# Patient Record
Sex: Female | Born: 1945 | Race: White | Hispanic: No | Marital: Single | Smoking: Never smoker
Health system: Southern US, Community
[De-identification: ages and names within clinical notes are randomized; demographics above are authoritative.]

## PROBLEM LIST (undated history)

## (undated) DIAGNOSIS — H5440 Blindness, one eye, unspecified eye: Secondary | ICD-10-CM

## (undated) DIAGNOSIS — Z973 Presence of spectacles and contact lenses: Secondary | ICD-10-CM

## (undated) DIAGNOSIS — J40 Bronchitis, not specified as acute or chronic: Secondary | ICD-10-CM

## (undated) DIAGNOSIS — E119 Type 2 diabetes mellitus without complications: Secondary | ICD-10-CM

## (undated) DIAGNOSIS — Z9889 Other specified postprocedural states: Secondary | ICD-10-CM

## (undated) DIAGNOSIS — Z9989 Dependence on other enabling machines and devices: Secondary | ICD-10-CM

## (undated) DIAGNOSIS — R112 Nausea with vomiting, unspecified: Secondary | ICD-10-CM

## (undated) DIAGNOSIS — H409 Unspecified glaucoma: Secondary | ICD-10-CM

## (undated) DIAGNOSIS — N3281 Overactive bladder: Secondary | ICD-10-CM

## (undated) DIAGNOSIS — Z87442 Personal history of urinary calculi: Secondary | ICD-10-CM

## (undated) DIAGNOSIS — G4733 Obstructive sleep apnea (adult) (pediatric): Secondary | ICD-10-CM

## (undated) DIAGNOSIS — K219 Gastro-esophageal reflux disease without esophagitis: Secondary | ICD-10-CM

## (undated) DIAGNOSIS — M810 Age-related osteoporosis without current pathological fracture: Secondary | ICD-10-CM

## (undated) DIAGNOSIS — I10 Essential (primary) hypertension: Secondary | ICD-10-CM

## (undated) DIAGNOSIS — Z8489 Family history of other specified conditions: Secondary | ICD-10-CM

## (undated) DIAGNOSIS — M1990 Unspecified osteoarthritis, unspecified site: Secondary | ICD-10-CM

## (undated) HISTORY — DX: Gastro-esophageal reflux disease without esophagitis: K21.9

## (undated) HISTORY — PX: CHOLECYSTECTOMY: SHX55

## (undated) HISTORY — DX: Essential (primary) hypertension: I10

## (undated) HISTORY — DX: Age-related osteoporosis without current pathological fracture: M81.0

## (undated) HISTORY — PX: TONSILLECTOMY: SUR1361

---

## 1990-04-02 DIAGNOSIS — H5440 Blindness, one eye, unspecified eye: Secondary | ICD-10-CM

## 1990-04-02 HISTORY — PX: EYE SURGERY: SHX253

## 1990-04-02 HISTORY — DX: Blindness, one eye, unspecified eye: H54.40

## 1997-11-19 ENCOUNTER — Encounter: Payer: Self-pay | Admitting: Family Medicine

## 1997-11-19 ENCOUNTER — Ambulatory Visit (HOSPITAL_COMMUNITY): Admission: RE | Admit: 1997-11-19 | Discharge: 1997-11-19 | Payer: Self-pay | Admitting: Family Medicine

## 1999-11-16 ENCOUNTER — Ambulatory Visit (HOSPITAL_COMMUNITY): Admission: RE | Admit: 1999-11-16 | Discharge: 1999-11-16 | Payer: Self-pay | Admitting: Family Medicine

## 1999-11-16 ENCOUNTER — Encounter: Payer: Self-pay | Admitting: Family Medicine

## 2001-03-03 ENCOUNTER — Encounter: Payer: Self-pay | Admitting: Family Medicine

## 2001-03-03 ENCOUNTER — Ambulatory Visit (HOSPITAL_COMMUNITY): Admission: RE | Admit: 2001-03-03 | Discharge: 2001-03-03 | Payer: Self-pay | Admitting: Family Medicine

## 2003-06-30 ENCOUNTER — Other Ambulatory Visit: Admission: RE | Admit: 2003-06-30 | Discharge: 2003-06-30 | Payer: Self-pay | Admitting: Family Medicine

## 2003-07-15 ENCOUNTER — Ambulatory Visit (HOSPITAL_COMMUNITY): Admission: RE | Admit: 2003-07-15 | Discharge: 2003-07-15 | Payer: Self-pay | Admitting: Family Medicine

## 2003-11-26 ENCOUNTER — Ambulatory Visit (HOSPITAL_COMMUNITY): Admission: RE | Admit: 2003-11-26 | Discharge: 2003-11-26 | Payer: Self-pay | Admitting: *Deleted

## 2003-11-26 ENCOUNTER — Encounter (INDEPENDENT_AMBULATORY_CARE_PROVIDER_SITE_OTHER): Payer: Self-pay | Admitting: Specialist

## 2005-07-10 ENCOUNTER — Other Ambulatory Visit: Admission: RE | Admit: 2005-07-10 | Discharge: 2005-07-10 | Payer: Self-pay | Admitting: Family Medicine

## 2005-08-23 ENCOUNTER — Ambulatory Visit (HOSPITAL_COMMUNITY): Admission: RE | Admit: 2005-08-23 | Discharge: 2005-08-23 | Payer: Self-pay | Admitting: Family Medicine

## 2006-01-03 ENCOUNTER — Encounter: Admission: RE | Admit: 2006-01-03 | Discharge: 2006-04-03 | Payer: Self-pay | Admitting: Family Medicine

## 2006-09-06 ENCOUNTER — Other Ambulatory Visit: Admission: RE | Admit: 2006-09-06 | Discharge: 2006-09-06 | Payer: Self-pay | Admitting: Family Medicine

## 2006-09-10 ENCOUNTER — Ambulatory Visit (HOSPITAL_COMMUNITY): Admission: RE | Admit: 2006-09-10 | Discharge: 2006-09-10 | Payer: Self-pay | Admitting: Family Medicine

## 2007-09-15 ENCOUNTER — Other Ambulatory Visit: Admission: RE | Admit: 2007-09-15 | Discharge: 2007-09-15 | Payer: Self-pay | Admitting: Family Medicine

## 2007-09-30 ENCOUNTER — Ambulatory Visit (HOSPITAL_COMMUNITY): Admission: RE | Admit: 2007-09-30 | Discharge: 2007-09-30 | Payer: Self-pay | Admitting: Family Medicine

## 2007-12-29 ENCOUNTER — Encounter: Admission: RE | Admit: 2007-12-29 | Discharge: 2008-03-01 | Payer: Self-pay | Admitting: Orthopedic Surgery

## 2008-11-24 ENCOUNTER — Ambulatory Visit (HOSPITAL_COMMUNITY): Admission: RE | Admit: 2008-11-24 | Discharge: 2008-11-24 | Payer: Self-pay | Admitting: Family Medicine

## 2010-01-05 ENCOUNTER — Ambulatory Visit (HOSPITAL_COMMUNITY): Admission: RE | Admit: 2010-01-05 | Discharge: 2010-01-05 | Payer: Self-pay | Admitting: Family Medicine

## 2010-03-17 ENCOUNTER — Encounter
Admission: RE | Admit: 2010-03-17 | Discharge: 2010-03-30 | Payer: Self-pay | Source: Home / Self Care | Attending: Family Medicine | Admitting: Family Medicine

## 2010-04-02 ENCOUNTER — Encounter
Admission: RE | Admit: 2010-04-02 | Discharge: 2010-05-02 | Payer: Self-pay | Source: Home / Self Care | Attending: Family Medicine | Admitting: Family Medicine

## 2010-04-17 ENCOUNTER — Encounter: Admit: 2010-04-17 | Payer: Self-pay | Admitting: Family Medicine

## 2010-09-22 ENCOUNTER — Emergency Department (HOSPITAL_COMMUNITY): Payer: No Typology Code available for payment source

## 2010-09-22 ENCOUNTER — Emergency Department (HOSPITAL_COMMUNITY)
Admission: EM | Admit: 2010-09-22 | Discharge: 2010-09-22 | Disposition: A | Discharge status: Home / Self Care | Payer: No Typology Code available for payment source | Attending: Emergency Medicine | Admitting: Emergency Medicine

## 2010-09-22 DIAGNOSIS — E7800 Pure hypercholesterolemia, unspecified: Secondary | ICD-10-CM | POA: Insufficient documentation

## 2010-09-22 DIAGNOSIS — W010XXA Fall on same level from slipping, tripping and stumbling without subsequent striking against object, initial encounter: Secondary | ICD-10-CM | POA: Insufficient documentation

## 2010-09-22 DIAGNOSIS — M25519 Pain in unspecified shoulder: Secondary | ICD-10-CM | POA: Insufficient documentation

## 2010-09-22 DIAGNOSIS — E119 Type 2 diabetes mellitus without complications: Secondary | ICD-10-CM | POA: Insufficient documentation

## 2010-09-22 DIAGNOSIS — S42209A Unspecified fracture of upper end of unspecified humerus, initial encounter for closed fracture: Secondary | ICD-10-CM | POA: Insufficient documentation

## 2010-09-22 DIAGNOSIS — Y9229 Other specified public building as the place of occurrence of the external cause: Secondary | ICD-10-CM | POA: Insufficient documentation

## 2010-11-06 ENCOUNTER — Ambulatory Visit: Payer: Medicare Other | Attending: Orthopedic Surgery | Admitting: Physical Therapy

## 2010-11-06 DIAGNOSIS — M25519 Pain in unspecified shoulder: Secondary | ICD-10-CM | POA: Insufficient documentation

## 2010-11-06 DIAGNOSIS — IMO0001 Reserved for inherently not codable concepts without codable children: Secondary | ICD-10-CM | POA: Insufficient documentation

## 2010-11-06 DIAGNOSIS — M25619 Stiffness of unspecified shoulder, not elsewhere classified: Secondary | ICD-10-CM | POA: Insufficient documentation

## 2010-11-14 ENCOUNTER — Encounter: Payer: MEDICARE | Admitting: Physical Therapy

## 2010-11-16 ENCOUNTER — Ambulatory Visit: Payer: Medicare Other | Admitting: Physical Therapy

## 2010-11-23 ENCOUNTER — Ambulatory Visit: Payer: Medicare Other | Admitting: Physical Therapy

## 2010-12-05 ENCOUNTER — Ambulatory Visit: Payer: No Typology Code available for payment source | Attending: Orthopedic Surgery | Admitting: Physical Therapy

## 2010-12-05 DIAGNOSIS — IMO0001 Reserved for inherently not codable concepts without codable children: Secondary | ICD-10-CM | POA: Insufficient documentation

## 2010-12-05 DIAGNOSIS — M25519 Pain in unspecified shoulder: Secondary | ICD-10-CM | POA: Insufficient documentation

## 2010-12-05 DIAGNOSIS — M25619 Stiffness of unspecified shoulder, not elsewhere classified: Secondary | ICD-10-CM | POA: Insufficient documentation

## 2010-12-07 ENCOUNTER — Ambulatory Visit: Payer: No Typology Code available for payment source | Admitting: Physical Therapy

## 2010-12-11 ENCOUNTER — Ambulatory Visit: Payer: No Typology Code available for payment source | Admitting: Physical Therapy

## 2010-12-13 ENCOUNTER — Ambulatory Visit: Payer: No Typology Code available for payment source | Admitting: Physical Therapy

## 2010-12-14 ENCOUNTER — Encounter: Payer: MEDICARE | Admitting: Physical Therapy

## 2010-12-15 ENCOUNTER — Ambulatory Visit: Payer: No Typology Code available for payment source | Admitting: Physical Therapy

## 2010-12-18 ENCOUNTER — Ambulatory Visit: Payer: No Typology Code available for payment source | Admitting: Physical Therapy

## 2010-12-19 ENCOUNTER — Encounter: Payer: MEDICARE | Admitting: Physical Therapy

## 2010-12-21 ENCOUNTER — Ambulatory Visit: Payer: No Typology Code available for payment source | Admitting: Physical Therapy

## 2010-12-25 ENCOUNTER — Ambulatory Visit: Payer: No Typology Code available for payment source | Admitting: Physical Therapy

## 2010-12-26 ENCOUNTER — Ambulatory Visit: Payer: No Typology Code available for payment source | Admitting: Physical Therapy

## 2010-12-28 ENCOUNTER — Ambulatory Visit: Payer: No Typology Code available for payment source | Admitting: Physical Therapy

## 2011-01-01 ENCOUNTER — Ambulatory Visit: Payer: Medicare Other | Attending: Orthopedic Surgery | Admitting: Physical Therapy

## 2011-01-01 DIAGNOSIS — IMO0001 Reserved for inherently not codable concepts without codable children: Secondary | ICD-10-CM | POA: Insufficient documentation

## 2011-01-01 DIAGNOSIS — M25619 Stiffness of unspecified shoulder, not elsewhere classified: Secondary | ICD-10-CM | POA: Insufficient documentation

## 2011-01-01 DIAGNOSIS — M25519 Pain in unspecified shoulder: Secondary | ICD-10-CM | POA: Insufficient documentation

## 2011-01-02 ENCOUNTER — Encounter: Payer: MEDICARE | Admitting: Physical Therapy

## 2011-01-08 ENCOUNTER — Ambulatory Visit: Payer: Medicare Other | Admitting: Physical Therapy

## 2011-01-10 ENCOUNTER — Ambulatory Visit: Payer: Medicare Other | Admitting: Physical Therapy

## 2011-01-12 ENCOUNTER — Ambulatory Visit: Payer: Medicare Other | Admitting: Physical Therapy

## 2011-01-15 ENCOUNTER — Ambulatory Visit: Payer: Medicare Other | Admitting: Physical Therapy

## 2011-01-17 ENCOUNTER — Ambulatory Visit: Payer: Medicare Other | Admitting: Physical Therapy

## 2011-01-18 ENCOUNTER — Ambulatory Visit: Payer: Medicare Other | Admitting: Physical Therapy

## 2011-01-22 ENCOUNTER — Ambulatory Visit: Payer: Medicare Other | Admitting: Physical Therapy

## 2011-01-24 ENCOUNTER — Ambulatory Visit: Payer: Medicare Other | Admitting: Physical Therapy

## 2011-01-25 ENCOUNTER — Ambulatory Visit: Payer: Medicare Other | Admitting: Physical Therapy

## 2011-02-04 DIAGNOSIS — H35101 Retinopathy of prematurity, unspecified, right eye: Secondary | ICD-10-CM | POA: Insufficient documentation

## 2011-02-04 DIAGNOSIS — Z970 Presence of artificial eye: Secondary | ICD-10-CM | POA: Insufficient documentation

## 2011-08-10 ENCOUNTER — Other Ambulatory Visit (HOSPITAL_COMMUNITY): Payer: Self-pay | Admitting: Family Medicine

## 2011-08-10 DIAGNOSIS — Z1231 Encounter for screening mammogram for malignant neoplasm of breast: Secondary | ICD-10-CM

## 2011-08-16 ENCOUNTER — Ambulatory Visit (HOSPITAL_COMMUNITY)
Admission: RE | Admit: 2011-08-16 | Discharge: 2011-08-16 | Disposition: A | Discharge status: Home / Self Care | Payer: Medicare Other | Source: Ambulatory Visit | Attending: Family Medicine | Admitting: Family Medicine

## 2011-08-16 DIAGNOSIS — Z1231 Encounter for screening mammogram for malignant neoplasm of breast: Secondary | ICD-10-CM

## 2012-05-25 ENCOUNTER — Ambulatory Visit (INDEPENDENT_AMBULATORY_CARE_PROVIDER_SITE_OTHER): Payer: Medicare Other | Admitting: Emergency Medicine

## 2012-05-25 VITALS — BP 127/80 | HR 109 | Temp 100.6°F | Resp 18 | Ht 62.0 in | Wt 163.8 lb

## 2012-05-25 DIAGNOSIS — J209 Acute bronchitis, unspecified: Secondary | ICD-10-CM

## 2012-05-25 MED ORDER — AZITHROMYCIN 250 MG PO TABS: ORAL_TABLET | ORAL | Status: DC

## 2012-05-25 MED ORDER — HYDROCOD POLST-CHLORPHEN POLST 10-8 MG/5ML PO LQCR: 5.0000 mL | Freq: Two times a day (BID) | ORAL | Status: DC | PRN

## 2012-05-25 NOTE — Patient Instructions (Signed)

## 2012-05-25 NOTE — Progress Notes (Signed)
[facility] [address], [place] [address] [phone]  Date:  05/25/2012   Name:  Lindsey Baldwin   DOB:  [birth year]   MRN:  [id number]  PCP:  No primary provider on file.    Chief Complaint: Cough and Fever   History of Present Illness:  Lindsey Baldwin is a 67 y.o. very pleasant female patient who presents with the following:  Ill for two weeks with a cough.  Productive scant mucoid sputum.  Started with a "cold" with temp to 102.6 and sore throat.  Myalgias, arthralgias and fatigue.  Earlier this week had several days of diarrhea.  No improvement with OTC medications.  No nausea or vomiting.    There is no problem list on file for this patient.   Past Medical History  Diagnosis Date  . Arthritis   . GERD (gastroesophageal reflux disease)   . Diabetes mellitus without complication   . Glaucoma   . Hypertension   . Osteoporosis     Past Surgical History  Procedure Laterality Date  . Eye surgery      History  Substance Use Topics  . Smoking status: Never Smoker   . Smokeless tobacco: Not on file  . Alcohol Use: No    Family History  Problem Relation Age of Onset  . Cancer Mother   . Cancer Father     Allergies  Allergen Reactions  . Penicillins Swelling  . Sulfa Antibiotics Rash    Medication list has been reviewed and updated.  No current outpatient prescriptions on file prior to visit.   No current facility-administered medications on file prior to visit.    Review of Systems:  As per HPI, otherwise negative.    Physical Examination: Filed Vitals:   05/25/12 1344  BP: 127/80  Pulse: 109  Temp: 100.6 F (38.1 C)  Resp: 18   Filed Vitals:   05/25/12 1344  Height: 5\' 2"  (1.575 m)  Weight: 163 lb 12.8 oz (74.299 kg)   Body mass index is 29.95 kg/(m^2). Ideal Body Weight: Weight in (lb) to have BMI = 25: 136.4  GEN: WDWN, NAD, Non-toxic, A & O x 3  No rash HEENT: Atraumatic, Normocephalic. Neck supple. No  masses, No LAD. Ears and Nose: No external deformity. CV: RRR, No M/G/R. No JVD. No thrill. No extra heart sounds. PULM: CTA B, no wheezes, crackles, rhonchi. No retractions. No resp. distress. No accessory muscle use. ABD: S, NT, ND, +BS. No rebound. No HSM. EXTR: No c/c/e NEURO Normal gait.  PSYCH: Normally interactive. Conversant. Not depressed or anxious appearing.  Calm demeanor.    Assessment and Plan: Bronchitis zpak tussionex   Carmelina Dane, MD

## 2012-11-12 ENCOUNTER — Other Ambulatory Visit (HOSPITAL_COMMUNITY): Payer: Self-pay | Admitting: Family Medicine

## 2012-11-12 DIAGNOSIS — Z1231 Encounter for screening mammogram for malignant neoplasm of breast: Secondary | ICD-10-CM

## 2012-11-18 ENCOUNTER — Ambulatory Visit (HOSPITAL_COMMUNITY)
Admission: RE | Admit: 2012-11-18 | Discharge: 2012-11-18 | Disposition: A | Discharge status: Home / Self Care | Payer: MEDICARE | Source: Ambulatory Visit | Attending: Family Medicine | Admitting: Family Medicine

## 2012-11-18 DIAGNOSIS — Z1231 Encounter for screening mammogram for malignant neoplasm of breast: Secondary | ICD-10-CM | POA: Insufficient documentation

## 2014-09-23 ENCOUNTER — Other Ambulatory Visit (HOSPITAL_COMMUNITY): Payer: Self-pay | Admitting: Family Medicine

## 2014-09-23 DIAGNOSIS — Z1231 Encounter for screening mammogram for malignant neoplasm of breast: Secondary | ICD-10-CM

## 2014-10-01 ENCOUNTER — Ambulatory Visit (HOSPITAL_COMMUNITY)
Admission: RE | Admit: 2014-10-01 | Discharge: 2014-10-01 | Disposition: A | Discharge status: Home / Self Care | Payer: Medicare Other | Source: Ambulatory Visit | Attending: Family Medicine | Admitting: Family Medicine

## 2014-10-01 DIAGNOSIS — Z1231 Encounter for screening mammogram for malignant neoplasm of breast: Secondary | ICD-10-CM

## 2015-01-18 ENCOUNTER — Other Ambulatory Visit (HOSPITAL_COMMUNITY)
Admission: RE | Admit: 2015-01-18 | Discharge: 2015-01-18 | Disposition: A | Discharge status: Home / Self Care | Payer: Medicare Other | Source: Ambulatory Visit | Attending: Family Medicine | Admitting: Family Medicine

## 2015-01-18 ENCOUNTER — Other Ambulatory Visit: Payer: Self-pay | Admitting: Family Medicine

## 2015-01-18 DIAGNOSIS — Z1151 Encounter for screening for human papillomavirus (HPV): Secondary | ICD-10-CM | POA: Insufficient documentation

## 2015-01-18 DIAGNOSIS — Z01419 Encounter for gynecological examination (general) (routine) without abnormal findings: Secondary | ICD-10-CM | POA: Insufficient documentation

## 2015-01-19 LAB — CYTOLOGY - PAP

## 2015-05-10 DIAGNOSIS — M5136 Other intervertebral disc degeneration, lumbar region: Secondary | ICD-10-CM | POA: Diagnosis not present

## 2015-05-16 DIAGNOSIS — L821 Other seborrheic keratosis: Secondary | ICD-10-CM | POA: Diagnosis not present

## 2015-05-16 DIAGNOSIS — D485 Neoplasm of uncertain behavior of skin: Secondary | ICD-10-CM | POA: Diagnosis not present

## 2015-05-16 DIAGNOSIS — L814 Other melanin hyperpigmentation: Secondary | ICD-10-CM | POA: Diagnosis not present

## 2015-05-16 DIAGNOSIS — D225 Melanocytic nevi of trunk: Secondary | ICD-10-CM | POA: Diagnosis not present

## 2015-05-16 DIAGNOSIS — D1801 Hemangioma of skin and subcutaneous tissue: Secondary | ICD-10-CM | POA: Diagnosis not present

## 2015-05-16 DIAGNOSIS — C44319 Basal cell carcinoma of skin of other parts of face: Secondary | ICD-10-CM | POA: Diagnosis not present

## 2015-05-16 DIAGNOSIS — Z85828 Personal history of other malignant neoplasm of skin: Secondary | ICD-10-CM | POA: Diagnosis not present

## 2015-05-17 DIAGNOSIS — J209 Acute bronchitis, unspecified: Secondary | ICD-10-CM | POA: Diagnosis not present

## 2015-07-19 DIAGNOSIS — Z7984 Long term (current) use of oral hypoglycemic drugs: Secondary | ICD-10-CM | POA: Diagnosis not present

## 2015-07-19 DIAGNOSIS — E7800 Pure hypercholesterolemia, unspecified: Secondary | ICD-10-CM | POA: Diagnosis not present

## 2015-07-19 DIAGNOSIS — E1140 Type 2 diabetes mellitus with diabetic neuropathy, unspecified: Secondary | ICD-10-CM | POA: Diagnosis not present

## 2015-08-30 DIAGNOSIS — C44319 Basal cell carcinoma of skin of other parts of face: Secondary | ICD-10-CM | POA: Diagnosis not present

## 2015-09-30 DIAGNOSIS — E1140 Type 2 diabetes mellitus with diabetic neuropathy, unspecified: Secondary | ICD-10-CM | POA: Diagnosis not present

## 2015-09-30 DIAGNOSIS — M1990 Unspecified osteoarthritis, unspecified site: Secondary | ICD-10-CM | POA: Diagnosis not present

## 2015-09-30 DIAGNOSIS — Z7984 Long term (current) use of oral hypoglycemic drugs: Secondary | ICD-10-CM | POA: Diagnosis not present

## 2015-09-30 DIAGNOSIS — E11319 Type 2 diabetes mellitus with unspecified diabetic retinopathy without macular edema: Secondary | ICD-10-CM | POA: Diagnosis not present

## 2015-09-30 DIAGNOSIS — R269 Unspecified abnormalities of gait and mobility: Secondary | ICD-10-CM | POA: Diagnosis not present

## 2015-10-11 DIAGNOSIS — M25562 Pain in left knee: Secondary | ICD-10-CM | POA: Diagnosis not present

## 2015-10-11 DIAGNOSIS — M25561 Pain in right knee: Secondary | ICD-10-CM | POA: Diagnosis not present

## 2015-10-11 DIAGNOSIS — M5136 Other intervertebral disc degeneration, lumbar region: Secondary | ICD-10-CM | POA: Diagnosis not present

## 2015-10-20 DIAGNOSIS — Z4420 Encounter for fitting and adjustment of artificial eye, unspecified: Secondary | ICD-10-CM | POA: Diagnosis not present

## 2015-10-31 DIAGNOSIS — H401113 Primary open-angle glaucoma, right eye, severe stage: Secondary | ICD-10-CM | POA: Diagnosis not present

## 2015-11-01 DIAGNOSIS — R262 Difficulty in walking, not elsewhere classified: Secondary | ICD-10-CM | POA: Diagnosis not present

## 2015-11-01 DIAGNOSIS — M7989 Other specified soft tissue disorders: Secondary | ICD-10-CM | POA: Diagnosis not present

## 2015-12-06 DIAGNOSIS — E1140 Type 2 diabetes mellitus with diabetic neuropathy, unspecified: Secondary | ICD-10-CM | POA: Diagnosis not present

## 2015-12-06 DIAGNOSIS — M1990 Unspecified osteoarthritis, unspecified site: Secondary | ICD-10-CM | POA: Diagnosis not present

## 2015-12-06 DIAGNOSIS — R269 Unspecified abnormalities of gait and mobility: Secondary | ICD-10-CM | POA: Diagnosis not present

## 2015-12-06 DIAGNOSIS — E11319 Type 2 diabetes mellitus with unspecified diabetic retinopathy without macular edema: Secondary | ICD-10-CM | POA: Diagnosis not present

## 2015-12-06 DIAGNOSIS — Z7982 Long term (current) use of aspirin: Secondary | ICD-10-CM | POA: Diagnosis not present

## 2015-12-16 DIAGNOSIS — R3915 Urgency of urination: Secondary | ICD-10-CM | POA: Diagnosis not present

## 2015-12-16 DIAGNOSIS — R351 Nocturia: Secondary | ICD-10-CM | POA: Diagnosis not present

## 2016-01-06 DIAGNOSIS — H401113 Primary open-angle glaucoma, right eye, severe stage: Secondary | ICD-10-CM | POA: Diagnosis not present

## 2016-01-09 DIAGNOSIS — M170 Bilateral primary osteoarthritis of knee: Secondary | ICD-10-CM | POA: Diagnosis not present

## 2016-01-09 DIAGNOSIS — R600 Localized edema: Secondary | ICD-10-CM | POA: Diagnosis not present

## 2016-01-09 DIAGNOSIS — R400 Somnolence: Secondary | ICD-10-CM | POA: Diagnosis not present

## 2016-01-09 DIAGNOSIS — R351 Nocturia: Secondary | ICD-10-CM | POA: Diagnosis not present

## 2016-01-09 DIAGNOSIS — Z23 Encounter for immunization: Secondary | ICD-10-CM | POA: Diagnosis not present

## 2016-01-23 DIAGNOSIS — G4733 Obstructive sleep apnea (adult) (pediatric): Secondary | ICD-10-CM | POA: Diagnosis not present

## 2016-01-24 DIAGNOSIS — E1165 Type 2 diabetes mellitus with hyperglycemia: Secondary | ICD-10-CM | POA: Diagnosis not present

## 2016-01-24 DIAGNOSIS — N3281 Overactive bladder: Secondary | ICD-10-CM | POA: Diagnosis not present

## 2016-01-24 DIAGNOSIS — K219 Gastro-esophageal reflux disease without esophagitis: Secondary | ICD-10-CM | POA: Diagnosis not present

## 2016-01-24 DIAGNOSIS — M810 Age-related osteoporosis without current pathological fracture: Secondary | ICD-10-CM | POA: Diagnosis not present

## 2016-01-24 DIAGNOSIS — E7800 Pure hypercholesterolemia, unspecified: Secondary | ICD-10-CM | POA: Diagnosis not present

## 2016-01-24 DIAGNOSIS — E1140 Type 2 diabetes mellitus with diabetic neuropathy, unspecified: Secondary | ICD-10-CM | POA: Diagnosis not present

## 2016-01-24 DIAGNOSIS — Z1389 Encounter for screening for other disorder: Secondary | ICD-10-CM | POA: Diagnosis not present

## 2016-01-24 DIAGNOSIS — R600 Localized edema: Secondary | ICD-10-CM | POA: Diagnosis not present

## 2016-01-24 DIAGNOSIS — Z0000 Encounter for general adult medical examination without abnormal findings: Secondary | ICD-10-CM | POA: Diagnosis not present

## 2016-01-28 DIAGNOSIS — I509 Heart failure, unspecified: Secondary | ICD-10-CM | POA: Diagnosis not present

## 2016-01-28 DIAGNOSIS — M1991 Primary osteoarthritis, unspecified site: Secondary | ICD-10-CM | POA: Diagnosis not present

## 2016-01-28 DIAGNOSIS — I110 Hypertensive heart disease with heart failure: Secondary | ICD-10-CM | POA: Diagnosis not present

## 2016-01-28 DIAGNOSIS — M810 Age-related osteoporosis without current pathological fracture: Secondary | ICD-10-CM | POA: Diagnosis not present

## 2016-01-31 DIAGNOSIS — E1140 Type 2 diabetes mellitus with diabetic neuropathy, unspecified: Secondary | ICD-10-CM | POA: Diagnosis not present

## 2016-01-31 DIAGNOSIS — I509 Heart failure, unspecified: Secondary | ICD-10-CM | POA: Diagnosis not present

## 2016-01-31 DIAGNOSIS — M1991 Primary osteoarthritis, unspecified site: Secondary | ICD-10-CM | POA: Diagnosis not present

## 2016-01-31 DIAGNOSIS — M810 Age-related osteoporosis without current pathological fracture: Secondary | ICD-10-CM | POA: Diagnosis not present

## 2016-01-31 DIAGNOSIS — I110 Hypertensive heart disease with heart failure: Secondary | ICD-10-CM | POA: Diagnosis not present

## 2016-02-11 DIAGNOSIS — G4733 Obstructive sleep apnea (adult) (pediatric): Secondary | ICD-10-CM | POA: Diagnosis not present

## 2016-02-24 DIAGNOSIS — M170 Bilateral primary osteoarthritis of knee: Secondary | ICD-10-CM | POA: Diagnosis not present

## 2016-02-24 DIAGNOSIS — R262 Difficulty in walking, not elsewhere classified: Secondary | ICD-10-CM | POA: Diagnosis not present

## 2016-03-05 DIAGNOSIS — H401113 Primary open-angle glaucoma, right eye, severe stage: Secondary | ICD-10-CM | POA: Diagnosis not present

## 2016-03-05 DIAGNOSIS — Z970 Presence of artificial eye: Secondary | ICD-10-CM | POA: Diagnosis not present

## 2016-03-12 DIAGNOSIS — G4733 Obstructive sleep apnea (adult) (pediatric): Secondary | ICD-10-CM | POA: Diagnosis not present

## 2016-03-13 DIAGNOSIS — Z4420 Encounter for fitting and adjustment of artificial eye, unspecified: Secondary | ICD-10-CM | POA: Diagnosis not present

## 2016-04-06 DIAGNOSIS — R351 Nocturia: Secondary | ICD-10-CM | POA: Diagnosis not present

## 2016-04-06 DIAGNOSIS — R3915 Urgency of urination: Secondary | ICD-10-CM | POA: Diagnosis not present

## 2016-04-12 DIAGNOSIS — G4733 Obstructive sleep apnea (adult) (pediatric): Secondary | ICD-10-CM | POA: Diagnosis not present

## 2016-05-08 DIAGNOSIS — H02834 Dermatochalasis of left upper eyelid: Secondary | ICD-10-CM | POA: Diagnosis not present

## 2016-05-08 DIAGNOSIS — H53481 Generalized contraction of visual field, right eye: Secondary | ICD-10-CM | POA: Diagnosis not present

## 2016-05-08 DIAGNOSIS — H02849 Edema of unspecified eye, unspecified eyelid: Secondary | ICD-10-CM | POA: Insufficient documentation

## 2016-05-08 DIAGNOSIS — H02409 Unspecified ptosis of unspecified eyelid: Secondary | ICD-10-CM | POA: Insufficient documentation

## 2016-05-08 DIAGNOSIS — H02831 Dermatochalasis of right upper eyelid: Secondary | ICD-10-CM | POA: Diagnosis not present

## 2016-05-08 DIAGNOSIS — H02413 Mechanical ptosis of bilateral eyelids: Secondary | ICD-10-CM | POA: Diagnosis not present

## 2016-06-04 DIAGNOSIS — K5900 Constipation, unspecified: Secondary | ICD-10-CM | POA: Diagnosis not present

## 2016-06-04 DIAGNOSIS — K602 Anal fissure, unspecified: Secondary | ICD-10-CM | POA: Diagnosis not present

## 2016-06-04 DIAGNOSIS — K625 Hemorrhage of anus and rectum: Secondary | ICD-10-CM | POA: Diagnosis not present

## 2016-06-10 DIAGNOSIS — G4733 Obstructive sleep apnea (adult) (pediatric): Secondary | ICD-10-CM | POA: Diagnosis not present

## 2016-07-03 DIAGNOSIS — H02423 Myogenic ptosis of bilateral eyelids: Secondary | ICD-10-CM | POA: Diagnosis not present

## 2016-07-03 DIAGNOSIS — H02413 Mechanical ptosis of bilateral eyelids: Secondary | ICD-10-CM | POA: Diagnosis not present

## 2016-07-24 DIAGNOSIS — E1140 Type 2 diabetes mellitus with diabetic neuropathy, unspecified: Secondary | ICD-10-CM | POA: Diagnosis not present

## 2016-07-24 DIAGNOSIS — R600 Localized edema: Secondary | ICD-10-CM | POA: Diagnosis not present

## 2016-07-24 DIAGNOSIS — E7800 Pure hypercholesterolemia, unspecified: Secondary | ICD-10-CM | POA: Diagnosis not present

## 2016-08-02 DIAGNOSIS — B078 Other viral warts: Secondary | ICD-10-CM | POA: Diagnosis not present

## 2016-08-13 DIAGNOSIS — H401113 Primary open-angle glaucoma, right eye, severe stage: Secondary | ICD-10-CM | POA: Diagnosis not present

## 2016-08-13 DIAGNOSIS — H35101 Retinopathy of prematurity, unspecified, right eye: Secondary | ICD-10-CM | POA: Diagnosis not present

## 2016-09-07 DIAGNOSIS — G4733 Obstructive sleep apnea (adult) (pediatric): Secondary | ICD-10-CM | POA: Diagnosis not present

## 2016-09-13 DIAGNOSIS — G8929 Other chronic pain: Secondary | ICD-10-CM | POA: Diagnosis not present

## 2016-09-13 DIAGNOSIS — M25561 Pain in right knee: Secondary | ICD-10-CM | POA: Diagnosis not present

## 2016-09-13 DIAGNOSIS — M25562 Pain in left knee: Secondary | ICD-10-CM | POA: Diagnosis not present

## 2016-09-19 DIAGNOSIS — M6281 Muscle weakness (generalized): Secondary | ICD-10-CM | POA: Diagnosis not present

## 2016-09-19 DIAGNOSIS — N3946 Mixed incontinence: Secondary | ICD-10-CM | POA: Diagnosis not present

## 2016-09-19 DIAGNOSIS — M25511 Pain in right shoulder: Secondary | ICD-10-CM | POA: Diagnosis not present

## 2016-09-19 DIAGNOSIS — R2689 Other abnormalities of gait and mobility: Secondary | ICD-10-CM | POA: Diagnosis not present

## 2016-09-20 DIAGNOSIS — R2689 Other abnormalities of gait and mobility: Secondary | ICD-10-CM | POA: Diagnosis not present

## 2016-09-20 DIAGNOSIS — M25511 Pain in right shoulder: Secondary | ICD-10-CM | POA: Diagnosis not present

## 2016-09-20 DIAGNOSIS — M6281 Muscle weakness (generalized): Secondary | ICD-10-CM | POA: Diagnosis not present

## 2016-09-20 DIAGNOSIS — N3946 Mixed incontinence: Secondary | ICD-10-CM | POA: Diagnosis not present

## 2016-09-24 DIAGNOSIS — M25511 Pain in right shoulder: Secondary | ICD-10-CM | POA: Diagnosis not present

## 2016-09-24 DIAGNOSIS — M6281 Muscle weakness (generalized): Secondary | ICD-10-CM | POA: Diagnosis not present

## 2016-09-24 DIAGNOSIS — N3946 Mixed incontinence: Secondary | ICD-10-CM | POA: Diagnosis not present

## 2016-09-24 DIAGNOSIS — R2689 Other abnormalities of gait and mobility: Secondary | ICD-10-CM | POA: Diagnosis not present

## 2016-09-25 DIAGNOSIS — M6281 Muscle weakness (generalized): Secondary | ICD-10-CM | POA: Diagnosis not present

## 2016-09-25 DIAGNOSIS — N3946 Mixed incontinence: Secondary | ICD-10-CM | POA: Diagnosis not present

## 2016-09-25 DIAGNOSIS — M25511 Pain in right shoulder: Secondary | ICD-10-CM | POA: Diagnosis not present

## 2016-09-25 DIAGNOSIS — R2689 Other abnormalities of gait and mobility: Secondary | ICD-10-CM | POA: Diagnosis not present

## 2016-09-26 DIAGNOSIS — M6281 Muscle weakness (generalized): Secondary | ICD-10-CM | POA: Diagnosis not present

## 2016-09-26 DIAGNOSIS — N3946 Mixed incontinence: Secondary | ICD-10-CM | POA: Diagnosis not present

## 2016-09-26 DIAGNOSIS — M25511 Pain in right shoulder: Secondary | ICD-10-CM | POA: Diagnosis not present

## 2016-09-26 DIAGNOSIS — R2689 Other abnormalities of gait and mobility: Secondary | ICD-10-CM | POA: Diagnosis not present

## 2016-09-27 DIAGNOSIS — N3946 Mixed incontinence: Secondary | ICD-10-CM | POA: Diagnosis not present

## 2016-09-27 DIAGNOSIS — M6281 Muscle weakness (generalized): Secondary | ICD-10-CM | POA: Diagnosis not present

## 2016-09-27 DIAGNOSIS — R2689 Other abnormalities of gait and mobility: Secondary | ICD-10-CM | POA: Diagnosis not present

## 2016-09-27 DIAGNOSIS — M25511 Pain in right shoulder: Secondary | ICD-10-CM | POA: Diagnosis not present

## 2016-09-28 DIAGNOSIS — N3946 Mixed incontinence: Secondary | ICD-10-CM | POA: Diagnosis not present

## 2016-09-28 DIAGNOSIS — R2689 Other abnormalities of gait and mobility: Secondary | ICD-10-CM | POA: Diagnosis not present

## 2016-09-28 DIAGNOSIS — M6281 Muscle weakness (generalized): Secondary | ICD-10-CM | POA: Diagnosis not present

## 2016-09-28 DIAGNOSIS — M25511 Pain in right shoulder: Secondary | ICD-10-CM | POA: Diagnosis not present

## 2016-10-01 DIAGNOSIS — M25511 Pain in right shoulder: Secondary | ICD-10-CM | POA: Diagnosis not present

## 2016-10-01 DIAGNOSIS — R2689 Other abnormalities of gait and mobility: Secondary | ICD-10-CM | POA: Diagnosis not present

## 2016-10-01 DIAGNOSIS — M6281 Muscle weakness (generalized): Secondary | ICD-10-CM | POA: Diagnosis not present

## 2016-10-01 DIAGNOSIS — N3946 Mixed incontinence: Secondary | ICD-10-CM | POA: Diagnosis not present

## 2016-10-02 DIAGNOSIS — M25511 Pain in right shoulder: Secondary | ICD-10-CM | POA: Diagnosis not present

## 2016-10-02 DIAGNOSIS — N3946 Mixed incontinence: Secondary | ICD-10-CM | POA: Diagnosis not present

## 2016-10-02 DIAGNOSIS — R2689 Other abnormalities of gait and mobility: Secondary | ICD-10-CM | POA: Diagnosis not present

## 2016-10-02 DIAGNOSIS — M6281 Muscle weakness (generalized): Secondary | ICD-10-CM | POA: Diagnosis not present

## 2016-10-04 DIAGNOSIS — M6281 Muscle weakness (generalized): Secondary | ICD-10-CM | POA: Diagnosis not present

## 2016-10-04 DIAGNOSIS — M25511 Pain in right shoulder: Secondary | ICD-10-CM | POA: Diagnosis not present

## 2016-10-04 DIAGNOSIS — N3946 Mixed incontinence: Secondary | ICD-10-CM | POA: Diagnosis not present

## 2016-10-04 DIAGNOSIS — R2689 Other abnormalities of gait and mobility: Secondary | ICD-10-CM | POA: Diagnosis not present

## 2016-10-05 DIAGNOSIS — M25511 Pain in right shoulder: Secondary | ICD-10-CM | POA: Diagnosis not present

## 2016-10-05 DIAGNOSIS — M6281 Muscle weakness (generalized): Secondary | ICD-10-CM | POA: Diagnosis not present

## 2016-10-05 DIAGNOSIS — N3946 Mixed incontinence: Secondary | ICD-10-CM | POA: Diagnosis not present

## 2016-10-05 DIAGNOSIS — R2689 Other abnormalities of gait and mobility: Secondary | ICD-10-CM | POA: Diagnosis not present

## 2016-10-08 DIAGNOSIS — R2689 Other abnormalities of gait and mobility: Secondary | ICD-10-CM | POA: Diagnosis not present

## 2016-10-08 DIAGNOSIS — M25511 Pain in right shoulder: Secondary | ICD-10-CM | POA: Diagnosis not present

## 2016-10-08 DIAGNOSIS — M6281 Muscle weakness (generalized): Secondary | ICD-10-CM | POA: Diagnosis not present

## 2016-10-08 DIAGNOSIS — N3946 Mixed incontinence: Secondary | ICD-10-CM | POA: Diagnosis not present

## 2016-10-10 DIAGNOSIS — R2689 Other abnormalities of gait and mobility: Secondary | ICD-10-CM | POA: Diagnosis not present

## 2016-10-10 DIAGNOSIS — M6281 Muscle weakness (generalized): Secondary | ICD-10-CM | POA: Diagnosis not present

## 2016-10-10 DIAGNOSIS — N3946 Mixed incontinence: Secondary | ICD-10-CM | POA: Diagnosis not present

## 2016-10-10 DIAGNOSIS — M25511 Pain in right shoulder: Secondary | ICD-10-CM | POA: Diagnosis not present

## 2016-10-12 DIAGNOSIS — M6281 Muscle weakness (generalized): Secondary | ICD-10-CM | POA: Diagnosis not present

## 2016-10-12 DIAGNOSIS — M25511 Pain in right shoulder: Secondary | ICD-10-CM | POA: Diagnosis not present

## 2016-10-12 DIAGNOSIS — R2689 Other abnormalities of gait and mobility: Secondary | ICD-10-CM | POA: Diagnosis not present

## 2016-10-12 DIAGNOSIS — N3946 Mixed incontinence: Secondary | ICD-10-CM | POA: Diagnosis not present

## 2016-10-15 DIAGNOSIS — N3946 Mixed incontinence: Secondary | ICD-10-CM | POA: Diagnosis not present

## 2016-10-15 DIAGNOSIS — M25511 Pain in right shoulder: Secondary | ICD-10-CM | POA: Diagnosis not present

## 2016-10-15 DIAGNOSIS — M6281 Muscle weakness (generalized): Secondary | ICD-10-CM | POA: Diagnosis not present

## 2016-10-15 DIAGNOSIS — R2689 Other abnormalities of gait and mobility: Secondary | ICD-10-CM | POA: Diagnosis not present

## 2016-10-16 DIAGNOSIS — M6281 Muscle weakness (generalized): Secondary | ICD-10-CM | POA: Diagnosis not present

## 2016-10-16 DIAGNOSIS — M25511 Pain in right shoulder: Secondary | ICD-10-CM | POA: Diagnosis not present

## 2016-10-16 DIAGNOSIS — N3946 Mixed incontinence: Secondary | ICD-10-CM | POA: Diagnosis not present

## 2016-10-16 DIAGNOSIS — R2689 Other abnormalities of gait and mobility: Secondary | ICD-10-CM | POA: Diagnosis not present

## 2016-10-17 DIAGNOSIS — N3946 Mixed incontinence: Secondary | ICD-10-CM | POA: Diagnosis not present

## 2016-10-17 DIAGNOSIS — R2689 Other abnormalities of gait and mobility: Secondary | ICD-10-CM | POA: Diagnosis not present

## 2016-10-17 DIAGNOSIS — M25511 Pain in right shoulder: Secondary | ICD-10-CM | POA: Diagnosis not present

## 2016-10-17 DIAGNOSIS — M6281 Muscle weakness (generalized): Secondary | ICD-10-CM | POA: Diagnosis not present

## 2016-10-18 DIAGNOSIS — R2689 Other abnormalities of gait and mobility: Secondary | ICD-10-CM | POA: Diagnosis not present

## 2016-10-18 DIAGNOSIS — M25511 Pain in right shoulder: Secondary | ICD-10-CM | POA: Diagnosis not present

## 2016-10-18 DIAGNOSIS — N3946 Mixed incontinence: Secondary | ICD-10-CM | POA: Diagnosis not present

## 2016-10-18 DIAGNOSIS — M6281 Muscle weakness (generalized): Secondary | ICD-10-CM | POA: Diagnosis not present

## 2016-10-19 DIAGNOSIS — N3946 Mixed incontinence: Secondary | ICD-10-CM | POA: Diagnosis not present

## 2016-10-19 DIAGNOSIS — R2689 Other abnormalities of gait and mobility: Secondary | ICD-10-CM | POA: Diagnosis not present

## 2016-10-19 DIAGNOSIS — M6281 Muscle weakness (generalized): Secondary | ICD-10-CM | POA: Diagnosis not present

## 2016-10-19 DIAGNOSIS — M25511 Pain in right shoulder: Secondary | ICD-10-CM | POA: Diagnosis not present

## 2016-10-22 DIAGNOSIS — N3946 Mixed incontinence: Secondary | ICD-10-CM | POA: Diagnosis not present

## 2016-10-22 DIAGNOSIS — M25511 Pain in right shoulder: Secondary | ICD-10-CM | POA: Diagnosis not present

## 2016-10-22 DIAGNOSIS — R2689 Other abnormalities of gait and mobility: Secondary | ICD-10-CM | POA: Diagnosis not present

## 2016-10-22 DIAGNOSIS — M6281 Muscle weakness (generalized): Secondary | ICD-10-CM | POA: Diagnosis not present

## 2016-10-23 DIAGNOSIS — R2689 Other abnormalities of gait and mobility: Secondary | ICD-10-CM | POA: Diagnosis not present

## 2016-10-23 DIAGNOSIS — M6281 Muscle weakness (generalized): Secondary | ICD-10-CM | POA: Diagnosis not present

## 2016-10-23 DIAGNOSIS — M25511 Pain in right shoulder: Secondary | ICD-10-CM | POA: Diagnosis not present

## 2016-10-23 DIAGNOSIS — N3946 Mixed incontinence: Secondary | ICD-10-CM | POA: Diagnosis not present

## 2016-10-24 DIAGNOSIS — M6281 Muscle weakness (generalized): Secondary | ICD-10-CM | POA: Diagnosis not present

## 2016-10-24 DIAGNOSIS — M25511 Pain in right shoulder: Secondary | ICD-10-CM | POA: Diagnosis not present

## 2016-10-24 DIAGNOSIS — R2689 Other abnormalities of gait and mobility: Secondary | ICD-10-CM | POA: Diagnosis not present

## 2016-10-24 DIAGNOSIS — N3946 Mixed incontinence: Secondary | ICD-10-CM | POA: Diagnosis not present

## 2016-10-25 DIAGNOSIS — N3946 Mixed incontinence: Secondary | ICD-10-CM | POA: Diagnosis not present

## 2016-10-25 DIAGNOSIS — R2689 Other abnormalities of gait and mobility: Secondary | ICD-10-CM | POA: Diagnosis not present

## 2016-10-25 DIAGNOSIS — M25511 Pain in right shoulder: Secondary | ICD-10-CM | POA: Diagnosis not present

## 2016-10-25 DIAGNOSIS — M6281 Muscle weakness (generalized): Secondary | ICD-10-CM | POA: Diagnosis not present

## 2016-10-29 DIAGNOSIS — N3946 Mixed incontinence: Secondary | ICD-10-CM | POA: Diagnosis not present

## 2016-10-29 DIAGNOSIS — M6281 Muscle weakness (generalized): Secondary | ICD-10-CM | POA: Diagnosis not present

## 2016-10-29 DIAGNOSIS — R2689 Other abnormalities of gait and mobility: Secondary | ICD-10-CM | POA: Diagnosis not present

## 2016-10-29 DIAGNOSIS — M25511 Pain in right shoulder: Secondary | ICD-10-CM | POA: Diagnosis not present

## 2016-10-30 DIAGNOSIS — M25511 Pain in right shoulder: Secondary | ICD-10-CM | POA: Diagnosis not present

## 2016-10-30 DIAGNOSIS — R2689 Other abnormalities of gait and mobility: Secondary | ICD-10-CM | POA: Diagnosis not present

## 2016-10-30 DIAGNOSIS — N3946 Mixed incontinence: Secondary | ICD-10-CM | POA: Diagnosis not present

## 2016-10-30 DIAGNOSIS — M6281 Muscle weakness (generalized): Secondary | ICD-10-CM | POA: Diagnosis not present

## 2016-10-31 DIAGNOSIS — M6281 Muscle weakness (generalized): Secondary | ICD-10-CM | POA: Diagnosis not present

## 2016-10-31 DIAGNOSIS — R2689 Other abnormalities of gait and mobility: Secondary | ICD-10-CM | POA: Diagnosis not present

## 2016-10-31 DIAGNOSIS — M25511 Pain in right shoulder: Secondary | ICD-10-CM | POA: Diagnosis not present

## 2016-10-31 DIAGNOSIS — N3946 Mixed incontinence: Secondary | ICD-10-CM | POA: Diagnosis not present

## 2016-11-05 DIAGNOSIS — R2689 Other abnormalities of gait and mobility: Secondary | ICD-10-CM | POA: Diagnosis not present

## 2016-11-05 DIAGNOSIS — N3946 Mixed incontinence: Secondary | ICD-10-CM | POA: Diagnosis not present

## 2016-11-05 DIAGNOSIS — M25511 Pain in right shoulder: Secondary | ICD-10-CM | POA: Diagnosis not present

## 2016-11-05 DIAGNOSIS — M6281 Muscle weakness (generalized): Secondary | ICD-10-CM | POA: Diagnosis not present

## 2016-11-06 DIAGNOSIS — Z7984 Long term (current) use of oral hypoglycemic drugs: Secondary | ICD-10-CM | POA: Diagnosis not present

## 2016-11-06 DIAGNOSIS — M25511 Pain in right shoulder: Secondary | ICD-10-CM | POA: Diagnosis not present

## 2016-11-06 DIAGNOSIS — R2689 Other abnormalities of gait and mobility: Secondary | ICD-10-CM | POA: Diagnosis not present

## 2016-11-06 DIAGNOSIS — E1165 Type 2 diabetes mellitus with hyperglycemia: Secondary | ICD-10-CM | POA: Diagnosis not present

## 2016-11-06 DIAGNOSIS — M6281 Muscle weakness (generalized): Secondary | ICD-10-CM | POA: Diagnosis not present

## 2016-11-06 DIAGNOSIS — N3946 Mixed incontinence: Secondary | ICD-10-CM | POA: Diagnosis not present

## 2016-11-06 DIAGNOSIS — Z1389 Encounter for screening for other disorder: Secondary | ICD-10-CM | POA: Diagnosis not present

## 2016-11-08 DIAGNOSIS — R3915 Urgency of urination: Secondary | ICD-10-CM | POA: Diagnosis not present

## 2016-11-08 DIAGNOSIS — R351 Nocturia: Secondary | ICD-10-CM | POA: Diagnosis not present

## 2016-11-09 DIAGNOSIS — R2689 Other abnormalities of gait and mobility: Secondary | ICD-10-CM | POA: Diagnosis not present

## 2016-11-09 DIAGNOSIS — M25511 Pain in right shoulder: Secondary | ICD-10-CM | POA: Diagnosis not present

## 2016-11-09 DIAGNOSIS — M6281 Muscle weakness (generalized): Secondary | ICD-10-CM | POA: Diagnosis not present

## 2016-11-09 DIAGNOSIS — N3946 Mixed incontinence: Secondary | ICD-10-CM | POA: Diagnosis not present

## 2016-11-19 DIAGNOSIS — N3946 Mixed incontinence: Secondary | ICD-10-CM | POA: Diagnosis not present

## 2016-11-19 DIAGNOSIS — M6281 Muscle weakness (generalized): Secondary | ICD-10-CM | POA: Diagnosis not present

## 2016-11-19 DIAGNOSIS — M25511 Pain in right shoulder: Secondary | ICD-10-CM | POA: Diagnosis not present

## 2016-11-19 DIAGNOSIS — R2689 Other abnormalities of gait and mobility: Secondary | ICD-10-CM | POA: Diagnosis not present

## 2016-11-20 DIAGNOSIS — N3946 Mixed incontinence: Secondary | ICD-10-CM | POA: Diagnosis not present

## 2016-11-20 DIAGNOSIS — M25511 Pain in right shoulder: Secondary | ICD-10-CM | POA: Diagnosis not present

## 2016-11-20 DIAGNOSIS — M6281 Muscle weakness (generalized): Secondary | ICD-10-CM | POA: Diagnosis not present

## 2016-11-20 DIAGNOSIS — R2689 Other abnormalities of gait and mobility: Secondary | ICD-10-CM | POA: Diagnosis not present

## 2016-11-21 DIAGNOSIS — M25511 Pain in right shoulder: Secondary | ICD-10-CM | POA: Diagnosis not present

## 2016-11-21 DIAGNOSIS — M6281 Muscle weakness (generalized): Secondary | ICD-10-CM | POA: Diagnosis not present

## 2016-11-21 DIAGNOSIS — N3946 Mixed incontinence: Secondary | ICD-10-CM | POA: Diagnosis not present

## 2016-11-21 DIAGNOSIS — R2689 Other abnormalities of gait and mobility: Secondary | ICD-10-CM | POA: Diagnosis not present

## 2016-11-22 DIAGNOSIS — N3946 Mixed incontinence: Secondary | ICD-10-CM | POA: Diagnosis not present

## 2016-11-22 DIAGNOSIS — M6281 Muscle weakness (generalized): Secondary | ICD-10-CM | POA: Diagnosis not present

## 2016-11-22 DIAGNOSIS — R2689 Other abnormalities of gait and mobility: Secondary | ICD-10-CM | POA: Diagnosis not present

## 2016-11-22 DIAGNOSIS — M25511 Pain in right shoulder: Secondary | ICD-10-CM | POA: Diagnosis not present

## 2016-11-23 DIAGNOSIS — N3946 Mixed incontinence: Secondary | ICD-10-CM | POA: Diagnosis not present

## 2016-11-23 DIAGNOSIS — M25511 Pain in right shoulder: Secondary | ICD-10-CM | POA: Diagnosis not present

## 2016-11-23 DIAGNOSIS — R2689 Other abnormalities of gait and mobility: Secondary | ICD-10-CM | POA: Diagnosis not present

## 2016-11-23 DIAGNOSIS — M6281 Muscle weakness (generalized): Secondary | ICD-10-CM | POA: Diagnosis not present

## 2016-11-26 DIAGNOSIS — N3946 Mixed incontinence: Secondary | ICD-10-CM | POA: Diagnosis not present

## 2016-11-26 DIAGNOSIS — R2689 Other abnormalities of gait and mobility: Secondary | ICD-10-CM | POA: Diagnosis not present

## 2016-11-26 DIAGNOSIS — M6281 Muscle weakness (generalized): Secondary | ICD-10-CM | POA: Diagnosis not present

## 2016-11-26 DIAGNOSIS — M25511 Pain in right shoulder: Secondary | ICD-10-CM | POA: Diagnosis not present

## 2016-11-28 DIAGNOSIS — N3946 Mixed incontinence: Secondary | ICD-10-CM | POA: Diagnosis not present

## 2016-11-28 DIAGNOSIS — R2689 Other abnormalities of gait and mobility: Secondary | ICD-10-CM | POA: Diagnosis not present

## 2016-11-28 DIAGNOSIS — M6281 Muscle weakness (generalized): Secondary | ICD-10-CM | POA: Diagnosis not present

## 2016-11-28 DIAGNOSIS — M25511 Pain in right shoulder: Secondary | ICD-10-CM | POA: Diagnosis not present

## 2016-11-29 DIAGNOSIS — M25511 Pain in right shoulder: Secondary | ICD-10-CM | POA: Diagnosis not present

## 2016-11-29 DIAGNOSIS — N3946 Mixed incontinence: Secondary | ICD-10-CM | POA: Diagnosis not present

## 2016-11-29 DIAGNOSIS — R2689 Other abnormalities of gait and mobility: Secondary | ICD-10-CM | POA: Diagnosis not present

## 2016-11-29 DIAGNOSIS — M6281 Muscle weakness (generalized): Secondary | ICD-10-CM | POA: Diagnosis not present

## 2016-12-04 DIAGNOSIS — M6281 Muscle weakness (generalized): Secondary | ICD-10-CM | POA: Diagnosis not present

## 2016-12-04 DIAGNOSIS — M25511 Pain in right shoulder: Secondary | ICD-10-CM | POA: Diagnosis not present

## 2016-12-04 DIAGNOSIS — N3946 Mixed incontinence: Secondary | ICD-10-CM | POA: Diagnosis not present

## 2016-12-04 DIAGNOSIS — R2689 Other abnormalities of gait and mobility: Secondary | ICD-10-CM | POA: Diagnosis not present

## 2016-12-10 DIAGNOSIS — M25511 Pain in right shoulder: Secondary | ICD-10-CM | POA: Diagnosis not present

## 2016-12-10 DIAGNOSIS — M6281 Muscle weakness (generalized): Secondary | ICD-10-CM | POA: Diagnosis not present

## 2016-12-10 DIAGNOSIS — R2689 Other abnormalities of gait and mobility: Secondary | ICD-10-CM | POA: Diagnosis not present

## 2016-12-10 DIAGNOSIS — N3946 Mixed incontinence: Secondary | ICD-10-CM | POA: Diagnosis not present

## 2016-12-12 DIAGNOSIS — N3946 Mixed incontinence: Secondary | ICD-10-CM | POA: Diagnosis not present

## 2016-12-12 DIAGNOSIS — R2689 Other abnormalities of gait and mobility: Secondary | ICD-10-CM | POA: Diagnosis not present

## 2016-12-12 DIAGNOSIS — M25511 Pain in right shoulder: Secondary | ICD-10-CM | POA: Diagnosis not present

## 2016-12-12 DIAGNOSIS — M6281 Muscle weakness (generalized): Secondary | ICD-10-CM | POA: Diagnosis not present

## 2016-12-17 DIAGNOSIS — H35101 Retinopathy of prematurity, unspecified, right eye: Secondary | ICD-10-CM | POA: Diagnosis not present

## 2016-12-17 DIAGNOSIS — H401113 Primary open-angle glaucoma, right eye, severe stage: Secondary | ICD-10-CM | POA: Diagnosis not present

## 2016-12-20 DIAGNOSIS — M25561 Pain in right knee: Secondary | ICD-10-CM | POA: Diagnosis not present

## 2016-12-20 DIAGNOSIS — G8929 Other chronic pain: Secondary | ICD-10-CM | POA: Diagnosis not present

## 2016-12-20 DIAGNOSIS — M1712 Unilateral primary osteoarthritis, left knee: Secondary | ICD-10-CM | POA: Diagnosis not present

## 2016-12-20 DIAGNOSIS — M1711 Unilateral primary osteoarthritis, right knee: Secondary | ICD-10-CM | POA: Diagnosis not present

## 2017-01-29 DIAGNOSIS — M810 Age-related osteoporosis without current pathological fracture: Secondary | ICD-10-CM | POA: Diagnosis not present

## 2017-01-29 DIAGNOSIS — Z0000 Encounter for general adult medical examination without abnormal findings: Secondary | ICD-10-CM | POA: Diagnosis not present

## 2017-01-29 DIAGNOSIS — E7800 Pure hypercholesterolemia, unspecified: Secondary | ICD-10-CM | POA: Diagnosis not present

## 2017-01-29 DIAGNOSIS — E1140 Type 2 diabetes mellitus with diabetic neuropathy, unspecified: Secondary | ICD-10-CM | POA: Diagnosis not present

## 2017-03-01 DIAGNOSIS — H401113 Primary open-angle glaucoma, right eye, severe stage: Secondary | ICD-10-CM | POA: Diagnosis not present

## 2017-05-01 DIAGNOSIS — M1711 Unilateral primary osteoarthritis, right knee: Secondary | ICD-10-CM | POA: Diagnosis not present

## 2017-05-01 DIAGNOSIS — M25561 Pain in right knee: Secondary | ICD-10-CM | POA: Diagnosis not present

## 2017-05-01 DIAGNOSIS — M25562 Pain in left knee: Secondary | ICD-10-CM | POA: Diagnosis not present

## 2017-05-02 DIAGNOSIS — G4733 Obstructive sleep apnea (adult) (pediatric): Secondary | ICD-10-CM | POA: Diagnosis not present

## 2017-06-04 DIAGNOSIS — M1712 Unilateral primary osteoarthritis, left knee: Secondary | ICD-10-CM | POA: Diagnosis not present

## 2017-06-04 DIAGNOSIS — M1711 Unilateral primary osteoarthritis, right knee: Secondary | ICD-10-CM | POA: Diagnosis not present

## 2017-06-04 DIAGNOSIS — M25562 Pain in left knee: Secondary | ICD-10-CM | POA: Diagnosis not present

## 2017-06-04 DIAGNOSIS — M170 Bilateral primary osteoarthritis of knee: Secondary | ICD-10-CM | POA: Diagnosis not present

## 2017-06-11 DIAGNOSIS — M170 Bilateral primary osteoarthritis of knee: Secondary | ICD-10-CM | POA: Diagnosis not present

## 2017-06-13 DIAGNOSIS — M810 Age-related osteoporosis without current pathological fracture: Secondary | ICD-10-CM | POA: Diagnosis not present

## 2017-06-17 DIAGNOSIS — G4733 Obstructive sleep apnea (adult) (pediatric): Secondary | ICD-10-CM | POA: Diagnosis not present

## 2017-06-18 DIAGNOSIS — M170 Bilateral primary osteoarthritis of knee: Secondary | ICD-10-CM | POA: Diagnosis not present

## 2017-06-18 DIAGNOSIS — M1712 Unilateral primary osteoarthritis, left knee: Secondary | ICD-10-CM | POA: Diagnosis not present

## 2017-06-18 DIAGNOSIS — M1711 Unilateral primary osteoarthritis, right knee: Secondary | ICD-10-CM | POA: Diagnosis not present

## 2017-06-25 DIAGNOSIS — H109 Unspecified conjunctivitis: Secondary | ICD-10-CM | POA: Diagnosis not present

## 2017-06-25 DIAGNOSIS — J069 Acute upper respiratory infection, unspecified: Secondary | ICD-10-CM | POA: Diagnosis not present

## 2017-07-06 ENCOUNTER — Emergency Department (HOSPITAL_BASED_OUTPATIENT_CLINIC_OR_DEPARTMENT_OTHER)
Admission: EM | Admit: 2017-07-06 | Discharge: 2017-07-06 | Disposition: A | Discharge status: Home / Self Care | Payer: Medicare Other | Attending: Emergency Medicine | Admitting: Emergency Medicine

## 2017-07-06 ENCOUNTER — Emergency Department (HOSPITAL_BASED_OUTPATIENT_CLINIC_OR_DEPARTMENT_OTHER): Payer: Medicare Other

## 2017-07-06 ENCOUNTER — Other Ambulatory Visit: Payer: Self-pay

## 2017-07-06 ENCOUNTER — Encounter (HOSPITAL_BASED_OUTPATIENT_CLINIC_OR_DEPARTMENT_OTHER): Payer: Self-pay | Admitting: *Deleted

## 2017-07-06 DIAGNOSIS — J209 Acute bronchitis, unspecified: Secondary | ICD-10-CM

## 2017-07-06 DIAGNOSIS — I10 Essential (primary) hypertension: Secondary | ICD-10-CM | POA: Diagnosis not present

## 2017-07-06 DIAGNOSIS — Z79899 Other long term (current) drug therapy: Secondary | ICD-10-CM | POA: Diagnosis not present

## 2017-07-06 DIAGNOSIS — E119 Type 2 diabetes mellitus without complications: Secondary | ICD-10-CM | POA: Insufficient documentation

## 2017-07-06 DIAGNOSIS — R05 Cough: Secondary | ICD-10-CM | POA: Diagnosis not present

## 2017-07-06 MED ORDER — HYDROCODONE-HOMATROPINE 5-1.5 MG/5ML PO SYRP: 5.0000 mL | ORAL_SOLUTION | Freq: Four times a day (QID) | ORAL | 0 refills | Status: DC | PRN

## 2017-07-06 MED ORDER — AZITHROMYCIN 250 MG PO TABS: ORAL_TABLET | ORAL | 0 refills | Status: DC

## 2017-07-06 MED ORDER — ALBUTEROL SULFATE HFA 108 (90 BASE) MCG/ACT IN AERS
2.0000 | INHALATION_SPRAY | Freq: Once | RESPIRATORY_TRACT | Status: AC
  Administered 2017-07-06: 2 via RESPIRATORY_TRACT
  Filled 2017-07-06: qty 6.7

## 2017-07-06 NOTE — ED Triage Notes (Signed)
Pt reports cough x 2 weeks, increasing in frequency, productive for small amount yellow sputum. States other people where she lives have similar symptoms. Pt ambulatory to triage with slow steady gait using her cane

## 2017-07-06 NOTE — ED Notes (Signed)
Pt and friend given d/c instructions as per chart. Rx x 2. Verbalizes understanding. No questions.

## 2017-07-06 NOTE — Discharge Instructions (Addendum)
1.  Use inhaler every 4-6 hours as needed for wheeze or cough.  You may use Hycodan syrup 1-2 teaspoons every 6 hours for severe cough. 2.  Follow-up with your doctor this week for recheck. 3.  Return to the emergency department if symptoms worsen or change.

## 2017-07-06 NOTE — ED Notes (Signed)
Prod cough x 2 weeks. Pt states she is coughing up a small amt of white sputum at times. Recently treated for pink eye.

## 2017-07-06 NOTE — ED Provider Notes (Signed)
[place] EMERGENCY DEPARTMENT Provider Note   CSN: [id number] Arrival date & time: 07/06/17  1931     History   Chief Complaint Chief Complaint  Patient presents with  . Cough    HPI Lindsey Baldwin is a 72 y.o. female.  HPI Patient has had 2 weeks of cough.  Has been gradually worsening.  Patient reports is productive of some sputum.  She gets short of breath with coughing paroxysmal.  No fever no chest pain.  Symptoms started with upper respiratory symptoms.  Patient reports she gets out of breath with coughing.  No lower extremity swelling or calf pain.  Patient reports she typically gets something like this once a year.  Once it starts is very hard for her to get rid of.  Patient goes to adult daycare and many of the other residents have had persistent respiratory illness. Past Medical History:  Diagnosis Date  . Arthritis   . Diabetes mellitus without complication ([place])   . GERD (gastroesophageal reflux disease)   . Glaucoma   . Hypertension   . Osteoporosis     There are no active problems to display for this patient.   Past Surgical History:  Procedure Laterality Date  . EYE SURGERY       OB History   None      Home Medications    Prior to Admission medications   Medication Sig Start Date End Date Taking? Authorizing Provider  alendronate (FOSAMAX) 70 MG tablet Take 70 mg by mouth every 7 (seven) days. Take with a full glass of water on an empty stomach.   Yes [provider]  aspirin 81 MG tablet Take 81 mg by mouth daily.   Yes [provider]  atorvastatin (LIPITOR) 40 MG tablet Take 40 mg by mouth daily.   Yes [provider]  enalapril (VASOTEC) 2.5 MG tablet Take 2.5 mg by mouth daily.   Yes [provider]  metFORMIN (GLUCOPHAGE) 500 MG tablet Take 500 mg by mouth 2 (two) times daily.   Yes [provider]  Nystatin-Triamcinolone (MYTREX EX) Apply topically.   Yes [provider]    Travoprost, BAK Free, (TRAVATAN) 0.004 % SOLN ophthalmic solution 1 drop at bedtime.   Yes [provider]  azithromycin (ZITHROMAX Z-PAK) 250 MG tablet 2 po day one, then 1 daily x 4 days 07/06/17   Charlesetta Shanks, MD  azithromycin (ZITHROMAX) 250 MG tablet Take 2 tabs PO x 1 dose, then 1 tab PO QD x 4 days 05/25/12   Roselee Culver, MD  chlorpheniramine-HYDROcodone [facility] PENNKINETIC ER) 10-8 MG/5ML [facility] Take 5 mLs by mouth every 12 (twelve) hours as needed (cough). 05/25/12   Roselee Culver, MD  HYDROcodone-homatropine [facility]) 5-1.5 MG/5ML syrup Take 5 mLs by mouth every 6 (six) hours as needed for cough. 07/06/17   Charlesetta Shanks, MD  lansoprazole (PREVACID) 15 MG capsule Take 15 mg by mouth daily.    [provider]    Family History Family History  Problem Relation Age of Onset  . Cancer Mother   . Cancer Father     Social History Social History   Tobacco Use  . Smoking status: Never Smoker  . Smokeless tobacco: Never Used  Substance Use Topics  . Alcohol use: No  . Drug use: No     Allergies   Penicillins and Sulfa antibiotics   Review of Systems Review of Systems 10 Systems reviewed and are negative for acute change  except as noted in the HPI.   Physical Exam Updated Vital Signs BP (!) 148/72 (BP Location: Left Arm)   Pulse 85   Temp 98.3 F (36.8 C) (Oral)   Resp 20   Ht 5\' 2"  (1.575 m)   Wt 81.6 kg (180 lb)   SpO2 95%   BMI 32.92 kg/m   Physical Exam  Constitutional: She is oriented to person, place, and time. She appears well-developed and well-nourished.  HENT:  Head: Normocephalic and atraumatic.  Nose: Nose normal.  Mouth/Throat: Oropharynx is clear and moist.  Eyes: Pupils are equal, round, and reactive to light. EOM are normal.  Neck: Neck supple.  Cardiovascular: Normal rate, regular rhythm, normal heart sounds and intact distal pulses.  Pulmonary/Chest: Effort normal and breath sounds normal.  Abdominal:  Soft. Bowel sounds are normal. She exhibits no distension. There is no tenderness.  Musculoskeletal: Normal range of motion. She exhibits no edema.  Neurological: She is alert and oriented to person, place, and time. She has normal strength. No cranial nerve deficit. She exhibits normal muscle tone. Coordination normal. GCS eye subscore is 4. GCS verbal subscore is 5. GCS motor subscore is 6.  Skin: Skin is warm, dry and intact.  Psychiatric: She has a normal mood and affect.     ED Treatments / Results  Labs (all labs ordered are listed, but only abnormal results are displayed) Labs Reviewed - No data to display  EKG None  Radiology Dg Chest 2 View  Result Date: 07/06/2017 CLINICAL DATA:  Coughing congestion for 2 weeks EXAM: CHEST - 2 VIEW COMPARISON:  None. FINDINGS: The lungs are clear without focal pneumonia, edema, pneumothorax or pleural effusion. The cardiopericardial silhouette is within normal limits for size. The visualized bony structures of the thorax are intact. IMPRESSION: No active cardiopulmonary disease. Electronically Signed   By: Misty Stanley M.D.   On: 07/06/2017 20:18    Procedures Procedures (including critical care time)  Medications Ordered in ED Medications  albuterol (PROVENTIL HFA;VENTOLIN HFA) 108 (90 Base) MCG/ACT inhaler 2 puff (2 puffs Inhalation Given 07/06/17 2130)     Initial Impression / Assessment and Plan / ED Course  I have reviewed the triage vital signs and the nursing notes.  Pertinent labs & imaging results that were available during my care of the patient were reviewed by me and considered in my medical decision making (see chart for details).      Final Clinical Impressions(s) / ED Diagnoses   Final diagnoses:  Acute bronchitis, unspecified organism  Presents with persistent cough over 2 weeks.  Patient is getting dyspneic with cough.  Cough has become productive.  At this time will trial a course of Zithromax, Hycodan and  albuterol.  Patient does not have chest pain, mental status change, fever or generalized weakness.  At this  time I feel she is stable for outpatient treatment.  Return precautions are reviewed.  ED Discharge Orders        Ordered    azithromycin (ZITHROMAX Z-PAK) 250 MG tablet     07/06/17 2115    HYDROcodone-homatropine (HYCODAN) 5-1.5 MG/5ML syrup  Every 6 hours PRN     07/06/17 2115       Charlesetta Shanks, MD 07/06/17 2143

## 2017-07-11 DIAGNOSIS — J209 Acute bronchitis, unspecified: Secondary | ICD-10-CM | POA: Diagnosis not present

## 2017-07-30 DIAGNOSIS — E1140 Type 2 diabetes mellitus with diabetic neuropathy, unspecified: Secondary | ICD-10-CM | POA: Diagnosis not present

## 2017-07-30 DIAGNOSIS — E7800 Pure hypercholesterolemia, unspecified: Secondary | ICD-10-CM | POA: Diagnosis not present

## 2017-07-30 DIAGNOSIS — R0789 Other chest pain: Secondary | ICD-10-CM | POA: Diagnosis not present

## 2017-07-30 DIAGNOSIS — D179 Benign lipomatous neoplasm, unspecified: Secondary | ICD-10-CM | POA: Diagnosis not present

## 2017-08-01 DIAGNOSIS — M1712 Unilateral primary osteoarthritis, left knee: Secondary | ICD-10-CM | POA: Diagnosis not present

## 2017-08-01 DIAGNOSIS — M1711 Unilateral primary osteoarthritis, right knee: Secondary | ICD-10-CM | POA: Diagnosis not present

## 2017-08-14 DIAGNOSIS — M25562 Pain in left knee: Secondary | ICD-10-CM | POA: Diagnosis not present

## 2017-08-14 DIAGNOSIS — M1712 Unilateral primary osteoarthritis, left knee: Secondary | ICD-10-CM | POA: Diagnosis not present

## 2017-08-14 DIAGNOSIS — M1711 Unilateral primary osteoarthritis, right knee: Secondary | ICD-10-CM | POA: Diagnosis not present

## 2017-08-14 DIAGNOSIS — M25561 Pain in right knee: Secondary | ICD-10-CM | POA: Diagnosis not present

## 2017-08-15 ENCOUNTER — Other Ambulatory Visit: Payer: Self-pay | Admitting: Surgery

## 2017-08-15 DIAGNOSIS — R221 Localized swelling, mass and lump, neck: Secondary | ICD-10-CM | POA: Diagnosis not present

## 2017-08-20 ENCOUNTER — Other Ambulatory Visit: Payer: Self-pay | Admitting: Family Medicine

## 2017-08-20 DIAGNOSIS — Z1231 Encounter for screening mammogram for malignant neoplasm of breast: Secondary | ICD-10-CM

## 2017-08-31 HISTORY — PX: CYST EXCISION: SHX5701

## 2017-09-11 ENCOUNTER — Ambulatory Visit
Admission: RE | Admit: 2017-09-11 | Discharge: 2017-09-11 | Disposition: A | Discharge status: Home / Self Care | Payer: Medicare Other | Source: Ambulatory Visit | Attending: Family Medicine | Admitting: Family Medicine

## 2017-09-11 DIAGNOSIS — Z1231 Encounter for screening mammogram for malignant neoplasm of breast: Secondary | ICD-10-CM

## 2017-09-30 DIAGNOSIS — D171 Benign lipomatous neoplasm of skin and subcutaneous tissue of trunk: Secondary | ICD-10-CM | POA: Diagnosis not present

## 2017-09-30 DIAGNOSIS — D170 Benign lipomatous neoplasm of skin and subcutaneous tissue of head, face and neck: Secondary | ICD-10-CM | POA: Diagnosis not present

## 2017-09-30 DIAGNOSIS — R221 Localized swelling, mass and lump, neck: Secondary | ICD-10-CM | POA: Diagnosis not present

## 2017-10-04 DIAGNOSIS — G4733 Obstructive sleep apnea (adult) (pediatric): Secondary | ICD-10-CM | POA: Diagnosis not present

## 2017-10-14 DIAGNOSIS — M25561 Pain in right knee: Secondary | ICD-10-CM | POA: Diagnosis not present

## 2017-10-14 DIAGNOSIS — R2681 Unsteadiness on feet: Secondary | ICD-10-CM | POA: Diagnosis not present

## 2017-10-14 DIAGNOSIS — M6281 Muscle weakness (generalized): Secondary | ICD-10-CM | POA: Diagnosis not present

## 2017-10-17 DIAGNOSIS — M25561 Pain in right knee: Secondary | ICD-10-CM | POA: Diagnosis not present

## 2017-10-17 DIAGNOSIS — R2681 Unsteadiness on feet: Secondary | ICD-10-CM | POA: Diagnosis not present

## 2017-10-17 DIAGNOSIS — M6281 Muscle weakness (generalized): Secondary | ICD-10-CM | POA: Diagnosis not present

## 2017-10-18 DIAGNOSIS — R2681 Unsteadiness on feet: Secondary | ICD-10-CM | POA: Diagnosis not present

## 2017-10-18 DIAGNOSIS — M6281 Muscle weakness (generalized): Secondary | ICD-10-CM | POA: Diagnosis not present

## 2017-10-18 DIAGNOSIS — M25561 Pain in right knee: Secondary | ICD-10-CM | POA: Diagnosis not present

## 2017-10-22 DIAGNOSIS — M6281 Muscle weakness (generalized): Secondary | ICD-10-CM | POA: Diagnosis not present

## 2017-10-22 DIAGNOSIS — M25561 Pain in right knee: Secondary | ICD-10-CM | POA: Diagnosis not present

## 2017-10-22 DIAGNOSIS — R2681 Unsteadiness on feet: Secondary | ICD-10-CM | POA: Diagnosis not present

## 2017-10-23 DIAGNOSIS — M25561 Pain in right knee: Secondary | ICD-10-CM | POA: Diagnosis not present

## 2017-10-23 DIAGNOSIS — M6281 Muscle weakness (generalized): Secondary | ICD-10-CM | POA: Diagnosis not present

## 2017-10-23 DIAGNOSIS — R2681 Unsteadiness on feet: Secondary | ICD-10-CM | POA: Diagnosis not present

## 2017-10-25 DIAGNOSIS — M25561 Pain in right knee: Secondary | ICD-10-CM | POA: Diagnosis not present

## 2017-10-25 DIAGNOSIS — M6281 Muscle weakness (generalized): Secondary | ICD-10-CM | POA: Diagnosis not present

## 2017-10-25 DIAGNOSIS — R2681 Unsteadiness on feet: Secondary | ICD-10-CM | POA: Diagnosis not present

## 2017-10-28 DIAGNOSIS — R2681 Unsteadiness on feet: Secondary | ICD-10-CM | POA: Diagnosis not present

## 2017-10-28 DIAGNOSIS — M25561 Pain in right knee: Secondary | ICD-10-CM | POA: Diagnosis not present

## 2017-10-28 DIAGNOSIS — H401113 Primary open-angle glaucoma, right eye, severe stage: Secondary | ICD-10-CM | POA: Diagnosis not present

## 2017-10-28 DIAGNOSIS — H548 Legal blindness, as defined in USA: Secondary | ICD-10-CM | POA: Diagnosis not present

## 2017-10-28 DIAGNOSIS — M6281 Muscle weakness (generalized): Secondary | ICD-10-CM | POA: Diagnosis not present

## 2017-10-28 DIAGNOSIS — Z970 Presence of artificial eye: Secondary | ICD-10-CM | POA: Diagnosis not present

## 2017-10-29 DIAGNOSIS — E1140 Type 2 diabetes mellitus with diabetic neuropathy, unspecified: Secondary | ICD-10-CM | POA: Diagnosis not present

## 2017-10-29 DIAGNOSIS — M6281 Muscle weakness (generalized): Secondary | ICD-10-CM | POA: Diagnosis not present

## 2017-10-29 DIAGNOSIS — R2681 Unsteadiness on feet: Secondary | ICD-10-CM | POA: Diagnosis not present

## 2017-10-29 DIAGNOSIS — M25561 Pain in right knee: Secondary | ICD-10-CM | POA: Diagnosis not present

## 2017-10-29 DIAGNOSIS — Z7984 Long term (current) use of oral hypoglycemic drugs: Secondary | ICD-10-CM | POA: Diagnosis not present

## 2017-10-29 DIAGNOSIS — Z23 Encounter for immunization: Secondary | ICD-10-CM | POA: Diagnosis not present

## 2017-10-30 DIAGNOSIS — H548 Legal blindness, as defined in USA: Secondary | ICD-10-CM | POA: Insufficient documentation

## 2017-10-31 DIAGNOSIS — M6281 Muscle weakness (generalized): Secondary | ICD-10-CM | POA: Diagnosis not present

## 2017-10-31 DIAGNOSIS — M25561 Pain in right knee: Secondary | ICD-10-CM | POA: Diagnosis not present

## 2017-10-31 DIAGNOSIS — R2681 Unsteadiness on feet: Secondary | ICD-10-CM | POA: Diagnosis not present

## 2017-11-08 DIAGNOSIS — M6281 Muscle weakness (generalized): Secondary | ICD-10-CM | POA: Diagnosis not present

## 2017-11-08 DIAGNOSIS — R2681 Unsteadiness on feet: Secondary | ICD-10-CM | POA: Diagnosis not present

## 2017-11-08 DIAGNOSIS — M25561 Pain in right knee: Secondary | ICD-10-CM | POA: Diagnosis not present

## 2017-11-12 DIAGNOSIS — R2681 Unsteadiness on feet: Secondary | ICD-10-CM | POA: Diagnosis not present

## 2017-11-12 DIAGNOSIS — M6281 Muscle weakness (generalized): Secondary | ICD-10-CM | POA: Diagnosis not present

## 2017-11-12 DIAGNOSIS — M25561 Pain in right knee: Secondary | ICD-10-CM | POA: Diagnosis not present

## 2017-11-13 DIAGNOSIS — M25561 Pain in right knee: Secondary | ICD-10-CM | POA: Diagnosis not present

## 2017-11-13 DIAGNOSIS — R2681 Unsteadiness on feet: Secondary | ICD-10-CM | POA: Diagnosis not present

## 2017-11-13 DIAGNOSIS — M6281 Muscle weakness (generalized): Secondary | ICD-10-CM | POA: Diagnosis not present

## 2017-11-18 DIAGNOSIS — M1712 Unilateral primary osteoarthritis, left knee: Secondary | ICD-10-CM | POA: Diagnosis not present

## 2017-11-18 DIAGNOSIS — M1711 Unilateral primary osteoarthritis, right knee: Secondary | ICD-10-CM | POA: Diagnosis not present

## 2017-11-19 DIAGNOSIS — M6281 Muscle weakness (generalized): Secondary | ICD-10-CM | POA: Diagnosis not present

## 2017-11-19 DIAGNOSIS — R2681 Unsteadiness on feet: Secondary | ICD-10-CM | POA: Diagnosis not present

## 2017-11-19 DIAGNOSIS — M25561 Pain in right knee: Secondary | ICD-10-CM | POA: Diagnosis not present

## 2017-11-21 DIAGNOSIS — M25561 Pain in right knee: Secondary | ICD-10-CM | POA: Diagnosis not present

## 2017-11-21 DIAGNOSIS — M6281 Muscle weakness (generalized): Secondary | ICD-10-CM | POA: Diagnosis not present

## 2017-11-21 DIAGNOSIS — R2681 Unsteadiness on feet: Secondary | ICD-10-CM | POA: Diagnosis not present

## 2017-11-22 DIAGNOSIS — M25561 Pain in right knee: Secondary | ICD-10-CM | POA: Diagnosis not present

## 2017-11-22 DIAGNOSIS — M6281 Muscle weakness (generalized): Secondary | ICD-10-CM | POA: Diagnosis not present

## 2017-11-22 DIAGNOSIS — R2681 Unsteadiness on feet: Secondary | ICD-10-CM | POA: Diagnosis not present

## 2017-11-26 DIAGNOSIS — G4733 Obstructive sleep apnea (adult) (pediatric): Secondary | ICD-10-CM | POA: Diagnosis not present

## 2017-11-26 DIAGNOSIS — M1711 Unilateral primary osteoarthritis, right knee: Secondary | ICD-10-CM | POA: Diagnosis not present

## 2017-11-26 DIAGNOSIS — E7800 Pure hypercholesterolemia, unspecified: Secondary | ICD-10-CM | POA: Diagnosis not present

## 2017-11-26 DIAGNOSIS — E1140 Type 2 diabetes mellitus with diabetic neuropathy, unspecified: Secondary | ICD-10-CM | POA: Diagnosis not present

## 2017-12-03 ENCOUNTER — Ambulatory Visit: Payer: Self-pay | Admitting: Orthopedic Surgery

## 2017-12-03 DIAGNOSIS — M1711 Unilateral primary osteoarthritis, right knee: Secondary | ICD-10-CM

## 2017-12-03 NOTE — H&P (Deleted)
  The note originally documented on this encounter has been moved the the encounter in which it belongs.  

## 2017-12-03 NOTE — H&P (Signed)
TOTAL KNEE ADMISSION H&P  Patient is being admitted for right total knee arthroplasty.  Subjective:  Chief Complaint:right knee pain.  HPI: Lindsey Baldwin, 72 y.o. female, has a history of pain and functional disability in the right knee due to arthritis and has failed non-surgical conservative treatments for greater than 12 weeks to includeNSAID's and/or analgesics, corticosteriod injections, viscosupplementation injections, flexibility and strengthening excercises, use of assistive devices, weight reduction as appropriate and activity modification.  Onset of symptoms was gradual, starting >10 years ago with gradually worsening course since that time. The patient noted no past surgery on the right knee(s).  Patient currently rates pain in the right knee(s) at 10 out of 10 with activity. Patient has night pain, worsening of pain with activity and weight bearing, pain that interferes with activities of daily living, pain with passive range of motion, crepitus and joint swelling.  Patient has evidence of subchondral cysts, subchondral sclerosis, periarticular osteophytes and joint space narrowing by imaging studies.There is no active infection.  There are no active problems to display for this patient.  Past Medical History:  Diagnosis Date  . 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 Family history of adverse reaction to anesthesia    MOTHER-- PONV  . GERD (gastroesophageal reflux disease)   . Glaucoma, right eye    CLOSED ANGLE  . Hypertension   . OA (osteoarthritis)    KNEES , HANDS  . OAB (overactive bladder)   . OSA on CPAP   . Osteoporosis   . Type 2 diabetes mellitus ([place])    FOLLOWED BY PCP  . Wears glasses     Past Surgical History:  Procedure Laterality Date  . CYST EXCISION  08/2017   POSTERIOR NECK  . EYE SURGERY Left 1992   REMOVE EYE AND PLACEMENT PROSTHESIS  . TONSILLECTOMY  AGE 36    No current facility-administered medications for this visit.    No current  outpatient medications on file.   Facility-Administered Medications Ordered in Other Visits  Medication Dose Route Frequency Provider Last Rate Last Dose  . 0.9 %  sodium chloride infusion   Intravenous Continuous Sherin Murdoch, Aaron Edelman, MD      . acetaminophen (OFIRMEV) IV 1,000 mg  1,000 mg Intravenous To OR Dannell Gortney, Aaron Edelman, MD      . chlorhexidine (HIBICLENS) 4 % liquid 4 application  60 mL Topical Once Zaul Hubers, Aaron Edelman, MD      . clindamycin (CLEOCIN) IVPB 900 mg  900 mg Intravenous On Call to OR Rod Can, MD      . cloNIDine (DURACLON) 100 mcg/mL inj- OR Only    Anesthesia Intra-op Montez Hageman, MD   100 mcg at 12/12/17 0910  . lactated ringers infusion   Intravenous Continuous Montez Hageman, MD 50 mL/hr at 12/12/17 0827    . midazolam (VERSED) injection 1-2 mg  1-2 mg Intravenous Harlene Ramus, MD      . ropivacaine (PF) 7.5 mg/mL (0.75%) (NAROPIN) injection    Anesthesia Intra-op Montez Hageman, MD   20 mL at 12/12/17 0914  . tranexamic acid (CYKLOKAPRON) 1,000 mg in sodium chloride 0.9 % 100 mL IVPB  1,000 mg Intravenous To OR Nuriyah Hanline, Aaron Edelman, MD       Allergies  Allergen Reactions  . Penicillins Swelling and Other (See Comments)    Has patient had a PCN reaction causing immediate rash, facial/tongue/throat swelling, SOB or lightheadedness with hypotension: YES Has patient had a PCN reaction causing severe rash involving mucus membranes  or skin necrosis: NO Has patient had a PCN reaction that required hospitalization: NO Has patient had a PCN reaction occurring within the last 10 years: NO If all of the above answers are "NO", then may proceed with Cephalosporin use.   . Sulfa Antibiotics Rash    Social History   Tobacco Use  . Smoking status: Never Smoker  . Smokeless tobacco: Never Used  Substance Use Topics  . Alcohol use: No    Family History  Problem Relation Age of Onset  . Cancer Mother   . Cancer Father      Review of Systems  Constitutional:  Negative.   HENT: Negative.   Eyes: Negative.   Respiratory: Negative.   Cardiovascular: Negative.   Gastrointestinal: Negative.   Genitourinary: Positive for frequency.  Musculoskeletal: Positive for back pain and joint pain.  Skin: Negative.   Neurological: Negative.   Endo/Heme/Allergies: Negative.   Psychiatric/Behavioral: Negative.     Objective:  Physical Exam  Vitals reviewed. Constitutional: She is oriented to person, place, and time. She appears well-developed and well-nourished.  HENT:  Head: Normocephalic and atraumatic.  Eyes: Pupils are equal, round, and reactive to light. Conjunctivae and EOM are normal.  Neck: Normal range of motion. Neck supple.  Cardiovascular: Normal rate, regular rhythm, normal heart sounds and intact distal pulses.  Respiratory: Effort normal and breath sounds normal. No respiratory distress.  GI: Soft. Bowel sounds are normal. She exhibits no distension.  Genitourinary:  Genitourinary Comments: deferred  Musculoskeletal:       Right knee: She exhibits decreased range of motion, swelling, effusion and abnormal alignment. Tenderness found. Medial joint line and lateral joint line tenderness noted.  Neurological: She is alert and oriented to person, place, and time. She has normal reflexes.  Skin: Skin is warm and dry.  Psychiatric: She has a normal mood and affect. Her behavior is normal. Judgment and thought content normal.    Vital signs in last 24 hours: @VSRANGES @  Labs:   Estimated body mass index is 30.73 kg/m as calculated from the following:   Height as of 12/12/17: 5\' 2"  (1.575 m).   Weight as of 12/12/17: 76.2 kg.   Imaging Review Plain radiographs demonstrate severe degenerative joint disease of the right knee(s). The overall alignment issignificant varus. The bone quality appears to be adequate for age and reported activity level.   Preoperative templating of the joint replacement has been completed, documented, and  submitted to the Operating Room personnel in order to optimize intra-operative equipment management.   Anticipated LOS equal to or greater than 2 midnights due to - Age 58 and older with one or more of the following:  - Obesity  - Expected need for hospital services (PT, OT, Nursing) required for safe  discharge  - Anticipated need for postoperative skilled nursing care or inpatient rehab  - Active co-morbidities: Diabetes OR   - Unanticipated findings during/Post Surgery: None  - Patient is a high risk of re-admission due to: None     Assessment/Plan:  End stage arthritis, right knee   The patient history, physical examination, clinical judgment of the provider and imaging studies are consistent with end stage degenerative joint disease of the right knee(s) and total knee arthroplasty is deemed medically necessary. The treatment options including medical management, injection therapy arthroscopy and arthroplasty were discussed at length. The risks and benefits of total knee arthroplasty were presented and reviewed. The risks due to aseptic loosening, infection, stiffness, patella tracking problems, thromboembolic complications and  other imponderables were discussed. The patient acknowledged the explanation, agreed to proceed with the plan and consent was signed. Patient is being admitted for inpatient treatment for surgery, pain control, PT, OT, prophylactic antibiotics, VTE prophylaxis, progressive ambulation and ADL's and discharge planning. The patient is planning to be discharged home with outpatient PT. Rx for DME given.

## 2017-12-04 ENCOUNTER — Other Ambulatory Visit: Payer: Self-pay

## 2017-12-04 ENCOUNTER — Encounter (HOSPITAL_COMMUNITY)
Admission: RE | Admit: 2017-12-04 | Discharge: 2017-12-04 | Disposition: A | Discharge status: Home / Self Care | Payer: Medicare Other | Source: Ambulatory Visit | Attending: Orthopedic Surgery | Admitting: Orthopedic Surgery

## 2017-12-04 ENCOUNTER — Encounter (HOSPITAL_COMMUNITY): Payer: Self-pay

## 2017-12-04 DIAGNOSIS — Z01812 Encounter for preprocedural laboratory examination: Secondary | ICD-10-CM | POA: Insufficient documentation

## 2017-12-04 DIAGNOSIS — E119 Type 2 diabetes mellitus without complications: Secondary | ICD-10-CM | POA: Diagnosis not present

## 2017-12-04 HISTORY — DX: Dependence on other enabling machines and devices: Z99.89

## 2017-12-04 HISTORY — DX: Family history of other specified conditions: Z84.89

## 2017-12-04 HISTORY — DX: Blindness, one eye, unspecified eye: H54.40

## 2017-12-04 HISTORY — DX: Unspecified osteoarthritis, unspecified site: M19.90

## 2017-12-04 HISTORY — DX: Obstructive sleep apnea (adult) (pediatric): G47.33

## 2017-12-04 HISTORY — DX: Presence of spectacles and contact lenses: Z97.3

## 2017-12-04 HISTORY — DX: Unspecified glaucoma: H40.9

## 2017-12-04 HISTORY — DX: Type 2 diabetes mellitus without complications: E11.9

## 2017-12-04 HISTORY — DX: Overactive bladder: N32.81

## 2017-12-04 LAB — HEMOGLOBIN A1C
HEMOGLOBIN A1C: 7.2 % — AB (ref 4.8–5.6)
MEAN PLASMA GLUCOSE: 159.94 mg/dL

## 2017-12-04 LAB — SURGICAL PCR SCREEN
MRSA, PCR: NEGATIVE
Staphylococcus aureus: NEGATIVE

## 2017-12-04 LAB — GLUCOSE, CAPILLARY: GLUCOSE-CAPILLARY: 112 mg/dL — AB (ref 70–99)

## 2017-12-04 NOTE — Patient Instructions (Addendum)
Lindsey Baldwin  DOB  Aug 26, 1945    Your procedure is scheduled on:  12-12-2017  Report to [facility] Main  Entrance,   Report to admitting at 7:30 AM    Call this number if you have problems the morning of surgery [phone]                Remember: Do not eat food or drink liquids :After Midnight.     Take these medicines the morning of surgery with A SIP OF WATER:    MYRBETRIQ, EYE DROPS AS USUAL               DO NOT TAKE ANY DIABETIC MEDICATIONS DAY OF YOUR SURGERY                               You may not have any metal on your body including hair pins and              piercings               Do not wear jewelry, make-up, lotions, powders or perfumes, deodorant             Do not wear nail polish.              Do not shave  48 hours prior to surgery.     Do not bring valuables to the hospital. [place].  Contacts, dentures or bridgework may not be worn into surgery.  Leave suitcase in the car. After surgery it may be brought to your room.   Special Instructions: N/A              Please read over the following fact sheets you were given: _____________________________________________________________________  [facility] - Preparing for Surgery Before surgery, you can play an important role.  Because skin is not sterile, your skin needs to be as free of germs as possible.  You can reduce the number of germs on your skin by washing with CHG (chlorahexidine gluconate) soap before surgery.  CHG is an antiseptic cleaner which kills germs and bonds with the skin to continue killing germs even after washing. Please DO NOT use if you have an allergy to CHG or antibacterial soaps.  If your skin becomes reddened/irritated stop using the CHG and inform your nurse when you arrive at Short Stay. Do not shave (including legs and underarms) for at least 48 hours prior to the first CHG shower.  You may shave your  face/neck. Please follow these instructions carefully:  1.  Shower with CHG Soap the night before surgery and the  morning of Surgery.  2.  If you choose to wash your hair, wash your hair first as usual with your  normal  shampoo.  3.  After you shampoo, rinse your hair and body thoroughly to remove the  shampoo.                                       4.  Use CHG as you would any other liquid soap.  You can apply chg directly  to the skin and wash  Gently with a scrungie or clean washcloth.  5.  Apply the CHG Soap to your body ONLY FROM THE NECK DOWN.   Do not use on face/ open                           Wound or open sores. Avoid contact with eyes, ears mouth and genitals (private parts).                       Wash face,  Genitals (private parts) with your normal soap.             6.  Wash thoroughly, paying special attention to the area where your surgery  will be performed.  7.  Thoroughly rinse your body with warm water from the neck down.  8.  DO NOT shower/wash with your normal soap after using and rinsing off  the CHG Soap.             9.  Pat yourself dry with a clean towel.            10.  Wear clean pajamas.            11.  Place clean sheets on your bed the night of your first shower and do not  sleep with pets. Day of Surgery : Do not apply any lotions/deodorants the morning of surgery.  Please wear clean clothes to the hospital/surgery center.  FAILURE TO FOLLOW THESE INSTRUCTIONS MAY RESULT IN THE CANCELLATION OF YOUR SURGERY PATIENT SIGNATURE_________________________________  NURSE SIGNATURE__________________________________  ________________________________________________________________________   Adam Phenix  An incentive spirometer is a tool that can help keep your lungs clear and active. This tool measures how well you are filling your lungs with each breath. Taking long deep breaths may help reverse or decrease the chance of developing  breathing (pulmonary) problems (especially infection) following:  A long period of time when you are unable to move or be active. BEFORE THE PROCEDURE   If the spirometer includes an indicator to show your best effort, your nurse or respiratory therapist will set it to a desired goal.  If possible, sit up straight or lean slightly forward. Try not to slouch.  Hold the incentive spirometer in an upright position. INSTRUCTIONS FOR USE  1. Sit on the edge of your bed if possible, or sit up as far as you can in bed or on a chair. 2. Hold the incentive spirometer in an upright position. 3. Breathe out normally. 4. Place the mouthpiece in your mouth and seal your lips tightly around it. 5. Breathe in slowly and as deeply as possible, raising the piston or the ball toward the top of the column. 6. Hold your breath for 3-5 seconds or for as long as possible. Allow the piston or ball to fall to the bottom of the column. 7. Remove the mouthpiece from your mouth and breathe out normally. 8. Rest for a few seconds and repeat Steps 1 through 7 at least 10 times every 1-2 hours when you are awake. Take your time and take a few normal breaths between deep breaths. 9. The spirometer may include an indicator to show your best effort. Use the indicator as a goal to work toward during each repetition. 10. After each set of 10 deep breaths, practice coughing to be sure your lungs are clear. If you have an incision (the cut made at the time of surgery), support your incision  when coughing by placing a pillow or rolled up towels firmly against it. Once you are able to get out of bed, walk around indoors and cough well. You may stop using the incentive spirometer when instructed by your caregiver.  RISKS AND COMPLICATIONS  Take your time so you do not get dizzy or light-headed.  If you are in pain, you may need to take or ask for pain medication before doing incentive spirometry. It is harder to take a deep  breath if you are having pain. AFTER USE  Rest and breathe slowly and easily.  It can be helpful to keep track of a log of your progress. Your caregiver can provide you with a simple table to help with this. If you are using the spirometer at home, follow these instructions: [place] IF:   You are having difficultly using the spirometer.  You have trouble using the spirometer as often as instructed.  Your pain medication is not giving enough relief while using the spirometer.  You develop fever of 100.5 F (38.1 C) or higher. SEEK IMMEDIATE MEDICAL CARE IF:   You cough up bloody sputum that had not been present before.  You develop fever of 102 F (38.9 C) or greater.  You develop worsening pain at or near the incision site. MAKE SURE YOU:   Understand these instructions.  Will watch your condition.  Will get help right away if you are not doing well or get worse. Document Released: 07/30/2006 Document Revised: 06/11/2011 Document Reviewed: 09/30/2006 Exit[employer] Patient Information 2014 Exit[employer], [place].   ________________________________________________________________________  WHAT IS A BLOOD TRANSFUSION? Blood Transfusion Information  A transfusion is the replacement of blood or some of its parts. Blood is made up of multiple cells which provide different functions.  Red blood cells carry oxygen and are used for blood loss replacement.  White blood cells fight against infection.  Platelets control bleeding.  Plasma helps clot blood.  Other blood products are available for specialized needs, such as hemophilia or other clotting disorders. BEFORE THE TRANSFUSION  Who gives blood for transfusions?   Healthy volunteers who are fully evaluated to make sure their blood is safe. This is blood bank blood. Transfusion therapy is the safest it has ever been in the practice of medicine. Before blood is taken from a donor, a complete history is taken to make sure  that person has no history of diseases nor engages in risky social behavior (examples are intravenous drug use or sexual activity with multiple partners). The donor's travel history is screened to minimize risk of transmitting infections, such as malaria. The donated blood is tested for signs of infectious diseases, such as HIV and hepatitis. The blood is then tested to be sure it is compatible with you in order to minimize the chance of a transfusion reaction. If you or a relative donates blood, this is often done in anticipation of surgery and is not appropriate for emergency situations. It takes many days to process the donated blood. RISKS AND COMPLICATIONS Although transfusion therapy is very safe and saves many lives, the main dangers of transfusion include:   Getting an infectious disease.  Developing a transfusion reaction. This is an allergic reaction to something in the blood you were given. Every precaution is taken to prevent this. The decision to have a blood transfusion has been considered carefully by your caregiver before blood is given. Blood is not given unless the benefits outweigh the risks. AFTER THE TRANSFUSION  Right after  receiving a blood transfusion, you will usually feel much better and more energetic. This is especially true if your red blood cells have gotten low (anemic). The transfusion raises the level of the red blood cells which carry oxygen, and this usually causes an energy increase.  The nurse administering the transfusion will monitor you carefully for complications. HOME CARE INSTRUCTIONS  No special instructions are needed after a transfusion. You may find your energy is better. Speak with your caregiver about any limitations on activity for underlying diseases you may have. SEEK MEDICAL CARE IF:   Your condition is not improving after your transfusion.  You develop redness or irritation at the intravenous (IV) site. SEEK IMMEDIATE MEDICAL CARE IF:  Any of  the following symptoms occur over the next 12 hours:  Shaking chills.  You have a temperature by mouth above 102 F (38.9 C), not controlled by medicine.  Chest, back, or muscle pain.  People around you feel you are not acting correctly or are confused.  Shortness of breath or difficulty breathing.  Dizziness and fainting.  You get a rash or develop hives.  You have a decrease in urine output.  Your urine turns a dark color or changes to pink, red, or brown. Any of the following symptoms occur over the next 10 days:  You have a temperature by mouth above 102 F (38.9 C), not controlled by medicine.  Shortness of breath.  Weakness after normal activity.  The white part of the eye turns yellow (jaundice).  You have a decrease in the amount of urine or are urinating less often.  Your urine turns a dark color or changes to pink, red, or brown. Document Released: 03/16/2000 Document Revised: 06/11/2011 Document Reviewed: 11/03/2007 [facility] Patient Information 2014 [place], [place].  _______________________________________________________________________

## 2017-12-04 NOTE — Progress Notes (Signed)
Pt pcp clearance, dr sun, dated 11-26-2017 with chart.  In clearance includes lab results dated 11-26-2017, CBCdiff , CMP , and EKG.

## 2017-12-05 LAB — ABO/RH: ABO/RH(D): O POS

## 2017-12-12 ENCOUNTER — Encounter (HOSPITAL_COMMUNITY): Payer: Self-pay | Admitting: Emergency Medicine

## 2017-12-12 ENCOUNTER — Other Ambulatory Visit: Payer: Self-pay

## 2017-12-12 ENCOUNTER — Inpatient Hospital Stay (HOSPITAL_COMMUNITY)
Admission: RE | Admit: 2017-12-12 | Discharge: 2017-12-14 | DRG: 470 | Disposition: A | Discharge status: Home / Self Care | Payer: Medicare Other | Source: Ambulatory Visit | Attending: Orthopedic Surgery | Admitting: Orthopedic Surgery

## 2017-12-12 ENCOUNTER — Inpatient Hospital Stay (HOSPITAL_COMMUNITY): Payer: Medicare Other | Admitting: Anesthesiology

## 2017-12-12 ENCOUNTER — Encounter (HOSPITAL_COMMUNITY)
Admission: RE | Disposition: A | Discharge status: Home / Self Care | Payer: Self-pay | Source: Ambulatory Visit | Attending: Orthopedic Surgery

## 2017-12-12 ENCOUNTER — Inpatient Hospital Stay (HOSPITAL_COMMUNITY): Payer: Medicare Other

## 2017-12-12 DIAGNOSIS — K219 Gastro-esophageal reflux disease without esophagitis: Secondary | ICD-10-CM | POA: Diagnosis not present

## 2017-12-12 DIAGNOSIS — Z79899 Other long term (current) drug therapy: Secondary | ICD-10-CM

## 2017-12-12 DIAGNOSIS — E119 Type 2 diabetes mellitus without complications: Secondary | ICD-10-CM | POA: Diagnosis present

## 2017-12-12 DIAGNOSIS — M1711 Unilateral primary osteoarthritis, right knee: Secondary | ICD-10-CM | POA: Insufficient documentation

## 2017-12-12 DIAGNOSIS — H409 Unspecified glaucoma: Secondary | ICD-10-CM | POA: Diagnosis present

## 2017-12-12 DIAGNOSIS — Z96651 Presence of right artificial knee joint: Secondary | ICD-10-CM

## 2017-12-12 DIAGNOSIS — H5462 Unqualified visual loss, left eye, normal vision right eye: Secondary | ICD-10-CM | POA: Diagnosis present

## 2017-12-12 DIAGNOSIS — M810 Age-related osteoporosis without current pathological fracture: Secondary | ICD-10-CM | POA: Diagnosis present

## 2017-12-12 DIAGNOSIS — G8918 Other acute postprocedural pain: Secondary | ICD-10-CM | POA: Diagnosis not present

## 2017-12-12 DIAGNOSIS — Z7984 Long term (current) use of oral hypoglycemic drugs: Secondary | ICD-10-CM

## 2017-12-12 DIAGNOSIS — G4733 Obstructive sleep apnea (adult) (pediatric): Secondary | ICD-10-CM | POA: Diagnosis present

## 2017-12-12 DIAGNOSIS — Z7982 Long term (current) use of aspirin: Secondary | ICD-10-CM

## 2017-12-12 DIAGNOSIS — Z880 Allergy status to penicillin: Secondary | ICD-10-CM

## 2017-12-12 DIAGNOSIS — N3281 Overactive bladder: Secondary | ICD-10-CM | POA: Diagnosis present

## 2017-12-12 DIAGNOSIS — Z471 Aftercare following joint replacement surgery: Secondary | ICD-10-CM | POA: Diagnosis not present

## 2017-12-12 DIAGNOSIS — I10 Essential (primary) hypertension: Secondary | ICD-10-CM | POA: Diagnosis present

## 2017-12-12 HISTORY — PX: KNEE ARTHROPLASTY: SHX992

## 2017-12-12 LAB — TYPE AND SCREEN
ABO/RH(D): O POS
Antibody Screen: NEGATIVE

## 2017-12-12 LAB — GLUCOSE, CAPILLARY
GLUCOSE-CAPILLARY: 145 mg/dL — AB (ref 70–99)
GLUCOSE-CAPILLARY: 138 mg/dL — AB (ref 70–99)
Glucose-Capillary: 137 mg/dL — ABNORMAL HIGH (ref 70–99)
Glucose-Capillary: 109 mg/dL — ABNORMAL HIGH (ref 70–99)

## 2017-12-12 SURGERY — ARTHROPLASTY, KNEE, TOTAL, USING IMAGELESS COMPUTER-ASSISTED NAVIGATION
Anesthesia: Spinal | Site: Knee | Laterality: Right

## 2017-12-12 MED ORDER — HYDROCODONE-ACETAMINOPHEN 7.5-325 MG PO TABS
1.0000 | ORAL_TABLET | ORAL | Status: DC | PRN
  Administered 2017-12-13: 2 via ORAL
  Filled 2017-12-12: qty 2

## 2017-12-12 MED ORDER — DIPHENHYDRAMINE HCL 12.5 MG/5ML PO ELIX: 12.5000 mg | ORAL_SOLUTION | ORAL | Status: DC | PRN

## 2017-12-12 MED ORDER — ONDANSETRON HCL 4 MG PO TABS
4.0000 mg | ORAL_TABLET | Freq: Four times a day (QID) | ORAL | Status: DC | PRN
  Administered 2017-12-12: 4 mg via ORAL
  Filled 2017-12-12: qty 1

## 2017-12-12 MED ORDER — METOCLOPRAMIDE HCL 5 MG/ML IJ SOLN: 5.0000 mg | Freq: Three times a day (TID) | INTRAMUSCULAR | Status: DC | PRN

## 2017-12-12 MED ORDER — LATANOPROST 0.005 % OP SOLN
1.0000 [drp] | Freq: Every day | OPHTHALMIC | Status: DC
  Administered 2017-12-12 – 2017-12-13 (×2): 1 [drp] via OPHTHALMIC
  Filled 2017-12-12: qty 2.5

## 2017-12-12 MED ORDER — SENNA 8.6 MG PO TABS
1.0000 | ORAL_TABLET | Freq: Two times a day (BID) | ORAL | Status: DC
  Administered 2017-12-12 – 2017-12-14 (×4): 8.6 mg via ORAL
  Filled 2017-12-12 (×4): qty 1

## 2017-12-12 MED ORDER — MIDAZOLAM HCL 2 MG/2ML IJ SOLN: 1.0000 mg | INTRAMUSCULAR | Status: DC

## 2017-12-12 MED ORDER — ROPIVACAINE HCL 7.5 MG/ML IJ SOLN
INTRAMUSCULAR | Status: DC | PRN
  Administered 2017-12-12: 20 mL via PERINEURAL

## 2017-12-12 MED ORDER — MIDAZOLAM HCL 2 MG/2ML IJ SOLN
INTRAMUSCULAR | Status: AC
  Filled 2017-12-12: qty 2

## 2017-12-12 MED ORDER — KETOROLAC TROMETHAMINE 30 MG/ML IJ SOLN
INTRAMUSCULAR | Status: AC
  Filled 2017-12-12: qty 1

## 2017-12-12 MED ORDER — TRANEXAMIC ACID 1000 MG/10ML IV SOLN
1000.0000 mg | INTRAVENOUS | Status: AC
  Administered 2017-12-12: 1000 mg via INTRAVENOUS
  Filled 2017-12-12: qty 10

## 2017-12-12 MED ORDER — PROPOFOL 10 MG/ML IV BOLUS
INTRAVENOUS | Status: DC | PRN
  Administered 2017-12-12: 20 mg via INTRAVENOUS
  Administered 2017-12-12: 10 mg via INTRAVENOUS
  Administered 2017-12-12: 20 mg via INTRAVENOUS

## 2017-12-12 MED ORDER — PROPOFOL 10 MG/ML IV BOLUS
INTRAVENOUS | Status: AC
  Filled 2017-12-12: qty 20

## 2017-12-12 MED ORDER — ATORVASTATIN CALCIUM 40 MG PO TABS
40.0000 mg | ORAL_TABLET | Freq: Every evening | ORAL | Status: DC
  Administered 2017-12-12 – 2017-12-13 (×2): 40 mg via ORAL
  Filled 2017-12-12 (×2): qty 1

## 2017-12-12 MED ORDER — HYDROCODONE-ACETAMINOPHEN 5-325 MG PO TABS
1.0000 | ORAL_TABLET | ORAL | Status: DC | PRN
  Administered 2017-12-12 – 2017-12-14 (×6): 2 via ORAL
  Filled 2017-12-12 (×6): qty 2

## 2017-12-12 MED ORDER — CLINDAMYCIN PHOSPHATE 600 MG/50ML IV SOLN
600.0000 mg | Freq: Four times a day (QID) | INTRAVENOUS | Status: AC
  Administered 2017-12-12 (×2): 600 mg via INTRAVENOUS
  Filled 2017-12-12 (×2): qty 50

## 2017-12-12 MED ORDER — METHOCARBAMOL 500 MG PO TABS
500.0000 mg | ORAL_TABLET | Freq: Four times a day (QID) | ORAL | Status: DC | PRN
  Administered 2017-12-12 – 2017-12-13 (×3): 500 mg via ORAL
  Filled 2017-12-12 (×4): qty 1

## 2017-12-12 MED ORDER — ACETAMINOPHEN 10 MG/ML IV SOLN
1000.0000 mg | INTRAVENOUS | Status: AC
  Administered 2017-12-12: 1000 mg via INTRAVENOUS
  Filled 2017-12-12: qty 100

## 2017-12-12 MED ORDER — ALUM & MAG HYDROXIDE-SIMETH 200-200-20 MG/5ML PO SUSP: 30.0000 mL | ORAL | Status: DC | PRN

## 2017-12-12 MED ORDER — PROPOFOL 10 MG/ML IV BOLUS
INTRAVENOUS | Status: AC
  Filled 2017-12-12: qty 40

## 2017-12-12 MED ORDER — SODIUM CHLORIDE 0.9 % IR SOLN
Status: DC | PRN
  Administered 2017-12-12: 1000 mL
  Administered 2017-12-12: 3000 mL

## 2017-12-12 MED ORDER — SODIUM CHLORIDE 0.9 % IV SOLN: INTRAVENOUS | Status: DC

## 2017-12-12 MED ORDER — METOCLOPRAMIDE HCL 5 MG/ML IJ SOLN: 10.0000 mg | Freq: Once | INTRAMUSCULAR | Status: DC | PRN

## 2017-12-12 MED ORDER — BUPIVACAINE IN DEXTROSE 0.75-8.25 % IT SOLN
INTRATHECAL | Status: DC | PRN
  Administered 2017-12-12: 1.5 mL via INTRATHECAL

## 2017-12-12 MED ORDER — METHOCARBAMOL 500 MG IVPB - SIMPLE MED
500.0000 mg | Freq: Four times a day (QID) | INTRAVENOUS | Status: DC | PRN
  Administered 2017-12-12: 500 mg via INTRAVENOUS
  Filled 2017-12-12: qty 50

## 2017-12-12 MED ORDER — FENTANYL CITRATE (PF) 100 MCG/2ML IJ SOLN
50.0000 ug | INTRAMUSCULAR | Status: AC
  Administered 2017-12-12: 50 ug via INTRAVENOUS
  Filled 2017-12-12: qty 2

## 2017-12-12 MED ORDER — SODIUM CHLORIDE 0.9 % IJ SOLN
INTRAMUSCULAR | Status: DC | PRN
  Administered 2017-12-12: 30 mL

## 2017-12-12 MED ORDER — CHLORHEXIDINE GLUCONATE 4 % EX LIQD: 60.0000 mL | Freq: Once | CUTANEOUS | Status: DC

## 2017-12-12 MED ORDER — LACTATED RINGERS IV SOLN
INTRAVENOUS | Status: DC
  Administered 2017-12-12 (×3): via INTRAVENOUS

## 2017-12-12 MED ORDER — DOCUSATE SODIUM 100 MG PO CAPS
100.0000 mg | ORAL_CAPSULE | Freq: Two times a day (BID) | ORAL | Status: DC
  Administered 2017-12-12 – 2017-12-14 (×4): 100 mg via ORAL
  Filled 2017-12-12 (×4): qty 1

## 2017-12-12 MED ORDER — ENALAPRIL MALEATE 2.5 MG PO TABS
2.5000 mg | ORAL_TABLET | Freq: Every morning | ORAL | Status: DC
  Administered 2017-12-13 – 2017-12-14 (×2): 2.5 mg via ORAL
  Filled 2017-12-12 (×2): qty 1

## 2017-12-12 MED ORDER — BUPIVACAINE-EPINEPHRINE (PF) 0.25% -1:200000 IJ SOLN
INTRAMUSCULAR | Status: AC
  Filled 2017-12-12: qty 30

## 2017-12-12 MED ORDER — FENTANYL CITRATE (PF) 250 MCG/5ML IJ SOLN
INTRAMUSCULAR | Status: AC
  Filled 2017-12-12: qty 5

## 2017-12-12 MED ORDER — METHOCARBAMOL 500 MG IVPB - SIMPLE MED
INTRAVENOUS | Status: AC
  Filled 2017-12-12: qty 50

## 2017-12-12 MED ORDER — ISOPROPYL ALCOHOL 70 % SOLN
Status: DC | PRN
  Administered 2017-12-12: 1 via TOPICAL

## 2017-12-12 MED ORDER — SODIUM CHLORIDE 0.9 % IJ SOLN
INTRAMUSCULAR | Status: AC
  Filled 2017-12-12: qty 50

## 2017-12-12 MED ORDER — PROPOFOL 500 MG/50ML IV EMUL
INTRAVENOUS | Status: DC | PRN
  Administered 2017-12-12 (×2): 50 ug/kg/min via INTRAVENOUS

## 2017-12-12 MED ORDER — ASPIRIN 81 MG PO CHEW
81.0000 mg | CHEWABLE_TABLET | Freq: Two times a day (BID) | ORAL | Status: DC
  Administered 2017-12-12 – 2017-12-14 (×4): 81 mg via ORAL
  Filled 2017-12-12 (×4): qty 1

## 2017-12-12 MED ORDER — MORPHINE SULFATE (PF) 2 MG/ML IV SOLN: 0.5000 mg | INTRAVENOUS | Status: DC | PRN

## 2017-12-12 MED ORDER — FENTANYL CITRATE (PF) 100 MCG/2ML IJ SOLN
25.0000 ug | INTRAMUSCULAR | Status: DC | PRN
  Administered 2017-12-12: 25 ug via INTRAVENOUS

## 2017-12-12 MED ORDER — MEPERIDINE HCL 50 MG/ML IJ SOLN: 6.2500 mg | INTRAMUSCULAR | Status: DC | PRN

## 2017-12-12 MED ORDER — ONDANSETRON HCL 4 MG/2ML IJ SOLN
4.0000 mg | Freq: Four times a day (QID) | INTRAMUSCULAR | Status: DC | PRN
  Administered 2017-12-13: 4 mg via INTRAVENOUS
  Filled 2017-12-12: qty 2

## 2017-12-12 MED ORDER — LINAGLIPTIN 5 MG PO TABS
5.0000 mg | ORAL_TABLET | Freq: Every day | ORAL | Status: DC
  Administered 2017-12-13 – 2017-12-14 (×2): 5 mg via ORAL
  Filled 2017-12-12 (×2): qty 1

## 2017-12-12 MED ORDER — FUROSEMIDE 20 MG PO TABS
20.0000 mg | ORAL_TABLET | Freq: Every morning | ORAL | Status: DC
  Administered 2017-12-13 – 2017-12-14 (×2): 20 mg via ORAL
  Filled 2017-12-12 (×2): qty 1

## 2017-12-12 MED ORDER — METOCLOPRAMIDE HCL 5 MG PO TABS: 5.0000 mg | ORAL_TABLET | Freq: Three times a day (TID) | ORAL | Status: DC | PRN

## 2017-12-12 MED ORDER — POVIDONE-IODINE 10 % EX SWAB
2.0000 "application " | Freq: Once | CUTANEOUS | Status: AC
  Administered 2017-12-12: 2 via TOPICAL

## 2017-12-12 MED ORDER — POLYETHYLENE GLYCOL 3350 17 G PO PACK: 17.0000 g | PACK | Freq: Every day | ORAL | Status: DC | PRN

## 2017-12-12 MED ORDER — MENTHOL 3 MG MT LOZG: 1.0000 | LOZENGE | OROMUCOSAL | Status: DC | PRN

## 2017-12-12 MED ORDER — LIDOCAINE 2% (20 MG/ML) 5 ML SYRINGE
INTRAMUSCULAR | Status: DC | PRN
  Administered 2017-12-12: 60 mg via INTRAVENOUS

## 2017-12-12 MED ORDER — ACETAMINOPHEN 325 MG PO TABS: 325.0000 mg | ORAL_TABLET | Freq: Four times a day (QID) | ORAL | Status: DC | PRN

## 2017-12-12 MED ORDER — SODIUM CHLORIDE 0.9 % IV SOLN
INTRAVENOUS | Status: DC | PRN
  Administered 2017-12-12: 10 ug/min via INTRAVENOUS

## 2017-12-12 MED ORDER — BUPIVACAINE-EPINEPHRINE 0.25% -1:200000 IJ SOLN
INTRAMUSCULAR | Status: DC | PRN
  Administered 2017-12-12: 30 mL

## 2017-12-12 MED ORDER — CLINDAMYCIN PHOSPHATE 900 MG/50ML IV SOLN
900.0000 mg | INTRAVENOUS | Status: AC
  Administered 2017-12-12: 900 mg via INTRAVENOUS
  Filled 2017-12-12: qty 50

## 2017-12-12 MED ORDER — TIMOLOL MALEATE 0.5 % OP SOLN
1.0000 [drp] | Freq: Two times a day (BID) | OPHTHALMIC | Status: DC
  Administered 2017-12-12 – 2017-12-14 (×4): 1 [drp] via OPHTHALMIC
  Filled 2017-12-12: qty 5

## 2017-12-12 MED ORDER — SODIUM CHLORIDE 0.9 % IV SOLN
INTRAVENOUS | Status: DC
  Administered 2017-12-12 (×2): via INTRAVENOUS

## 2017-12-12 MED ORDER — STERILE WATER FOR IRRIGATION IR SOLN
Status: DC | PRN
  Administered 2017-12-12: 2000 mL

## 2017-12-12 MED ORDER — PHENOL 1.4 % MT LIQD: 1.0000 | OROMUCOSAL | Status: DC | PRN

## 2017-12-12 MED ORDER — EPHEDRINE SULFATE-NACL 50-0.9 MG/10ML-% IV SOSY
PREFILLED_SYRINGE | INTRAVENOUS | Status: DC | PRN
  Administered 2017-12-12 (×5): 5 mg via INTRAVENOUS

## 2017-12-12 MED ORDER — METFORMIN HCL 500 MG PO TABS
1000.0000 mg | ORAL_TABLET | Freq: Two times a day (BID) | ORAL | Status: DC
  Administered 2017-12-12 – 2017-12-14 (×4): 1000 mg via ORAL
  Filled 2017-12-12 (×4): qty 2

## 2017-12-12 MED ORDER — APRACLONIDINE HCL 0.5 % OP SOLN
1.0000 [drp] | Freq: Two times a day (BID) | OPHTHALMIC | Status: DC
  Administered 2017-12-12 – 2017-12-14 (×4): 1 [drp] via OPHTHALMIC
  Filled 2017-12-12: qty 5

## 2017-12-12 MED ORDER — CLONIDINE HCL (ANALGESIA) 100 MCG/ML EP SOLN
EPIDURAL | Status: DC | PRN
  Administered 2017-12-12: 100 ug

## 2017-12-12 MED ORDER — FENTANYL CITRATE (PF) 250 MCG/5ML IJ SOLN
INTRAMUSCULAR | Status: DC | PRN
  Administered 2017-12-12 (×3): 25 ug via INTRAVENOUS
  Administered 2017-12-12: 50 ug via INTRAVENOUS
  Administered 2017-12-12 (×3): 25 ug via INTRAVENOUS

## 2017-12-12 MED ORDER — 0.9 % SODIUM CHLORIDE (POUR BTL) OPTIME
TOPICAL | Status: DC | PRN
  Administered 2017-12-12: 1000 mL

## 2017-12-12 MED ORDER — ALBUMIN HUMAN 5 % IV SOLN
INTRAVENOUS | Status: DC | PRN
  Administered 2017-12-12: 12:00:00 via INTRAVENOUS

## 2017-12-12 MED ORDER — KETOROLAC TROMETHAMINE 15 MG/ML IJ SOLN
7.5000 mg | Freq: Four times a day (QID) | INTRAMUSCULAR | Status: AC
  Administered 2017-12-12 – 2017-12-13 (×4): 7.5 mg via INTRAVENOUS
  Filled 2017-12-12 (×4): qty 1

## 2017-12-12 MED ORDER — KETOROLAC TROMETHAMINE 30 MG/ML IJ SOLN
INTRAMUSCULAR | Status: DC | PRN
  Administered 2017-12-12: 30 mg

## 2017-12-12 MED ORDER — MIRABEGRON ER 25 MG PO TB24
50.0000 mg | ORAL_TABLET | Freq: Every morning | ORAL | Status: DC
  Administered 2017-12-13 – 2017-12-14 (×2): 50 mg via ORAL
  Filled 2017-12-12 (×2): qty 2

## 2017-12-12 MED ORDER — FENTANYL CITRATE (PF) 100 MCG/2ML IJ SOLN
INTRAMUSCULAR | Status: AC
  Administered 2017-12-12: 25 ug via INTRAVENOUS
  Filled 2017-12-12: qty 2

## 2017-12-12 MED ORDER — INSULIN ASPART 100 UNIT/ML ~~LOC~~ SOLN
0.0000 [IU] | Freq: Three times a day (TID) | SUBCUTANEOUS | Status: DC
  Administered 2017-12-12 – 2017-12-13 (×2): 2 [IU] via SUBCUTANEOUS
  Administered 2017-12-13: 5 [IU] via SUBCUTANEOUS
  Administered 2017-12-13: 2 [IU] via SUBCUTANEOUS

## 2017-12-12 MED ORDER — DEXAMETHASONE SODIUM PHOSPHATE 10 MG/ML IJ SOLN
10.0000 mg | Freq: Once | INTRAMUSCULAR | Status: AC
  Administered 2017-12-13: 10 mg via INTRAVENOUS
  Filled 2017-12-12: qty 1

## 2017-12-12 MED ORDER — TIMOLOL HEMIHYDRATE 0.5 % OP SOLN: 1.0000 [drp] | Freq: Two times a day (BID) | OPHTHALMIC | Status: DC

## 2017-12-12 MED ORDER — PHENYLEPHRINE 40 MCG/ML (10ML) SYRINGE FOR IV PUSH (FOR BLOOD PRESSURE SUPPORT)
PREFILLED_SYRINGE | INTRAVENOUS | Status: DC | PRN
  Administered 2017-12-12: 80 ug via INTRAVENOUS

## 2017-12-12 SURGICAL SUPPLY — 62 items
ADH SKN CLS APL DERMABOND .7 (GAUZE/BANDAGES/DRESSINGS) ×2
BAG SPEC THK2 15X12 ZIP CLS (MISCELLANEOUS)
BAG ZIPLOCK 12X15 (MISCELLANEOUS) IMPLANT
BANDAGE ACE 4X5 VEL STRL LF (GAUZE/BANDAGES/DRESSINGS) ×2 IMPLANT
BANDAGE ACE 6X5 VEL STRL LF (GAUZE/BANDAGES/DRESSINGS) ×2 IMPLANT
BATTERY INSTRU NAVIGATION (MISCELLANEOUS) ×6 IMPLANT
BLADE SAW RECIPROCATING 77.5 (BLADE) ×2 IMPLANT
BSPLAT TIB 3 KN TRITANIUM (Knees) ×1 IMPLANT
BTRY SRG DRVR LF (MISCELLANEOUS) ×3
CHLORAPREP W/TINT 26ML (MISCELLANEOUS) ×4 IMPLANT
COMPONENT TRI CR RETAIN KNEE (Orthopedic Implant) ×1 IMPLANT
COVER SURGICAL LIGHT HANDLE (MISCELLANEOUS) ×2 IMPLANT
CUFF TOURN SGL QUICK 34 (TOURNIQUET CUFF) ×2
CUFF TRNQT CYL 34X4X40X1 (TOURNIQUET CUFF) ×1 IMPLANT
DECANTER SPIKE VIAL GLASS SM (MISCELLANEOUS) ×4 IMPLANT
DERMABOND ADVANCED (GAUZE/BANDAGES/DRESSINGS) ×2
DERMABOND ADVANCED .7 DNX12 (GAUZE/BANDAGES/DRESSINGS) ×2 IMPLANT
DRAPE SHEET LG 3/4 BI-LAMINATE (DRAPES) ×4 IMPLANT
DRAPE U-SHAPE 47X51 STRL (DRAPES) ×2 IMPLANT
DRSG AQUACEL AG ADV 3.5X10 (GAUZE/BANDAGES/DRESSINGS) ×2 IMPLANT
DRSG TEGADERM 4X4.75 (GAUZE/BANDAGES/DRESSINGS) IMPLANT
ELECT BLADE TIP CTD 4 INCH (ELECTRODE) ×2 IMPLANT
ELECT REM PT RETURN 15FT ADLT (MISCELLANEOUS) ×2 IMPLANT
EVACUATOR 1/8 PVC DRAIN (DRAIN) IMPLANT
GAUZE SPONGE 4X4 12PLY STRL (GAUZE/BANDAGES/DRESSINGS) ×2 IMPLANT
GLOVE BIO SURGEON STRL SZ8.5 (GLOVE) ×4 IMPLANT
GLOVE BIOGEL PI IND STRL 8.5 (GLOVE) ×1 IMPLANT
GLOVE BIOGEL PI INDICATOR 8.5 (GLOVE) ×1
GOWN SPEC L3 XXLG W/TWL (GOWN DISPOSABLE) ×2 IMPLANT
HANDPIECE INTERPULSE COAX TIP (DISPOSABLE) ×2
HOOD PEEL AWAY FLYTE STAYCOOL (MISCELLANEOUS) ×6 IMPLANT
INSERT TIBIA KNEE SZ 3 9MM (Insert) ×2 IMPLANT
KNEE PATELLA ASYMMETRIC 9X29 (Knees) ×2 IMPLANT
KNEE TIBIAL COMPONENT SZ3 (Knees) ×2 IMPLANT
MARKER SKIN DUAL TIP RULER LAB (MISCELLANEOUS) ×2 IMPLANT
NEEDLE SPNL 18GX3.5 QUINCKE PK (NEEDLE) ×2 IMPLANT
NS IRRIG 1000ML POUR BTL (IV SOLUTION) ×2 IMPLANT
PACK TOTAL KNEE CUSTOM (KITS) ×2 IMPLANT
PADDING CAST COTTON 6X4 STRL (CAST SUPPLIES) ×2 IMPLANT
POSITIONER SURGICAL ARM (MISCELLANEOUS) ×2 IMPLANT
SAW OSC TIP CART 19.5X105X1.3 (SAW) ×2 IMPLANT
SEALER BIPOLAR AQUA 6.0 (INSTRUMENTS) ×2 IMPLANT
SET HNDPC FAN SPRY TIP SCT (DISPOSABLE) ×1 IMPLANT
SET PAD KNEE POSITIONER (MISCELLANEOUS) ×2 IMPLANT
SPONGE DRAIN TRACH 4X4 STRL 2S (GAUZE/BANDAGES/DRESSINGS) IMPLANT
SPONGE LAP 18X18 RF (DISPOSABLE) IMPLANT
SUT MNCRL AB 3-0 PS2 18 (SUTURE) ×2 IMPLANT
SUT MON AB 2-0 CT1 36 (SUTURE) ×4 IMPLANT
SUT STRATAFIX PDO 1 14 VIOLET (SUTURE) ×2
SUT STRATFX PDO 1 14 VIOLET (SUTURE) ×1
SUT VIC AB 1 CTX 36 (SUTURE) ×4
SUT VIC AB 1 CTX36XBRD ANBCTR (SUTURE) ×2 IMPLANT
SUT VIC AB 2-0 CT1 27 (SUTURE) ×2
SUT VIC AB 2-0 CT1 TAPERPNT 27 (SUTURE) ×1 IMPLANT
SUTURE STRATFX PDO 1 14 VIOLET (SUTURE) ×1 IMPLANT
SYR 50ML LL SCALE MARK (SYRINGE) ×2 IMPLANT
TOWER CARTRIDGE SMART MIX (DISPOSABLE) IMPLANT
TRAY FOLEY MTR SLVR 16FR STAT (SET/KITS/TRAYS/PACK) IMPLANT
TRIA CRUCIATE RETAIN KNEE (Orthopedic Implant) ×2 IMPLANT
WATER STERILE IRR 1000ML POUR (IV SOLUTION) ×4 IMPLANT
WRAP KNEE MAXI GEL POST OP (GAUZE/BANDAGES/DRESSINGS) ×2 IMPLANT
YANKAUER SUCT BULB TIP 10FT TU (MISCELLANEOUS) ×2 IMPLANT

## 2017-12-12 NOTE — Anesthesia Procedure Notes (Signed)
Procedure Name: MAC Date/Time: 12/12/2017 11:10 AM Performed by: Cynda Familia, CRNA Pre-anesthesia Checklist: Patient identified, Emergency Drugs available, Suction available, Patient being monitored and Timeout performed Oxygen Delivery Method: Simple face mask Placement Confirmation: CO2 detector and breath sounds checked- equal and bilateral Dental Injury: Teeth and Oropharynx as per pre-operative assessment

## 2017-12-12 NOTE — Op Note (Signed)
OPERATIVE REPORT  SURGEON: Rod Can, MD   ASSISTANT: Nehemiah Massed, PA-C.  PREOPERATIVE DIAGNOSIS: Right knee arthritis.   POSTOPERATIVE DIAGNOSIS: Right knee arthritis.   PROCEDURE: Right total knee arthroplasty.   IMPLANTS: Stryker Triathlon CR femur, size 3. Stryker Tritanium tibia, size 3. X3 polyethelyene insert, size 9 mm, CR. 3 button asymmetric patella, size 29 mm.  ANESTHESIA:  MAC, Regional and Spinal  TOURNIQUET TIME: Not utilized.   ESTIMATED BLOOD LOSS:-500 mL    ANTIBIOTICS: 900 mg clindamyin.  DRAINS: None.  COMPLICATIONS: None   CONDITION: PACU - hemodynamically stable.   BRIEF CLINICAL NOTE: Lindsey Baldwin is a 72 y.o. female with a long-standing history of Right knee arthritis. After failing conservative management, the patient was indicated for total knee arthroplasty. The risks, benefits, and alternatives to the procedure were explained, and the patient elected to proceed.  PROCEDURE IN DETAIL: Adductor canal block was obtained in the pre-op holding area. Once inside the operative room, spinal anesthesia was obtained, and a foley catheter was inserted. The patient was then positioned, a nonsterile tourniquet was placed, and the lower extremity was prepped and draped in the normal sterile surgical fashion. A time-out was called verifying side and site of surgery. The patient received IV antibiotics within 60 minutes of beginning the procedure. The tourniquet was not utilized.  An anterior approach to the knee was performed utilizing a midvastus arthrotomy. A medial release was performed and the patellar fat pad was excised. Stryker navigation was used to cut the distal femur perpendicular to the mechanical axis. A freehand patellar resection was performed, and the patella was sized an prepared with 3 lug holes.  Nagivation was used to make a neutral  proximal tibia resection, taking 3 mm of bone from the less affected medial side with 3 degrees of slope. The menisci were excised. A spacer block was placed, and the alignment and balance in extension were confirmed.   The distal femur was sized using the 3-degree external rotation guide referencing the posterior femoral cortex. The appropriate 4-in-1 cutting block was pinned into place. Rotation was checked using Whiteside's line, the epicondylar axis, and then confirmed with a spacer block in flexion. The remaining femoral cuts were performed, taking care to protect the MCL.  The tibia was sized and the trial tray was pinned into place. The remaining trail components were inserted. The knee was stable to varus and valgus stress through a full range of motion. The patella tracked centrally, and the PCL was well balanced. The trial components were removed, and the proximal tibial surface was prepared. Final components were impacted into place. The knee was tested for a final time and found to be well balanced.  The wound was copiously irrigated with normal saline with pulse lavage. Marcaine solution was injected into the periarticular soft tissue. The wound was closed in layers using #1 Vicryl and Stratafix for the fascia, 2-0 Vicryl for the subcutaneous fat, 2-0 Monocryl for the deep dermal layer, 3-0 running Monocryl subcuticular Stitch, and Dermabond for the skin. Once the glue was fully dried, an Aquacell Ag and compressive dressing were applied. Tthe patient was transported to the recovery room in stable condition. Sponge, needle, and instrument counts were correct at the end of the case x2. The patient tolerated the procedure well and there were no known complications.  Please note that a surgical assistant was a medical necessity for this procedure in order to perform it in a safe and expeditious manner. Surgical assistant was  necessary to retract the ligaments and vital neurovascular  structures to prevent injury to them and also necessary for proper positioning of the limb to allow for anatomic placement of the prosthesis.

## 2017-12-12 NOTE — Anesthesia Procedure Notes (Signed)
Spinal  Patient location during procedure: OR Start time: 12/12/2017 11:20 AM Staffing Resident/CRNA: Cynda Familia, CRNA Performed: resident/CRNA  Preanesthetic Checklist Completed: patient identified, site marked, surgical consent, pre-op evaluation, timeout performed, IV checked, risks and benefits discussed and monitors and equipment checked Spinal Block Patient position: sitting Prep: ChloraPrep and site prepped and draped Patient monitoring: heart rate, continuous pulse ox, blood pressure and cardiac monitor Approach: midline Location: L2-3 Injection technique: single-shot Needle Needle type: Sprotte  Needle gauge: 24 G Assessment Sensory level: T4 Additional Notes Expiration date of tray noted and within date.   Patient tolerated procedure well. [place] present assisted and supervised -- prep dry at time of insertion

## 2017-12-12 NOTE — Anesthesia Postprocedure Evaluation (Signed)
Anesthesia Post Note  Patient: Lindsey Baldwin  Procedure(s) Performed: RIGHT TOTAL KNEE ARTHROPLASTY WITH COMPUTER NAVIGATION (Right Knee)     Patient location during evaluation: PACU Anesthesia Type: Spinal Level of consciousness: awake and alert Pain management: pain level controlled Vital Signs Assessment: post-procedure vital signs reviewed and stable Respiratory status: spontaneous breathing, nonlabored ventilation, respiratory function stable and patient connected to nasal cannula oxygen Cardiovascular status: blood pressure returned to baseline and stable Postop Assessment: no apparent nausea or vomiting Anesthetic complications: no    Last Vitals:  Vitals:   12/12/17 1500 12/12/17 1513  BP: 119/65 104/61  Pulse: 60 (!) 59  Resp: 17 17  Temp: 36.5 C 36.4 C  SpO2: 97% 99%    Last Pain:  Vitals:   12/12/17 1513  TempSrc: Oral  PainSc: 0-No pain                 Montez Hageman

## 2017-12-12 NOTE — Anesthesia Preprocedure Evaluation (Addendum)
Anesthesia Evaluation  Patient identified by MRN, date of birth, ID band Patient awake    Reviewed: Allergy & Precautions, NPO status , Patient's Chart, lab work & pertinent test results  Airway Mallampati: II  TM Distance: >3 FB Neck ROM: Full    Dental no notable dental hx. (+) Dental Advisory Given   Pulmonary sleep apnea ,    Pulmonary exam normal breath sounds clear to auscultation       Cardiovascular hypertension, Pt. on medications Normal cardiovascular exam Rhythm:Regular Rate:Normal     Neuro/Psych negative neurological ROS  negative psych ROS   GI/Hepatic negative GI ROS, Neg liver ROS,   Endo/Other  diabetes, Type 2, Oral Hypoglycemic Agents  Renal/GU negative Renal ROS  negative genitourinary   Musculoskeletal negative musculoskeletal ROS (+)   Abdominal   Peds negative pediatric ROS (+)  Hematology negative hematology ROS (+)   Anesthesia Other Findings   Reproductive/Obstetrics negative OB ROS                            Anesthesia Physical Anesthesia Plan  ASA: II  Anesthesia Plan: Spinal   Post-op Pain Management:  Regional for Post-op pain   Induction: Intravenous  PONV Risk Score and Plan: 2 and Ondansetron and Treatment may vary due to age or medical condition  Airway Management Planned: Simple Face Mask  Additional Equipment:   Intra-op Plan:   Post-operative Plan:   Informed Consent: I have reviewed the patients History and Physical, chart, labs and discussed the procedure including the risks, benefits and alternatives for the proposed anesthesia with the patient or authorized representative who has indicated his/her understanding and acceptance.   Dental advisory given  Plan Discussed with: CRNA  Anesthesia Plan Comments:         Anesthesia Quick Evaluation

## 2017-12-12 NOTE — Discharge Instructions (Signed)
° °Dr. Valery Chance °Total Joint Specialist °[facility] °[address] °[place], [place] [address] °[phone] ° °TOTAL KNEE REPLACEMENT POSTOPERATIVE DIRECTIONS ° ° ° °Knee Rehabilitation, Guidelines Following Surgery  °Results after knee surgery are often greatly improved when you follow the exercise, range of motion and muscle strengthening exercises prescribed by your doctor. Safety measures are also important to protect the knee from further injury. Any time any of these exercises cause you to have increased pain or swelling in your knee joint, decrease the amount until you are comfortable again and slowly increase them. If you have problems or questions, call your caregiver or physical therapist for advice.  ° °WEIGHT BEARING °Weight bearing as tolerated with assist device (walker, cane, etc) as directed, use it as long as suggested by your surgeon or therapist, typically at least 4-6 weeks. ° °HOME CARE INSTRUCTIONS  °Remove items at home which could result in a fall. This includes throw rugs or furniture in walking pathways.  °Continue medications as instructed at time of discharge. °You may have some home medications which will be placed on hold until you complete the course of blood thinner medication.  °You may start showering once you are discharged home but do not submerge the incision under water. Just pat the incision dry and apply a dry gauze dressing on daily. °Walk with walker as instructed.  °You may resume a sexual relationship in one month or when given the OK by your doctor.  °· Use walker as long as suggested by your caregivers. °· Avoid periods of inactivity such as sitting longer than an hour when not asleep. This helps prevent blood clots.  °You may put full weight on your legs and walk as much as is comfortable.  °You may return to work once you are cleared by your doctor.  °Do not drive a car for 6 weeks or until released by you surgeon.  °· Do not drive  while taking narcotics.  °Wear the elastic stockings for three weeks following surgery during the day but you may remove then at night. °Make sure you keep all of your appointments after your operation with all of your doctors and caregivers. You should call the office at the above phone number and make an appointment for approximately two weeks after the date of your surgery. °Do not remove your surgical dressing. The dressing is waterproof; you may take showers in 3 days, but do not take tub baths or submerge the dressing. °Please pick up a stool softener and laxative for home use as long as you are requiring pain medications. °· ICE to the affected knee every three hours for 30 minutes at a time and then as needed for pain and swelling.  Continue to use ice on the knee for pain and swelling from surgery. You may notice swelling that will progress down to the foot and ankle.  This is normal after surgery.  Elevate the leg when you are not up walking on it.   °It is important for you to complete the blood thinner medication as prescribed by your doctor. °· Continue to use the breathing machine which will help keep your temperature down.  It is common for your temperature to cycle up and down following surgery, especially at night when you are not up moving around and exerting yourself.  The breathing machine keeps your lungs expanded and your temperature down. ° °RANGE OF MOTION AND STRENGTHENING EXERCISES  °Rehabilitation of the knee is important following   a knee injury or an operation. After just a few days of immobilization, the muscles of the thigh which control the knee become weakened and shrink (atrophy). Knee exercises are designed to build up the tone and strength of the thigh muscles and to improve knee motion. Often times heat used for twenty to thirty minutes before working out will loosen up your tissues and help with improving the range of motion but do not use heat for the first two weeks following  surgery. These exercises can be done on a training (exercise) mat, on the floor, on a table or on a bed. Use what ever works the best and is most comfortable for you Knee exercises include:  °Leg Lifts - While your knee is still immobilized in a splint or cast, you can do straight leg raises. Lift the leg to 60 degrees, hold for 3 sec, and slowly lower the leg. Repeat 10-20 times 2-3 times daily. Perform this exercise against resistance later as your knee gets better.  °Quad and Hamstring Sets - Tighten up the muscle on the front of the thigh (Quad) and hold for 5-10 sec. Repeat this 10-20 times hourly. Hamstring sets are done by pushing the foot backward against an object and holding for 5-10 sec. Repeat as with quad sets.  °A rehabilitation program following serious knee injuries can speed recovery and prevent re-injury in the future due to weakened muscles. Contact your doctor or a physical therapist for more information on knee rehabilitation.  ° °SKILLED REHAB INSTRUCTIONS: °If the patient is transferred to a skilled rehab facility following release from the hospital, a list of the current medications will be sent to the facility for the patient to continue.  When discharged from the skilled rehab facility, please have the facility set up the patient's Home Health Physical Therapy prior to being released. Also, the skilled facility will be responsible for providing the patient with their medications at time of release from the facility to include their pain medication, the muscle relaxants, and their blood thinner medication. If the patient is still at the rehab facility at time of the two week follow up appointment, the skilled rehab facility will also need to assist the patient in arranging follow up appointment in our office and any transportation needs. ° °MAKE SURE YOU:  °Understand these instructions.  °Will watch your condition.  °Will get help right away if you are not doing well or get worse.   ° ° °Pick up stool softner and laxative for home use following surgery while on pain medications. °Do NOT remove your dressing. You may shower.  °Do not take tub baths or submerge incision under water. °May shower starting three days after surgery. °Please use a clean towel to pat the incision dry following showers. °Continue to use ice for pain and swelling after surgery. °Do not use any lotions or creams on the incision until instructed by your surgeon. ° °

## 2017-12-12 NOTE — Evaluation (Signed)
Physical Therapy Evaluation Patient Details Name: Lindsey Baldwin MRN: [id number] DOB: Nov 19, 1945 Today's Date: 12/12/2017   History of Present Illness  Pt is a 72 YO female s/p R TKR on 9/12. PMH includes L eye blindness, GERD, R eye glaucoma, HTN, OA, OSA on CPAP, OP, DM II.  Clinical Impression  Pt s/p R TKR. Pt presents with difficulty with mobility tasks, R knee pain, decreased endurance for activity, and lack of knowledge of mobility with TKR. Pt to benefit from acute PT to address deficits. Pt ambulated short distance in room today, limited by R knee pain. PT to progress mobility and to continue to follow acutely.     Follow Up Recommendations Follow surgeon's recommendation for DC plan and follow-up therapies;Supervision for mobility/OOB(HHPT within ALF versus OPPT)    Equipment Recommendations  None recommended by PT    Recommendations for Other Services       Precautions / Restrictions Precautions Precautions: Fall Restrictions Weight Bearing Restrictions: No RLE Weight Bearing: Weight bearing as tolerated      Mobility  Bed Mobility Overal bed mobility: Needs Assistance Bed Mobility: Supine to Sit     Supine to sit: Min assist     General bed mobility comments: Min assist for LE management and trunk elevation. Verbal cuing throughout for sequencing.   Transfers Overall transfer level: Needs assistance Equipment used: Rolling walker (2 wheeled) Transfers: Sit to/from Stand Sit to Stand: Min assist         General transfer comment: Min assist for power up and steadying upon standing. Verbal cuing for hand placement, WBAT status of RLE.   Ambulation/Gait Ambulation/Gait assistance: Min assist Gait Distance (Feet): 6 Feet Assistive device: Rolling walker (2 wheeled) Gait Pattern/deviations: Step-to pattern;Decreased stride length;Decreased weight shift to right;Decreased stance time - right;Wide base of support;Trunk flexed Gait velocity: Very decreased,  limited by pain    General Gait Details: Min assist for steadying and RLE guarding to prevent knee buckling intially. RLE guarding decreased when no knee buckling present. Verbal cuing for sequencing, placement in RW.   Stairs            Wheelchair Mobility    Modified Rankin (Stroke Patients Only)       Balance Overall balance assessment: Mild deficits observed, not formally tested                                           Pertinent Vitals/Pain Pain Assessment: 0-10 Pain Score: 4  Pain Location: R knee, with mobility  Pain Descriptors / Indicators: Throbbing Pain Intervention(s): Limited activity within patient's tolerance;Repositioned;Monitored during session    [place] expects to be discharged to:: Assisted living(Pt lives in independent section of community )               Home Equipment: Gilford Rile - 2 wheels;Walker - 4 wheels;Bedside commode;Cane - single point;Shower seat - built in      Prior Function Level of Independence: Independent with assistive device(s)         Comments: pt uses rollator in the apartment, uses cane when in community      Hand Dominance   Dominant Hand: Right    Extremity/Trunk Assessment   Upper Extremity Assessment Upper Extremity Assessment: Overall WFL for tasks assessed    Lower Extremity Assessment Lower Extremity Assessment: Generalized weakness(RLE: able to perform quad sets x5, ankle pumps, SLR  x2 with AA)    Cervical / Trunk Assessment Cervical / Trunk Assessment: Other exceptions Cervical / Trunk Exceptions: forward flexed trunk  Communication   Communication: No difficulties  Cognition Arousal/Alertness: Awake/alert Behavior During Therapy: WFL for tasks assessed/performed Overall Cognitive Status: Within Functional Limits for tasks assessed                                        General Comments      Exercises Total Joint Exercises Ankle  Circles/Pumps: AROM;Both;10 reps;Supine Quad Sets: AROM;Right;5 reps;Supine Heel Slides: AAROM;Right;Supine(3 reps) Straight Leg Raises: AAROM;Right;Supine(2 reps) Goniometric ROM: 10-40 degrees knee AAROM, limited by pain    Assessment/Plan    PT Assessment Patient needs continued PT services  PT Problem List Decreased strength;Pain;Decreased range of motion;Decreased activity tolerance;Decreased knowledge of use of DME;Decreased balance;Decreased safety awareness;Decreased mobility       PT Treatment Interventions DME instruction;Therapeutic activities;Gait training;Therapeutic exercise;Patient/family education;Stair training;Balance training;Functional mobility training    PT Goals (Current goals can be found in the Care Plan section)  Acute Rehab PT Goals PT Goal Formulation: With patient Time For Goal Achievement: 12/26/17 Potential to Achieve Goals: Good    Frequency 7X/week   Barriers to discharge        Co-evaluation               AM-PAC PT "6 Clicks" Daily Activity  Outcome Measure Difficulty turning over in bed (including adjusting bedclothes, sheets and blankets)?: Unable Difficulty moving from lying on back to sitting on the side of the bed? : Unable Difficulty sitting down on and standing up from a chair with arms (e.g., wheelchair, bedside commode, etc,.)?: Unable Help needed moving to and from a bed to chair (including a wheelchair)?: A Little Help needed walking in hospital room?: A Little Help needed climbing 3-5 steps with a railing? : A Lot 6 Click Score: 11    End of Session Equipment Utilized During Treatment: Gait belt Activity Tolerance: Patient tolerated treatment well;Patient limited by pain;Patient limited by fatigue Patient left: in chair;with chair alarm set;with family/visitor present;with call bell/phone within reach;with SCD's reapplied Nurse Communication: Mobility status PT Visit Diagnosis: Other abnormalities of gait and mobility  (R26.89);Difficulty in walking, not elsewhere classified (R26.2)    Time: 1610-9604 PT Time Calculation (min) (ACUTE ONLY): 31 min   Charges:   PT Evaluation $PT Eval Low Complexity: 1 Low PT Treatments $Gait Training: 8-22 mins       Beren Yniguez Conception Chancy, PT, DPT  Pager # [phone]    Jaiyon Wander D Tao Satz 12/12/2017, 7:50 PM

## 2017-12-12 NOTE — Progress Notes (Signed)
AssistedDr. Carignan with right, ultrasound guided, adductor canal block. Side rails up, monitors on throughout procedure. See vital signs in flow sheet. Tolerated Procedure well.  

## 2017-12-12 NOTE — Anesthesia Procedure Notes (Signed)
Anesthesia Regional Block: Adductor canal block   Pre-Anesthetic Checklist: ,, timeout performed, Correct Patient, Correct Site, Correct Laterality, Correct Procedure, Correct Position, site marked, Risks and benefits discussed,  Surgical consent,  Pre-op evaluation,  At surgeon's request and post-op pain management  Laterality: Right and Lower  Prep: Maximum Sterile Barrier Precautions used, chloraprep       Needles:  Injection technique: Single-shot  Needle Type: Echogenic Stimulator Needle     Needle Length: 10cm      Additional Needles:   Procedures:,,,, ultrasound used (permanent image in chart),,,,  Narrative:  Start time: 12/12/2017 9:04 AM End time: 12/12/2017 9:14 AM Injection made incrementally with aspirations every 5 mL.  Performed by: Personally  Anesthesiologist: Montez Hageman, MD  Additional Notes: Risks, benefits and alternative to block explained extensively.  Patient tolerated procedure well, without complications.

## 2017-12-12 NOTE — Transfer of Care (Signed)
Immediate Anesthesia Transfer of Care Note  Patient: Lindsey Baldwin  Procedure(s) Performed: RIGHT TOTAL KNEE ARTHROPLASTY WITH COMPUTER NAVIGATION (Right Knee)  Patient Location: PACU  Anesthesia Type:Spinal  Level of Consciousness: awake and alert   Airway & Oxygen Therapy: Patient Spontanous Breathing and Patient connected to face mask oxygen  Post-op Assessment: Report given to RN and Post -op Vital signs reviewed and stable  Post vital signs: Reviewed and stable  Last Vitals:  Vitals Value Taken Time  BP 107/67 12/12/2017  1:30 PM  Temp 36.8 C 12/12/2017  1:30 PM  Pulse 55 12/12/2017  1:38 PM  Resp 15 12/12/2017  1:38 PM  SpO2 100 % 12/12/2017  1:38 PM  Vitals shown include unvalidated device data.  Last Pain:  Vitals:   12/12/17 0825  TempSrc:   PainSc: 7       Patients Stated Pain Goal: 4 (27/51/70 0174)  Complications: No apparent anesthesia complications

## 2017-12-13 ENCOUNTER — Encounter (HOSPITAL_COMMUNITY): Payer: Self-pay | Admitting: Orthopedic Surgery

## 2017-12-13 LAB — BASIC METABOLIC PANEL
Anion gap: 7 (ref 5–15)
BUN: 12 mg/dL (ref 8–23)
CALCIUM: 8.5 mg/dL — AB (ref 8.9–10.3)
CO2: 26 mmol/L (ref 22–32)
CREATININE: 0.69 mg/dL (ref 0.44–1.00)
Chloride: 108 mmol/L (ref 98–111)
GFR calc Af Amer: >60 mL/min (ref >60)
GFR calc non Af Amer: >60 mL/min (ref >60)
Glucose, Bld: 161 mg/dL — ABNORMAL HIGH (ref 70–99)
Potassium: 4.5 mmol/L (ref 3.5–5.1)
SODIUM: 141 mmol/L (ref 135–145)

## 2017-12-13 LAB — GLUCOSE, CAPILLARY
GLUCOSE-CAPILLARY: 128 mg/dL — AB (ref 70–99)
GLUCOSE-CAPILLARY: 203 mg/dL — AB (ref 70–99)
GLUCOSE-CAPILLARY: 201 mg/dL — AB (ref 70–99)
Glucose-Capillary: 150 mg/dL — ABNORMAL HIGH (ref 70–99)

## 2017-12-13 LAB — CBC
HCT: 31.8 % — ABNORMAL LOW (ref 36.0–46.0)
Hemoglobin: 10.4 g/dL — ABNORMAL LOW (ref 12.0–15.0)
MCH: 30.2 pg (ref 26.0–34.0)
MCHC: 32.7 g/dL (ref 30.0–36.0)
MCV: 92.4 fL (ref 78.0–100.0)
PLATELETS: 168 10*3/uL (ref 150–400)
RBC: 3.44 MIL/uL — AB (ref 3.87–5.11)
RDW: 13.6 % (ref 11.5–15.5)
WBC: 7.8 10*3/uL (ref 4.0–10.5)

## 2017-12-13 MED ORDER — HYDROCODONE-ACETAMINOPHEN 5-325 MG PO TABS: 1.0000 | ORAL_TABLET | Freq: Four times a day (QID) | ORAL | 0 refills | Status: DC | PRN

## 2017-12-13 MED ORDER — ASPIRIN 81 MG PO CHEW: 81.0000 mg | CHEWABLE_TABLET | Freq: Two times a day (BID) | ORAL | 1 refills | Status: DC

## 2017-12-13 MED ORDER — SENNA 8.6 MG PO TABS: 1.0000 | ORAL_TABLET | Freq: Two times a day (BID) | ORAL | 0 refills | Status: DC

## 2017-12-13 MED ORDER — DOCUSATE SODIUM 100 MG PO CAPS: 100.0000 mg | ORAL_CAPSULE | Freq: Two times a day (BID) | ORAL | 1 refills | Status: DC

## 2017-12-13 MED ORDER — ONDANSETRON HCL 4 MG PO TABS: 4.0000 mg | ORAL_TABLET | Freq: Four times a day (QID) | ORAL | 0 refills | Status: DC | PRN

## 2017-12-13 NOTE — Progress Notes (Signed)
Physical Therapy Treatment Patient Details Name: Lindsey Baldwin MRN: [id number] DOB: 05-10-1945 Today's Date: 12/13/2017    History of Present Illness Pt is a 73 YO female s/p R TKR on 9/12. PMH includes L eye blindness, GERD, R eye glaucoma, HTN, OA, OSA on CPAP, OP, DM II.    PT Comments    Pt demonstrating marked improvement in activity and WB tolerance this pm and hopeful for return home tomorrow.   Follow Up Recommendations  Follow surgeon's recommendation for DC plan and follow-up therapies;Supervision for mobility/OOB     Equipment Recommendations  None recommended by PT    Recommendations for Other Services       Precautions / Restrictions Precautions Precautions: Fall Restrictions Weight Bearing Restrictions: No RLE Weight Bearing: Weight bearing as tolerated    Mobility  Bed Mobility Overal bed mobility: Needs Assistance Bed Mobility: Supine to Sit     Supine to sit: Min guard     General bed mobility comments: Increased time with cues for sequence and use of L LE to self assist  Transfers Overall transfer level: Needs assistance Equipment used: Rolling walker (2 wheeled) Transfers: Sit to/from Stand Sit to Stand: Min assist;From elevated surface         General transfer comment: Min assist for power up and steadying upon standing. Verbal cuing for hand placement, WBAT status of RLE.   Ambulation/Gait Ambulation/Gait assistance: Min assist;Min guard Gait Distance (Feet): 74 Feet Assistive device: Rolling walker (2 wheeled) Gait Pattern/deviations: Step-to pattern;Decreased stride length;Decreased weight shift to right;Decreased stance time - right;Wide base of support;Trunk flexed Gait velocity: cues for sequence, posture, stride length and position from RW.     General Gait Details: Min assist for steadying and RLE guarding to prevent knee buckling intially. RLE guarding decreased when no knee buckling present. Verbal cuing for sequencing,  placement in RW.    Stairs             Wheelchair Mobility    Modified Rankin (Stroke Patients Only)       Balance Overall balance assessment: Mild deficits observed, not formally tested                                          Cognition Arousal/Alertness: Awake/alert Behavior During Therapy: WFL for tasks assessed/performed Overall Cognitive Status: Within Functional Limits for tasks assessed                                        Exercises Total Joint Exercises Ankle Circles/Pumps: AROM;Both;Supine;15 reps Quad Sets: AROM;Right;Supine;10 reps Heel Slides: AAROM;Right;Supine;15 reps Straight Leg Raises: AAROM;Right;Supine;10 reps Goniometric ROM: R knee -10- 55    General Comments        Pertinent Vitals/Pain Pain Assessment: 0-10 Pain Score: 4  Pain Location: R knee, with mobility  Pain Descriptors / Indicators: Throbbing Pain Intervention(s): Limited activity within patient's tolerance;Monitored during session;Premedicated before session;Ice applied    Home Living                      Prior Function            PT Goals (current goals can now be found in the care plan section) Acute Rehab PT Goals Patient Stated Goal: Regain IND PT Goal Formulation: With patient Time  For Goal Achievement: 12/26/17 Potential to Achieve Goals: Good Progress towards PT goals: Progressing toward goals    Frequency    7X/week      PT Plan Current plan remains appropriate    Co-evaluation              AM-PAC PT "6 Clicks" Daily Activity  Outcome Measure  Difficulty turning over in bed (including adjusting bedclothes, sheets and blankets)?: A Lot Difficulty moving from lying on back to sitting on the side of the bed? : A Lot Difficulty sitting down on and standing up from a chair with arms (e.g., wheelchair, bedside commode, etc,.)?: Unable Help needed moving to and from a bed to chair (including a  wheelchair)?: A Little Help needed walking in hospital room?: A Little Help needed climbing 3-5 steps with a railing? : A Lot 6 Click Score: 13    End of Session Equipment Utilized During Treatment: Gait belt Activity Tolerance: Patient tolerated treatment well;Patient limited by pain;Patient limited by fatigue Patient left: in chair;with chair alarm set;with call bell/phone within reach Nurse Communication: Mobility status PT Visit Diagnosis: Other abnormalities of gait and mobility (R26.89);Difficulty in walking, not elsewhere classified (R26.2)     Time: 9924-2683 PT Time Calculation (min) (ACUTE ONLY): 36 min  Charges:  $Gait Training: 8-22 mins $Therapeutic Exercise: 8-22 mins                     [place] Pager [phone] Office [phone]    Isidro Monks 12/13/2017, 3:18 PM

## 2017-12-13 NOTE — Progress Notes (Signed)
    Subjective:  Patient reports pain as mild to moderate.  Denies N/V/CP/SOB. No c/o.  Objective:   VITALS:   Vitals:   12/12/17 2213 12/13/17 0151 12/13/17 0545 12/13/17 0941  BP:  (!) 96/56 117/61 (!) 143/71  Pulse: 66 65 77 85  Resp: 16 16 16 16   Temp:  97.9 F (36.6 C) 99.3 F (37.4 C) 98.8 F (37.1 C)  TempSrc:  Oral Axillary Oral  SpO2: 99% 97% 100% 99%  Weight:      Height:        NAD ABD soft Sensation intact distally Intact pulses distally Dorsiflexion/Plantar flexion intact Incision: dressing C/D/I Compartment soft   Lab Results  Component Value Date   WBC 7.8 12/13/2017   HGB 10.4 (L) 12/13/2017   HCT 31.8 (L) 12/13/2017   MCV 92.4 12/13/2017   PLT 168 12/13/2017   BMET    Component Value Date/Time   NA 141 12/13/2017 0345   K 4.5 12/13/2017 0345   CL 108 12/13/2017 0345   CO2 26 12/13/2017 0345   GLUCOSE 161 (H) 12/13/2017 0345   BUN 12 12/13/2017 0345   CREATININE 0.69 12/13/2017 0345   CALCIUM 8.5 (L) 12/13/2017 0345   GFRNONAA >60 12/13/2017 0345   GFRAA >60 12/13/2017 0345     Assessment/Plan: 1 Day Post-Op   Principal Problem:   Osteoarthritis of right knee   WBAT with walker DVT ppx: Aspirin, SCDs, TEDS PO pain control PT/OT Dispo: D/C home tomorrow with outpatient PT  Lindsey Baldwin 12/13/2017, 11:56 AM   Rod Can, MD Cell [phone]

## 2017-12-13 NOTE — Progress Notes (Signed)
Physical Therapy Treatment Patient Details Name: Lindsey Baldwin MRN: [id number] DOB: [birth year] Today's Date: 12/13/2017    History of Present Illness Pt is a 72 YO female s/p R TKR on 9/12. PMH includes L eye blindness, GERD, R eye glaucoma, HTN, OA, OSA on CPAP, OP, DM II.    PT Comments    Pt cooperative but progressing slowly with mobility 2* pain ltd WB on R LE with ambulation.   Follow Up Recommendations  Follow surgeon's recommendation for DC plan and follow-up therapies;Supervision for mobility/OOB     Equipment Recommendations  None recommended by PT    Recommendations for Other Services       Precautions / Restrictions Precautions Precautions: Fall Restrictions Weight Bearing Restrictions: No RLE Weight Bearing: Weight bearing as tolerated    Mobility  Bed Mobility Overal bed mobility: Needs Assistance Bed Mobility: Supine to Sit     Supine to sit: Min assist     General bed mobility comments: Min assist for LE management and trunk elevation. Verbal cuing throughout for sequencing.   Transfers Overall transfer level: Needs assistance Equipment used: Rolling walker (2 wheeled) Transfers: Sit to/from Stand Sit to Stand: Min assist;From elevated surface         General transfer comment: Min assist for power up and steadying upon standing. Verbal cuing for hand placement, WBAT status of RLE.   Ambulation/Gait Ambulation/Gait assistance: Min assist Gait Distance (Feet): 28 Feet Assistive device: Rolling walker (2 wheeled) Gait Pattern/deviations: Step-to pattern;Decreased stride length;Decreased weight shift to right;Decreased stance time - right;Wide base of support;Trunk flexed Gait velocity: cues for sequence, posture and position from RW.  Pt tolerating ltd WB on R LE 2* pain   General Gait Details: Min assist for steadying and RLE guarding to prevent knee buckling intially. RLE guarding decreased when no knee buckling present. Verbal cuing for  sequencing, placement in RW.    Stairs             Wheelchair Mobility    Modified Rankin (Stroke Patients Only)       Balance Overall balance assessment: Mild deficits observed, not formally tested                                          Cognition Arousal/Alertness: Awake/alert Behavior During Therapy: WFL for tasks assessed/performed Overall Cognitive Status: Within Functional Limits for tasks assessed                                        Exercises Total Joint Exercises Ankle Circles/Pumps: AROM;Both;Supine;15 reps Quad Sets: AROM;Right;Supine;10 reps Heel Slides: AAROM;Right;Supine;10 reps Straight Leg Raises: AAROM;Right;Supine;10 reps Goniometric ROM: Pain ltd AAROM R knee -10- 30    General Comments        Pertinent Vitals/Pain Pain Assessment: 0-10 Pain Score: 5  Pain Location: R knee, with mobility  Pain Descriptors / Indicators: Throbbing Pain Intervention(s): Limited activity within patient's tolerance;Monitored during session;Premedicated before session;Ice applied    Home Living                      Prior Function            PT Goals (current goals can now be found in the care plan section) Acute Rehab PT Goals Patient Stated Goal: Regain  IND PT Goal Formulation: With patient Time For Goal Achievement: 12/26/17 Potential to Achieve Goals: Good Progress towards PT goals: Progressing toward goals    Frequency    7X/week      PT Plan Current plan remains appropriate    Co-evaluation              AM-PAC PT "6 Clicks" Daily Activity  Outcome Measure  Difficulty turning over in bed (including adjusting bedclothes, sheets and blankets)?: Unable Difficulty moving from lying on back to sitting on the side of the bed? : Unable Difficulty sitting down on and standing up from a chair with arms (e.g., wheelchair, bedside commode, etc,.)?: Unable Help needed moving to and from a bed to  chair (including a wheelchair)?: A Little Help needed walking in hospital room?: A Little Help needed climbing 3-5 steps with a railing? : A Lot 6 Click Score: 11    End of Session Equipment Utilized During Treatment: Gait belt Activity Tolerance: Patient tolerated treatment well;Patient limited by pain;Patient limited by fatigue Patient left: in chair;with chair alarm set;with call bell/phone within reach Nurse Communication: Mobility status PT Visit Diagnosis: Other abnormalities of gait and mobility (R26.89);Difficulty in walking, not elsewhere classified (R26.2)     Time: 4975-3005 PT Time Calculation (min) (ACUTE ONLY): 33 min  Charges:  $Gait Training: 8-22 mins $Therapeutic Exercise: 8-22 mins                     [place] Pager [phone] Office [phone]    Livvy Spilman 12/13/2017, 10:07 AM

## 2017-12-13 NOTE — Addendum Note (Signed)
Addendum  created 12/13/17 0646 by Victoriano Lain, CRNA   Charge Capture section accepted

## 2017-12-13 NOTE — Discharge Summary (Signed)
Physician Discharge Summary  Patient ID: Lindsey Baldwin MRN: [id number] DOB/AGE: 70-May-1947 70 y.o.  Admit date: 12/12/2017 Discharge date: 12/14/2017  Admission Diagnoses:  Osteoarthritis of right knee  Discharge Diagnoses:  Principal Problem:   Osteoarthritis of right knee   Past Medical History:  Diagnosis Date  . 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 Family history of adverse reaction to anesthesia    MOTHER-- PONV  . GERD (gastroesophageal reflux disease)   . Glaucoma, right eye    CLOSED ANGLE  . Hypertension   . OA (osteoarthritis)    KNEES , HANDS  . OAB (overactive bladder)   . OSA on CPAP   . Osteoporosis   . Type 2 diabetes mellitus ([place])    FOLLOWED BY PCP  . Wears glasses     Surgeries: Procedure(s): RIGHT TOTAL KNEE ARTHROPLASTY WITH COMPUTER NAVIGATION on 12/12/2017   Consultants (if any):   Discharged Condition: Improved  Hospital Course: TYLER ROBIDOUX is an 72 y.o. female who was admitted 12/12/2017 with a diagnosis of Osteoarthritis of right knee and went to the operating room on 12/12/2017 and underwent the above named procedures.    She was given perioperative antibiotics:  Anti-infectives (From admission, onward)   Start     Dose/Rate Route Frequency Ordered Stop   12/12/17 1730  clindamycin (CLEOCIN) IVPB 600 mg     600 mg 100 mL/hr over 30 Minutes Intravenous Every 6 hours 12/12/17 1522 12/13/17 0810   12/12/17 0800  clindamycin (CLEOCIN) IVPB 900 mg     900 mg 100 mL/hr over 30 Minutes Intravenous On call to O.R. 12/12/17 0757 12/12/17 1121    .  She was given sequential compression devices, early ambulation, and ASA for DVT prophylaxis.  She benefited maximally from the hospital stay and there were no complications.    Recent vital signs:  Vitals:   12/13/17 2115 12/14/17 0613  BP: 134/64 (!) 141/83  Pulse: 92 89  Resp: 16   Temp: 98.2 F (36.8 C) 98.1 F (36.7 C)  SpO2: 97% 99%    Recent laboratory studies:  Lab  Results  Component Value Date   HGB 10.1 (L) 12/14/2017   HGB 10.4 (L) 12/13/2017   Lab Results  Component Value Date   WBC 10.3 12/14/2017   PLT 177 12/14/2017   No results found for: INR Lab Results  Component Value Date   NA 141 12/13/2017   K 4.5 12/13/2017   CL 108 12/13/2017   CO2 26 12/13/2017   BUN 12 12/13/2017   CREATININE 0.69 12/13/2017   GLUCOSE 161 (H) 12/13/2017    Discharge Medications:   Allergies as of 12/14/2017      Reactions   Penicillins Swelling, Other (See Comments)   Has patient had a PCN reaction causing immediate rash, facial/tongue/throat swelling, SOB or lightheadedness with hypotension: YES Has patient had a PCN reaction causing severe rash involving mucus membranes or skin necrosis: NO Has patient had a PCN reaction that required hospitalization: NO Has patient had a PCN reaction occurring within the last 10 years: NO If all of the above answers are "NO", then may proceed with Cephalosporin use.   Sulfa Antibiotics Rash      Medication List    STOP taking these medications   aspirin 81 MG tablet Replaced by:  aspirin 81 MG chewable tablet     TAKE these medications   apraclonidine 0.5 % ophthalmic solution Commonly known as:  IOPIDINE Place 1  drop into the right eye 2 (two) times daily.   aspirin 81 MG chewable tablet Chew 1 tablet (81 mg total) by mouth 2 (two) times daily. Replaces:  aspirin 81 MG tablet   atorvastatin 40 MG tablet Commonly known as:  LIPITOR Take 40 mg by mouth every evening.   CALCIUM + D3 PO Take 1 tablet by mouth daily.   calcium carbonate 500 MG chewable tablet Commonly known as:  TUMS - dosed in mg elemental calcium Chew 1 tablet by mouth as needed for indigestion or heartburn.   docusate sodium 100 MG capsule Commonly known as:  COLACE Take 1 capsule (100 mg total) by mouth 2 (two) times daily.   enalapril 2.5 MG tablet Commonly known as:  VASOTEC Take 2.5 mg by mouth every morning.    furosemide 20 MG tablet Commonly known as:  LASIX Take 20 mg by mouth every morning.   HYDROcodone-acetaminophen 5-325 MG tablet Commonly known as:  NORCO/VICODIN Take 1-2 tablets by mouth every 6 (six) hours as needed for moderate pain (pain score 4-6).   latanoprost 0.005 % ophthalmic solution Commonly known as:  XALATAN Place 1 drop into the right eye at bedtime.   metFORMIN 500 MG tablet Commonly known as:  GLUCOPHAGE Take 1,000 mg by mouth 2 (two) times daily.   multivitamin with minerals tablet Take 1 tablet by mouth daily.   MYRBETRIQ 50 MG Tb24 tablet Generic drug:  mirabegron ER Take 50 mg by mouth every morning.   ondansetron 4 MG tablet Commonly known as:  ZOFRAN Take 1 tablet (4 mg total) by mouth every 6 (six) hours as needed for nausea.   senna 8.6 MG Tabs tablet Commonly known as:  SENOKOT Take 1 tablet (8.6 mg total) by mouth 2 (two) times daily.   sitaGLIPtin 100 MG tablet Commonly known as:  JANUVIA Take 100 mg by mouth every morning.   timolol 0.5 % ophthalmic solution Commonly known as:  BETIMOL Place 1 drop into the right eye 2 (two) times daily.       Diagnostic Studies: Dg Knee Right Port  Result Date: 12/12/2017 CLINICAL DATA:  Status post right knee replacement EXAM: PORTABLE RIGHT KNEE - 1-2 VIEW COMPARISON:  None. FINDINGS: Right knee replacement is noted in satisfactory position. Air is noted in the surgical bed. No acute bony or soft tissue abnormality is seen. IMPRESSION: Status post right knee replacement Electronically Signed   By: Inez Catalina M.D.   On: 12/12/2017 14:16    Disposition:     Follow-up Information    Mica Releford, Aaron Edelman, MD. Schedule an appointment as soon as possible for a visit in 2 weeks.   Specialty:  Orthopedic Surgery Why:  For wound re-check Contact information: [address] [place] [address] [phone]            Signed: Hilton Cork Kaden Daughdrill 12/16/2017, 12:49 PM

## 2017-12-13 NOTE — Care Management (Signed)
DC plan: pt states she is having outpatient PT onsite at her independent living. RW and 3in1 to be delivered by [employer] rep. Marney Doctor RN,BSN [phone]

## 2017-12-14 LAB — CBC
HCT: 30.3 % — ABNORMAL LOW (ref 36.0–46.0)
Hemoglobin: 10.1 g/dL — ABNORMAL LOW (ref 12.0–15.0)
MCH: 30.3 pg (ref 26.0–34.0)
MCHC: 33.3 g/dL (ref 30.0–36.0)
MCV: 91 fL (ref 78.0–100.0)
Platelets: 177 10*3/uL (ref 150–400)
RBC: 3.33 MIL/uL — ABNORMAL LOW (ref 3.87–5.11)
RDW: 13.6 % (ref 11.5–15.5)
WBC: 10.3 10*3/uL (ref 4.0–10.5)

## 2017-12-14 LAB — GLUCOSE, CAPILLARY
Glucose-Capillary: 164 mg/dL — ABNORMAL HIGH (ref 70–99)
Glucose-Capillary: 123 mg/dL — ABNORMAL HIGH (ref 70–99)

## 2017-12-14 NOTE — Progress Notes (Signed)
Lindsey Baldwin  MRN: [id number] DOB/Age: 70-May-1947 70 y.o. [facility] Procedure: Procedure(s) (LRB): RIGHT TOTAL KNEE ARTHROPLASTY WITH COMPUTER NAVIGATION (Right)     Subjective: Doing well this am, feels ready for DC after PT  Vital Signs Temp:  [98.1 F (36.7 C)-98.8 F (37.1 C)] 98.1 F (36.7 C) (09/14 0613) Pulse Rate:  [85-92] 89 (09/14 0613) Resp:  [16] 16 (09/13 2115) BP: (134-159)/(64-83) 141/83 (09/14 0613) SpO2:  [96 %-99 %] 99 % (09/14 0613)  Lab Results Recent Labs    12/13/17 0345 12/14/17 0348  WBC 7.8 10.3  HGB 10.4* 10.1*  HCT 31.8* 30.3*  PLT 168 177   BMET Recent Labs    12/13/17 0345  NA 141  K 4.5  CL 108  CO2 26  GLUCOSE 161*  BUN 12  CREATININE 0.69  CALCIUM 8.5*   No results found for: INR   Exam Ace wrap removed aquacel dry and intact Min swelling [place] home after PT  [facility] PA-C  12/14/2017, 8:44 AM Contact # [phone]

## 2017-12-14 NOTE — Progress Notes (Signed)
Physical Therapy Treatment Patient Details Name: Lindsey Baldwin MRN: [id number] DOB: 1945-04-29 Today's Date: 12/14/2017    History of Present Illness Pt is a 72 YO female s/p R TKR on 9/12. PMH includes L eye blindness, GERD, R eye glaucoma, HTN, OA, OSA on CPAP, OP, DM II.    PT Comments    Pt progressing steadily with mobility but continues to require intermittent cueing for technique.  Pt eager for dc home.  Follow Up Recommendations  Follow surgeon's recommendation for DC plan and follow-up therapies;Supervision for mobility/OOB     Equipment Recommendations  None recommended by PT    Recommendations for Other Services       Precautions / Restrictions Precautions Precautions: Fall Restrictions Weight Bearing Restrictions: No RLE Weight Bearing: Weight bearing as tolerated    Mobility  Bed Mobility               General bed mobility comments: Pt up in chair and requests back to same  Transfers Overall transfer level: Needs assistance Equipment used: Rolling walker (2 wheeled) Transfers: Sit to/from Stand Sit to Stand: Supervision         General transfer comment: cues for for LE placement and use of UEs to self assist  Ambulation/Gait Ambulation/Gait assistance: Min guard;Supervision Gait Distance (Feet): 100 Feet Assistive device: Rolling walker (2 wheeled) Gait Pattern/deviations: Step-to pattern;Decreased stride length;Decreased weight shift to right;Decreased stance time - right;Wide base of support;Trunk flexed Gait velocity: cues for sequence, posture, stride length and position from RW.     General Gait Details: cues for sequence, posture and position from RW   Stairs             Wheelchair Mobility    Modified Rankin (Stroke Patients Only)       Balance Overall balance assessment: Mild deficits observed, not formally tested                                          Cognition Arousal/Alertness:  Awake/alert Behavior During Therapy: WFL for tasks assessed/performed Overall Cognitive Status: Within Functional Limits for tasks assessed                                        Exercises Total Joint Exercises Ankle Circles/Pumps: AROM;Both;Supine;15 reps Quad Sets: AROM;Right;Supine;10 reps Heel Slides: AAROM;Right;Supine;20 reps Straight Leg Raises: AAROM;Right;Supine;20 reps Long Arc Quad: AAROM;AROM;Right;10 reps;Seated Knee Flexion: AROM;AAROM;Right;15 reps;Seated    General Comments        Pertinent Vitals/Pain Pain Assessment: 0-10 Pain Score: 5  Pain Location: R knee Pain Descriptors / Indicators: Aching;Sore Pain Intervention(s): Limited activity within patient's tolerance;Monitored during session;Premedicated before session;Ice applied    Home Living                      Prior Function            PT Goals (current goals can now be found in the care plan section) Acute Rehab PT Goals Patient Stated Goal: Regain IND PT Goal Formulation: With patient Time For Goal Achievement: 12/26/17 Potential to Achieve Goals: Good Progress towards PT goals: Progressing toward goals    Frequency    7X/week      PT Plan Current plan remains appropriate    Co-evaluation  AM-PAC PT "6 Clicks" Daily Activity  Outcome Measure  Difficulty turning over in bed (including adjusting bedclothes, sheets and blankets)?: A Lot Difficulty moving from lying on back to sitting on the side of the bed? : A Lot Difficulty sitting down on and standing up from a chair with arms (e.g., wheelchair, bedside commode, etc,.)?: A Lot Help needed moving to and from a bed to chair (including a wheelchair)?: A Little Help needed walking in hospital room?: A Little Help needed climbing 3-5 steps with a railing? : A Little 6 Click Score: 15    End of Session Equipment Utilized During Treatment: Gait belt Activity Tolerance: Patient tolerated  treatment well Patient left: in chair;with call bell/phone within reach Nurse Communication: Mobility status PT Visit Diagnosis: Other abnormalities of gait and mobility (R26.89);Difficulty in walking, not elsewhere classified (R26.2)     Time: 9150-5697 PT Time Calculation (min) (ACUTE ONLY): 42 min  Charges:  $Gait Training: 8-22 mins $Therapeutic Exercise: 23-37 mins                     [place] Pager [phone] Office [phone]    Danique Hartsough 12/14/2017, 12:36 PM

## 2017-12-14 NOTE — Progress Notes (Signed)
RN reviewed discharge instructions with patient and family. All questions answered.   Paperwork and prescriptions given.   NT rolled patient down with all belongings to family car. 

## 2017-12-16 DIAGNOSIS — Z96651 Presence of right artificial knee joint: Secondary | ICD-10-CM | POA: Diagnosis not present

## 2017-12-16 DIAGNOSIS — M6281 Muscle weakness (generalized): Secondary | ICD-10-CM | POA: Diagnosis not present

## 2017-12-16 DIAGNOSIS — R2681 Unsteadiness on feet: Secondary | ICD-10-CM | POA: Diagnosis not present

## 2017-12-16 DIAGNOSIS — M25561 Pain in right knee: Secondary | ICD-10-CM | POA: Diagnosis not present

## 2017-12-17 DIAGNOSIS — Z96651 Presence of right artificial knee joint: Secondary | ICD-10-CM | POA: Diagnosis not present

## 2017-12-17 DIAGNOSIS — M25561 Pain in right knee: Secondary | ICD-10-CM | POA: Diagnosis not present

## 2017-12-17 DIAGNOSIS — M6281 Muscle weakness (generalized): Secondary | ICD-10-CM | POA: Diagnosis not present

## 2017-12-17 DIAGNOSIS — R2681 Unsteadiness on feet: Secondary | ICD-10-CM | POA: Diagnosis not present

## 2017-12-18 DIAGNOSIS — R2681 Unsteadiness on feet: Secondary | ICD-10-CM | POA: Diagnosis not present

## 2017-12-18 DIAGNOSIS — M25561 Pain in right knee: Secondary | ICD-10-CM | POA: Diagnosis not present

## 2017-12-18 DIAGNOSIS — Z96651 Presence of right artificial knee joint: Secondary | ICD-10-CM | POA: Diagnosis not present

## 2017-12-18 DIAGNOSIS — M6281 Muscle weakness (generalized): Secondary | ICD-10-CM | POA: Diagnosis not present

## 2017-12-19 DIAGNOSIS — M6281 Muscle weakness (generalized): Secondary | ICD-10-CM | POA: Diagnosis not present

## 2017-12-19 DIAGNOSIS — Z96651 Presence of right artificial knee joint: Secondary | ICD-10-CM | POA: Diagnosis not present

## 2017-12-19 DIAGNOSIS — M25561 Pain in right knee: Secondary | ICD-10-CM | POA: Diagnosis not present

## 2017-12-19 DIAGNOSIS — R2681 Unsteadiness on feet: Secondary | ICD-10-CM | POA: Diagnosis not present

## 2017-12-20 DIAGNOSIS — M6281 Muscle weakness (generalized): Secondary | ICD-10-CM | POA: Diagnosis not present

## 2017-12-20 DIAGNOSIS — Z96651 Presence of right artificial knee joint: Secondary | ICD-10-CM | POA: Diagnosis not present

## 2017-12-20 DIAGNOSIS — R2681 Unsteadiness on feet: Secondary | ICD-10-CM | POA: Diagnosis not present

## 2017-12-20 DIAGNOSIS — M25561 Pain in right knee: Secondary | ICD-10-CM | POA: Diagnosis not present

## 2017-12-23 DIAGNOSIS — R2681 Unsteadiness on feet: Secondary | ICD-10-CM | POA: Diagnosis not present

## 2017-12-23 DIAGNOSIS — Z96651 Presence of right artificial knee joint: Secondary | ICD-10-CM | POA: Diagnosis not present

## 2017-12-23 DIAGNOSIS — M6281 Muscle weakness (generalized): Secondary | ICD-10-CM | POA: Diagnosis not present

## 2017-12-23 DIAGNOSIS — M25561 Pain in right knee: Secondary | ICD-10-CM | POA: Diagnosis not present

## 2017-12-24 DIAGNOSIS — Z471 Aftercare following joint replacement surgery: Secondary | ICD-10-CM | POA: Diagnosis not present

## 2017-12-24 DIAGNOSIS — Z96651 Presence of right artificial knee joint: Secondary | ICD-10-CM | POA: Diagnosis not present

## 2017-12-24 DIAGNOSIS — M6281 Muscle weakness (generalized): Secondary | ICD-10-CM | POA: Diagnosis not present

## 2017-12-24 DIAGNOSIS — M1711 Unilateral primary osteoarthritis, right knee: Secondary | ICD-10-CM | POA: Diagnosis not present

## 2017-12-24 DIAGNOSIS — M25561 Pain in right knee: Secondary | ICD-10-CM | POA: Diagnosis not present

## 2017-12-24 DIAGNOSIS — R2681 Unsteadiness on feet: Secondary | ICD-10-CM | POA: Diagnosis not present

## 2017-12-25 DIAGNOSIS — M25561 Pain in right knee: Secondary | ICD-10-CM | POA: Diagnosis not present

## 2017-12-25 DIAGNOSIS — Z96651 Presence of right artificial knee joint: Secondary | ICD-10-CM | POA: Diagnosis not present

## 2017-12-25 DIAGNOSIS — M6281 Muscle weakness (generalized): Secondary | ICD-10-CM | POA: Diagnosis not present

## 2017-12-25 DIAGNOSIS — R2681 Unsteadiness on feet: Secondary | ICD-10-CM | POA: Diagnosis not present

## 2017-12-26 DIAGNOSIS — R2681 Unsteadiness on feet: Secondary | ICD-10-CM | POA: Diagnosis not present

## 2017-12-26 DIAGNOSIS — Z96651 Presence of right artificial knee joint: Secondary | ICD-10-CM | POA: Diagnosis not present

## 2017-12-26 DIAGNOSIS — M6281 Muscle weakness (generalized): Secondary | ICD-10-CM | POA: Diagnosis not present

## 2017-12-26 DIAGNOSIS — M25561 Pain in right knee: Secondary | ICD-10-CM | POA: Diagnosis not present

## 2017-12-27 DIAGNOSIS — Z96651 Presence of right artificial knee joint: Secondary | ICD-10-CM | POA: Diagnosis not present

## 2017-12-27 DIAGNOSIS — R2681 Unsteadiness on feet: Secondary | ICD-10-CM | POA: Diagnosis not present

## 2017-12-27 DIAGNOSIS — M6281 Muscle weakness (generalized): Secondary | ICD-10-CM | POA: Diagnosis not present

## 2017-12-27 DIAGNOSIS — M25561 Pain in right knee: Secondary | ICD-10-CM | POA: Diagnosis not present

## 2017-12-30 DIAGNOSIS — M25561 Pain in right knee: Secondary | ICD-10-CM | POA: Diagnosis not present

## 2017-12-30 DIAGNOSIS — Z96651 Presence of right artificial knee joint: Secondary | ICD-10-CM | POA: Diagnosis not present

## 2017-12-30 DIAGNOSIS — R2681 Unsteadiness on feet: Secondary | ICD-10-CM | POA: Diagnosis not present

## 2017-12-30 DIAGNOSIS — M6281 Muscle weakness (generalized): Secondary | ICD-10-CM | POA: Diagnosis not present

## 2017-12-31 DIAGNOSIS — R2681 Unsteadiness on feet: Secondary | ICD-10-CM | POA: Diagnosis not present

## 2017-12-31 DIAGNOSIS — R3915 Urgency of urination: Secondary | ICD-10-CM | POA: Diagnosis not present

## 2017-12-31 DIAGNOSIS — M25561 Pain in right knee: Secondary | ICD-10-CM | POA: Diagnosis not present

## 2017-12-31 DIAGNOSIS — R351 Nocturia: Secondary | ICD-10-CM | POA: Diagnosis not present

## 2017-12-31 DIAGNOSIS — M6281 Muscle weakness (generalized): Secondary | ICD-10-CM | POA: Diagnosis not present

## 2017-12-31 DIAGNOSIS — Z96651 Presence of right artificial knee joint: Secondary | ICD-10-CM | POA: Diagnosis not present

## 2018-01-01 DIAGNOSIS — M6281 Muscle weakness (generalized): Secondary | ICD-10-CM | POA: Diagnosis not present

## 2018-01-01 DIAGNOSIS — Z96651 Presence of right artificial knee joint: Secondary | ICD-10-CM | POA: Diagnosis not present

## 2018-01-01 DIAGNOSIS — R2681 Unsteadiness on feet: Secondary | ICD-10-CM | POA: Diagnosis not present

## 2018-01-01 DIAGNOSIS — M25561 Pain in right knee: Secondary | ICD-10-CM | POA: Diagnosis not present

## 2018-01-03 DIAGNOSIS — R2681 Unsteadiness on feet: Secondary | ICD-10-CM | POA: Diagnosis not present

## 2018-01-03 DIAGNOSIS — Z96651 Presence of right artificial knee joint: Secondary | ICD-10-CM | POA: Diagnosis not present

## 2018-01-03 DIAGNOSIS — M25561 Pain in right knee: Secondary | ICD-10-CM | POA: Diagnosis not present

## 2018-01-03 DIAGNOSIS — M6281 Muscle weakness (generalized): Secondary | ICD-10-CM | POA: Diagnosis not present

## 2018-01-06 DIAGNOSIS — M6281 Muscle weakness (generalized): Secondary | ICD-10-CM | POA: Diagnosis not present

## 2018-01-06 DIAGNOSIS — R2681 Unsteadiness on feet: Secondary | ICD-10-CM | POA: Diagnosis not present

## 2018-01-06 DIAGNOSIS — M25561 Pain in right knee: Secondary | ICD-10-CM | POA: Diagnosis not present

## 2018-01-06 DIAGNOSIS — Z96651 Presence of right artificial knee joint: Secondary | ICD-10-CM | POA: Diagnosis not present

## 2018-01-07 DIAGNOSIS — M25561 Pain in right knee: Secondary | ICD-10-CM | POA: Diagnosis not present

## 2018-01-07 DIAGNOSIS — M6281 Muscle weakness (generalized): Secondary | ICD-10-CM | POA: Diagnosis not present

## 2018-01-07 DIAGNOSIS — Z96651 Presence of right artificial knee joint: Secondary | ICD-10-CM | POA: Diagnosis not present

## 2018-01-07 DIAGNOSIS — R2681 Unsteadiness on feet: Secondary | ICD-10-CM | POA: Diagnosis not present

## 2018-01-10 DIAGNOSIS — M6281 Muscle weakness (generalized): Secondary | ICD-10-CM | POA: Diagnosis not present

## 2018-01-10 DIAGNOSIS — R2681 Unsteadiness on feet: Secondary | ICD-10-CM | POA: Diagnosis not present

## 2018-01-10 DIAGNOSIS — Z96651 Presence of right artificial knee joint: Secondary | ICD-10-CM | POA: Diagnosis not present

## 2018-01-10 DIAGNOSIS — M25561 Pain in right knee: Secondary | ICD-10-CM | POA: Diagnosis not present

## 2018-01-13 DIAGNOSIS — Z96651 Presence of right artificial knee joint: Secondary | ICD-10-CM | POA: Diagnosis not present

## 2018-01-13 DIAGNOSIS — M25561 Pain in right knee: Secondary | ICD-10-CM | POA: Diagnosis not present

## 2018-01-13 DIAGNOSIS — R2681 Unsteadiness on feet: Secondary | ICD-10-CM | POA: Diagnosis not present

## 2018-01-13 DIAGNOSIS — M6281 Muscle weakness (generalized): Secondary | ICD-10-CM | POA: Diagnosis not present

## 2018-01-14 DIAGNOSIS — M6281 Muscle weakness (generalized): Secondary | ICD-10-CM | POA: Diagnosis not present

## 2018-01-14 DIAGNOSIS — Z96651 Presence of right artificial knee joint: Secondary | ICD-10-CM | POA: Diagnosis not present

## 2018-01-14 DIAGNOSIS — R2681 Unsteadiness on feet: Secondary | ICD-10-CM | POA: Diagnosis not present

## 2018-01-14 DIAGNOSIS — M25561 Pain in right knee: Secondary | ICD-10-CM | POA: Diagnosis not present

## 2018-01-17 DIAGNOSIS — R2681 Unsteadiness on feet: Secondary | ICD-10-CM | POA: Diagnosis not present

## 2018-01-17 DIAGNOSIS — Z96651 Presence of right artificial knee joint: Secondary | ICD-10-CM | POA: Diagnosis not present

## 2018-01-17 DIAGNOSIS — M6281 Muscle weakness (generalized): Secondary | ICD-10-CM | POA: Diagnosis not present

## 2018-01-17 DIAGNOSIS — M25561 Pain in right knee: Secondary | ICD-10-CM | POA: Diagnosis not present

## 2018-01-20 DIAGNOSIS — M6281 Muscle weakness (generalized): Secondary | ICD-10-CM | POA: Diagnosis not present

## 2018-01-20 DIAGNOSIS — M25561 Pain in right knee: Secondary | ICD-10-CM | POA: Diagnosis not present

## 2018-01-20 DIAGNOSIS — Z96651 Presence of right artificial knee joint: Secondary | ICD-10-CM | POA: Diagnosis not present

## 2018-01-20 DIAGNOSIS — R2681 Unsteadiness on feet: Secondary | ICD-10-CM | POA: Diagnosis not present

## 2018-01-21 DIAGNOSIS — Z96651 Presence of right artificial knee joint: Secondary | ICD-10-CM | POA: Diagnosis not present

## 2018-01-21 DIAGNOSIS — Z471 Aftercare following joint replacement surgery: Secondary | ICD-10-CM | POA: Diagnosis not present

## 2018-02-04 DIAGNOSIS — E1140 Type 2 diabetes mellitus with diabetic neuropathy, unspecified: Secondary | ICD-10-CM | POA: Diagnosis not present

## 2018-02-04 DIAGNOSIS — E7800 Pure hypercholesterolemia, unspecified: Secondary | ICD-10-CM | POA: Diagnosis not present

## 2018-02-04 DIAGNOSIS — Z0000 Encounter for general adult medical examination without abnormal findings: Secondary | ICD-10-CM | POA: Diagnosis not present

## 2018-02-04 DIAGNOSIS — M810 Age-related osteoporosis without current pathological fracture: Secondary | ICD-10-CM | POA: Diagnosis not present

## 2018-02-14 DIAGNOSIS — Z4420 Encounter for fitting and adjustment of artificial eye, unspecified: Secondary | ICD-10-CM | POA: Diagnosis not present

## 2018-03-10 DIAGNOSIS — H401113 Primary open-angle glaucoma, right eye, severe stage: Secondary | ICD-10-CM | POA: Diagnosis not present

## 2018-03-10 DIAGNOSIS — H548 Legal blindness, as defined in USA: Secondary | ICD-10-CM | POA: Diagnosis not present

## 2018-03-10 DIAGNOSIS — Z970 Presence of artificial eye: Secondary | ICD-10-CM | POA: Diagnosis not present

## 2018-04-24 DIAGNOSIS — G4733 Obstructive sleep apnea (adult) (pediatric): Secondary | ICD-10-CM | POA: Diagnosis not present

## 2018-04-30 DIAGNOSIS — Z86010 Personal history of colonic polyps: Secondary | ICD-10-CM | POA: Diagnosis not present

## 2018-04-30 DIAGNOSIS — K640 First degree hemorrhoids: Secondary | ICD-10-CM | POA: Diagnosis not present

## 2018-04-30 DIAGNOSIS — D122 Benign neoplasm of ascending colon: Secondary | ICD-10-CM | POA: Diagnosis not present

## 2018-04-30 DIAGNOSIS — D125 Benign neoplasm of sigmoid colon: Secondary | ICD-10-CM | POA: Diagnosis not present

## 2018-04-30 DIAGNOSIS — K635 Polyp of colon: Secondary | ICD-10-CM | POA: Diagnosis not present

## 2018-05-02 DIAGNOSIS — D122 Benign neoplasm of ascending colon: Secondary | ICD-10-CM | POA: Diagnosis not present

## 2018-05-02 DIAGNOSIS — D125 Benign neoplasm of sigmoid colon: Secondary | ICD-10-CM | POA: Diagnosis not present

## 2018-05-10 ENCOUNTER — Encounter (HOSPITAL_BASED_OUTPATIENT_CLINIC_OR_DEPARTMENT_OTHER): Payer: Self-pay | Admitting: Emergency Medicine

## 2018-05-10 ENCOUNTER — Emergency Department (HOSPITAL_BASED_OUTPATIENT_CLINIC_OR_DEPARTMENT_OTHER): Payer: Medicare Other

## 2018-05-10 ENCOUNTER — Emergency Department (HOSPITAL_BASED_OUTPATIENT_CLINIC_OR_DEPARTMENT_OTHER)
Admission: EM | Admit: 2018-05-10 | Discharge: 2018-05-10 | Disposition: A | Discharge status: Home / Self Care | Payer: Medicare Other | Attending: Emergency Medicine | Admitting: Emergency Medicine

## 2018-05-10 ENCOUNTER — Other Ambulatory Visit: Payer: Self-pay

## 2018-05-10 DIAGNOSIS — R1032 Left lower quadrant pain: Secondary | ICD-10-CM | POA: Diagnosis present

## 2018-05-10 DIAGNOSIS — E119 Type 2 diabetes mellitus without complications: Secondary | ICD-10-CM | POA: Diagnosis not present

## 2018-05-10 DIAGNOSIS — Z7984 Long term (current) use of oral hypoglycemic drugs: Secondary | ICD-10-CM | POA: Diagnosis not present

## 2018-05-10 DIAGNOSIS — N201 Calculus of ureter: Secondary | ICD-10-CM

## 2018-05-10 DIAGNOSIS — I10 Essential (primary) hypertension: Secondary | ICD-10-CM | POA: Insufficient documentation

## 2018-05-10 DIAGNOSIS — Z7982 Long term (current) use of aspirin: Secondary | ICD-10-CM | POA: Insufficient documentation

## 2018-05-10 DIAGNOSIS — Z79899 Other long term (current) drug therapy: Secondary | ICD-10-CM | POA: Insufficient documentation

## 2018-05-10 DIAGNOSIS — Z96651 Presence of right artificial knee joint: Secondary | ICD-10-CM | POA: Diagnosis not present

## 2018-05-10 DIAGNOSIS — N132 Hydronephrosis with renal and ureteral calculous obstruction: Secondary | ICD-10-CM | POA: Diagnosis not present

## 2018-05-10 DIAGNOSIS — N1330 Unspecified hydronephrosis: Secondary | ICD-10-CM | POA: Diagnosis not present

## 2018-05-10 DIAGNOSIS — R112 Nausea with vomiting, unspecified: Secondary | ICD-10-CM | POA: Diagnosis not present

## 2018-05-10 LAB — COMPREHENSIVE METABOLIC PANEL
ALT: 39 U/L (ref 0–44)
AST: 36 U/L (ref 15–41)
Albumin: 4.4 g/dL (ref 3.5–5.0)
Alkaline Phosphatase: 70 U/L (ref 38–126)
Anion gap: 11 (ref 5–15)
BILIRUBIN TOTAL: 0.7 mg/dL (ref 0.3–1.2)
BUN: 19 mg/dL (ref 8–23)
CALCIUM: 9.6 mg/dL (ref 8.9–10.3)
CO2: 19 mmol/L — ABNORMAL LOW (ref 22–32)
Chloride: 106 mmol/L (ref 98–111)
Creatinine, Ser: 0.9 mg/dL (ref 0.44–1.00)
GFR calc Af Amer: >60 mL/min (ref >60)
Glucose, Bld: 247 mg/dL — ABNORMAL HIGH (ref 70–99)
POTASSIUM: 4.5 mmol/L (ref 3.5–5.1)
Sodium: 136 mmol/L (ref 135–145)
TOTAL PROTEIN: 7.5 g/dL (ref 6.5–8.1)

## 2018-05-10 LAB — URINALYSIS, MICROSCOPIC (REFLEX)

## 2018-05-10 LAB — CBC
HCT: 40.8 % (ref 36.0–46.0)
Hemoglobin: 12.9 g/dL (ref 12.0–15.0)
MCH: 28.6 pg (ref 26.0–34.0)
MCHC: 31.6 g/dL (ref 30.0–36.0)
MCV: 90.5 fL (ref 80.0–100.0)
NRBC: 0 % (ref 0.0–0.2)
PLATELETS: 230 10*3/uL (ref 150–400)
RBC: 4.51 MIL/uL (ref 3.87–5.11)
RDW: 14.3 % (ref 11.5–15.5)
WBC: 12.7 10*3/uL — ABNORMAL HIGH (ref 4.0–10.5)

## 2018-05-10 LAB — URINALYSIS, ROUTINE W REFLEX MICROSCOPIC
Bilirubin Urine: NEGATIVE
GLUCOSE, UA: 250 mg/dL — AB
KETONES UR: NEGATIVE mg/dL
Leukocytes, UA: NEGATIVE
Nitrite: NEGATIVE
PROTEIN: NEGATIVE mg/dL
Specific Gravity, Urine: 1.02 (ref 1.005–1.030)
pH: 6 (ref 5.0–8.0)

## 2018-05-10 LAB — LIPASE, BLOOD: Lipase: 30 U/L (ref 11–51)

## 2018-05-10 MED ORDER — ONDANSETRON 4 MG PO TBDP: 4.0000 mg | ORAL_TABLET | Freq: Three times a day (TID) | ORAL | 0 refills | Status: DC | PRN

## 2018-05-10 MED ORDER — MORPHINE SULFATE (PF) 4 MG/ML IV SOLN
4.0000 mg | Freq: Once | INTRAVENOUS | Status: AC
  Administered 2018-05-10: 4 mg via INTRAVENOUS
  Filled 2018-05-10: qty 1

## 2018-05-10 MED ORDER — HYDROCODONE-ACETAMINOPHEN 5-325 MG PO TABS
1.0000 | ORAL_TABLET | Freq: Once | ORAL | Status: AC
  Administered 2018-05-10: 1 via ORAL
  Filled 2018-05-10: qty 1

## 2018-05-10 MED ORDER — HYDROCODONE-ACETAMINOPHEN 5-325 MG PO TABS: 1.0000 | ORAL_TABLET | Freq: Four times a day (QID) | ORAL | 0 refills | Status: DC | PRN

## 2018-05-10 MED ORDER — ONDANSETRON HCL 4 MG/2ML IJ SOLN
4.0000 mg | Freq: Once | INTRAMUSCULAR | Status: AC | PRN
  Administered 2018-05-10: 4 mg via INTRAVENOUS
  Filled 2018-05-10: qty 2

## 2018-05-10 NOTE — ED Provider Notes (Signed)
[facility] EMERGENCY DEPARTMENT Provider Note   CSN: [id number] Arrival date & time: 05/10/18  0756     History   Chief Complaint Chief Complaint  Patient presents with  . Flank Pain    HPI Lindsey Baldwin is a 73 y.o. female.  HPI  73 year old female presents with left flank pain.  Started all of a sudden around 31 PM.  It has waxed and waned since.  She tried Tylenol without relief.  She has vomited about 3 times.  Was given Zofran by nurse just prior to me seeing her.  The pain is mostly in the left flank/back.  No urinary symptoms, diarrhea.  No fevers.  She states she had a kidney stone in 1990 that she thinks feels similar but has not had any other stones since. Pain is about 8/10.  Past Medical History:  Diagnosis Date  . 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 Family history of adverse reaction to anesthesia    MOTHER-- PONV  . GERD (gastroesophageal reflux disease)   . Glaucoma, right eye    CLOSED ANGLE  . Hypertension   . OA (osteoarthritis)    KNEES , HANDS  . OAB (overactive bladder)   . OSA on CPAP   . Osteoporosis   . Type 2 diabetes mellitus ([place])    FOLLOWED BY PCP  . Wears glasses     Patient Active Problem List   Diagnosis Date Noted  . Osteoarthritis of right knee 12/12/2017    Past Surgical History:  Procedure Laterality Date  . CYST EXCISION  08/2017   POSTERIOR NECK  . EYE SURGERY Left 1992   REMOVE EYE AND PLACEMENT PROSTHESIS  . KNEE ARTHROPLASTY Right 12/12/2017   Procedure: RIGHT TOTAL KNEE ARTHROPLASTY WITH COMPUTER NAVIGATION;  Surgeon: Rod Can, MD;  Location: [facility] ORS;  Service: Orthopedics;  Laterality: Right;  Needs RNFA  . TONSILLECTOMY  AGE 36     OB History   No obstetric history on file.      Home Medications    Prior to Admission medications   Medication Sig Start Date End Date Taking? Authorizing Provider  apraclonidine (IOPIDINE) 0.5 % ophthalmic solution Place 1 drop into the right eye 2 (two)  times daily. 11/21/17  Yes [provider]  aspirin 81 MG chewable tablet Chew 1 tablet (81 mg total) by mouth 2 (two) times daily. 12/13/17  Yes Swinteck, Aaron Edelman, MD  atorvastatin (LIPITOR) 40 MG tablet Take 40 mg by mouth every evening.    Yes [provider]  Calcium Carb-Cholecalciferol (CALCIUM + D3 PO) Take 1 tablet by mouth daily.   Yes [provider]  enalapril (VASOTEC) 2.5 MG tablet Take 2.5 mg by mouth every morning.    Yes [provider]  furosemide (LASIX) 20 MG tablet Take 20 mg by mouth every morning.    Yes [provider]  latanoprost (XALATAN) 0.005 % ophthalmic solution Place 1 drop into the right eye at bedtime.   Yes [provider]  metFORMIN (GLUCOPHAGE) 500 MG tablet Take 1,000 mg by mouth 2 (two) times daily.    Yes [provider]  mirabegron ER (MYRBETRIQ) 50 MG TB24 tablet Take 50 mg by mouth every morning.    Yes [provider]  Multiple Vitamins-Minerals (MULTIVITAMIN WITH MINERALS) tablet Take 1 tablet by mouth daily.   Yes [provider]  ondansetron (ZOFRAN) 4 MG tablet Take 1 tablet (4 mg total) by mouth every 6 (  six) hours as needed for nausea. 12/13/17  Yes Swinteck, Aaron Edelman, MD  senna (SENOKOT) 8.6 MG TABS tablet Take 1 tablet (8.6 mg total) by mouth 2 (two) times daily. 12/13/17  Yes Swinteck, Aaron Edelman, MD  sitaGLIPtin (JANUVIA) 100 MG tablet Take 100 mg by mouth every morning.   Yes [provider]  timolol (BETIMOL) 0.5 % ophthalmic solution Place 1 drop into the right eye 2 (two) times daily.   Yes [provider]  calcium carbonate (TUMS - DOSED IN MG ELEMENTAL CALCIUM) 500 MG chewable tablet Chew 1 tablet by mouth as needed for indigestion or heartburn.    [provider]  docusate sodium (COLACE) 100 MG capsule Take 1 capsule (100 mg total) by mouth 2 (two) times daily. 12/13/17   Swinteck, Aaron Edelman, MD  HYDROcodone-acetaminophen (NORCO) 5-325 MG tablet Take 1  tablet by mouth every 6 (six) hours as needed for severe pain. 05/10/18   Sherwood Gambler, MD  ondansetron (ZOFRAN ODT) 4 MG disintegrating tablet Take 1 tablet (4 mg total) by mouth every 8 (eight) hours as needed for nausea or vomiting. 05/10/18   Sherwood Gambler, MD    Family History Family History  Problem Relation Age of Onset  . Cancer Mother   . Cancer Father     Social History Social History   Tobacco Use  . Smoking status: Never Smoker  . Smokeless tobacco: Never Used  Substance Use Topics  . Alcohol use: No  . Drug use: Never     Allergies   Penicillins and Sulfa antibiotics   Review of Systems Review of Systems  Constitutional: Negative for fever.  Gastrointestinal: Positive for nausea and vomiting. Negative for abdominal pain and diarrhea.  Genitourinary: Positive for flank pain. Negative for dysuria and hematuria.  Musculoskeletal: Positive for back pain.  All other systems reviewed and are negative.    Physical Exam Updated Vital Signs BP (!) 177/75 (BP Location: Left Arm)   Pulse (!) 58   Temp 98.2 F (36.8 C) (Oral)   Resp 20   Ht 5\' 2"  (1.575 m)   Wt 77.1 kg   SpO2 100%   BMI 31.09 kg/m   Physical Exam Vitals signs and nursing note reviewed.  Constitutional:      General: She is not in acute distress.    Appearance: She is well-developed. She is not ill-appearing or diaphoretic.  HENT:     Head: Normocephalic and atraumatic.     Right Ear: External ear normal.     Left Ear: External ear normal.     Nose: Nose normal.  Eyes:     General:        Right eye: No discharge.        Left eye: No discharge.  Cardiovascular:     Rate and Rhythm: Normal rate and regular rhythm.     Heart sounds: Normal heart sounds.  Pulmonary:     Effort: Pulmonary effort is normal.     Breath sounds: Normal breath sounds.  Abdominal:     Palpations: Abdomen is soft.     Tenderness: There is abdominal tenderness in the left upper quadrant. There is left CVA  tenderness. There is no right CVA tenderness.  Skin:    General: Skin is warm and dry.  Neurological:     Mental Status: She is alert.  Psychiatric:        Mood and Affect: Mood is not anxious.      ED Treatments / Results  Labs (all  labs ordered are listed, but only abnormal results are displayed) Labs Reviewed  URINALYSIS, ROUTINE W REFLEX MICROSCOPIC - Abnormal; Notable for the following components:      Result Value   Glucose, UA 250 (*)    Hgb urine dipstick MODERATE (*)    All other components within normal limits  COMPREHENSIVE METABOLIC PANEL - Abnormal; Notable for the following components:   CO2 19 (*)    Glucose, Bld 247 (*)    All other components within normal limits  CBC - Abnormal; Notable for the following components:   WBC 12.7 (*)    All other components within normal limits  URINALYSIS, MICROSCOPIC (REFLEX) - Abnormal; Notable for the following components:   Bacteria, UA RARE (*)    All other components within normal limits  LIPASE, BLOOD    EKG None  Radiology Ct Renal Stone Study  Result Date: 05/10/2018 CLINICAL DATA:  Left flank pain EXAM: CT ABDOMEN AND PELVIS WITHOUT CONTRAST TECHNIQUE: Multidetector CT imaging of the abdomen and pelvis was performed following the standard protocol without oral or IV contrast. COMPARISON:  None. FINDINGS: Lower chest: There is patchy atelectatic change in the lung bases, somewhat more on the left than on the right. There are foci of coronary artery calcification. Hepatobiliary: There is a 5 mm probable cyst in the posterior segment of the right lobe of the liver. Liver otherwise appears unremarkable on this noncontrast enhanced study. There is cholelithiasis. There is no appreciable gallbladder wall thickening. There is no biliary duct dilatation. Pancreas: There is no pancreatic mass or inflammatory focus. Spleen: No splenic lesions are evident. Adrenals/Urinary Tract: Adrenals bilaterally appear unremarkable. There is  left renal edema with soft tissue stranding and fluid in the left perinephric fascia. There is no appreciable renal mass on either side. There is severe hydronephrosis on the left. There is no appreciable hydronephrosis the right. There is a 1 mm calculus in the upper pole right kidney. There is a 1 mm calculus in the lower pole left kidney. There is a 2 mm calculus at the left ureterovesical junction. No other ureteral calculi are evident. The urinary bladder is largely decompressed. Urinary bladder wall thickness is felt to be within normal limits for nearly empty bladder state. Stomach/Bowel: There is no appreciable bowel wall or mesenteric thickening. There is moderate stool in the colon. There is no demonstrable bowel obstruction. There is no free air or portal venous air. Vascular/Lymphatic: There is aortic tortuosity without aneurysm evident. There are foci of aortic atherosclerosis. There is no appreciable calcification in major mesenteric arterial vessels. There is a retroaortic left renal vein, an anatomic variant. There is no adenopathy in the abdomen or pelvis. Reproductive: The uterus is anteverted. There are multiple calcified masses throughout the uterus consistent with leiomyomatous change. There also several noncalcified masses within the uterus, measuring up to 5.5 x 4.2 cm in size. No extrauterine pelvic masses are evident. Other: Appendix appears normal. There is no abscess or ascites in the abdomen or pelvis. Musculoskeletal: There is degenerative change in the lower thoracic and lumbar spine regions. There are no blastic or lytic bone lesions. There is no intramuscular or abdominal wall lesion evident. IMPRESSION: 1. 2 mm calculus at the left ureterovesical junction. Severe left-sided hydronephrosis with left renal edema as well as left perinephric fluid and soft tissue stranding. 2.  Small nonobstructing calculi in each kidney. 3.  Cholelithiasis. 4. No evident bowel obstruction. No abscess in  the abdomen or pelvis. Appendix region appears  normal. 5.  Enlarged leiomyomatous uterus. 6. Aortic atherosclerosis. Foci of coronary artery calcification also noted. Electronically Signed   By: Lowella Grip III M.D.   On: 05/10/2018 09:13    Procedures Procedures (including critical care time)  Medications Ordered in ED Medications  ondansetron (ZOFRAN) injection 4 mg (4 mg Intravenous Given 05/10/18 0829)  morphine 4 MG/ML injection 4 mg (4 mg Intravenous Given 05/10/18 0842)  HYDROcodone-acetaminophen (NORCO/VICODIN) 5-325 MG per tablet 1 tablet (1 tablet Oral Given 05/10/18 0941)     Initial Impression / Assessment and Plan / ED Course  I have reviewed the triage vital signs and the nursing notes.  Pertinent labs & imaging results that were available during my care of the patient were reviewed by me and considered in my medical decision making (see chart for details).     Patient's pain is much better after dose of IV morphine.  She has no obvious signs of urinary tract infection and is afebrile.  She is no longer vomiting.  CT confirms left ureteral stone and does not show any other severe acute abdominal process.  While there is impressive hydronephrosis of the left kidney, this should resolve once her stone has passed.  Encouraged to drink plenty of fluids and will be prescribed hydrocodone for pain.  We discussed need for urology follow-up as well as return precautions.  Final Clinical Impressions(s) / ED Diagnoses   Final diagnoses:  Left ureteral stone  Hydronephrosis, left    ED Discharge Orders         Ordered    HYDROcodone-acetaminophen (NORCO) 5-325 MG tablet  Every 6 hours PRN     05/10/18 1005    ondansetron (ZOFRAN ODT) 4 MG disintegrating tablet  Every 8 hours PRN     05/10/18 1005           Sherwood Gambler, MD 05/10/18 1052

## 2018-05-10 NOTE — Discharge Instructions (Signed)
If you develop worsening, continued, or recurrent pain, uncontrolled vomiting, fever, chest or back pain, burning with urination or other urinary tract infectious symptoms, or any other new/concerning symptoms then return to the ER for evaluation.

## 2018-05-10 NOTE — ED Triage Notes (Signed)
Patient has left flank pain that began around 11pm yesterday with nausea and vomiting.

## 2018-05-16 DIAGNOSIS — N200 Calculus of kidney: Secondary | ICD-10-CM | POA: Diagnosis not present

## 2018-05-16 DIAGNOSIS — N202 Calculus of kidney with calculus of ureter: Secondary | ICD-10-CM | POA: Diagnosis not present

## 2018-05-28 DIAGNOSIS — N200 Calculus of kidney: Secondary | ICD-10-CM | POA: Diagnosis not present

## 2018-06-06 ENCOUNTER — Other Ambulatory Visit: Payer: Self-pay | Admitting: Urology

## 2018-06-12 ENCOUNTER — Other Ambulatory Visit: Payer: Self-pay

## 2018-06-12 ENCOUNTER — Encounter (INDEPENDENT_AMBULATORY_CARE_PROVIDER_SITE_OTHER): Payer: Self-pay

## 2018-06-12 ENCOUNTER — Encounter (HOSPITAL_COMMUNITY): Payer: Self-pay

## 2018-06-12 ENCOUNTER — Encounter (HOSPITAL_COMMUNITY)
Admission: RE | Admit: 2018-06-12 | Discharge: 2018-06-12 | Disposition: A | Discharge status: Home / Self Care | Payer: Medicare Other | Source: Ambulatory Visit | Attending: Urology | Admitting: Urology

## 2018-06-12 DIAGNOSIS — Z01818 Encounter for other preprocedural examination: Secondary | ICD-10-CM | POA: Diagnosis not present

## 2018-06-12 LAB — CBC
HCT: 42.3 % (ref 36.0–46.0)
Hemoglobin: 13.2 g/dL (ref 12.0–15.0)
MCH: 29.2 pg (ref 26.0–34.0)
MCHC: 31.2 g/dL (ref 30.0–36.0)
MCV: 93.6 fL (ref 80.0–100.0)
PLATELETS: 204 10*3/uL (ref 150–400)
RBC: 4.52 MIL/uL (ref 3.87–5.11)
RDW: 13.3 % (ref 11.5–15.5)
WBC: 9.2 10*3/uL (ref 4.0–10.5)
nRBC: 0 % (ref 0.0–0.2)

## 2018-06-12 LAB — BASIC METABOLIC PANEL
Anion gap: 10 (ref 5–15)
BUN: 19 mg/dL (ref 8–23)
CALCIUM: 10 mg/dL (ref 8.9–10.3)
CO2: 25 mmol/L (ref 22–32)
CREATININE: 0.73 mg/dL (ref 0.44–1.00)
Chloride: 104 mmol/L (ref 98–111)
GFR calc Af Amer: >60 mL/min (ref >60)
GFR calc non Af Amer: >60 mL/min (ref >60)
Glucose, Bld: 120 mg/dL — ABNORMAL HIGH (ref 70–99)
Potassium: 4.2 mmol/L (ref 3.5–5.1)
Sodium: 139 mmol/L (ref 135–145)

## 2018-06-12 LAB — HEMOGLOBIN A1C
Hgb A1c MFr Bld: 6.9 % — ABNORMAL HIGH (ref 4.8–5.6)
MEAN PLASMA GLUCOSE: 151.33 mg/dL

## 2018-06-12 LAB — GLUCOSE, CAPILLARY: Glucose-Capillary: 114 mg/dL — ABNORMAL HIGH (ref 70–99)

## 2018-06-12 NOTE — Patient Instructions (Addendum)
JOVANNA HODGES  06/12/2018   Your procedure is scheduled on: Monday 06/16/2018  Report to [facility] Main  Entrance              Report to admitting at  115  PM    Call this number if you have problems the morning of surgery [phone]    How to Manage Your Diabetes Before and After Surgery  Why is it important to control my blood sugar before and after surgery? . Improving blood sugar levels before and after surgery helps healing and can limit problems. . A way of improving blood sugar control is eating a healthy diet by: o  Eating less sugar and carbohydrates o  Increasing activity/exercise o  Talking with your doctor about reaching your blood sugar goals . High blood sugars (greater than 180 mg/dL) can raise your risk of infections and slow your recovery, so you will need to focus on controlling your diabetes during the weeks before surgery. . Make sure that the doctor who takes care of your diabetes knows about your planned surgery including the date and location.  How do I manage my blood sugar before surgery? . Check your blood sugar at least 4 times a day, starting 2 days before surgery, to make sure that the level is not too high or low. o Check your blood sugar the morning of your surgery when you wake up and every 2 hours until you get to the Short Stay unit. . If your blood sugar is less than 70 mg/dL, you will need to treat for low blood sugar: o Do not take insulin. o Treat a low blood sugar (less than 70 mg/dL) with  cup of clear juice (cranberry or apple), 4 glucose tablets, OR glucose gel. o Recheck blood sugar in 15 minutes after treatment (to make sure it is greater than 70 mg/dL). If your blood sugar is not greater than 70 mg/dL on recheck, call [phone] for further instructions. . Report your blood sugar to the short stay nurse when you get to Short Stay.  . If you are admitted to the hospital after surgery: o Your blood sugar  will be checked by the staff and you will probably be given insulin after surgery (instead of oral diabetes medicines) to make sure you have good blood sugar levels. o The goal for blood sugar control after surgery is 80-180 mg/dL.   WHAT DO I DO ABOUT MY DIABETES MEDICATION?         The day before surgery, Take Metformin (Glucophage as usual!          The day before surgery, Take Sitagliptin (Januvia) as usual!  . Do not take oral diabetes medicines (pills) the morning of surgery.     Remember: Do not eat food  :After Midnight.  May have clear liquids from midnight up until 0915 am then nothing until after surgery!    CLEAR LIQUID DIET   Foods Allowed                                                                     Foods Excluded  Coffee and tea, regular and decaf  liquids that you cannot  Plain Jell-O in any flavor                                             see through such as: Fruit ices (not with fruit pulp)                                     milk, soups, orange juice  Iced Popsicles                                    All solid food Carbonated beverages, regular and diet                                    Cranberry, grape and apple juices Sports drinks like Gatorade Lightly seasoned clear broth or consume(fat free) Sugar, honey syrup  Sample Menu Breakfast                                Lunch                                     Supper Cranberry juice                    Beef broth                            Chicken broth Jell-O                                     Grape juice                           Apple juice Coffee or tea                        Jell-O                                      Popsicle                                                Coffee or tea                        Coffee or tea  _____________________________________________________________________               BRUSH YOUR TEETH MORNING OF SURGERY AND RINSE YOUR MOUTH  OUT, NO CHEWING GUM CANDY OR MINTS.     Take these medicines the morning of surgery with A SIP OF WATER: use eye drops  DO NOT TAKE ANY DIABETIC MEDICATIONS DAY OF YOUR SURGERY!                               You may not have any metal on your body including hair pins and              piercings  Do not wear jewelry, make-up, lotions, powders or perfumes, deodorant             Do not wear nail polish.  Do not shave  48 hours prior to surgery.                Do not bring valuables to the hospital. [place].  Contacts, dentures or bridgework may not be worn into surgery.  Leave suitcase in the car. After surgery it may be brought to your room.     Patients discharged the day of surgery will not be allowed to drive home. IF YOU ARE HAVING SURGERY AND GOING HOME THE SAME DAY, YOU MUST HAVE AN ADULT TO DRIVE YOU HOME AND BE WITH YOU FOR 24 HOURS. YOU MAY GO HOME BY TAXI OR UBER OR ORTHERWISE, BUT AN ADULT MUST ACCOMPANY YOU HOME AND STAY WITH YOU FOR 24 HOURS.  Name and phone number of your driver:[employer]                Please read over the following fact sheets you were given: _____________________________________________________________________             [facility] - Preparing for Surgery Before surgery, you can play an important role.  Because skin is not sterile, your skin needs to be as free of germs as possible.  You can reduce the number of germs on your skin by washing with CHG (chlorahexidine gluconate) soap before surgery.  CHG is an antiseptic cleaner which kills germs and bonds with the skin to continue killing germs even after washing. Please DO NOT use if you have an allergy to CHG or antibacterial soaps.  If your skin becomes reddened/irritated stop using the CHG and inform your nurse when you arrive at Short Stay. Do not shave (including legs and underarms) for at least 48 hours prior to the  first CHG shower.  You may shave your face/neck. Please follow these instructions carefully:  1.  Shower with CHG Soap the night before surgery and the  morning of Surgery.  2.  If you choose to wash your hair, wash your hair first as usual with your  normal  shampoo.  3.  After you shampoo, rinse your hair and body thoroughly to remove the  shampoo.                           4.  Use CHG as you would any other liquid soap.  You can apply chg directly  to the skin and wash                       Gently with a scrungie or clean washcloth.  5.  Apply the CHG Soap to your body ONLY FROM THE NECK DOWN.   Do not use on face/ open  Wound or open sores. Avoid contact with eyes, ears mouth and genitals (private parts).                       Wash face,  Genitals (private parts) with your normal soap.             6.  Wash thoroughly, paying special attention to the area where your surgery  will be performed.  7.  Thoroughly rinse your body with warm water from the neck down.  8.  DO NOT shower/wash with your normal soap after using and rinsing off  the CHG Soap.                9.  Pat yourself dry with a clean towel.            10.  Wear clean pajamas.            11.  Place clean sheets on your bed the night of your first shower and do not  sleep with pets. Day of Surgery : Do not apply any lotions/deodorants the morning of surgery.  Please wear clean clothes to the hospital/surgery center.  FAILURE TO FOLLOW THESE INSTRUCTIONS MAY RESULT IN THE CANCELLATION OF YOUR SURGERY PATIENT SIGNATURE_________________________________  NURSE SIGNATURE__________________________________  ________________________________________________________________________

## 2018-06-15 MED ORDER — GENTAMICIN SULFATE 40 MG/ML IJ SOLN
5.0000 mg/kg | INTRAVENOUS | Status: AC
  Administered 2018-06-16: 297.2 mg via INTRAVENOUS
  Filled 2018-06-15: qty 7.5

## 2018-06-16 ENCOUNTER — Ambulatory Visit (HOSPITAL_COMMUNITY): Payer: Medicare Other | Admitting: Anesthesiology

## 2018-06-16 ENCOUNTER — Encounter (HOSPITAL_COMMUNITY)
Admission: RE | Disposition: A | Discharge status: Home / Self Care | Payer: Self-pay | Source: Home / Self Care | Attending: Urology

## 2018-06-16 ENCOUNTER — Ambulatory Visit (HOSPITAL_COMMUNITY)
Admission: RE | Admit: 2018-06-16 | Discharge: 2018-06-16 | Disposition: A | Discharge status: Home / Self Care | Payer: Medicare Other | Attending: Urology | Admitting: Urology

## 2018-06-16 ENCOUNTER — Other Ambulatory Visit: Payer: Self-pay

## 2018-06-16 ENCOUNTER — Ambulatory Visit (HOSPITAL_COMMUNITY): Payer: Medicare Other | Admitting: Physician Assistant

## 2018-06-16 ENCOUNTER — Encounter (HOSPITAL_COMMUNITY): Payer: Self-pay | Admitting: Emergency Medicine

## 2018-06-16 ENCOUNTER — Ambulatory Visit (HOSPITAL_COMMUNITY): Payer: Medicare Other

## 2018-06-16 DIAGNOSIS — N132 Hydronephrosis with renal and ureteral calculous obstruction: Secondary | ICD-10-CM | POA: Diagnosis not present

## 2018-06-16 DIAGNOSIS — E119 Type 2 diabetes mellitus without complications: Secondary | ICD-10-CM | POA: Insufficient documentation

## 2018-06-16 DIAGNOSIS — Z9989 Dependence on other enabling machines and devices: Secondary | ICD-10-CM | POA: Insufficient documentation

## 2018-06-16 DIAGNOSIS — I10 Essential (primary) hypertension: Secondary | ICD-10-CM | POA: Diagnosis not present

## 2018-06-16 DIAGNOSIS — H5462 Unqualified visual loss, left eye, normal vision right eye: Secondary | ICD-10-CM | POA: Diagnosis not present

## 2018-06-16 DIAGNOSIS — N201 Calculus of ureter: Secondary | ICD-10-CM

## 2018-06-16 DIAGNOSIS — Z7984 Long term (current) use of oral hypoglycemic drugs: Secondary | ICD-10-CM | POA: Diagnosis not present

## 2018-06-16 DIAGNOSIS — G4733 Obstructive sleep apnea (adult) (pediatric): Secondary | ICD-10-CM | POA: Insufficient documentation

## 2018-06-16 DIAGNOSIS — N1330 Unspecified hydronephrosis: Secondary | ICD-10-CM | POA: Diagnosis not present

## 2018-06-16 DIAGNOSIS — R109 Unspecified abdominal pain: Secondary | ICD-10-CM | POA: Diagnosis present

## 2018-06-16 HISTORY — PX: CYSTOSCOPY WITH RETROGRADE PYELOGRAM, URETEROSCOPY AND STENT PLACEMENT: SHX5789

## 2018-06-16 LAB — GLUCOSE, CAPILLARY
Glucose-Capillary: 129 mg/dL — ABNORMAL HIGH (ref 70–99)
Glucose-Capillary: 142 mg/dL — ABNORMAL HIGH (ref 70–99)

## 2018-06-16 SURGERY — CYSTOURETEROSCOPY, WITH RETROGRADE PYELOGRAM AND STENT INSERTION
Anesthesia: General | Site: Ureter | Laterality: Left

## 2018-06-16 MED ORDER — MEPERIDINE HCL 50 MG/ML IJ SOLN: 6.2500 mg | INTRAMUSCULAR | Status: DC | PRN

## 2018-06-16 MED ORDER — LIDOCAINE 2% (20 MG/ML) 5 ML SYRINGE
INTRAMUSCULAR | Status: DC | PRN
  Administered 2018-06-16: 80 mg via INTRAVENOUS

## 2018-06-16 MED ORDER — FENTANYL CITRATE (PF) 100 MCG/2ML IJ SOLN: 25.0000 ug | INTRAMUSCULAR | Status: DC | PRN

## 2018-06-16 MED ORDER — LACTATED RINGERS IV SOLN
INTRAVENOUS | Status: DC
  Administered 2018-06-16: 14:00:00 via INTRAVENOUS

## 2018-06-16 MED ORDER — PROPOFOL 10 MG/ML IV BOLUS
INTRAVENOUS | Status: DC | PRN
  Administered 2018-06-16: 160 mg via INTRAVENOUS

## 2018-06-16 MED ORDER — ONDANSETRON HCL 4 MG/2ML IJ SOLN
INTRAMUSCULAR | Status: AC
  Filled 2018-06-16: qty 2

## 2018-06-16 MED ORDER — SODIUM CHLORIDE 0.9 % IV SOLN
Freq: Once | INTRAVENOUS | Status: AC
  Administered 2018-06-16: 5 mL
  Filled 2018-06-16: qty 50

## 2018-06-16 MED ORDER — FENTANYL CITRATE (PF) 100 MCG/2ML IJ SOLN
INTRAMUSCULAR | Status: AC
  Filled 2018-06-16: qty 2

## 2018-06-16 MED ORDER — LIDOCAINE 2% (20 MG/ML) 5 ML SYRINGE
INTRAMUSCULAR | Status: AC
  Filled 2018-06-16: qty 5

## 2018-06-16 MED ORDER — PROPOFOL 10 MG/ML IV BOLUS
INTRAVENOUS | Status: AC
  Filled 2018-06-16: qty 20

## 2018-06-16 MED ORDER — HYDROCODONE-ACETAMINOPHEN 5-325 MG PO TABS: 1.0000 | ORAL_TABLET | Freq: Four times a day (QID) | ORAL | 0 refills | Status: DC | PRN

## 2018-06-16 MED ORDER — SODIUM CHLORIDE 0.9 % IR SOLN
Status: DC | PRN
  Administered 2018-06-16: 6000 mL

## 2018-06-16 MED ORDER — ONDANSETRON HCL 4 MG/2ML IJ SOLN
INTRAMUSCULAR | Status: DC | PRN
  Administered 2018-06-16: 4 mg via INTRAVENOUS

## 2018-06-16 MED ORDER — FENTANYL CITRATE (PF) 100 MCG/2ML IJ SOLN
INTRAMUSCULAR | Status: DC | PRN
  Administered 2018-06-16: 50 ug via INTRAVENOUS

## 2018-06-16 MED ORDER — PROMETHAZINE HCL 25 MG/ML IJ SOLN: 6.2500 mg | INTRAMUSCULAR | Status: DC | PRN

## 2018-06-16 SURGICAL SUPPLY — 23 items
BAG URO CATCHER STRL LF (MISCELLANEOUS) ×3 IMPLANT
CATH INTERMIT  6FR 70CM (CATHETERS) ×3 IMPLANT
CLOTH BEACON ORANGE TIMEOUT ST (SAFETY) ×3 IMPLANT
COVER SURGICAL LIGHT HANDLE (MISCELLANEOUS) ×3 IMPLANT
COVER WAND RF STERILE (DRAPES) IMPLANT
EXTRACTOR STONE NITINOL NGAGE (UROLOGICAL SUPPLIES) ×3 IMPLANT
FIBER LASER FLEXIVA 1000 (UROLOGICAL SUPPLIES) IMPLANT
FIBER LASER FLEXIVA 365 (UROLOGICAL SUPPLIES) IMPLANT
FIBER LASER FLEXIVA 550 (UROLOGICAL SUPPLIES) IMPLANT
FIBER LASER TRAC TIP (UROLOGICAL SUPPLIES) IMPLANT
GLOVE BIO SURGEON STRL SZ8 (GLOVE) ×3 IMPLANT
GOWN STRL REUS W/TWL XL LVL3 (GOWN DISPOSABLE) ×3 IMPLANT
GUIDEWIRE ANG ZIPWIRE 038X150 (WIRE) ×3 IMPLANT
GUIDEWIRE STR DUAL SENSOR (WIRE) ×3 IMPLANT
IV NS 1000ML (IV SOLUTION) ×3
IV NS 1000ML BAXH (IV SOLUTION) ×2 IMPLANT
KIT TURNOVER KIT A (KITS) IMPLANT
MANIFOLD NEPTUNE II (INSTRUMENTS) ×3 IMPLANT
PACK CYSTO (CUSTOM PROCEDURE TRAY) ×3 IMPLANT
SHEATH URETERAL 12FRX35CM (MISCELLANEOUS) IMPLANT
STENT URET 6FRX26 CONTOUR (STENTS) IMPLANT
TUBE FEEDING 8FR 16IN STR KANG (MISCELLANEOUS) IMPLANT
TUBING CONNECTING 10 (TUBING) ×3 IMPLANT

## 2018-06-16 NOTE — Anesthesia Procedure Notes (Signed)
Procedure Name: LMA Insertion Date/Time: 06/16/2018 3:42 PM Performed by: [place] Araiyah Cumpton C, CRNA Pre-anesthesia Checklist: Patient identified, Emergency Drugs available, Suction available and Patient being monitored Patient Re-evaluated:Patient Re-evaluated prior to induction Oxygen Delivery Method: Circle system utilized Preoxygenation: Pre-oxygenation with 100% oxygen Induction Type: IV induction Ventilation: Mask ventilation without difficulty LMA: LMA inserted LMA Size: 4.0 Number of attempts: 1 Airway Equipment and Method: Bite block Placement Confirmation: positive ETCO2 Tube secured with: Tape Dental Injury: Teeth and Oropharynx as per pre-operative assessment

## 2018-06-16 NOTE — Anesthesia Preprocedure Evaluation (Signed)
Anesthesia Evaluation  Patient identified by MRN, date of birth, ID band Patient awake    Reviewed: Allergy & Precautions, NPO status , Patient's Chart, lab work & pertinent test results  History of Anesthesia Complications (+) Family history of anesthesia reaction  Airway Mallampati: II  TM Distance: >3 FB Neck ROM: Full    Dental  (+) Dental Advisory Given   Pulmonary sleep apnea ,    Pulmonary exam normal breath sounds clear to auscultation       Cardiovascular hypertension, Pt. on medications Normal cardiovascular exam Rhythm:Regular Rate:Normal     Neuro/Psych negative neurological ROS  negative psych ROS   GI/Hepatic Neg liver ROS, GERD  ,  Endo/Other  diabetes, Type 2, Oral Hypoglycemic Agents  Renal/GU negative Renal ROS     Musculoskeletal  (+) Arthritis ,   Abdominal   Peds  Hematology negative hematology ROS (+)   Anesthesia Other Findings   Reproductive/Obstetrics negative OB ROS                             Anesthesia Physical Anesthesia Plan  ASA: II  Anesthesia Plan: General   Post-op Pain Management:    Induction: Intravenous  PONV Risk Score and Plan: 4 or greater and Ondansetron and Treatment may vary due to age or medical condition  Airway Management Planned: LMA  Additional Equipment: None  Intra-op Plan:   Post-operative Plan: Extubation in OR  Informed Consent: I have reviewed the patients History and Physical, chart, labs and discussed the procedure including the risks, benefits and alternatives for the proposed anesthesia with the patient or authorized representative who has indicated his/her understanding and acceptance.     Dental advisory given  Plan Discussed with: CRNA  Anesthesia Plan Comments:         Anesthesia Quick Evaluation

## 2018-06-16 NOTE — Transfer of Care (Signed)
Immediate Anesthesia Transfer of Care Note  Patient: Lindsey Baldwin  Procedure(s) Performed: diagnostic URETEROSCOPY AND STENT PLACEMENT (Left Ureter)  Patient Location: PACU  Anesthesia Type:General  Level of Consciousness: awake, alert  and oriented  Airway & Oxygen Therapy: Patient Spontanous Breathing and Patient connected to face mask oxygen  Post-op Assessment: Report given to RN and Post -op Vital signs reviewed and stable  Post vital signs: Reviewed and stable  Last Vitals:  Vitals Value Taken Time  BP 127/82 06/16/2018  4:13 PM  Temp    Pulse 72 06/16/2018  4:15 PM  Resp 15 06/16/2018  4:15 PM  SpO2 100 % 06/16/2018  4:15 PM  Vitals shown include unvalidated device data.  Last Pain:  Vitals:   06/16/18 1345  TempSrc:   PainSc: 2       Patients Stated Pain Goal: 4 (53/74/82 7078)  Complications: No apparent anesthesia complications

## 2018-06-16 NOTE — Brief Op Note (Signed)
06/16/2018  4:07 PM  PATIENT:  Lindsey Baldwin  73 y.o. female  PRE-OPERATIVE DIAGNOSIS:  LEFT URETERAL CALCULUS, LEFT HYDRONEPHROSIS  POST-OPERATIVE DIAGNOSIS:  LEFT URETERAL CALCULUS, LEFT HYDRONEPHROSIS  PROCEDURE:  Procedure(s) with comments: diagnostic URETEROSCOPY AND STENT PLACEMENT (Left) - 1 HR  SURGEON:  Surgeon(s) and Role:    * Lilya Smitherman, Candee Furbish, MD - Primary  PHYSICIAN ASSISTANT:   ASSISTANTS: none   ANESTHESIA:   general  EBL:  minimal   BLOOD ADMINISTERED:none  DRAINS: left 6x24 JJ ureteral stent with tether   LOCAL MEDICATIONS USED:  NONE  SPECIMEN:  No Specimen  DISPOSITION OF SPECIMEN:  N/A  COUNTS:  YES  TOURNIQUET:  * No tourniquets in log *  DICTATION: .Note written in EPIC  PLAN OF CARE: Discharge to home after PACU  PATIENT DISPOSITION:  PACU - hemodynamically stable.   Delay start of Pharmacological VTE agent (>24hrs) due to surgical blood loss or risk of bleeding: not applicable

## 2018-06-16 NOTE — H&P (Signed)
Urology Admission H&P  Chief Complaint: left flank pain  History of Present Illness: Lindsey Baldwin is a 73yo with a hx of left ureteral calculus who failed medical expulsive therapy. She has intermittent left flank pain. No LUTS. No fevers/chills/sweats  Past Medical History:  Diagnosis Date  . 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 Family history of adverse reaction to anesthesia    MOTHER-- PONV  . GERD (gastroesophageal reflux disease)   . Glaucoma, right eye    CLOSED ANGLE  . Hypertension   . OA (osteoarthritis)    KNEES , HANDS  . OAB (overactive bladder)   . OSA on CPAP   . Osteoporosis   . Type 2 diabetes mellitus ([place])    FOLLOWED BY PCP  . Wears glasses    Past Surgical History:  Procedure Laterality Date  . CYST EXCISION  08/2017   POSTERIOR NECK  . EYE SURGERY Left 1992   REMOVE EYE AND PLACEMENT PROSTHESIS  . KNEE ARTHROPLASTY Right 12/12/2017   Procedure: RIGHT TOTAL KNEE ARTHROPLASTY WITH COMPUTER NAVIGATION;  Surgeon: Rod Can, MD;  Location: [facility];  Service: Orthopedics;  Laterality: Right;  Needs RNFA  . TONSILLECTOMY  AGE 70    Home Medications:  Current Facility-Administered Medications  Medication Dose Route Frequency Provider Last Rate Last Dose  . gentamicin (GARAMYCIN) 300 mg in dextrose 5 % 100 mL IVPB  5 mg/kg (Adjusted) Intravenous 30 min Pre-Op Alyson Ingles Candee Furbish, MD      . lactated ringers infusion   Intravenous Continuous Nolon Nations, MD 50 mL/hr at 06/16/18 1401     Allergies:  Allergies  Allergen Reactions  . Penicillins Swelling and Other (See Comments)    Has patient had a PCN reaction causing immediate rash, facial/tongue/throat swelling, SOB or lightheadedness with hypotension: YES Has patient had a PCN reaction causing severe rash involving mucus membranes or skin necrosis: NO Has patient had a PCN reaction that required hospitalization: NO Has patient had a PCN reaction occurring within the last 10 years: NO If all of the  above answers are "NO", then may proceed with Cephalosporin use.   . Sulfa Antibiotics Rash    Family History  Problem Relation Age of Onset  . Cancer Mother   . Cancer Father    Social History:  reports that she has never smoked. She has never used smokeless tobacco. She reports that she does not drink alcohol or use drugs.  Review of Systems  All other systems reviewed and are negative.   Physical Exam:  Vital signs in last 24 hours: Temp:  [97.8 F (36.6 C)] 97.8 F (36.6 C) (03/16 1339) Pulse Rate:  [84] 84 (03/16 1339) Resp:  [14] 14 (03/16 1339) BP: (162)/(78) 162/78 (03/16 1339) SpO2:  [100 %] 100 % (03/16 1339) Weight:  [73.3 kg] 73.3 kg (03/16 1345) Physical Exam  Constitutional: She is oriented to person, place, and time. She appears well-developed and well-nourished.  HENT:  Head: Normocephalic and atraumatic.  Eyes: Pupils are equal, round, and reactive to light. EOM are normal.  Neck: Normal range of motion. No thyromegaly present.  Cardiovascular: Normal rate and regular rhythm.  Respiratory: Effort normal. No respiratory distress.  GI: Soft. She exhibits no distension.  Musculoskeletal: Normal range of motion.        General: No edema.  Neurological: She is alert and oriented to person, place, and time.  Skin: Skin is warm and dry.  Psychiatric: She has a normal mood  and affect. Her behavior is normal. Judgment and thought content normal.    Laboratory Data:  Results for orders placed or performed during the hospital encounter of 06/16/18 (from the past 24 hour(s))  Glucose, capillary     Status: Abnormal   Collection Time: 06/16/18  1:42 PM  Result Value Ref Range   Glucose-Capillary 129 (H) 70 - 99 mg/dL   No results found for this or any previous visit (from the past 240 hour(s)). Creatinine: Recent Labs    06/12/18 1417  CREATININE 0.73   Baseline Creatinine: 0.7  Impression/Assessment:  72yo with left ureteral calculus  Plan:  The  risks/benefits/alternatives to left ureteroscopic stone extraction was explained to the patient and she understands and wishes to proceed with surgery  Lindsey Baldwin 06/16/2018, 3:12 PM

## 2018-06-16 NOTE — Discharge Instructions (Signed)

## 2018-06-16 NOTE — Anesthesia Postprocedure Evaluation (Signed)
Anesthesia Post Note  Patient: Lindsey Baldwin  Procedure(s) Performed: diagnostic URETEROSCOPY AND STENT PLACEMENT (Left Ureter)     Patient location during evaluation: PACU Anesthesia Type: General Level of consciousness: sedated and patient cooperative Pain management: pain level controlled Vital Signs Assessment: post-procedure vital signs reviewed and stable Respiratory status: spontaneous breathing Cardiovascular status: stable Anesthetic complications: no    Last Vitals:  Vitals:   06/16/18 1630 06/16/18 1655  BP: 136/76 (!) 154/76  Pulse: 62 62  Resp: 15 15  Temp: 36.5 C (!) 36.4 C  SpO2: 100% 100%    Last Pain:  Vitals:   06/16/18 1630  TempSrc:   PainSc: 0-No pain                 Nolon Nations

## 2018-06-17 ENCOUNTER — Encounter (HOSPITAL_COMMUNITY): Payer: Self-pay | Admitting: Urology

## 2018-06-19 DIAGNOSIS — N201 Calculus of ureter: Secondary | ICD-10-CM | POA: Diagnosis not present

## 2018-06-24 DIAGNOSIS — G4733 Obstructive sleep apnea (adult) (pediatric): Secondary | ICD-10-CM | POA: Diagnosis not present

## 2018-06-25 NOTE — Op Note (Addendum)
Preoperative diagnosis: Left hydronephrosis  Postoperative diagnosis: Same  Procedure: 1 cystoscopy 2.  left retrograde pyelography 3.  Intraoperative fluoroscopy, under one hour, with interpretation 4.  Left diagnostic ureteroscopy 5. Left 6x24 JJ ureteral stent placement  Attending: Rosie Fate  Anesthesia: General  Estimated blood loss: None  Drains: left 6x24 JJ ureteral stent with tether  Specimens: none  Antibiotics: ancef  Findings: no masses/lesions in the bladder. Ureteral orifices in the normal anatomic location. No left hydronephrosis. No lesions/stones in the ureter. .  Indications: Patient is a 73 year old female with a history of hydronephrosis and left ureteral calculus.  After discussing treatment options, she decided proceed with left ureteroscopy.  Procedure her in detail: The patient was brought to the operating room and a brief timeout was done to ensure correct patient, correct procedure, correct site.  General anesthesia was administered patient was placed in dorsal lithotomy position.  Her genitalia was then prepped and draped in usual sterile fashion.  A rigid 28 French cystoscope was passed in the urethra and the bladder.  Bladder was inspected free masses or lesions.  the ureteral orifices were in the normal orthotopic locations.  a 6 french ureteral catheter was then instilled into the left ureter orifice.  a gentle retrograde was obtained and findings noted above.  we then placed a zip wire through the ureteral catheter and advanced up to the renal pelvis.  we then removed the cystoscope and cannulated the left ureteral orifice with a semirigid ureteroscope.  we then performed ureteroscopy up to the level of the UPJ. No stone or tumor was encountered. We then elected to place a stent over the original zip wire. We advanced a 6x24 JJ ureteral stent up to the renal pelvis. We then removed the wire and good coil was noted in the renal pelvis under fluoroscopy  and the bladder under direct vision.  the bladder was then drained and this concluded the procedure which was well tolerated by patient.  Complications: None  Condition: Stable, extubated, transferred to PACU  Plan: Pt is to followup in 2 weeks. She is to remove her stent in 3 days by pulling the tether

## 2018-07-01 DIAGNOSIS — N200 Calculus of kidney: Secondary | ICD-10-CM | POA: Diagnosis not present

## 2018-07-31 DIAGNOSIS — N200 Calculus of kidney: Secondary | ICD-10-CM | POA: Diagnosis not present

## 2018-08-05 DIAGNOSIS — E1140 Type 2 diabetes mellitus with diabetic neuropathy, unspecified: Secondary | ICD-10-CM | POA: Diagnosis not present

## 2018-08-05 DIAGNOSIS — N200 Calculus of kidney: Secondary | ICD-10-CM | POA: Diagnosis not present

## 2018-08-05 DIAGNOSIS — L918 Other hypertrophic disorders of the skin: Secondary | ICD-10-CM | POA: Diagnosis not present

## 2018-08-05 DIAGNOSIS — E7800 Pure hypercholesterolemia, unspecified: Secondary | ICD-10-CM | POA: Diagnosis not present

## 2018-08-18 ENCOUNTER — Other Ambulatory Visit: Payer: Self-pay | Admitting: Family Medicine

## 2018-08-18 DIAGNOSIS — Z1231 Encounter for screening mammogram for malignant neoplasm of breast: Secondary | ICD-10-CM

## 2018-08-27 DIAGNOSIS — Z4420 Encounter for fitting and adjustment of artificial eye, unspecified: Secondary | ICD-10-CM | POA: Diagnosis not present

## 2018-09-10 DIAGNOSIS — G4733 Obstructive sleep apnea (adult) (pediatric): Secondary | ICD-10-CM | POA: Diagnosis not present

## 2018-09-25 DIAGNOSIS — H548 Legal blindness, as defined in USA: Secondary | ICD-10-CM | POA: Diagnosis not present

## 2018-09-25 DIAGNOSIS — H401113 Primary open-angle glaucoma, right eye, severe stage: Secondary | ICD-10-CM | POA: Diagnosis not present

## 2018-09-25 DIAGNOSIS — Z970 Presence of artificial eye: Secondary | ICD-10-CM | POA: Diagnosis not present

## 2018-10-06 ENCOUNTER — Ambulatory Visit
Admission: RE | Admit: 2018-10-06 | Discharge: 2018-10-06 | Disposition: A | Discharge status: Home / Self Care | Payer: Medicare Other | Source: Ambulatory Visit | Attending: Family Medicine | Admitting: Family Medicine

## 2018-10-06 ENCOUNTER — Other Ambulatory Visit: Payer: Self-pay

## 2018-10-06 DIAGNOSIS — Z1231 Encounter for screening mammogram for malignant neoplasm of breast: Secondary | ICD-10-CM | POA: Diagnosis not present

## 2018-10-16 DIAGNOSIS — G4733 Obstructive sleep apnea (adult) (pediatric): Secondary | ICD-10-CM | POA: Diagnosis not present

## 2018-11-17 DIAGNOSIS — G4733 Obstructive sleep apnea (adult) (pediatric): Secondary | ICD-10-CM | POA: Diagnosis not present

## 2018-12-30 DIAGNOSIS — N201 Calculus of ureter: Secondary | ICD-10-CM | POA: Diagnosis not present

## 2019-01-14 DIAGNOSIS — N201 Calculus of ureter: Secondary | ICD-10-CM | POA: Diagnosis not present

## 2019-01-14 DIAGNOSIS — R1084 Generalized abdominal pain: Secondary | ICD-10-CM | POA: Diagnosis not present

## 2019-01-14 DIAGNOSIS — N200 Calculus of kidney: Secondary | ICD-10-CM | POA: Diagnosis not present

## 2019-01-14 DIAGNOSIS — R109 Unspecified abdominal pain: Secondary | ICD-10-CM | POA: Diagnosis not present

## 2019-01-21 DIAGNOSIS — M545 Low back pain: Secondary | ICD-10-CM | POA: Diagnosis not present

## 2019-01-27 DIAGNOSIS — N201 Calculus of ureter: Secondary | ICD-10-CM | POA: Diagnosis not present

## 2019-01-31 ENCOUNTER — Other Ambulatory Visit: Payer: Self-pay

## 2019-01-31 DIAGNOSIS — Z20822 Contact with and (suspected) exposure to covid-19: Secondary | ICD-10-CM

## 2019-02-02 ENCOUNTER — Telehealth: Payer: Self-pay | Admitting: General Practice

## 2019-02-02 LAB — NOVEL CORONAVIRUS, NAA: SARS-CoV-2, NAA: NOT DETECTED

## 2019-02-02 NOTE — Telephone Encounter (Signed)
Negative COVID results given. Patient results "NOT Detected." Caller expressed understanding. ° °

## 2019-02-11 DIAGNOSIS — Z20828 Contact with and (suspected) exposure to other viral communicable diseases: Secondary | ICD-10-CM | POA: Diagnosis not present

## 2019-02-11 DIAGNOSIS — Z03818 Encounter for observation for suspected exposure to other biological agents ruled out: Secondary | ICD-10-CM | POA: Diagnosis not present

## 2019-02-12 ENCOUNTER — Other Ambulatory Visit: Payer: Self-pay

## 2019-02-12 ENCOUNTER — Emergency Department (HOSPITAL_COMMUNITY): Payer: Medicare Other

## 2019-02-12 ENCOUNTER — Encounter (HOSPITAL_COMMUNITY): Payer: Self-pay

## 2019-02-12 ENCOUNTER — Emergency Department (HOSPITAL_COMMUNITY)
Admission: EM | Admit: 2019-02-12 | Discharge: 2019-02-12 | Disposition: A | Discharge status: Home / Self Care | Payer: Medicare Other | Source: Home / Self Care | Attending: Emergency Medicine | Admitting: Emergency Medicine

## 2019-02-12 DIAGNOSIS — Z09 Encounter for follow-up examination after completed treatment for conditions other than malignant neoplasm: Secondary | ICD-10-CM | POA: Diagnosis not present

## 2019-02-12 DIAGNOSIS — J209 Acute bronchitis, unspecified: Secondary | ICD-10-CM | POA: Insufficient documentation

## 2019-02-12 DIAGNOSIS — M7918 Myalgia, other site: Secondary | ICD-10-CM | POA: Insufficient documentation

## 2019-02-12 DIAGNOSIS — E781 Pure hyperglyceridemia: Secondary | ICD-10-CM | POA: Diagnosis not present

## 2019-02-12 DIAGNOSIS — Z20822 Contact with and (suspected) exposure to covid-19: Secondary | ICD-10-CM

## 2019-02-12 DIAGNOSIS — M810 Age-related osteoporosis without current pathological fracture: Secondary | ICD-10-CM | POA: Diagnosis present

## 2019-02-12 DIAGNOSIS — Z9989 Dependence on other enabling machines and devices: Secondary | ICD-10-CM | POA: Diagnosis not present

## 2019-02-12 DIAGNOSIS — M2550 Pain in unspecified joint: Secondary | ICD-10-CM | POA: Diagnosis not present

## 2019-02-12 DIAGNOSIS — R0600 Dyspnea, unspecified: Secondary | ICD-10-CM | POA: Diagnosis not present

## 2019-02-12 DIAGNOSIS — I10 Essential (primary) hypertension: Secondary | ICD-10-CM | POA: Diagnosis not present

## 2019-02-12 DIAGNOSIS — G4733 Obstructive sleep apnea (adult) (pediatric): Secondary | ICD-10-CM | POA: Diagnosis not present

## 2019-02-12 DIAGNOSIS — J189 Pneumonia, unspecified organism: Secondary | ICD-10-CM | POA: Diagnosis not present

## 2019-02-12 DIAGNOSIS — Z882 Allergy status to sulfonamides status: Secondary | ICD-10-CM | POA: Diagnosis not present

## 2019-02-12 DIAGNOSIS — U071 COVID-19: Secondary | ICD-10-CM | POA: Diagnosis not present

## 2019-02-12 DIAGNOSIS — Z209 Contact with and (suspected) exposure to unspecified communicable disease: Secondary | ICD-10-CM | POA: Diagnosis not present

## 2019-02-12 DIAGNOSIS — J069 Acute upper respiratory infection, unspecified: Secondary | ICD-10-CM | POA: Diagnosis not present

## 2019-02-12 DIAGNOSIS — Z880 Allergy status to penicillin: Secondary | ICD-10-CM | POA: Diagnosis not present

## 2019-02-12 DIAGNOSIS — R41841 Cognitive communication deficit: Secondary | ICD-10-CM | POA: Diagnosis not present

## 2019-02-12 DIAGNOSIS — Y92239 Unspecified place in hospital as the place of occurrence of the external cause: Secondary | ICD-10-CM | POA: Diagnosis present

## 2019-02-12 DIAGNOSIS — R509 Fever, unspecified: Secondary | ICD-10-CM | POA: Insufficient documentation

## 2019-02-12 DIAGNOSIS — M6281 Muscle weakness (generalized): Secondary | ICD-10-CM | POA: Diagnosis not present

## 2019-02-12 DIAGNOSIS — Z7401 Bed confinement status: Secondary | ICD-10-CM | POA: Diagnosis not present

## 2019-02-12 DIAGNOSIS — J9600 Acute respiratory failure, unspecified whether with hypoxia or hypercapnia: Secondary | ICD-10-CM | POA: Diagnosis not present

## 2019-02-12 DIAGNOSIS — R0602 Shortness of breath: Secondary | ICD-10-CM | POA: Insufficient documentation

## 2019-02-12 DIAGNOSIS — E669 Obesity, unspecified: Secondary | ICD-10-CM | POA: Diagnosis not present

## 2019-02-12 DIAGNOSIS — E119 Type 2 diabetes mellitus without complications: Secondary | ICD-10-CM | POA: Diagnosis not present

## 2019-02-12 DIAGNOSIS — H5462 Unqualified visual loss, left eye, normal vision right eye: Secondary | ICD-10-CM | POA: Diagnosis not present

## 2019-02-12 DIAGNOSIS — K219 Gastro-esophageal reflux disease without esophagitis: Secondary | ICD-10-CM | POA: Diagnosis not present

## 2019-02-12 DIAGNOSIS — Z791 Long term (current) use of non-steroidal anti-inflammatories (NSAID): Secondary | ICD-10-CM | POA: Diagnosis not present

## 2019-02-12 DIAGNOSIS — J1289 Other viral pneumonia: Secondary | ICD-10-CM | POA: Diagnosis not present

## 2019-02-12 DIAGNOSIS — R079 Chest pain, unspecified: Secondary | ICD-10-CM | POA: Diagnosis not present

## 2019-02-12 DIAGNOSIS — Z803 Family history of malignant neoplasm of breast: Secondary | ICD-10-CM | POA: Diagnosis not present

## 2019-02-12 DIAGNOSIS — Z6830 Body mass index (BMI) 30.0-30.9, adult: Secondary | ICD-10-CM | POA: Diagnosis not present

## 2019-02-12 DIAGNOSIS — R7989 Other specified abnormal findings of blood chemistry: Secondary | ICD-10-CM | POA: Diagnosis not present

## 2019-02-12 DIAGNOSIS — R0689 Other abnormalities of breathing: Secondary | ICD-10-CM | POA: Diagnosis not present

## 2019-02-12 DIAGNOSIS — H409 Unspecified glaucoma: Secondary | ICD-10-CM | POA: Diagnosis present

## 2019-02-12 DIAGNOSIS — Z7984 Long term (current) use of oral hypoglycemic drugs: Secondary | ICD-10-CM | POA: Insufficient documentation

## 2019-02-12 DIAGNOSIS — J159 Unspecified bacterial pneumonia: Secondary | ICD-10-CM | POA: Diagnosis not present

## 2019-02-12 DIAGNOSIS — J9601 Acute respiratory failure with hypoxia: Secondary | ICD-10-CM | POA: Diagnosis not present

## 2019-02-12 DIAGNOSIS — R2689 Other abnormalities of gait and mobility: Secondary | ICD-10-CM | POA: Diagnosis not present

## 2019-02-12 DIAGNOSIS — R05 Cough: Secondary | ICD-10-CM | POA: Insufficient documentation

## 2019-02-12 DIAGNOSIS — T380X5A Adverse effect of glucocorticoids and synthetic analogues, initial encounter: Secondary | ICD-10-CM | POA: Diagnosis not present

## 2019-02-12 DIAGNOSIS — R52 Pain, unspecified: Secondary | ICD-10-CM | POA: Diagnosis not present

## 2019-02-12 DIAGNOSIS — Z79899 Other long term (current) drug therapy: Secondary | ICD-10-CM | POA: Insufficient documentation

## 2019-02-12 DIAGNOSIS — R0902 Hypoxemia: Secondary | ICD-10-CM | POA: Diagnosis not present

## 2019-02-12 DIAGNOSIS — Z7982 Long term (current) use of aspirin: Secondary | ICD-10-CM | POA: Insufficient documentation

## 2019-02-12 DIAGNOSIS — Z96651 Presence of right artificial knee joint: Secondary | ICD-10-CM | POA: Insufficient documentation

## 2019-02-12 DIAGNOSIS — R070 Pain in throat: Secondary | ICD-10-CM | POA: Insufficient documentation

## 2019-02-12 DIAGNOSIS — R000 Tachycardia, unspecified: Secondary | ICD-10-CM | POA: Diagnosis not present

## 2019-02-12 DIAGNOSIS — E1165 Type 2 diabetes mellitus with hyperglycemia: Secondary | ICD-10-CM | POA: Diagnosis not present

## 2019-02-12 DIAGNOSIS — M1712 Unilateral primary osteoarthritis, left knee: Secondary | ICD-10-CM | POA: Diagnosis present

## 2019-02-12 DIAGNOSIS — B349 Viral infection, unspecified: Secondary | ICD-10-CM | POA: Diagnosis not present

## 2019-02-12 DIAGNOSIS — R498 Other voice and resonance disorders: Secondary | ICD-10-CM | POA: Diagnosis not present

## 2019-02-12 DIAGNOSIS — E785 Hyperlipidemia, unspecified: Secondary | ICD-10-CM | POA: Diagnosis not present

## 2019-02-12 LAB — CBC WITH DIFFERENTIAL/PLATELET
Abs Immature Granulocytes: 0.01 10*3/uL (ref 0.00–0.07)
Basophils Absolute: 0 10*3/uL (ref 0.0–0.1)
Basophils Relative: 1 %
Eosinophils Absolute: 0 10*3/uL (ref 0.0–0.5)
Eosinophils Relative: 0 %
HCT: 40.2 % (ref 36.0–46.0)
Hemoglobin: 13.2 g/dL (ref 12.0–15.0)
Immature Granulocytes: 0 %
Lymphocytes Relative: 19 %
Lymphs Abs: 1 10*3/uL (ref 0.7–4.0)
MCH: 30.6 pg (ref 26.0–34.0)
MCHC: 32.8 g/dL (ref 30.0–36.0)
MCV: 93.1 fL (ref 80.0–100.0)
Monocytes Absolute: 0.5 10*3/uL (ref 0.1–1.0)
Monocytes Relative: 9 %
Neutro Abs: 4 10*3/uL (ref 1.7–7.7)
Neutrophils Relative %: 71 %
Platelets: 127 10*3/uL — ABNORMAL LOW (ref 150–400)
RBC: 4.32 MIL/uL (ref 3.87–5.11)
RDW: 13.7 % (ref 11.5–15.5)
WBC: 5.6 10*3/uL (ref 4.0–10.5)
nRBC: 0 % (ref 0.0–0.2)

## 2019-02-12 LAB — BASIC METABOLIC PANEL
Anion gap: 11 (ref 5–15)
BUN: 19 mg/dL (ref 8–23)
CO2: 21 mmol/L — ABNORMAL LOW (ref 22–32)
Calcium: 9 mg/dL (ref 8.9–10.3)
Chloride: 105 mmol/L (ref 98–111)
Creatinine, Ser: 1 mg/dL (ref 0.44–1.00)
GFR calc Af Amer: >60 mL/min (ref >60)
GFR calc non Af Amer: 57 mL/min — ABNORMAL LOW (ref >60)
Glucose, Bld: 200 mg/dL — ABNORMAL HIGH (ref 70–99)
Potassium: 4.2 mmol/L (ref 3.5–5.1)
Sodium: 137 mmol/L (ref 135–145)

## 2019-02-12 MED ORDER — ALBUTEROL SULFATE HFA 108 (90 BASE) MCG/ACT IN AERS
2.0000 | INHALATION_SPRAY | RESPIRATORY_TRACT | Status: DC | PRN
  Administered 2019-02-12: 2 via RESPIRATORY_TRACT
  Filled 2019-02-12: qty 6.7

## 2019-02-12 NOTE — ED Notes (Addendum)
Friend/Caregiver Seth Bake arranged for an Lindsey Baldwin to take pt back to facility.

## 2019-02-12 NOTE — ED Provider Notes (Signed)
[facility] DEPT Provider Note: Georgena Spurling, MD, FACEP  CSN: [id number] MRN: [id number] ARRIVAL: 02/12/19 at [place]: WA09/WA09   CHIEF COMPLAINT  Shortness of Breath, Fever, and Cough   HISTORY OF PRESENT ILLNESS  02/12/19 1:13 AM Lindsey Baldwin is a 73 y.o. female with a 1 week history of nasal congestion, sore throat and body aches.  Over the past 2 days she has developed a cough, fever and shortness of breath.  Her temperature was as high as 102, treated with Tylenol 1 g with improvement.  Her temperature was 100.4 on arrival.  Her shortness of breath is worse with exertion and even during conversation.  EMS noted her oxygen saturation as low as 91% during ambulation prior to arrival.  She rates her body aches as a 4 out of 10.  She denies change in smell or taste.  She denies nausea, vomiting or diarrhea.    Past Medical History:  Diagnosis Date  . 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 Family history of adverse reaction to anesthesia    MOTHER-- PONV  . GERD (gastroesophageal reflux disease)   . Glaucoma, right eye    CLOSED ANGLE  . Hypertension   . OA (osteoarthritis)    KNEES , HANDS  . OAB (overactive bladder)   . OSA on CPAP   . Osteoporosis   . Type 2 diabetes mellitus ([place])    FOLLOWED BY PCP  . Wears glasses     Past Surgical History:  Procedure Laterality Date  . CYST EXCISION  08/2017   POSTERIOR NECK  . CYSTOSCOPY WITH RETROGRADE PYELOGRAM, URETEROSCOPY AND STENT PLACEMENT Left 06/16/2018   Procedure: diagnostic URETEROSCOPY AND STENT PLACEMENT;  Surgeon: Cleon Gustin, MD;  Location: [facility];  Service: Urology;  Laterality: Left;  1 HR  . EYE SURGERY Left 1992   REMOVE EYE AND PLACEMENT PROSTHESIS  . KNEE ARTHROPLASTY Right 12/12/2017   Procedure: RIGHT TOTAL KNEE ARTHROPLASTY WITH COMPUTER NAVIGATION;  Surgeon: Rod Can, MD;  Location: [facility];  Service: Orthopedics;  Laterality: Right;  Needs RNFA  . TONSILLECTOMY  AGE 60     Family History  Problem Relation Age of Onset  . Cancer Mother   . Cancer Father   . Breast cancer Maternal Aunt     Social History   Tobacco Use  . Smoking status: Never Smoker  . Smokeless tobacco: Never Used  Substance Use Topics  . Alcohol use: No  . Drug use: Never    Prior to Admission medications   Medication Sig Start Date End Date Taking? Authorizing Provider  acetaminophen (TYLENOL) 500 MG tablet Take 500 mg by mouth every 6 (six) hours as needed (for pain.).    [provider]  apraclonidine (IOPIDINE) 0.5 % ophthalmic solution Place 1 drop into the right eye 2 (two) times daily. 11/21/17   [provider]  aspirin 81 MG chewable tablet Chew 1 tablet (81 mg total) by mouth 2 (two) times daily. 12/13/17   Swinteck, Aaron Edelman, MD  atorvastatin (LIPITOR) 40 MG tablet Take 40 mg by mouth every evening.     [provider]  Calcium Carb-Cholecalciferol (CALCIUM + D3 PO) Take 1 tablet by mouth daily.    [provider]  calcium carbonate (TUMS - DOSED IN MG ELEMENTAL CALCIUM) 500 MG chewable tablet Chew 1 tablet by mouth 4 (four) times daily as needed for indigestion or heartburn.     [provider]  enalapril (VASOTEC)  2.5 MG tablet Take 2.5 mg by mouth daily.     [provider]  furosemide (LASIX) 20 MG tablet Take 20 mg by mouth daily.     [provider]  HYDROcodone-acetaminophen (NORCO) 5-325 MG tablet Take 1 tablet by mouth every 6 (six) hours as needed for severe pain. 06/16/18   McKenzie, Candee Furbish, MD  ibuprofen (ADVIL,MOTRIN) 200 MG tablet Take 200 mg by mouth every 8 (eight) hours as needed (pain.).    [provider]  latanoprost (XALATAN) 0.005 % ophthalmic solution Place 1 drop into the right eye at bedtime.    [provider]  metFORMIN (GLUCOPHAGE-XR) 500 MG 24 hr tablet Take 1,000 mg by mouth daily. 04/21/18   [provider]  mirabegron ER (MYRBETRIQ) 50 MG TB24 tablet Take 50  mg by mouth daily.     [provider]  Multiple Vitamins-Minerals (MULTIVITAMIN WITH MINERALS) tablet Take 1 tablet by mouth daily.    [provider]  sitaGLIPtin (JANUVIA) 100 MG tablet Take 100 mg by mouth every morning.    [provider]  timolol (BETIMOL) 0.5 % ophthalmic solution Place 1 drop into the right eye 2 (two) times daily.    [provider]    Allergies Penicillins and Sulfa antibiotics   REVIEW OF SYSTEMS  Negative except as noted here or in the History of Present Illness.   PHYSICAL EXAMINATION  Initial Vital Signs Blood pressure 135/68, pulse 84, temperature (!) 100.4 F (38 C), temperature source Oral, resp. rate 15, height 5\' 2"  (1.575 m), SpO2 95 %.  Examination General: Well-developed, well-nourished female in no acute distress; appearance consistent with age of record HENT: normocephalic; atraumatic Eyes: Prosthetic left eye; right pupil round and reactive to light; extraocular muscles intact Neck: supple Heart: regular rate and rhythm Lungs: Few basilar rales bilaterally Abdomen: soft; nondistended; nontender; bowel sounds present Extremities: No deformity; full range of motion; pulses normal; trace edema of lower legs Neurologic: Awake, alert and oriented; motor function intact in all extremities and symmetric; no facial droop Skin: Warm and dry Psychiatric: Normal mood and affect   RESULTS  Summary of this visit's results, reviewed and interpreted by myself:   EKG Interpretation  Date/Time:    Ventricular Rate:    PR Interval:    QRS Duration:   QT Interval:    QTC Calculation:   R Axis:     Text Interpretation:        Laboratory Studies: Results for orders placed or performed during the hospital encounter of 02/12/19 (from the past 24 hour(s))  CBC with Differential     Status: Abnormal   Collection Time: 02/12/19  1:24 AM  Result Value Ref Range   WBC 5.6 4.0 - 10.5 K/uL   RBC 4.32 3.87 - 5.11  MIL/uL   Hemoglobin 13.2 12.0 - 15.0 g/dL   HCT 40.2 36.0 - 46.0 %   MCV 93.1 80.0 - 100.0 fL   MCH 30.6 26.0 - 34.0 pg   MCHC 32.8 30.0 - 36.0 g/dL   RDW 13.7 11.5 - 15.5 %   Platelets 127 (L) 150 - 400 K/uL   nRBC 0.0 0.0 - 0.2 %   Neutrophils Relative % 71 %   Neutro Abs 4.0 1.7 - 7.7 K/uL   Lymphocytes Relative 19 %   Lymphs Abs 1.0 0.7 - 4.0 K/uL   Monocytes Relative 9 %   Monocytes Absolute 0.5 0.1 - 1.0 K/uL   Eosinophils Relative 0 %  Eosinophils Absolute 0.0 0.0 - 0.5 K/uL   Basophils Relative 1 %   Basophils Absolute 0.0 0.0 - 0.1 K/uL   Immature Granulocytes 0 %   Abs Immature Granulocytes 0.01 0.00 - 0.07 K/uL  Basic metabolic panel     Status: Abnormal   Collection Time: 02/12/19  1:24 AM  Result Value Ref Range   Sodium 137 135 - 145 mmol/L   Potassium 4.2 3.5 - 5.1 mmol/L   Chloride 105 98 - 111 mmol/L   CO2 21 (L) 22 - 32 mmol/L   Glucose, Bld 200 (H) 70 - 99 mg/dL   BUN 19 8 - 23 mg/dL   Creatinine, Ser 1.00 0.44 - 1.00 mg/dL   Calcium 9.0 8.9 - 10.3 mg/dL   GFR calc non Af Amer 57 (L) >60 mL/min   GFR calc Af Amer >60 >60 mL/min   Anion gap 11 5 - 15   Imaging Studies: Dg Chest Port 1 View  Result Date: 02/12/2019 CLINICAL DATA:  Shortness of breath, cough and fever EXAM: PORTABLE CHEST 1 VIEW COMPARISON:  July 06, 2017 FINDINGS: The heart size and mediastinal contours are within normal limits. Both lungs are clear. The visualized skeletal structures are unremarkable. IMPRESSION: No active disease. Electronically Signed   By: Prudencio Pair M.D.   On: 02/12/2019 01:41    ED COURSE and MDM  Nursing notes, initial and subsequent vitals signs, including pulse oximetry, reviewed and interpreted by myself.  Vitals:   02/12/19 0100 02/12/19 0130 02/12/19 0200 02/12/19 0230  BP: 109/69 130/64 137/71 126/76  Pulse: 87 84 81 79  Resp: 15 (!) 21 17 20   Temp:      TempSrc:      SpO2: 94% 95% 95% 97%  Height:       Medications  albuterol (VENTOLIN HFA)  108 (90 Base) MCG/ACT inhaler 2 puff (has no administration in time range)   2:53 AM Patient feels better, air movement improved after albuterol inhaler treatment.  Oxygen saturation 98 to 99% on room air.  Chest x-ray is clear.  The patient may have COVID-19 (test results are due tomorrow) but does not appear to need hospitalization at this time.  Lindsey Baldwin was evaluated in Emergency Department on 02/12/2019 for the symptoms described in the history of present illness. She was evaluated in the context of the global COVID-19 pandemic, which necessitated consideration that the patient might be at risk for infection with the SARS-CoV-2 virus that causes COVID-19. Institutional protocols and algorithms that pertain to the evaluation of patients at risk for COVID-19 are in a state of rapid change based on information released by regulatory bodies including the CDC and federal and state organizations. These policies and algorithms were followed during the patient's care in the ED.   PROCEDURES  Procedures   ED DIAGNOSES     ICD-10-CM   1. Suspected 2019 novel coronavirus infection  Z20.828   2. Acute bronchitis with bronchospasm  J20.9         [place], MD 02/12/19 [phone]

## 2019-02-12 NOTE — ED Triage Notes (Signed)
Pt BIB [facility] from [place]. Pt c/o of SHOB, fever and dry, non-productive cough x3 days. Pt was tested for COVID on 02/11/19 will get results on 02/13/19. There are positive cases of COVID in [place]. Pt fever with EMS was 102.0, 1g of Tylenol was administered. Pt becomes SHOB with exertion as well as during conversation. Pt was 94% on RA with EMS dropping down to 91% during ambulation. Pt c/o generalized body aches.

## 2019-02-13 ENCOUNTER — Other Ambulatory Visit: Payer: Self-pay

## 2019-02-13 ENCOUNTER — Emergency Department (HOSPITAL_COMMUNITY)
Admission: EM | Admit: 2019-02-13 | Discharge: 2019-02-13 | Disposition: A | Discharge status: Home / Self Care | Payer: Medicare Other | Source: Home / Self Care | Attending: Emergency Medicine | Admitting: Emergency Medicine

## 2019-02-13 ENCOUNTER — Encounter (HOSPITAL_COMMUNITY): Payer: Self-pay

## 2019-02-13 DIAGNOSIS — B349 Viral infection, unspecified: Secondary | ICD-10-CM

## 2019-02-13 DIAGNOSIS — R05 Cough: Secondary | ICD-10-CM

## 2019-02-13 DIAGNOSIS — E119 Type 2 diabetes mellitus without complications: Secondary | ICD-10-CM | POA: Insufficient documentation

## 2019-02-13 DIAGNOSIS — R059 Cough, unspecified: Secondary | ICD-10-CM

## 2019-02-13 DIAGNOSIS — I10 Essential (primary) hypertension: Secondary | ICD-10-CM | POA: Insufficient documentation

## 2019-02-13 DIAGNOSIS — U071 COVID-19: Secondary | ICD-10-CM | POA: Insufficient documentation

## 2019-02-13 DIAGNOSIS — Z96651 Presence of right artificial knee joint: Secondary | ICD-10-CM | POA: Insufficient documentation

## 2019-02-13 HISTORY — DX: Bronchitis, not specified as acute or chronic: J40

## 2019-02-13 LAB — INFLUENZA PANEL BY PCR (TYPE A & B)
Influenza A By PCR: NEGATIVE
Influenza B By PCR: NEGATIVE

## 2019-02-13 NOTE — ED Triage Notes (Signed)
Per EMS- Patient is from [facility]. Patient was diagnosed with bronchitis 4 days ago. Patient states she does not feel she is getting any better. Patient told EMS that she did not received Covid test when she was here 4 days ago and wants one today.

## 2019-02-13 NOTE — ED Notes (Signed)
Lindsey Baldwin   Medical POA -please call with updates/patient status changes

## 2019-02-13 NOTE — ED Provider Notes (Signed)
[facility] Emergency Department Provider Note MRN:  [id number]  Arrival date & time: 02/13/19     Chief Complaint   recheck of bronchitis   History of Present Illness   Lindsey Baldwin is a 73 y.o. year-old female with a history of hypertension, diabetes presenting to the ED with chief complaint of Hx of bronchitis.  Patient is having continuation of cough for the past 1 or 2 weeks.  Not getting better.  Soreness in the chest with cough.  Denies fever, no shortness of breath.  Denies abdominal pain.  No vomiting, no diarrhea.  Here for repeat evaluation.  Wants to be tested for Covid.  Review of Systems  A complete 10 system review of systems was obtained and all systems are negative except as noted in the HPI and PMH.   Patient's Health History    Past Medical History:  Diagnosis Date  . 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 Bronchitis   . Family history of adverse reaction to anesthesia    MOTHER-- PONV  . GERD (gastroesophageal reflux disease)   . Glaucoma, right eye    CLOSED ANGLE  . Hypertension   . OA (osteoarthritis)    KNEES , HANDS  . OAB (overactive bladder)   . OSA on CPAP   . Osteoporosis   . Type 2 diabetes mellitus ([place])    FOLLOWED BY PCP  . Wears glasses     Past Surgical History:  Procedure Laterality Date  . CYST EXCISION  08/2017   POSTERIOR NECK  . CYSTOSCOPY WITH RETROGRADE PYELOGRAM, URETEROSCOPY AND STENT PLACEMENT Left 06/16/2018   Procedure: diagnostic URETEROSCOPY AND STENT PLACEMENT;  Surgeon: Cleon Gustin, MD;  Location: [facility];  Service: Urology;  Laterality: Left;  1 HR  . EYE SURGERY Left 1992   REMOVE EYE AND PLACEMENT PROSTHESIS  . KNEE ARTHROPLASTY Right 12/12/2017   Procedure: RIGHT TOTAL KNEE ARTHROPLASTY WITH COMPUTER NAVIGATION;  Surgeon: Rod Can, MD;  Location: [facility];  Service: Orthopedics;  Laterality: Right;  Needs RNFA  . TONSILLECTOMY  AGE 64    Family History  Problem  Relation Age of Onset  . Cancer Mother   . Cancer Father   . Breast cancer Maternal Aunt     Social History   Socioeconomic History  . Marital status: Single    Spouse name: Not on file  . Number of children: Not on file  . Years of education: Not on file  . Highest education level: Not on file  Occupational History  . Not on file  Social Needs  . Financial resource strain: Not on file  . Food insecurity    Worry: Not on file    Inability: Not on file  . Transportation needs    Medical: Not on file    Non-medical: Not on file  Tobacco Use  . Smoking status: Never Smoker  . Smokeless tobacco: Never Used  Substance and Sexual Activity  . Alcohol use: No  . Drug use: Never  . Sexual activity: Yes    Birth control/protection: Post-menopausal  Lifestyle  . Physical activity    Days per week: Not on file    Minutes per session: Not on file  . Stress: Not on file  Relationships  . Social Herbalist on phone: Not on file    Gets together: Not on file    Attends religious service: Not on file    Active member  of club or organization: Not on file    Attends meetings of clubs or organizations: Not on file    Relationship status: Not on file  . Intimate partner violence    Fear of current or ex partner: Not on file    Emotionally abused: Not on file    Physically abused: Not on file    Forced sexual activity: Not on file  Other Topics Concern  . Not on file  Social History Narrative  . Not on file     Physical Exam  Vital Signs and Nursing Notes reviewed Vitals:   02/13/19 1930 02/13/19 2000  BP: (!) 172/82 (!) 169/121  Pulse: 97 88  Resp: 20 17  Temp: 99.9 F (37.7 C)   SpO2: 95% 94%    CONSTITUTIONAL: Well-appearing, NAD NEURO:  Alert and oriented x 3, no focal deficits EYES:  eyes equal and reactive ENT/NECK:  no LAD, no JVD CARDIO: Regular rate, well-perfused, normal S1 and S2 PULM:  CTAB no wheezing or rhonchi GI/GU:  normal bowel sounds,  non-distended, non-tender MSK/SPINE:  No gross deformities, no edema SKIN:  no rash, atraumatic PSYCH:  Appropriate speech and behavior  Diagnostic and Interventional Summary    EKG Interpretation  Date/Time:    Ventricular Rate:    PR Interval:    QRS Duration:   QT Interval:    QTC Calculation:   R Axis:     Text Interpretation:        Labs Reviewed  SARS CORONAVIRUS 2 (TAT 6-24 HRS)  INFLUENZA PANEL BY PCR (TYPE A & B)    No orders to display    Medications - No data to display   Procedures  /  Critical Care Procedures  ED Course and Medical Decision Making  I have reviewed the triage vital signs and the nursing notes.  Pertinent labs & imaging results that were available during my care of the patient were reviewed by me and considered in my medical decision making (see below for details).     Consistent with viral illness, chest x-ray yesterday is without evidence of pneumonia.  Considering COVID-19, will swab here.  Will also swab for flu.  Patient is without increased work of breathing, no hypoxia, discussed case with POA, patient does not have indication for further testing or admission and will be discharged.  Barth Kirks. Sedonia Small, [place] [email or web address]  Final Clinical Impressions(s) / ED Diagnoses     ICD-10-CM   1. Cough  R05   2. Viral illness  B34.9     ED Discharge Orders    None       Discharge Instructions Discussed with and Provided to Patient:     Discharge Instructions     You were evaluated in the Emergency Department and after careful evaluation, we did not find any emergent condition requiring admission or further testing in the hospital.  Your exam/testing today is overall reassuring.  We have tested you for the coronavirus here in the Emergency Department.  Please isolate or quarantine at home until you receive a negative test result.  If positive, we recommend continued home  quarantine per [facility] recommendations.   Please return to the Emergency Department if you experience any worsening of your condition.  We encourage you to follow up with a primary care provider.  Thank you for allowing [place] to be a part of your care.       Maudie Flakes, MD 02/13/19  2057  

## 2019-02-13 NOTE — Discharge Instructions (Addendum)
You were evaluated in the Emergency Department and after careful evaluation, we did not find any emergent condition requiring admission or further testing in the hospital.  Your exam/testing today is overall reassuring.  We have tested you for the coronavirus here in the Emergency Department.  Please isolate or quarantine at home until you receive a negative test result.  If positive, we recommend continued home quarantine per [facility] recommendations.   Please return to the Emergency Department if you experience any worsening of your condition.  We encourage you to follow up with a primary care provider.  Thank you for allowing [place] to be a part of your care.

## 2019-02-14 LAB — SARS CORONAVIRUS 2 (TAT 6-24 HRS): SARS Coronavirus 2: POSITIVE — AB

## 2019-02-15 ENCOUNTER — Other Ambulatory Visit: Payer: Self-pay

## 2019-02-15 ENCOUNTER — Emergency Department (HOSPITAL_COMMUNITY): Payer: Medicare Other

## 2019-02-15 ENCOUNTER — Inpatient Hospital Stay (HOSPITAL_COMMUNITY)
Admission: EM | Admit: 2019-02-15 | Discharge: 2019-03-13 | DRG: 177 | Disposition: A | Discharge status: Skilled Nursing Facility | Payer: Medicare Other | Source: Skilled Nursing Facility | Attending: Internal Medicine | Admitting: Internal Medicine

## 2019-02-15 DIAGNOSIS — Z7984 Long term (current) use of oral hypoglycemic drugs: Secondary | ICD-10-CM

## 2019-02-15 DIAGNOSIS — E785 Hyperlipidemia, unspecified: Secondary | ICD-10-CM | POA: Diagnosis present

## 2019-02-15 DIAGNOSIS — E1169 Type 2 diabetes mellitus with other specified complication: Secondary | ICD-10-CM

## 2019-02-15 DIAGNOSIS — J1289 Other viral pneumonia: Secondary | ICD-10-CM | POA: Diagnosis present

## 2019-02-15 DIAGNOSIS — M1712 Unilateral primary osteoarthritis, left knee: Secondary | ICD-10-CM | POA: Diagnosis present

## 2019-02-15 DIAGNOSIS — Z9989 Dependence on other enabling machines and devices: Secondary | ICD-10-CM | POA: Diagnosis not present

## 2019-02-15 DIAGNOSIS — R7989 Other specified abnormal findings of blood chemistry: Secondary | ICD-10-CM | POA: Diagnosis not present

## 2019-02-15 DIAGNOSIS — Z880 Allergy status to penicillin: Secondary | ICD-10-CM

## 2019-02-15 DIAGNOSIS — Z791 Long term (current) use of non-steroidal anti-inflammatories (NSAID): Secondary | ICD-10-CM

## 2019-02-15 DIAGNOSIS — R0902 Hypoxemia: Secondary | ICD-10-CM | POA: Diagnosis present

## 2019-02-15 DIAGNOSIS — J9601 Acute respiratory failure with hypoxia: Secondary | ICD-10-CM | POA: Diagnosis present

## 2019-02-15 DIAGNOSIS — M810 Age-related osteoporosis without current pathological fracture: Secondary | ICD-10-CM | POA: Diagnosis present

## 2019-02-15 DIAGNOSIS — H409 Unspecified glaucoma: Secondary | ICD-10-CM | POA: Diagnosis present

## 2019-02-15 DIAGNOSIS — J159 Unspecified bacterial pneumonia: Secondary | ICD-10-CM | POA: Diagnosis present

## 2019-02-15 DIAGNOSIS — E669 Obesity, unspecified: Secondary | ICD-10-CM | POA: Diagnosis present

## 2019-02-15 DIAGNOSIS — G4733 Obstructive sleep apnea (adult) (pediatric): Secondary | ICD-10-CM | POA: Diagnosis present

## 2019-02-15 DIAGNOSIS — U071 COVID-19: Principal | ICD-10-CM | POA: Diagnosis present

## 2019-02-15 DIAGNOSIS — Y92239 Unspecified place in hospital as the place of occurrence of the external cause: Secondary | ICD-10-CM | POA: Diagnosis present

## 2019-02-15 DIAGNOSIS — K219 Gastro-esophageal reflux disease without esophagitis: Secondary | ICD-10-CM | POA: Diagnosis present

## 2019-02-15 DIAGNOSIS — E119 Type 2 diabetes mellitus without complications: Secondary | ICD-10-CM | POA: Diagnosis not present

## 2019-02-15 DIAGNOSIS — J9600 Acute respiratory failure, unspecified whether with hypoxia or hypercapnia: Secondary | ICD-10-CM | POA: Diagnosis present

## 2019-02-15 DIAGNOSIS — J069 Acute upper respiratory infection, unspecified: Secondary | ICD-10-CM | POA: Diagnosis not present

## 2019-02-15 DIAGNOSIS — N3281 Overactive bladder: Secondary | ICD-10-CM | POA: Diagnosis present

## 2019-02-15 DIAGNOSIS — E781 Pure hyperglyceridemia: Secondary | ICD-10-CM | POA: Diagnosis not present

## 2019-02-15 DIAGNOSIS — E1165 Type 2 diabetes mellitus with hyperglycemia: Secondary | ICD-10-CM | POA: Diagnosis not present

## 2019-02-15 DIAGNOSIS — J1282 Pneumonia due to coronavirus disease 2019: Secondary | ICD-10-CM | POA: Diagnosis present

## 2019-02-15 DIAGNOSIS — Z96651 Presence of right artificial knee joint: Secondary | ICD-10-CM | POA: Diagnosis present

## 2019-02-15 DIAGNOSIS — R000 Tachycardia, unspecified: Secondary | ICD-10-CM | POA: Diagnosis not present

## 2019-02-15 DIAGNOSIS — Z803 Family history of malignant neoplasm of breast: Secondary | ICD-10-CM | POA: Diagnosis not present

## 2019-02-15 DIAGNOSIS — Z882 Allergy status to sulfonamides status: Secondary | ICD-10-CM | POA: Diagnosis not present

## 2019-02-15 DIAGNOSIS — I959 Hypotension, unspecified: Secondary | ICD-10-CM | POA: Diagnosis not present

## 2019-02-15 DIAGNOSIS — Z09 Encounter for follow-up examination after completed treatment for conditions other than malignant neoplasm: Secondary | ICD-10-CM | POA: Diagnosis not present

## 2019-02-15 DIAGNOSIS — E876 Hypokalemia: Secondary | ICD-10-CM | POA: Diagnosis not present

## 2019-02-15 DIAGNOSIS — Z7982 Long term (current) use of aspirin: Secondary | ICD-10-CM | POA: Diagnosis not present

## 2019-02-15 DIAGNOSIS — H5462 Unqualified visual loss, left eye, normal vision right eye: Secondary | ICD-10-CM | POA: Diagnosis present

## 2019-02-15 DIAGNOSIS — T502X5A Adverse effect of carbonic-anhydrase inhibitors, benzothiadiazides and other diuretics, initial encounter: Secondary | ICD-10-CM | POA: Diagnosis not present

## 2019-02-15 DIAGNOSIS — Z79899 Other long term (current) drug therapy: Secondary | ICD-10-CM

## 2019-02-15 DIAGNOSIS — T380X5A Adverse effect of glucocorticoids and synthetic analogues, initial encounter: Secondary | ICD-10-CM | POA: Diagnosis not present

## 2019-02-15 DIAGNOSIS — Z6830 Body mass index (BMI) 30.0-30.9, adult: Secondary | ICD-10-CM

## 2019-02-15 DIAGNOSIS — I10 Essential (primary) hypertension: Secondary | ICD-10-CM | POA: Diagnosis present

## 2019-02-15 LAB — CBC WITH DIFFERENTIAL/PLATELET
Abs Immature Granulocytes: 0.02 10*3/uL (ref 0.00–0.07)
Basophils Absolute: 0 10*3/uL (ref 0.0–0.1)
Basophils Relative: 0 %
Eosinophils Absolute: 0 10*3/uL (ref 0.0–0.5)
Eosinophils Relative: 0 %
HCT: 42.8 % (ref 36.0–46.0)
Hemoglobin: 13.5 g/dL (ref 12.0–15.0)
Immature Granulocytes: 0 %
Lymphocytes Relative: 25 %
Lymphs Abs: 1.2 10*3/uL (ref 0.7–4.0)
MCH: 29.7 pg (ref 26.0–34.0)
MCHC: 31.5 g/dL (ref 30.0–36.0)
MCV: 94.1 fL (ref 80.0–100.0)
Monocytes Absolute: 0.3 10*3/uL (ref 0.1–1.0)
Monocytes Relative: 7 %
Neutro Abs: 3.1 10*3/uL (ref 1.7–7.7)
Neutrophils Relative %: 68 %
Platelets: 132 10*3/uL — ABNORMAL LOW (ref 150–400)
RBC: 4.55 MIL/uL (ref 3.87–5.11)
RDW: 13.8 % (ref 11.5–15.5)
WBC: 4.6 10*3/uL (ref 4.0–10.5)
nRBC: 0 % (ref 0.0–0.2)

## 2019-02-15 LAB — COMPREHENSIVE METABOLIC PANEL
ALT: 58 U/L — ABNORMAL HIGH (ref 0–44)
AST: 93 U/L — ABNORMAL HIGH (ref 15–41)
Albumin: 3.7 g/dL (ref 3.5–5.0)
Alkaline Phosphatase: 39 U/L (ref 38–126)
Anion gap: 9 (ref 5–15)
BUN: 26 mg/dL — ABNORMAL HIGH (ref 8–23)
CO2: 24 mmol/L (ref 22–32)
Calcium: 8.7 mg/dL — ABNORMAL LOW (ref 8.9–10.3)
Chloride: 106 mmol/L (ref 98–111)
Creatinine, Ser: 0.98 mg/dL (ref 0.44–1.00)
GFR calc Af Amer: >60 mL/min (ref >60)
GFR calc non Af Amer: 58 mL/min — ABNORMAL LOW (ref >60)
Glucose, Bld: 157 mg/dL — ABNORMAL HIGH (ref 70–99)
Potassium: 4.3 mmol/L (ref 3.5–5.1)
Sodium: 139 mmol/L (ref 135–145)
Total Bilirubin: 0.6 mg/dL (ref 0.3–1.2)
Total Protein: 7.2 g/dL (ref 6.5–8.1)

## 2019-02-15 LAB — LACTIC ACID, PLASMA
Lactic Acid, Venous: 1.5 mmol/L (ref 0.5–1.9)
Lactic Acid, Venous: 1.5 mmol/L (ref 0.5–1.9)

## 2019-02-15 LAB — D-DIMER, QUANTITATIVE: D-Dimer, Quant: 1.09 ug/mL-FEU — ABNORMAL HIGH (ref 0.00–0.50)

## 2019-02-15 LAB — TROPONIN I (HIGH SENSITIVITY): Troponin I (High Sensitivity): 14 ng/L (ref <18)

## 2019-02-15 LAB — TRIGLYCERIDES: Triglycerides: 163 mg/dL — ABNORMAL HIGH (ref <150)

## 2019-02-15 LAB — LACTATE DEHYDROGENASE: LDH: 335 U/L — ABNORMAL HIGH (ref 98–192)

## 2019-02-15 LAB — C-REACTIVE PROTEIN: CRP: 9.9 mg/dL — ABNORMAL HIGH (ref <1.0)

## 2019-02-15 LAB — FIBRINOGEN: Fibrinogen: 604 mg/dL — ABNORMAL HIGH (ref 210–475)

## 2019-02-15 LAB — FERRITIN: Ferritin: 720 ng/mL — ABNORMAL HIGH (ref 11–307)

## 2019-02-15 LAB — PROCALCITONIN: Procalcitonin: 0.12 ng/mL

## 2019-02-15 MED ORDER — DEXAMETHASONE SODIUM PHOSPHATE 10 MG/ML IJ SOLN
6.0000 mg | INTRAMUSCULAR | Status: DC
  Administered 2019-02-16: 6 mg via INTRAVENOUS
  Filled 2019-02-15: qty 1

## 2019-02-15 MED ORDER — INSULIN ASPART 100 UNIT/ML ~~LOC~~ SOLN
0.0000 [IU] | Freq: Three times a day (TID) | SUBCUTANEOUS | Status: DC
  Administered 2019-02-16: 13:00:00 3 [IU] via SUBCUTANEOUS
  Administered 2019-02-16: 2 [IU] via SUBCUTANEOUS
  Administered 2019-02-16: 16:00:00 7 [IU] via SUBCUTANEOUS
  Administered 2019-02-17 (×2): 5 [IU] via SUBCUTANEOUS
  Administered 2019-02-17: 16:00:00 9 [IU] via SUBCUTANEOUS
  Administered 2019-02-18: 5 [IU] via SUBCUTANEOUS
  Administered 2019-02-18: 2 [IU] via SUBCUTANEOUS
  Administered 2019-02-18: 5 [IU] via SUBCUTANEOUS
  Administered 2019-02-19: 2 [IU] via SUBCUTANEOUS
  Administered 2019-02-19: 3 [IU] via SUBCUTANEOUS

## 2019-02-15 MED ORDER — ENOXAPARIN SODIUM 40 MG/0.4ML ~~LOC~~ SOLN
40.0000 mg | SUBCUTANEOUS | Status: DC
  Administered 2019-02-16 – 2019-02-21 (×7): 40 mg via SUBCUTANEOUS
  Filled 2019-02-15 (×7): qty 0.4

## 2019-02-15 MED ORDER — TIMOLOL MALEATE 0.5 % OP SOLN
1.0000 [drp] | Freq: Two times a day (BID) | OPHTHALMIC | Status: DC
  Administered 2019-02-16 – 2019-03-13 (×51): 1 [drp] via OPHTHALMIC
  Filled 2019-02-15 (×2): qty 5

## 2019-02-15 MED ORDER — ACETAMINOPHEN 325 MG PO TABS
650.0000 mg | ORAL_TABLET | Freq: Once | ORAL | Status: AC
  Administered 2019-02-15: 650 mg via ORAL
  Filled 2019-02-15: qty 2

## 2019-02-15 MED ORDER — GUAIFENESIN-DM 100-10 MG/5ML PO SYRP
10.0000 mL | ORAL_SOLUTION | ORAL | Status: DC | PRN
  Administered 2019-02-16 – 2019-03-05 (×10): 10 mL via ORAL
  Filled 2019-02-15 (×11): qty 10

## 2019-02-15 MED ORDER — SODIUM CHLORIDE 0.9 % IV SOLN: 250.0000 mL | INTRAVENOUS | Status: DC | PRN

## 2019-02-15 MED ORDER — ONDANSETRON HCL 4 MG PO TABS: 4.0000 mg | ORAL_TABLET | Freq: Four times a day (QID) | ORAL | Status: DC | PRN

## 2019-02-15 MED ORDER — INSULIN ASPART 100 UNIT/ML ~~LOC~~ SOLN
0.0000 [IU] | Freq: Every day | SUBCUTANEOUS | Status: DC
  Administered 2019-02-16: 2 [IU] via SUBCUTANEOUS
  Administered 2019-02-17: 3 [IU] via SUBCUTANEOUS
  Administered 2019-02-18: 21:00:00 2 [IU] via SUBCUTANEOUS

## 2019-02-15 MED ORDER — LATANOPROST 0.005 % OP SOLN
1.0000 [drp] | Freq: Every day | OPHTHALMIC | Status: DC
  Administered 2019-02-16 – 2019-03-12 (×26): 1 [drp] via OPHTHALMIC
  Filled 2019-02-15: qty 2.5

## 2019-02-15 MED ORDER — SODIUM CHLORIDE 0.9% FLUSH
3.0000 mL | Freq: Two times a day (BID) | INTRAVENOUS | Status: DC
  Administered 2019-02-16 – 2019-03-12 (×29): 3 mL via INTRAVENOUS

## 2019-02-15 MED ORDER — HYDROCODONE-ACETAMINOPHEN 5-325 MG PO TABS: 1.0000 | ORAL_TABLET | Freq: Four times a day (QID) | ORAL | Status: DC | PRN

## 2019-02-15 MED ORDER — SODIUM CHLORIDE 0.9% FLUSH
3.0000 mL | Freq: Two times a day (BID) | INTRAVENOUS | Status: DC
  Administered 2019-02-16 – 2019-03-04 (×28): 3 mL via INTRAVENOUS

## 2019-02-15 MED ORDER — APRACLONIDINE HCL 0.5 % OP SOLN
1.0000 [drp] | Freq: Two times a day (BID) | OPHTHALMIC | Status: DC
  Administered 2019-02-16 – 2019-03-13 (×51): 1 [drp] via OPHTHALMIC
  Filled 2019-02-15: qty 5

## 2019-02-15 MED ORDER — FUROSEMIDE 20 MG PO TABS
20.0000 mg | ORAL_TABLET | Freq: Every day | ORAL | Status: DC
  Administered 2019-02-16 – 2019-02-19 (×4): 20 mg via ORAL
  Filled 2019-02-15 (×3): qty 1
  Filled 2019-02-15: qty 0.5
  Filled 2019-02-15: qty 1

## 2019-02-15 MED ORDER — SODIUM CHLORIDE 0.9 % IV SOLN
200.0000 mg | Freq: Once | INTRAVENOUS | Status: AC
  Administered 2019-02-15: 200 mg via INTRAVENOUS
  Filled 2019-02-15: qty 40

## 2019-02-15 MED ORDER — SODIUM CHLORIDE 0.9% FLUSH: 3.0000 mL | INTRAVENOUS | Status: DC | PRN

## 2019-02-15 MED ORDER — ATORVASTATIN CALCIUM 40 MG PO TABS
40.0000 mg | ORAL_TABLET | Freq: Every evening | ORAL | Status: DC
  Administered 2019-02-16 – 2019-03-12 (×25): 40 mg via ORAL
  Filled 2019-02-15 (×25): qty 1

## 2019-02-15 MED ORDER — MIRABEGRON ER 50 MG PO TB24
50.0000 mg | ORAL_TABLET | Freq: Every day | ORAL | Status: DC
  Administered 2019-02-16 – 2019-03-13 (×26): 50 mg via ORAL
  Filled 2019-02-15 (×25): qty 1
  Filled 2019-02-15: qty 2
  Filled 2019-02-15: qty 1

## 2019-02-15 MED ORDER — ONDANSETRON HCL 4 MG/2ML IJ SOLN
4.0000 mg | Freq: Four times a day (QID) | INTRAMUSCULAR | Status: DC | PRN
  Administered 2019-02-16 – 2019-02-19 (×2): 4 mg via INTRAVENOUS
  Filled 2019-02-15 (×2): qty 2

## 2019-02-15 MED ORDER — ACETAMINOPHEN 325 MG PO TABS
650.0000 mg | ORAL_TABLET | Freq: Four times a day (QID) | ORAL | Status: DC | PRN
  Administered 2019-02-16 – 2019-02-27 (×3): 650 mg via ORAL
  Filled 2019-02-15 (×3): qty 2

## 2019-02-15 MED ORDER — SODIUM CHLORIDE 0.9 % IV SOLN: 100.0000 mg | INTRAVENOUS | Status: DC

## 2019-02-15 MED ORDER — ENALAPRIL MALEATE 2.5 MG PO TABS
2.5000 mg | ORAL_TABLET | Freq: Every day | ORAL | Status: DC
  Administered 2019-02-16 – 2019-03-05 (×18): 2.5 mg via ORAL
  Filled 2019-02-15 (×19): qty 1

## 2019-02-15 MED ORDER — ASPIRIN 81 MG PO CHEW
81.0000 mg | CHEWABLE_TABLET | Freq: Two times a day (BID) | ORAL | Status: DC
  Administered 2019-02-16 – 2019-03-13 (×51): 81 mg via ORAL
  Filled 2019-02-15 (×52): qty 1

## 2019-02-15 NOTE — ED Triage Notes (Signed)
73 yo female BIB by [facility] from [place] [facility]. Pt seen here Wednesday and Friday of last week. Pt tested positive for COVID on Friday. Pt's POA called EMS because the patient is having increase SOB.  On arrival pt 86% on ROoom air 95% on non rebreather. AOx4 as EMS.   Vitals: bp 140-76 Hr 80 rr 22 Temp 98.1 (pt has been running fever at home, took tylenol at noon today) cbg 169

## 2019-02-15 NOTE — ED Notes (Signed)
Attempted to call report twice to patients receiving nurse at [facility] and ive been unsuccessful. Will continue to try

## 2019-02-15 NOTE — H&P (Signed)
Lindsey Baldwin [id number] DOB: [birth year] DOA: 02/15/2019     PCP: Donald Prose, MD   Outpatient Specialists:     Urology Dr.  Alyson Ingles  Patient arrived to ER on 02/15/19 at 1508  Patient coming from: home   From facility  [facility]  Chief Complaint:   Chief Complaint  Patient presents with  . COVID +  . Shortness of Breath    HPI: Lindsey Baldwin is a 73 y.o. female with medical history significant of hypertension, diabetes, GERD,OSA on CPAP   Presented with  1 week history of shortness of breath patient was seen in emergency department  5 days ago and then again 3 days ago at that time she was diagnosed with Covid was initially able to be discharged back to assisted living today presents with worsening shortness of breath now hypoxic on arrival 86% room air improved to 95% nonrebreather.  Has been running fevers at assisted living and took some Tylenol with some improvement.   Infectious risk factors:  Reports  fever, shortness of breath, dry cough, chest pain,      ER   COVID TEST POSITIVE,   Lab Results  Component Value Date   SARSCOV2NAA POSITIVE (A) 02/13/2019   [place] Not Detected 01/31/2019    Regarding pertinent Chronic problems:     Hyperlipidemia -  on statins Lipitor   HTN on Enapril    DM 2 -  Lab Results  Component Value Date   HGBA1C 6.9 (H) 06/12/2018    PO meds only,       obesity-   BMI Readings from Last 1 Encounters:  02/13/19 31.09 kg/m       OSA -on  CPAP    While in ER: To have hypoxia requiring nonrebreather elevated inflammatory markers    the following Work up has been ordered so far:  Orders Placed This Encounter  Procedures  . Critical Care  . Blood Culture (routine x 2)  . DG Chest Port 1 View  . Lactic acid, plasma  . CBC WITH DIFFERENTIAL  . Comprehensive metabolic panel  . D-dimer, quantitative  . Procalcitonin  . Lactate dehydrogenase  . Ferritin  . Triglycerides  . Fibrinogen   . C-reactive protein  . Diet NPO time specified  . Cardiac monitoring  . Insert peripheral IV x 2  . Initiate Carrier Fluid Protocol  . Place surgical mask on patient  . Patient to wear surgical mask during transportation  . Assess patient for ability to self-prone. If able (can move self in bed, ambulate) and stable (SpO2 and oxygen requirement):  . RN/NT - Document specific oxygen requirements in CHL  . Notify EDP if new oxygen requirements escalates > 4L per minute [place]  . RN to draw the following extra tubes:  . Consult to hospitalist  ALL PATIENTS BEING ADMITTED/HAVING PROCEDURES NEED COVID-19 SCREENING  . Airborne and Contact precautions  . Pulse oximetry, continuous  . ED EKG 12-Lead  . EKG 12-Lead     Following Medications were ordered in ER: Medications - No data to display      Consult Orders  (From admission, onward)         Start     Ordered   02/15/19 1815  Consult to hospitalist  ALL PATIENTS BEING ADMITTED/HAVING PROCEDURES NEED COVID-19 SCREENING  Once    Comments: ALL PATIENTS BEING ADMITTED/HAVING PROCEDURES NEED COVID-19 SCREENING  Provider:  (Not yet assigned)  Question Answer Comment  Place call  to: [facility]   Reason for Consult Admit      02/15/19 1814           Significant initial  Findings: Abnormal Labs Reviewed  CBC WITH DIFFERENTIAL/PLATELET - Abnormal; Notable for the following components:      Result Value   Platelets 132 (*)    All other components within normal limits  COMPREHENSIVE METABOLIC PANEL - Abnormal; Notable for the following components:   Glucose, Bld 157 (*)    BUN 26 (*)    Calcium 8.7 (*)    AST 93 (*)    ALT 58 (*)    GFR calc non Af Amer 58 (*)    All other components within normal limits  LACTATE DEHYDROGENASE - Abnormal; Notable for the following components:   LDH 335 (*)    All other components within normal limits  FERRITIN - Abnormal; Notable for the following components:   Ferritin 720 (*)    All  other components within normal limits  TRIGLYCERIDES - Abnormal; Notable for the following components:   Triglycerides 163 (*)    All other components within normal limits  C-REACTIVE PROTEIN - Abnormal; Notable for the following components:   CRP 9.9 (*)    All other components within normal limits    Otherwise labs showing:    Recent Labs  Lab 02/12/19 0124 02/15/19 1726  NA 137 139  K 4.2 4.3  CO2 21* 24  GLUCOSE 200* 157*  BUN 19 26*  CREATININE 1.00 0.98  CALCIUM 9.0 8.7*    Cr    stable,    Lab Results  Component Value Date   CREATININE 0.98 02/15/2019   CREATININE 1.00 02/12/2019   CREATININE 0.73 06/12/2018    Recent Labs  Lab 02/15/19 1726  AST 93*  ALT 58*  ALKPHOS 39  BILITOT 0.6  PROT 7.2  ALBUMIN 3.7   Lab Results  Component Value Date   CALCIUM 8.7 (L) 02/15/2019     WBC      Component Value Date/Time   WBC 4.6 02/15/2019 1726   ANC    Component Value Date/Time   NEUTROABS 3.1 02/15/2019 1726   ALC No components found for: LYMPHAB    Plt: Lab Results  Component Value Date   PLT 132 (L) 02/15/2019     Lactic Acid, Venous    Component Value Date/Time   LATICACIDVEN 1.5 02/15/2019 1728    Procalcitonin 0.12   COVID-19 Labs  Recent Labs    02/15/19 1726 02/15/19 1728  FERRITIN  --  720*  LDH 335*  --   CRP  --  9.9*    Lab Results  Component Value Date   SARSCOV2NAA POSITIVE (A) 02/13/2019   [facility] Not Detected 01/31/2019    HG/HCT stable,      Component Value Date/Time   HGB 13.5 02/15/2019 1726   HCT 42.8 02/15/2019 1726     Troponin  ordered     ECG: Ordered Personally reviewed by me showing: HR : 82  Rhythm:  NSR   no evidence of ischemic changes QTC 434    DM  labs:  HbA1C: Recent Labs    06/12/18 1417  HGBA1C 6.9*     CBG (last 3)  No results for input(s): GLUCAP in the last 72 hours.     UA  not ordered     Ordered    CXR - development of bilateral airspace disease      ED Triage Vitals  Enc Vitals Group     BP 02/15/19 1544 (!) 152/70     Pulse Rate 02/15/19 1544 86     Resp 02/15/19 1544 (!) 26     Temp 02/15/19 1544 98.4 F (36.9 C)     Temp Source 02/15/19 1544 Oral     SpO2 02/15/19 1544 98 %     Weight --      Height --      Head Circumference --      Peak Flow --      Pain Score 02/15/19 1546 0     Pain Loc --      Pain Edu? --      Excl. in [place]? --   TMAX(24)@       Latest  Blood pressure (!) 152/70, pulse 86, temperature 98.4 F (36.9 C), temperature source Oral, resp. rate (!) 26, SpO2 98 %.    Hospitalist was called for admission for Covid PNA   Review of Systems:    Pertinent positives include:  Fevers, chills, fatigue,  shortness of breath at rest dyspnea on exertion, non-productive cough Constitutional:  No weight loss, night sweats weight loss  HEENT:  No headaches, Difficulty swallowing,Tooth/dental problems,Sore throat,  No sneezing, itching, ear ache, nasal congestion, post nasal drip,  Cardio-vascular:  No chest pain, Orthopnea, PND, anasarca, dizziness, palpitations.no Bilateral lower extremity swelling  GI:  No heartburn, indigestion, abdominal pain, nausea, vomiting, diarrhea, change in bowel habits, loss of appetite, melena, blood in stool, hematemesis Resp:  no No excess mucus, no productive cough, No , No coughing up of blood.No change in color of mucus.No wheezing. Skin:  no rash or lesions. No jaundice GU:  no dysuria, change in color of urine, no urgency or frequency. No straining to urinate.  No flank pain.  Musculoskeletal:  No joint pain or no joint swelling. No decreased range of motion. No back pain.  Psych:  No change in mood or affect. No depression or anxiety. No memory loss.  Neuro: no localizing neurological complaints, no tingling, no weakness, no double vision, no gait abnormality, no slurred speech, no confusion  All systems reviewed and apart from [place] all are negative  Past Medical  History:   Past Medical History:  Diagnosis Date  . 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 Bronchitis   . Family history of adverse reaction to anesthesia    MOTHER-- PONV  . GERD (gastroesophageal reflux disease)   . Glaucoma, right eye    CLOSED ANGLE  . Hypertension   . OA (osteoarthritis)    KNEES , HANDS  . OAB (overactive bladder)   . OSA on CPAP   . Osteoporosis   . Type 2 diabetes mellitus ([place])    FOLLOWED BY PCP  . Wears glasses        Past Surgical History:  Procedure Laterality Date  . CYST EXCISION  08/2017   POSTERIOR NECK  . CYSTOSCOPY WITH RETROGRADE PYELOGRAM, URETEROSCOPY AND STENT PLACEMENT Left 06/16/2018   Procedure: diagnostic URETEROSCOPY AND STENT PLACEMENT;  Surgeon: Cleon Gustin, MD;  Location: [facility];  Service: Urology;  Laterality: Left;  1 HR  . EYE SURGERY Left 1992   REMOVE EYE AND PLACEMENT PROSTHESIS  . KNEE ARTHROPLASTY Right 12/12/2017   Procedure: RIGHT TOTAL KNEE ARTHROPLASTY WITH COMPUTER NAVIGATION;  Surgeon: Rod Can, MD;  Location: [facility];  Service: Orthopedics;  Laterality: Right;  Needs RNFA  . TONSILLECTOMY  AGE 81  Social History:  Ambulatory  cane,       reports that she has never smoked. She has never used smokeless tobacco. She reports that she does not drink alcohol or use drugs.    Family History:   Family History  Problem Relation Age of Onset  . Cancer Mother   . Cancer Father   . Breast cancer Maternal Aunt     Allergies: Allergies  Allergen Reactions  . Penicillins Swelling and Other (See Comments)    Has patient had a PCN reaction causing immediate rash, facial/tongue/throat swelling, SOB or lightheadedness with hypotension: YES Has patient had a PCN reaction causing severe rash involving mucus membranes or skin necrosis: NO Has patient had a PCN reaction that required hospitalization: NO Has patient had a PCN reaction occurring within the last 10 years: NO If all of the above answers  are "NO", then may proceed with Cephalosporin use.   . Sulfa Antibiotics Rash     Prior to Admission medications   Medication Sig Start Date End Date Taking? Authorizing Provider  acetaminophen (TYLENOL) 500 MG tablet Take 500 mg by mouth every 6 (six) hours as needed (for pain.).   Yes [provider]  apraclonidine (IOPIDINE) 0.5 % ophthalmic solution Place 1 drop into the right eye 2 (two) times daily. 11/21/17  Yes [provider]  aspirin 81 MG chewable tablet Chew 1 tablet (81 mg total) by mouth 2 (two) times daily. 12/13/17  Yes Swinteck, Aaron Edelman, MD  atorvastatin (LIPITOR) 40 MG tablet Take 40 mg by mouth every evening.    Yes [provider]  Calcium Carb-Cholecalciferol (CALCIUM + D3 PO) Take 1 tablet by mouth daily.   Yes [provider]  calcium carbonate (TUMS - DOSED IN MG ELEMENTAL CALCIUM) 500 MG chewable tablet Chew 1 tablet by mouth 4 (four) times daily as needed for indigestion or heartburn.    Yes [provider]  enalapril (VASOTEC) 2.5 MG tablet Take 2.5 mg by mouth daily.    Yes [provider]  furosemide (LASIX) 20 MG tablet Take 20 mg by mouth daily.    Yes [provider]  HYDROcodone-acetaminophen (NORCO) 5-325 MG tablet Take 1 tablet by mouth every 6 (six) hours as needed for severe pain. 06/16/18  Yes McKenzie, Candee Furbish, MD  ibuprofen (ADVIL,MOTRIN) 200 MG tablet Take 200 mg by mouth every 8 (eight) hours as needed (pain.).   Yes [provider]  latanoprost (XALATAN) 0.005 % ophthalmic solution Place 1 drop into the right eye at bedtime.   Yes [provider]  metFORMIN (GLUCOPHAGE-XR) 500 MG 24 hr tablet Take 1,000 mg by mouth daily. 04/21/18  Yes [provider]  mirabegron ER (MYRBETRIQ) 50 MG TB24 tablet Take 50 mg by mouth daily.    Yes [provider]  Multiple Vitamins-Minerals (MULTIVITAMIN WITH MINERALS) tablet Take 1 tablet by mouth daily.   Yes [provider]  ondansetron (ZOFRAN) 8 MG tablet Take 8 mg by mouth 3 (three) times daily as needed for nausea.  12/30/18  Yes [provider]  sitaGLIPtin (JANUVIA) 100 MG tablet Take 100 mg by mouth every morning.   Yes [provider]  tamsulosin (FLOMAX) 0.4 MG CAPS capsule Take 0.4 mg by mouth daily. 12/30/18  Yes [provider]  timolol (BETIMOL) 0.5 % ophthalmic solution Place 1 drop into the right eye 2 (two) times daily.   Yes [provider]   Physical Exam: Blood pressure (!) 152/70, pulse 86,  temperature 98.4 F (36.9 C), temperature source Oral, resp. rate (!) 26, SpO2 98 %. 1. General:  in No  Acute distress    Chronically ill   -appearing 2. Psychological: Alert and  Oriented 3. Head/ENT:   Dry Mucous Membranes                          Head Non traumatic, neck supple                            Poor Dentition 4. SKIN:   decreased Skin turgor,  Skin clean Dry and intact no rash 5. Heart: Regular rate and rhythm no Murmur, no Rub or gallop 6. Lungs:  no wheezes or crackles   7. Abdomen: Soft,  non-tender, Non distended   Obese bowel sounds present 8. Lower extremities: no clubbing, cyanosis, no edema 9. Neurologically Grossly intact, moving all 4 extremities equally   10. MSK: Normal range of motion   All other LABS:     Recent Labs  Lab 02/12/19 0124 02/15/19 1726  WBC 5.6 4.6  NEUTROABS 4.0 3.1  HGB 13.2 13.5  HCT 40.2 42.8  MCV 93.1 94.1  PLT 127* 132*     Recent Labs  Lab 02/12/19 0124 02/15/19 1726  NA 137 139  K 4.2 4.3  CL 105 106  CO2 21* 24  GLUCOSE 200* 157*  BUN 19 26*  CREATININE 1.00 0.98  CALCIUM 9.0 8.7*     Recent Labs  Lab 02/15/19 1726  AST 93*  ALT 58*  ALKPHOS 39  BILITOT 0.6  PROT 7.2  ALBUMIN 3.7       Cultures: No results found for: SDES, [place], CULT, REPTSTATUS   Radiological Exams on Admission: Dg Chest Port 1 View  Result Date: 02/15/2019 CLINICAL DATA:  Short of  breath chest pain COVID EXAM: PORTABLE CHEST 1 VIEW COMPARISON:  02/12/2019 FINDINGS: Interim development of patchy airspace disease within the bilateral mid to lower lung zones. No pleural effusion. Stable cardiomediastinal silhouette. No pneumothorax. Age indeterminate suspected fracture deformity at the proximal right humerus. IMPRESSION: 1. Interim development of bilateral airspace disease in the mid to lower lungs compatible with multifocal pneumonia 2. Age indeterminate deformity of the proximal right humerus. Dedicated right shoulder radiographs may be obtained if clinically warranted. Electronically Signed   By: Donavan Foil M.D.   On: 02/15/2019 16:47    Chart has been reviewed    Assessment/Plan   73 y.o. female with medical history significant of hypertension, diabetes  ,GERD,OSA on CPAP Admitted for COVID  PNA  Present on Admission: . Pneumonia due to COVID-19 virus -AL  WITH KNOWN HX OF COVID19 ER Novel Corona Virus testing:  Ordered 02/13/19 and was  positive    - As per hx pt with evidence of fever Cough congestion  shortness of breath  - new severe hypoxia   Following concerning LAB/ imaging findings:  CBC:      LFTs: increased AST/ALT/Tbili   CRP, LDH: increased   IL-6 and Ferritin increased  Procalcitonin: low   CXR: hazy bilateral peripheral opacities or otherwise abnormal       -Following work-up initiated:        sputum cultures  Ordered 02/15/19, Blood cultures  Ordered 02/15/19,   Following complications noted:   will be monitoring for evidence of other complications such as PE/DVT CVA or  cardiovascular events  Plan of treatment: -  Transfer to [facility] facility if positive and beds are available     -given Hypoxia and /or infiltrates initiate steroids Decadron 6mg  q 24 hours And pharmacy consult for remdesivir - Will follow daily d.dimer - Assess for ability to prone  - Supportive management -Fluid sparing resuscitation  -Provide oxygen as  needed currently on  SpO2: 97 % O2 Flow Rate (L/min): 6 L/min - IF d.dimer elvated >5 will increase dose of lovenox    Poor Prognostic factors  73 y.o.  Personal hx of  DM2,   HTN, obesity  Evidence of  organ damage  Present  respiratory failure requiring >4L [place]  tachypnea,     Will order Airborne and Contact precautions  Discussed with [place] MD on CALL   . Acute respiratory failure due to COVID-19 [facility]) continue provide oxygenation as needed  Evaluate for ability to self prone  . Essential hypertension -chronic stable continue home medications  . Hyperlipemia -chronic stable continue home medications  OSA- hold off on CPAP DM2-  - Order Sensitive  SSI     -  check TSH and HgA1C  - Hold by mouth medications    Other plan as per orders.  DVT prophylaxis:   Lovenox     Code Status:  FULL CODE  as per patient  I had personally discussed CODE STATUS with patient   Family Communication:   Family not at  Bedside   Disposition Plan:                              Back to current facility when stable                              Consults called: none  Admission status:  ED Disposition    ED Disposition Condition Comment   Admit  The patient appears reasonably stabilized for admission considering the current resources, flow, and capabilities available in the ED at this time, and I doubt any other [facility] requiring further screening and/or treatment in the ED prior to admission is  present.         inpatient     Expect 2 midnight stay secondary to severity of patient's current illness including   hemodynamic instability despite optimal treatment  (tachypnea  Hypoxia )  Severe lab/radiological/exam abnormalities including:  Multifocal pNA  and extensive comorbidities including: DM2      That are currently affecting medical management.   I expect  patient to be hospitalized for 2 midnights requiring inpatient medical care.  Patient is at high risk for adverse outcome  (such as loss of life or disability) if not treated.  Indication for inpatient stay as follows:  Severe change from baseline regarding mental status Hemodynamic instability despite maximal medical therapy,    New or worsening hypoxia  Need for IV steroid     Level of care     SDU tele indefinitely please discontinue once patient no longer qualifies  Precautions:  Airborne and Contact precautions  PPE: Used by the provider:   P100  eye Goggles,  Gloves  gown   Nivea Wojdyla 02/15/2019, 7:52 PM    [facility]     after 2 AM please page floor coverage PA If 7AM-7PM, please contact the day team taking care of the patient using [email or web address]

## 2019-02-15 NOTE — ED Provider Notes (Addendum)
[facility] DEPT Provider Note   CSN: [id number] Arrival date & time: 02/15/19  1508     History   Chief Complaint Chief Complaint  Patient presents with   COVID +   Shortness of Breath    HPI Lindsey Baldwin is a 73 y.o. female.     The history is provided by the patient and medical records. No language interpreter was used.  Shortness of Breath    72 year old female with history of hypertension, obstructive sleep apnea, GERD, recently test positive for COVID-19 presenting for evaluation of shortness of breath.  For the past 2 weeks patient has had a persistent nonproductive cough, nasal congestion, chest soreness, and decrease in appetite, generalized weakness, body aches and a fever.  Tmax have been 102, improves with tylenol. NO lost of taste or smell. No N/V/D. She has been seen in the ED several times for this most recent was 2 days ago for her symptoms.  A Covid test was obtained at that time and is positive.  She is here today due to worsening shortness of breath.  States she gets out of breath even with walking a short distance which is new.  When EMS arrived, patient's O2 was at 86% on room air.  Patient brought here for further management of her condition.  Patient denies any recent sick contact.  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   Bronchitis    Family history of adverse reaction to anesthesia    MOTHER-- PONV   GERD (gastroesophageal reflux disease)    Glaucoma, right eye    CLOSED ANGLE   Hypertension    OA (osteoarthritis)    KNEES , HANDS   OAB (overactive bladder)    OSA on CPAP    Osteoporosis    Type 2 diabetes mellitus ([place])    FOLLOWED BY PCP   Wears glasses     Patient Active Problem List   Diagnosis Date Noted   Osteoarthritis of right knee 12/12/2017    Past Surgical History:  Procedure Laterality Date   CYST EXCISION  08/2017   POSTERIOR NECK    CYSTOSCOPY WITH RETROGRADE PYELOGRAM, URETEROSCOPY AND STENT PLACEMENT Left 06/16/2018   Procedure: diagnostic URETEROSCOPY AND STENT PLACEMENT;  Surgeon: Cleon Gustin, MD;  Location: [facility];  Service: Urology;  Laterality: Left;  1 HR   EYE SURGERY Left 1992   REMOVE EYE AND PLACEMENT PROSTHESIS   KNEE ARTHROPLASTY Right 12/12/2017   Procedure: RIGHT TOTAL KNEE ARTHROPLASTY WITH COMPUTER NAVIGATION;  Surgeon: Rod Can, MD;  Location: [facility];  Service: Orthopedics;  Laterality: Right;  Needs RNFA   TONSILLECTOMY  AGE 63     OB History   No obstetric history on file.      Home Medications    Prior to Admission medications   Medication Sig Start Date End Date Taking? Authorizing Provider  acetaminophen (TYLENOL) 500 MG tablet Take 500 mg by mouth every 6 (six) hours as needed (for pain.).    [provider]  apraclonidine (IOPIDINE) 0.5 % ophthalmic solution Place 1 drop into the right eye 2 (two) times daily. 11/21/17   [provider]  aspirin 81 MG chewable tablet Chew 1 tablet (81 mg total) by mouth 2 (two) times daily. 12/13/17   Swinteck, Aaron Edelman, MD  atorvastatin (LIPITOR) 40 MG tablet Take 40 mg by mouth every evening.     [provider]  Calcium  Carb-Cholecalciferol (CALCIUM + D3 PO) Take 1 tablet by mouth daily.    [provider]  calcium carbonate (TUMS - DOSED IN MG ELEMENTAL CALCIUM) 500 MG chewable tablet Chew 1 tablet by mouth 4 (four) times daily as needed for indigestion or heartburn.     [provider]  enalapril (VASOTEC) 2.5 MG tablet Take 2.5 mg by mouth daily.     [provider]  furosemide (LASIX) 20 MG tablet Take 20 mg by mouth daily.     [provider]  HYDROcodone-acetaminophen (NORCO) 5-325 MG tablet Take 1 tablet by mouth every 6 (six) hours as needed for severe pain. 06/16/18   McKenzie, Candee Furbish, MD  ibuprofen (ADVIL,MOTRIN) 200 MG tablet Take 200 mg by mouth every 8 (eight)  hours as needed (pain.).    [provider]  latanoprost (XALATAN) 0.005 % ophthalmic solution Place 1 drop into the right eye at bedtime.    [provider]  metFORMIN (GLUCOPHAGE-XR) 500 MG 24 hr tablet Take 1,000 mg by mouth daily. 04/21/18   [provider]  mirabegron ER (MYRBETRIQ) 50 MG TB24 tablet Take 50 mg by mouth daily.     [provider]  Multiple Vitamins-Minerals (MULTIVITAMIN WITH MINERALS) tablet Take 1 tablet by mouth daily.    [provider]  sitaGLIPtin (JANUVIA) 100 MG tablet Take 100 mg by mouth every morning.    [provider]  timolol (BETIMOL) 0.5 % ophthalmic solution Place 1 drop into the right eye 2 (two) times daily.    [provider]    Family History Family History  Problem Relation Age of Onset   Cancer Mother    Cancer Father    Breast cancer Maternal Aunt     Social History Social History   Tobacco Use   Smoking status: Never Smoker   Smokeless tobacco: Never Used  Substance Use Topics   Alcohol use: No   Drug use: Never     Allergies   Penicillins and Sulfa antibiotics   Review of Systems Review of Systems  Respiratory: Positive for shortness of breath.   All other systems reviewed and are negative.    Physical Exam Updated Vital Signs BP (!) 152/70 (BP Location: Left Arm)    Pulse 86    Temp 98.4 F (36.9 C) (Oral)    Resp (!) 26    SpO2 98%   Physical Exam Vitals signs and nursing note reviewed.  Constitutional:      General: She is not in acute distress.    Appearance: She is well-developed. She is obese.  HENT:     Head: Atraumatic.  Eyes:     Conjunctiva/sclera: Conjunctivae normal.  Neck:     Musculoskeletal: Neck supple.  Cardiovascular:     Rate and Rhythm: Tachycardia present.  Pulmonary:     Effort: Tachypnea present. No accessory muscle usage.     Breath sounds: Decreased breath sounds and rhonchi present. No wheezing or rales.  Chest:      Chest wall: No tenderness.  Abdominal:     Palpations: Abdomen is soft.  Musculoskeletal:     Right lower leg: No edema.     Left lower leg: No edema.  Skin:    Findings: No rash.  Neurological:     General: No focal deficit present.     Mental Status: She is alert.  Psychiatric:        Mood and Affect: Mood normal.      ED Treatments /  Results  Labs (all labs ordered are listed, but only abnormal results are displayed) Labs Reviewed  CBC WITH DIFFERENTIAL/PLATELET - Abnormal; Notable for the following components:      Result Value   Platelets 132 (*)    All other components within normal limits  COMPREHENSIVE METABOLIC PANEL - Abnormal; Notable for the following components:   Glucose, Bld 157 (*)    BUN 26 (*)    Calcium 8.7 (*)    AST 93 (*)    ALT 58 (*)    GFR calc non Af Amer 58 (*)    All other components within normal limits  LACTATE DEHYDROGENASE - Abnormal; Notable for the following components:   LDH 335 (*)    All other components within normal limits  FERRITIN - Abnormal; Notable for the following components:   Ferritin 720 (*)    All other components within normal limits  TRIGLYCERIDES - Abnormal; Notable for the following components:   Triglycerides 163 (*)    All other components within normal limits  C-REACTIVE PROTEIN - Abnormal; Notable for the following components:   CRP 9.9 (*)    All other components within normal limits  CULTURE, BLOOD (ROUTINE X 2)  CULTURE, BLOOD (ROUTINE X 2)  LACTIC ACID, PLASMA  LACTIC ACID, PLASMA  D-DIMER, QUANTITATIVE (NOT AT [facility])  PROCALCITONIN  FIBRINOGEN    EKG EKG Interpretation  Date/Time:  Sunday February 15 2019 17:20:18 EST Ventricular Rate:  82 PR Interval:    QRS Duration: 90 QT Interval:  371 QTC Calculation: 434 R Axis:   16 Text Interpretation: Sinus rhythm Normal ECG No significant change since last tracing Confirmed by Lajean Saver [phone]) on 02/15/2019 5:31:50 PM   Radiology Dg Chest  Port 1 View  Result Date: 02/15/2019 CLINICAL DATA:  Short of breath chest pain COVID EXAM: PORTABLE CHEST 1 VIEW COMPARISON:  02/12/2019 FINDINGS: Interim development of patchy airspace disease within the bilateral mid to lower lung zones. No pleural effusion. Stable cardiomediastinal silhouette. No pneumothorax. Age indeterminate suspected fracture deformity at the proximal right humerus. IMPRESSION: 1. Interim development of bilateral airspace disease in the mid to lower lungs compatible with multifocal pneumonia 2. Age indeterminate deformity of the proximal right humerus. Dedicated right shoulder radiographs may be obtained if clinically warranted. Electronically Signed   By: Donavan Foil M.D.   On: 02/15/2019 16:47    Procedures .Critical Care Performed by: Domenic Moras, PA-C Authorized by: Domenic Moras, PA-C   Critical care provider statement:    Critical care time (minutes):  30   Critical care was time spent personally by me on the following activities:  Discussions with consultants, evaluation of patient's response to treatment, examination of patient, ordering and performing treatments and interventions, ordering and review of laboratory studies, ordering and review of radiographic studies, pulse oximetry, re-evaluation of patient's condition, obtaining history from patient or surrogate and review of old charts   (including critical care time)  Medications Ordered in ED Medications - No data to display   Initial Impression / Assessment and Plan / ED Course  I have reviewed the triage vital signs and the nursing notes.  Pertinent labs & imaging results that were available during my care of the patient were reviewed by me and considered in my medical decision making (see chart for details).        BP (!) 152/70 (BP Location: Left Arm)    Pulse 86    Temp 98.4 F (36.9 C) (Oral)  Resp (!) 26    SpO2 98%    Final Clinical Impressions(s) / ED Diagnoses   Final diagnoses:    Acute respiratory disease due to COVID-19 virus  Hypoxia    ED Discharge Orders    None     3:44 PM Patient with cold symptoms ongoing for more than a week.  Recently test positive COVID-19 2 days ago here with worsening shortness of breath.  O2 sats was at 86% on room air improved with nonrebreather.  She appears more comfortable during exam, however unable to speak in complete sentences. Work up initiated.  Anticipate admission for acute respiratory failure with hypoxia due to COVID-19  Waldemar Dickens was evaluated in Emergency Department on 02/15/2019 for the symptoms described in the history of present illness. She was evaluated in the context of the global COVID-19 pandemic, which necessitated consideration that the patient might be at risk for infection with the SARS-CoV-2 virus that causes COVID-19. Institutional protocols and algorithms that pertain to the evaluation of patients at risk for COVID-19 are in a state of rapid change based on information released by regulatory bodies including the CDC and federal and state organizations. These policies and algorithms were followed during the patient's care in the ED.  6:37 PM Chest x-ray shows bilateral airspace disease compatible with multifocal pneumonia.  Evidence of increased inflammatory markers such as elevated triglyceride of 163, LDH 335, CRP 9.9, ferritin 720, D-dimer pending.  Evidence of transaminitis with AST 93, ALT 58.  Normal lactic acid.  At this time patient is adequately oxygenated with 6 L of O2.  O2 sats is 98%.  Appreciate consultation from [place], Dr. Roel Cluck who agrees to see and admit pt to [facility] for further management of her respiratory illness 2/2 COVID-19.  Care discussed with DR. Ashok Cordia.   Domenic Moras, PA-C 02/15/19 1840    Domenic Moras, PA-C 02/15/19 1843    Lajean Saver, MD 02/16/19 1011

## 2019-02-15 NOTE — ED Notes (Signed)
[facility] dispatch notified for need of transport to [facility].

## 2019-02-15 NOTE — ED Notes (Signed)
Please contact Laurena Bering for updates and any questions [phone]

## 2019-02-16 ENCOUNTER — Encounter (HOSPITAL_COMMUNITY): Payer: Self-pay | Admitting: *Deleted

## 2019-02-16 LAB — COMPREHENSIVE METABOLIC PANEL
ALT: 49 U/L — ABNORMAL HIGH (ref 0–44)
AST: 80 U/L — ABNORMAL HIGH (ref 15–41)
Albumin: 3.2 g/dL — ABNORMAL LOW (ref 3.5–5.0)
Alkaline Phosphatase: 35 U/L — ABNORMAL LOW (ref 38–126)
Anion gap: 11 (ref 5–15)
BUN: 19 mg/dL (ref 8–23)
CO2: 20 mmol/L — ABNORMAL LOW (ref 22–32)
Calcium: 8.3 mg/dL — ABNORMAL LOW (ref 8.9–10.3)
Chloride: 108 mmol/L (ref 98–111)
Creatinine, Ser: 0.78 mg/dL (ref 0.44–1.00)
GFR calc Af Amer: >60 mL/min (ref >60)
GFR calc non Af Amer: >60 mL/min (ref >60)
Glucose, Bld: 231 mg/dL — ABNORMAL HIGH (ref 70–99)
Potassium: 4.4 mmol/L (ref 3.5–5.1)
Sodium: 139 mmol/L (ref 135–145)
Total Bilirubin: 0.5 mg/dL (ref 0.3–1.2)
Total Protein: 6.7 g/dL (ref 6.5–8.1)

## 2019-02-16 LAB — FERRITIN: Ferritin: 911 ng/mL — ABNORMAL HIGH (ref 11–307)

## 2019-02-16 LAB — ABO/RH: ABO/RH(D): O POS

## 2019-02-16 LAB — SAMPLE TO BLOOD BANK

## 2019-02-16 LAB — GLUCOSE, CAPILLARY
Glucose-Capillary: 194 mg/dL — ABNORMAL HIGH (ref 70–99)
Glucose-Capillary: 308 mg/dL — ABNORMAL HIGH (ref 70–99)
Glucose-Capillary: 249 mg/dL — ABNORMAL HIGH (ref 70–99)

## 2019-02-16 LAB — D-DIMER, QUANTITATIVE: D-Dimer, Quant: 1.45 ug/mL-FEU — ABNORMAL HIGH (ref 0.00–0.50)

## 2019-02-16 LAB — C-REACTIVE PROTEIN: CRP: 12.6 mg/dL — ABNORMAL HIGH (ref <1.0)

## 2019-02-16 MED ORDER — METHYLPREDNISOLONE SODIUM SUCC 40 MG IJ SOLR
40.0000 mg | Freq: Three times a day (TID) | INTRAMUSCULAR | Status: DC
  Administered 2019-02-16 – 2019-02-18 (×7): 40 mg via INTRAVENOUS
  Filled 2019-02-16 (×7): qty 1

## 2019-02-16 MED ORDER — SENNOSIDES-DOCUSATE SODIUM 8.6-50 MG PO TABS: 2.0000 | ORAL_TABLET | Freq: Every evening | ORAL | Status: DC | PRN

## 2019-02-16 MED ORDER — IPRATROPIUM-ALBUTEROL 20-100 MCG/ACT IN AERS
1.0000 | INHALATION_SPRAY | Freq: Four times a day (QID) | RESPIRATORY_TRACT | Status: DC
  Administered 2019-02-16 – 2019-03-04 (×65): 1 via RESPIRATORY_TRACT
  Filled 2019-02-16 (×2): qty 4

## 2019-02-16 MED ORDER — FAMOTIDINE 20 MG PO TABS
20.0000 mg | ORAL_TABLET | Freq: Every day | ORAL | Status: DC
  Administered 2019-02-16 – 2019-03-13 (×26): 20 mg via ORAL
  Filled 2019-02-16 (×29): qty 1

## 2019-02-16 MED ORDER — SODIUM CHLORIDE 0.9 % IV SOLN
100.0000 mg | Freq: Once | INTRAVENOUS | Status: AC
  Administered 2019-02-16: 16:00:00 100 mg via INTRAVENOUS
  Filled 2019-02-16: qty 100

## 2019-02-16 MED ORDER — SODIUM CHLORIDE 0.9% IV SOLUTION
Freq: Once | INTRAVENOUS | Status: AC
  Administered 2019-02-16: 16:00:00 via INTRAVENOUS

## 2019-02-16 MED ORDER — SODIUM CHLORIDE 0.9 % IV SOLN
100.0000 mg | INTRAVENOUS | Status: AC
  Administered 2019-02-17 – 2019-02-19 (×3): 100 mg via INTRAVENOUS
  Filled 2019-02-16 (×3): qty 100

## 2019-02-16 NOTE — Progress Notes (Signed)
PROGRESS NOTE    Lindsey Baldwin  [id number] DOB: Apr 14, 1945 DOA: 02/15/2019 PCP: Donald Prose, MD   Brief Narrative:  73 year old with history of HTN, DM 2, GERD, OSA on CPAP came with a week of shortness of breath which worsened.  She was diagnosed with COVID-19 with hypoxia.  Chest x-ray showed bilateral infiltrate.  Transferred to [facility]:   Active Problems:   Pneumonia due to COVID-19 virus   Acute respiratory failure due to COVID-19 ([place])   DM2 (diabetes mellitus, type 2) ([place])   Essential hypertension   Hyperlipemia   OSA on CPAP  Acute respiratory distress with hypoxia secondary to COVID-19 pneumonia Bilateral pulmonary infiltrates mid to lower lungs -Oxygen levels-6 L nasal cannula. -Remdesivir-day 2 -IV Solu-Medrol -Routine: Labs have been reviewed including ferritin, LDH, CRP, d-dimer, fibrinogen.  Will need to trend this lab daily. -Vitamin C & Zinc. Prone >16hrs/day.  -Check BMP -Procalcitonin 0.1 -Chest x-ray-bilateral airspace disease mid to lower lungs -Supportive care-antitussive, inhalers, I-S/flutter -CODE STATUS confirmed  Had extensive discussion with the patient regarding  emergent use of convalescent plasma given worsening of shortness of breath, oxygen requirement from hypoxia and elevation in inflammatory markers.   Patient and family understood and all the questions answered.  Agreed to proceed with it.  Essential hypertension -Vasotec 2.5 mg daily  Hyperlipidemia -Statin  Diabetes mellitus type 2 -Insulin sliding scale and Accu-Chek.  May need to add low-dose basal -Hold Metformin and sitagliptin on hold  Obstructive sleep apnea -Hold off on CPAP  DVT prophylaxis: Lovenox Code Status: Full code Family Communication:  Spoke with Seth Bake, her POA.  Disposition Plan: Maintain inpatient due to hypoxia.     Subjective: Feels ok, still needing NCHF >5L.  Denies sob at rest.   Review of Systems  Otherwise negative except as per HPI, including: General: Denies fever, chills, night sweats or unintended weight loss. Resp: Denies cough, wheezing, shortness of breath. Cardiac: Denies chest pain, palpitations, orthopnea, paroxysmal nocturnal dyspnea. GI: Denies abdominal pain, nausea, vomiting, diarrhea or constipation GU: Denies dysuria, frequency, hesitancy or incontinence MS: Denies muscle aches, joint pain or swelling Neuro: Denies headache, neurologic deficits (focal weakness, numbness, tingling), abnormal gait Psych: Denies anxiety, depression, SI/HI/AVH Skin: Denies new rashes or lesions ID: Denies sick contacts, exotic exposures, travel  Objective: Vitals:   02/15/19 2258 02/15/19 2300 02/16/19 0435 02/16/19 0436  BP:   (!) 150/71   Pulse:      Resp:      Temp: 99.1 F (37.3 C) 98.9 F (37.2 C)  98.7 F (37.1 C)  TempSrc: Oral Oral  Oral  SpO2:      Weight:  75.6 kg    Height:  5\' 2"  (1.575 m)      Intake/Output Summary (Last 24 hours) at 02/16/2019 0721 Last data filed at 02/15/2019 2306 Gross per 24 hour  Intake 250 ml  Output -  Net 250 ml   Filed Weights   02/15/19 2300  Weight: 75.6 kg    Examination:  General exam: Appears calm and comfortable; 4L HFNC Respiratory system: b/l rhonchi.  Cardiovascular system: S1 & S2 heard, RRR. No JVD, murmurs, rubs, gallops or clicks. No pedal edema. Gastrointestinal system: Abdomen is nondistended, soft and nontender. No organomegaly or masses felt. Normal bowel sounds heard. Central nervous system: Alert and oriented. No focal neurological deficits. Extremities: Symmetric 5 x 5 power. Skin: No rashes, lesions or ulcers Psychiatry: Judgement and insight appear normal. Mood &  affect appropriate.     Data Reviewed:   CBC: Recent Labs  Lab 02/12/19 0124 02/15/19 1726  WBC 5.6 4.6  NEUTROABS 4.0 3.1  HGB 13.2 13.5  HCT 40.2 42.8  MCV 93.1 94.1  PLT 127* [id number]*   Basic Metabolic Panel: Recent Labs  Lab  02/12/19 0124 02/15/19 1726  NA 137 139  K 4.2 4.3  CL 105 106  CO2 21* 24  GLUCOSE 200* 157*  BUN 19 26*  CREATININE 1.00 0.98  CALCIUM 9.0 8.7*   GFR: Estimated Creatinine Clearance: 50.1 mL/min (by C-G formula based on SCr of 0.98 mg/dL). Liver Function Tests: Recent Labs  Lab 02/15/19 1726  AST 93*  ALT 58*  ALKPHOS 39  BILITOT 0.6  PROT 7.2  ALBUMIN 3.7   No results for input(s): LIPASE, AMYLASE in the last 168 hours. No results for input(s): AMMONIA in the last 168 hours. Coagulation Profile: No results for input(s): INR, PROTIME in the last 168 hours. Cardiac Enzymes: No results for input(s): CKTOTAL, CKMB, CKMBINDEX, TROPONINI in the last 168 hours. BNP (last 3 results) No results for input(s): PROBNP in the last 8760 hours. HbA1C: No results for input(s): HGBA1C in the last 72 hours. CBG: No results for input(s): GLUCAP in the last 168 hours. Lipid Profile: Recent Labs    02/15/19 1725  TRIG 163*   Thyroid Function Tests: No results for input(s): TSH, T4TOTAL, FREET4, T3FREE, THYROIDAB in the last 72 hours. Anemia Panel: Recent Labs    02/15/19 1728  FERRITIN 720*   Sepsis Labs: Recent Labs  Lab 02/15/19 1726 02/15/19 1728 02/15/19 1741  PROCALCITON 0.12  --   --   LATICACIDVEN  --  1.5 1.5    Recent Results (from the past 240 hour(s))  SARS CORONAVIRUS 2 (TAT 6-24 HRS) Nasopharyngeal Nasopharyngeal Swab     Status: Abnormal   Collection Time: 02/13/19  8:10 PM   Specimen: Nasopharyngeal Swab  Result Value Ref Range Status   SARS Coronavirus 2 POSITIVE (A) NEGATIVE Final    Comment: RESULTS EMAILED TO RN Cecille Rubin [facility] AT [id number] 02/14/2019 BY L BENFIELD (NOTE) SARS-CoV-2 target nucleic acids are DETECTED. The SARS-CoV-2 RNA is generally detectable in upper and lower respiratory specimens during the acute phase of infection. Positive results are indicative of active infection with SARS-CoV-2. Clinical  correlation with patient history and  other diagnostic information is necessary to determine patient infection status. Positive results do  not rule out bacterial infection or co-infection with other viruses. The expected result is Negative. Fact Sheet for Patients: SugarRoll.be Fact Sheet for Healthcare Providers: https://www.woods-mathews.com/ This test is not yet approved or cleared by the [place] FDA and  has been authorized for detection and/or diagnosis of SARS-CoV-2 by FDA under an Emergency Use Authorization (EUA). This EUA will remain  in effect (meaning this test can be used) for the duration of the  COVID-19 declaration under Section 564(b)(1) of the Act, 21 U.S.C. section 360bbb-3(b)(1), unless the authorization is terminated or revoked sooner. Performed at [facility], [place] [address] [place],  [address]          Radiology Studies: Dg Chest Port 1 View  Result Date: 02/15/2019 CLINICAL DATA:  Short of breath chest pain COVID EXAM: PORTABLE CHEST 1 VIEW COMPARISON:  02/12/2019 FINDINGS: Interim development of patchy airspace disease within the bilateral mid to lower lung zones. No pleural effusion. Stable cardiomediastinal silhouette. No pneumothorax. Age indeterminate suspected fracture deformity at the proximal right humerus. IMPRESSION:  1. Interim development of bilateral airspace disease in the mid to lower lungs compatible with multifocal pneumonia 2. Age indeterminate deformity of the proximal right humerus. Dedicated right shoulder radiographs may be obtained if clinically warranted. Electronically Signed   By: Donavan Foil M.D.   On: 02/15/2019 16:47        Scheduled Meds: . apraclonidine  1 drop Right Eye BID  . aspirin  81 mg Oral BID  . atorvastatin  40 mg Oral QPM  . dexamethasone (DECADRON) injection  6 mg Intravenous Q24H  . enalapril  2.5 mg Oral Daily  . enoxaparin (LOVENOX) injection  40 mg Subcutaneous Q24H  . furosemide   20 mg Oral Daily  . insulin aspart  0-5 Units Subcutaneous QHS  . insulin aspart  0-9 Units Subcutaneous TID WC  . latanoprost  1 drop Right Eye QHS  . mirabegron ER  50 mg Oral Daily  . sodium chloride flush  3 mL Intravenous Q12H  . sodium chloride flush  3 mL Intravenous Q12H  . timolol  1 drop Right Eye BID   Continuous Infusions: . sodium chloride    . remdesivir 100 mg in NS 250 mL    . [START ON 02/17/2019] remdesivir 100 mg in NS 250 mL       LOS: 1 day   Time spent=35 mins    Maranatha Grossi Arsenio Loader, MD [facility]  If 7PM-7AM, please contact night-coverage  02/16/2019, 7:21 AM

## 2019-02-16 NOTE — Plan of Care (Signed)
Plan of care initiated and reviewed with the patient. 

## 2019-02-16 NOTE — Plan of Care (Signed)

## 2019-02-17 LAB — BPAM FFP
Blood Product Expiration Date: 202011171335
ISSUE DATE / TIME: 202011161400
Unit Type and Rh: 9500

## 2019-02-17 LAB — D-DIMER, QUANTITATIVE: D-Dimer, Quant: 1.32 ug/mL-FEU — ABNORMAL HIGH (ref 0.00–0.50)

## 2019-02-17 LAB — CBC WITH DIFFERENTIAL/PLATELET
Abs Immature Granulocytes: 0.04 10*3/uL (ref 0.00–0.07)
Basophils Absolute: 0 10*3/uL (ref 0.0–0.1)
Basophils Relative: 0 %
Eosinophils Absolute: 0 10*3/uL (ref 0.0–0.5)
Eosinophils Relative: 0 %
HCT: 43.7 % (ref 36.0–46.0)
Hemoglobin: 13.7 g/dL (ref 12.0–15.0)
Immature Granulocytes: 1 %
Lymphocytes Relative: 18 %
Lymphs Abs: 1.4 10*3/uL (ref 0.7–4.0)
MCH: 29.3 pg (ref 26.0–34.0)
MCHC: 31.4 g/dL (ref 30.0–36.0)
MCV: 93.6 fL (ref 80.0–100.0)
Monocytes Absolute: 0.3 10*3/uL (ref 0.1–1.0)
Monocytes Relative: 4 %
Neutro Abs: 6 10*3/uL (ref 1.7–7.7)
Neutrophils Relative %: 77 %
Platelets: 165 10*3/uL (ref 150–400)
RBC: 4.67 MIL/uL (ref 3.87–5.11)
RDW: 13.7 % (ref 11.5–15.5)
WBC: 7.8 10*3/uL (ref 4.0–10.5)
nRBC: 0 % (ref 0.0–0.2)

## 2019-02-17 LAB — GLUCOSE, CAPILLARY
Glucose-Capillary: 247 mg/dL — ABNORMAL HIGH (ref 70–99)
Glucose-Capillary: 273 mg/dL — ABNORMAL HIGH (ref 70–99)
Glucose-Capillary: 291 mg/dL — ABNORMAL HIGH (ref 70–99)
Glucose-Capillary: 385 mg/dL — ABNORMAL HIGH (ref 70–99)
Glucose-Capillary: 363 mg/dL — ABNORMAL HIGH (ref 70–99)
Glucose-Capillary: 282 mg/dL — ABNORMAL HIGH (ref 70–99)

## 2019-02-17 LAB — COMPREHENSIVE METABOLIC PANEL
ALT: 44 U/L (ref 0–44)
AST: 71 U/L — ABNORMAL HIGH (ref 15–41)
Albumin: 3.2 g/dL — ABNORMAL LOW (ref 3.5–5.0)
Alkaline Phosphatase: 38 U/L (ref 38–126)
Anion gap: 14 (ref 5–15)
BUN: 25 mg/dL — ABNORMAL HIGH (ref 8–23)
CO2: 20 mmol/L — ABNORMAL LOW (ref 22–32)
Calcium: 8.6 mg/dL — ABNORMAL LOW (ref 8.9–10.3)
Chloride: 106 mmol/L (ref 98–111)
Creatinine, Ser: 0.83 mg/dL (ref 0.44–1.00)
GFR calc Af Amer: >60 mL/min (ref >60)
GFR calc non Af Amer: >60 mL/min (ref >60)
Glucose, Bld: 257 mg/dL — ABNORMAL HIGH (ref 70–99)
Potassium: 4.6 mmol/L (ref 3.5–5.1)
Sodium: 140 mmol/L (ref 135–145)
Total Bilirubin: 0.7 mg/dL (ref 0.3–1.2)
Total Protein: 6.7 g/dL (ref 6.5–8.1)

## 2019-02-17 LAB — PREPARE FRESH FROZEN PLASMA: Unit division: 0

## 2019-02-17 LAB — FERRITIN: Ferritin: 1016 ng/mL — ABNORMAL HIGH (ref 11–307)

## 2019-02-17 LAB — MAGNESIUM: Magnesium: 2.4 mg/dL (ref 1.7–2.4)

## 2019-02-17 LAB — C-REACTIVE PROTEIN: CRP: 10.4 mg/dL — ABNORMAL HIGH (ref <1.0)

## 2019-02-17 LAB — BRAIN NATRIURETIC PEPTIDE: B Natriuretic Peptide: 134.8 pg/mL — ABNORMAL HIGH (ref 0.0–100.0)

## 2019-02-17 MED ORDER — INSULIN GLARGINE 100 UNIT/ML ~~LOC~~ SOLN
12.0000 [IU] | Freq: Every day | SUBCUTANEOUS | Status: DC
  Administered 2019-02-17 – 2019-02-18 (×2): 12 [IU] via SUBCUTANEOUS
  Filled 2019-02-17 (×3): qty 0.12

## 2019-02-17 MED ORDER — TOCILIZUMAB 400 MG/20ML IV SOLN
600.0000 mg | Freq: Once | INTRAVENOUS | Status: AC
  Administered 2019-02-17: 12:00:00 600 mg via INTRAVENOUS
  Filled 2019-02-17: qty 20

## 2019-02-17 NOTE — Progress Notes (Addendum)
PROGRESS NOTE    Lindsey Baldwin  [id number] DOB: 06/01/1945 DOA: 02/15/2019 PCP: Donald Prose, MD   Brief Narrative:  73 year old with history of HTN, DM 2, GERD, OSA on CPAP came with a week of shortness of breath which worsened.  She was diagnosed with COVID-19 with hypoxia.  Chest x-ray showed bilateral infiltrate.  Transferred to [facility].  Started on remdesivir, Solu-Medrol.  Received a dose of plasma 02/16/2019.  Actemra ordered 11/17   Assessment & Plan:   Active Problems:   Pneumonia due to COVID-19 virus   Acute respiratory failure due to COVID-19 [facility])   DM2 (diabetes mellitus, type 2) ([place])   Essential hypertension   Hyperlipemia   OSA on CPAP  Acute respiratory distress with hypoxia secondary to COVID-19 pneumonia, slightly worsening hypoxia Bilateral pulmonary infiltrates mid to lower lungs -Oxygen levels-13 L high flow -Day 3 remdesivir and Solu-Medrol -Plasma given 11/16 -Elevated CRP, Actemra ordered. -trend inflammatory markers -Vitamin C & Zinc. Prone >16hrs/day.  -Procalcitonin 0.1 -Chest x-ray-bilateral airspace disease mid to lower lungs -Supportive care-antitussive, inhalers, I-S/flutter -CODE STATUS confirmed  Had extensive discussion with the patient regarding off label use of Actemra and emergent use of convalescent plasma given worsening of shortness of breath, oxygen requirement from hypoxia and elevation in inflammatory markers.  Denies any active/latent tuberculosis, hepatitis infection, active immunosuppressive state including chemotherapy.  No severe sepsis.  Patient and family understood and all the questions answered.  Agreed to proceed with it.  Received plasma 11/16.  Actemra ordered today  Essential hypertension -Vasotec 2.5 mg daily  Hyperlipidemia -Statin  Diabetes mellitus type 2 Hyperglycemia due to steroid use -Insulin sliding scale and Accu-Chek.   -Hold Metformin and sitagliptin on hold.  Lantus 12 units  daily added  Obstructive sleep apnea -Hold off on CPAP  DVT prophylaxis: Lovenox Code Status: Full code Family Communication:  Spoke with Seth Bake, her POA Disposition Plan: Maintain inpatient due to hypoxia.     Subjective: Slightly more short of breath requiring 15 L of high flow.  Hypoxia down to low 80% with minimal movement.  Review of Systems Otherwise negative except as per HPI, including: General = no fevers, chills, dizziness, malaise, fatigue HEENT/EYES = negative for pain, redness, loss of vision, double vision, blurred vision, loss of hearing, sore throat, hoarseness, dysphagia Cardiovascular= negative for chest pain, palpitation, murmurs, lower extremity swelling Respiratory/lungs= negative for  hemoptysis Gastrointestinal= negative for nausea, vomiting,, abdominal pain, melena, hematemesis Genitourinary= negative for Dysuria, Hematuria, Change in Urinary Frequency MSK = Negative for arthralgia, myalgias, Back Pain, Joint swelling  Neurology= Negative for headache, seizures, numbness, tingling  Psychiatry= Negative for anxiety, depression, suicidal and homocidal ideation Allergy/Immunology= Medication/Food allergy as listed  Skin= Negative for Rash, lesions, ulcers, itching   Objective: Vitals:   02/16/19 1515 02/16/19 1545 02/16/19 1924 02/17/19 0400  BP: (!) 142/70 113/79 (!) 152/66 125/76  Pulse: 86 87  80  Resp: (!) 25 20 (!) 29 17  Temp: 98.5 F (36.9 C) 97.7 F (36.5 C) 99.1 F (37.3 C) 98.9 F (37.2 C)  TempSrc: Oral Oral Oral Oral  SpO2: (!) 89% (!) 88%  95%  Weight:      Height:       No intake or output data in the 24 hours ending 02/17/19 0723 Filed Weights   02/15/19 2300  Weight: 75.6 kg    Examination:  Constitutional: Not in acute distress while at rest, Dyspnea with movement; 15 HF [place] Respiratory: Diffuse rhonchi.  Cardiovascular:  Normal sinus rhythm, no rubs Abdomen: Nontender nondistended good bowel sounds Musculoskeletal: No  edema noted Skin: No rashes seen Neurologic: CN 2-12 grossly intact.  And nonfocal Psychiatric: Normal judgment and insight. Alert and oriented x 3. Normal mood.   Data Reviewed:   CBC: Recent Labs  Lab 02/12/19 0124 02/15/19 1726  WBC 5.6 4.6  NEUTROABS 4.0 3.1  HGB 13.2 13.5  HCT 40.2 42.8  MCV 93.1 94.1  PLT 127* [id number]*   Basic Metabolic Panel: Recent Labs  Lab 02/12/19 0124 02/15/19 1726 02/16/19 0920  NA 137 139 139  K 4.2 4.3 4.4  CL 105 106 108  CO2 21* 24 20*  GLUCOSE 200* 157* 231*  BUN 19 26* 19  CREATININE 1.00 0.98 0.78  CALCIUM 9.0 8.7* 8.3*   GFR: Estimated Creatinine Clearance: 61.4 mL/min (by C-G formula based on SCr of 0.78 mg/dL). Liver Function Tests: Recent Labs  Lab 02/15/19 1726 02/16/19 0920  AST 93* 80*  ALT 58* 49*  ALKPHOS 39 35*  BILITOT 0.6 0.5  PROT 7.2 6.7  ALBUMIN 3.7 3.2*   No results for input(s): LIPASE, AMYLASE in the last 168 hours. No results for input(s): AMMONIA in the last 168 hours. Coagulation Profile: No results for input(s): INR, PROTIME in the last 168 hours. Cardiac Enzymes: No results for input(s): CKTOTAL, CKMB, CKMBINDEX, TROPONINI in the last 168 hours. BNP (last 3 results) No results for input(s): PROBNP in the last 8760 hours. HbA1C: No results for input(s): HGBA1C in the last 72 hours. CBG: Recent Labs  Lab 02/16/19 0759 02/16/19 1611 02/16/19 2131  GLUCAP 194* 308* 249*   Lipid Profile: Recent Labs    02/15/19 1725  TRIG 163*   Thyroid Function Tests: No results for input(s): TSH, T4TOTAL, FREET4, T3FREE, THYROIDAB in the last 72 hours. Anemia Panel: Recent Labs    02/15/19 1728 02/16/19 0920  FERRITIN 720* 911*   Sepsis Labs: Recent Labs  Lab 02/15/19 1726 02/15/19 1728 02/15/19 1741  PROCALCITON 0.12  --   --   LATICACIDVEN  --  1.5 1.5    Recent Results (from the past 240 hour(s))  SARS CORONAVIRUS 2 (TAT 6-24 HRS) Nasopharyngeal Nasopharyngeal Swab     Status: Abnormal    Collection Time: 02/13/19  8:10 PM   Specimen: Nasopharyngeal Swab  Result Value Ref Range Status   SARS Coronavirus 2 POSITIVE (A) NEGATIVE Final    Comment: RESULTS EMAILED TO RN Cecille Rubin [facility] AT [id number] 02/14/2019 BY L BENFIELD (NOTE) SARS-CoV-2 target nucleic acids are DETECTED. The SARS-CoV-2 RNA is generally detectable in upper and lower respiratory specimens during the acute phase of infection. Positive results are indicative of active infection with SARS-CoV-2. Clinical  correlation with patient history and other diagnostic information is necessary to determine patient infection status. Positive results do  not rule out bacterial infection or co-infection with other viruses. The expected result is Negative. Fact Sheet for Patients: SugarRoll.be Fact Sheet for Healthcare Providers: https://www.woods-mathews.com/ This test is not yet approved or cleared by the [place] FDA and  has been authorized for detection and/or diagnosis of SARS-CoV-2 by FDA under an Emergency Use Authorization (EUA). This EUA will remain  in effect (meaning this test can be used) for the duration of the  COVID-19 declaration under Section 564(b)(1) of the Act, 21 U.S.C. section 360bbb-3(b)(1), unless the authorization is terminated or revoked sooner. Performed at [facility], [place] [address] [place], [place] [address]   Blood Culture (routine x 2)  Status: None (Preliminary result)   Collection Time: 02/15/19  4:41 PM   Specimen: BLOOD  Result Value Ref Range Status   Specimen Description   Final    BLOOD LEFT ANTECUBITAL Performed at [place] [address], [place], [place] [address]    Special Requests   Final    BOTTLES DRAWN AEROBIC AND ANAEROBIC Blood Culture adequate volume Performed at [place] [address], [place], [place] [address]    Culture   Final    NO GROWTH < 12 HOURS  Performed at [place] [address], [place], [place] [address]    Report Status PENDING  Incomplete  Blood Culture (routine x 2)     Status: None (Preliminary result)   Collection Time: 02/15/19  5:00 PM   Specimen: BLOOD  Result Value Ref Range Status   Specimen Description   Final    BLOOD RIGHT ANTECUBITAL Performed at [place] [address], [place], [place] [address]    Special Requests   Final    BOTTLES DRAWN AEROBIC AND ANAEROBIC Blood Culture adequate volume Performed at [place] [address] [place], [place] [address]    Culture   Final    NO GROWTH < 24 HOURS Performed at [place] [address], [place], [place] [address]    Report Status PENDING  Incomplete         Radiology Studies: Dg Chest Port 1 View  Result Date: 02/15/2019 CLINICAL DATA:  Short of breath chest pain COVID EXAM: PORTABLE CHEST 1 VIEW COMPARISON:  02/12/2019 FINDINGS: Interim development of patchy airspace disease within the bilateral mid to lower lung zones. No pleural effusion. Stable cardiomediastinal silhouette. No pneumothorax. Age indeterminate suspected fracture deformity at the proximal right humerus. IMPRESSION: 1. Interim development of bilateral airspace disease in the mid to lower lungs compatible with multifocal pneumonia 2. Age indeterminate deformity of the proximal right humerus. Dedicated right shoulder radiographs may be obtained if clinically warranted. Electronically Signed   By: Donavan Foil M.D.   On: 02/15/2019 16:47        Scheduled Meds: . apraclonidine  1 drop Right Eye BID  . aspirin  81 mg Oral BID  . atorvastatin  40 mg Oral QPM  . enalapril  2.5 mg Oral Daily  . enoxaparin (LOVENOX) injection  40 mg Subcutaneous Q24H  . famotidine  20 mg Oral Daily  . furosemide  20 mg Oral Daily  . insulin aspart  0-5 Units Subcutaneous QHS  . insulin aspart  0-9 Units Subcutaneous TID WC  .  Ipratropium-Albuterol  1 puff Inhalation Q6H  . latanoprost  1 drop Right Eye QHS  . methylPREDNISolone (SOLU-MEDROL) injection  40 mg Intravenous Q8H  . mirabegron ER  50 mg Oral Daily  . sodium chloride flush  3 mL Intravenous Q12H  . sodium chloride flush  3 mL Intravenous Q12H  . timolol  1 drop Right Eye BID   Continuous Infusions: . sodium chloride    . remdesivir 100 mg in NS 250 mL       LOS: 2 days   Time spent=35 mins     Arsenio Loader, MD [facility]  If 7PM-7AM, please contact night-coverage  02/17/2019, 7:23 AM

## 2019-02-17 NOTE — Progress Notes (Signed)
Inpatient Diabetes Program Recommendations  AACE/ADA: New Consensus Statement on Inpatient Glycemic Control (2015)  Target Ranges:  Prepandial:   less than 140 mg/dL      Peak postprandial:   less than 180 mg/dL (1-2 hours)      Critically ill patients:  140 - 180 mg/dL   Lab Results  Component Value Date   GLUCAP 249 (H) 02/16/2019   HGBA1C 6.9 (H) 06/12/2018  Results for Lindsey, BREAM "JANICE" (MRN [id number]) as of 02/17/2019 10:11  Ref. Range 06/16/2018 13:42 06/16/2018 16:15 02/16/2019 07:59 02/16/2019 16:11 02/16/2019 21:31  Glucose-Capillary Latest Ref Range: 70 - 99 mg/dL 129 (H) 142 (H) 194 (H) 308 (H) 249 (H)    Review of Glycemic Control  Diabetes history: type 2 Outpatient Diabetes medications: Metformin XR 1000 mg daily,Januvia 100 mg daily Current orders for Inpatient glycemic control: Novolog SENSITIVE correction scale TID & HS.  Inpatient Diabetes Program Recommendations:   Noted that blood sugars are starting to be greater than 180 mg/dl.  Recommend starting low dose basal insulin weight based (75.6 kg X 0.15 units/kg=11.34 units) Lantus 12 units daily and continuing Novolog SENSITIVE correction scale TID & HS. Titrate dosages as needed.   Harvel Ricks RN BSN CDE Diabetes Coordinator Pager: [phone]  8am-5pm

## 2019-02-18 LAB — COMPREHENSIVE METABOLIC PANEL
ALT: 43 U/L (ref 0–44)
AST: 60 U/L — ABNORMAL HIGH (ref 15–41)
Albumin: 3.4 g/dL — ABNORMAL LOW (ref 3.5–5.0)
Alkaline Phosphatase: 44 U/L (ref 38–126)
Anion gap: 11 (ref 5–15)
BUN: 30 mg/dL — ABNORMAL HIGH (ref 8–23)
CO2: 21 mmol/L — ABNORMAL LOW (ref 22–32)
Calcium: 8.7 mg/dL — ABNORMAL LOW (ref 8.9–10.3)
Chloride: 110 mmol/L (ref 98–111)
Creatinine, Ser: 0.93 mg/dL (ref 0.44–1.00)
GFR calc Af Amer: >60 mL/min (ref >60)
GFR calc non Af Amer: >60 mL/min (ref >60)
Glucose, Bld: 237 mg/dL — ABNORMAL HIGH (ref 70–99)
Potassium: 4.3 mmol/L (ref 3.5–5.1)
Sodium: 142 mmol/L (ref 135–145)
Total Bilirubin: 0.8 mg/dL (ref 0.3–1.2)
Total Protein: 6.6 g/dL (ref 6.5–8.1)

## 2019-02-18 LAB — CBC WITH DIFFERENTIAL/PLATELET
Abs Immature Granulocytes: 0.15 10*3/uL — ABNORMAL HIGH (ref 0.00–0.07)
Basophils Absolute: 0 10*3/uL (ref 0.0–0.1)
Basophils Relative: 0 %
Eosinophils Absolute: 0 10*3/uL (ref 0.0–0.5)
Eosinophils Relative: 0 %
HCT: 44.7 % (ref 36.0–46.0)
Hemoglobin: 14.4 g/dL (ref 12.0–15.0)
Immature Granulocytes: 1 %
Lymphocytes Relative: 16 %
Lymphs Abs: 1.8 10*3/uL (ref 0.7–4.0)
MCH: 29.9 pg (ref 26.0–34.0)
MCHC: 32.2 g/dL (ref 30.0–36.0)
MCV: 92.7 fL (ref 80.0–100.0)
Monocytes Absolute: 0.7 10*3/uL (ref 0.1–1.0)
Monocytes Relative: 7 %
Neutro Abs: 8.6 10*3/uL — ABNORMAL HIGH (ref 1.7–7.7)
Neutrophils Relative %: 76 %
Platelets: 218 10*3/uL (ref 150–400)
RBC: 4.82 MIL/uL (ref 3.87–5.11)
RDW: 13.6 % (ref 11.5–15.5)
WBC: 11.3 10*3/uL — ABNORMAL HIGH (ref 4.0–10.5)
nRBC: 0 % (ref 0.0–0.2)

## 2019-02-18 LAB — FERRITIN: Ferritin: 962 ng/mL — ABNORMAL HIGH (ref 11–307)

## 2019-02-18 LAB — GLUCOSE, CAPILLARY
Glucose-Capillary: 291 mg/dL — ABNORMAL HIGH (ref 70–99)
Glucose-Capillary: 257 mg/dL — ABNORMAL HIGH (ref 70–99)
Glucose-Capillary: 195 mg/dL — ABNORMAL HIGH (ref 70–99)
Glucose-Capillary: 244 mg/dL — ABNORMAL HIGH (ref 70–99)

## 2019-02-18 LAB — MAGNESIUM: Magnesium: 2.7 mg/dL — ABNORMAL HIGH (ref 1.7–2.4)

## 2019-02-18 LAB — D-DIMER, QUANTITATIVE: D-Dimer, Quant: 1.44 ug/mL-FEU — ABNORMAL HIGH (ref 0.00–0.50)

## 2019-02-18 LAB — C-REACTIVE PROTEIN: CRP: 5 mg/dL — ABNORMAL HIGH (ref <1.0)

## 2019-02-18 MED ORDER — HYDROCOD POLST-CPM POLST ER 10-8 MG/5ML PO SUER
5.0000 mL | Freq: Two times a day (BID) | ORAL | Status: DC
  Administered 2019-02-18 – 2019-03-04 (×25): 5 mL via ORAL
  Filled 2019-02-18 (×26): qty 5

## 2019-02-18 MED ORDER — DEXAMETHASONE SODIUM PHOSPHATE 10 MG/ML IJ SOLN
6.0000 mg | INTRAMUSCULAR | Status: DC
  Administered 2019-02-18 – 2019-02-24 (×7): 6 mg via INTRAVENOUS
  Filled 2019-02-18 (×7): qty 1

## 2019-02-18 NOTE — Progress Notes (Addendum)
PROGRESS NOTE    Lindsey Baldwin  [id number] DOB: 08-Aug-1945 DOA: 02/15/2019 PCP: Donald Prose, MD    Brief Narrative:  73 year old female who presented with dyspnea.  She does have significant past medical history for hypertension, type 2 diabetes mellitus, GERD, and obstructive sleep apnea.  Patient reported 7 days of dyspnea, evaluated in the emergency department November 10, and then November 13, on her second ED visit she was diagnosed with COVID-19 and discharged to assisted living facility.  Unfortunately her symptoms continue to deteriorate, he developed hypoxemia and persistent fever.  On her initial physical examination her oximetry was 86% on room air, blood pressure 152/70, pulse rate 86, respirate 26, temperature 98.4, she had dry mucous membranes, her lungs were clear to auscultation bilaterally, heart S1-S2 present rhythmic, abdomen soft, no lower extremity edema. Sodium 139, potassium 4.3, chloride 106, bicarb 24, glucose 157, BUN 26, creatinine 0.98, white count 4.6, hemoglobin 13.5, hematocrit 42.8, platelets 132.  SARS COVID-19 was positive.  Chest radiograph with bilateral interstitial infiltrates, at bases and right upper lobe.  EKG 82 bpm, normal axis, sinus rhythm, no ST segment T wave changes.  Patient was admitted to the hospital with the working diagnosis of acute hypoxic respiratory failure due to SARS COVID-19 viral pneumonia.  Patient was admitted to the progressive care unit, she had persistent high oxygen requirements, high flow nasal cannula, 13 L/min. Received treatment with remdesivir and systemic steroids.  November 16 had convalescent plasma and November 17, Actemra.    Assessment & Plan:   Active Problems:   Pneumonia due to COVID-19 virus   Acute respiratory failure due to COVID-19 ([place])   DM2 (diabetes mellitus, type 2) ([facility])   Essential hypertension   Hyperlipemia   OSA on CPAP  1.  Acute hypoxic respiratory failure due to SARS COVID-19 viral  pneumonia.  RR: 15  Pulse oxymetry:92%  Fi02: 100%, 15 LPM HFNC and non rebreather mask.   COVID-19 Labs  Recent Labs    02/15/19 1726  02/16/19 0920 02/17/19 0105 02/18/19 0035  DDIMER 1.09*  --  1.45* 1.32* 1.44*  FERRITIN  --    < > 911* 1,016* 962*  LDH 335*  --   --   --   --   CRP  --    < > 12.6* 10.4* 5.0*   < > = values in this interval not displayed.    Lab Results  Component Value Date   Whitney (A) 02/13/2019   [place] Not Detected 01/31/2019   Continue to have elevated inflammatory markers and increased oxygen requirements.  Continue medical therapy with remdesivir #3/5 and systemic corticosteroids with dexamethasone. Sp convalescent plasma and actemra. Encourage for prone postion and continue airway clering techniques with incentive spirometer and flutter valve. Antitussive with guaifenesin DM, and chlorpheniramine. Bronchodilator therapy. Target oxygen saturation more than 88%.   Patient continue to be at high risk for worsening respiratory failure.    2. T2DM with steroid induced hyperglycemia. Fasting glucose this am 237, capillary is 291 and 257. Will continue basal insulin and sliding scale for glucose cover and monitoring.   3. HTN. Continue blood pressure control with enalapril and furosemide.  4. Dyslipidemia. Continue statin therapy.  5. Obesity. Calculated BMI is 30,4.   DVT prophylaxis: enoxaparin   Code Status:  full Family Communication: no family at the bedside  Disposition Plan/ discharge barriers: pending clinical improvement,   Body mass index is 30.48 kg/m. Malnutrition Type:      Malnutrition  Characteristics:      Nutrition Interventions:     RN Pressure Injury Documentation:     Consultants:     Procedures:     Antimicrobials:       Subjective: Patient continue to have significant dyspnea, has been using incentive spirometer and prone as tolerated, poor oral intake, no chest pain, no  nausea or vomiting.   Objective: Vitals:   02/17/19 1601 02/17/19 2041 02/18/19 0529 02/18/19 0741  BP: (!) 148/76 (!) 144/82 (!) 139/59 (!) 147/71  Pulse: 74 88 76 85  Resp: (!) 22 (!) 23 19 (!) 22  Temp: 98.3 F (36.8 C) 99.2 F (37.3 C) 97.8 F (36.6 C) 98.1 F (36.7 C)  TempSrc: Oral Axillary Axillary Oral  SpO2:  93% 96% 92%  Weight:      Height:        Intake/Output Summary (Last 24 hours) at 02/18/2019 0947 Last data filed at 02/18/2019 0300 Gross per 24 hour  Intake 569.53 ml  Output -  Net 569.53 ml   Filed Weights   02/15/19 2300  Weight: 75.6 kg    Examination:   General: positive dyspnea, deconditioned  Neurology: Awake and alert, non focal  E ENT: positive  pallor, no icterus, oral mucosa moist Cardiovascular: No JVD. S1-S2 present, rhythmic, no gallops, rubs, or murmurs. No lower extremity edema. Pulmonary: positive breath sounds bilaterally.  Gastrointestinal. Abdomen with no organomegaly, non tender, no rebound or guarding Skin. No rashes/ mild cyanosis.  Musculoskeletal: no joint deformities     Data Reviewed: I have personally reviewed following labs and imaging studies  CBC: Recent Labs  Lab 02/12/19 0124 02/15/19 1726 02/17/19 0105 02/18/19 0035  WBC 5.6 4.6 7.8 11.3*  NEUTROABS 4.0 3.1 6.0 8.6*  HGB 13.2 13.5 13.7 14.4  HCT 40.2 42.8 43.7 44.7  MCV 93.1 94.1 93.6 92.7  PLT 127* 132* 165 [id number]   Basic Metabolic Panel: Recent Labs  Lab 02/12/19 0124 02/15/19 1726 02/16/19 0920 02/17/19 0105 02/18/19 0035  NA 137 139 139 140 142  K 4.2 4.3 4.4 4.6 4.3  CL 105 106 108 106 110  CO2 21* 24 20* 20* 21*  GLUCOSE 200* 157* 231* 257* 237*  BUN 19 26* 19 25* 30*  CREATININE 1.00 0.98 0.78 0.83 0.93  CALCIUM 9.0 8.7* 8.3* 8.6* 8.7*  MG  --   --   --  2.4 2.7*   GFR: Estimated Creatinine Clearance: 52.8 mL/min (by C-G formula based on SCr of 0.93 mg/dL). Liver Function Tests: Recent Labs  Lab 02/15/19 1726 02/16/19 0920  02/17/19 0105 02/18/19 0035  AST 93* 80* 71* 60*  ALT 58* 49* 44 43  ALKPHOS 39 35* 38 44  BILITOT 0.6 0.5 0.7 0.8  PROT 7.2 6.7 6.7 6.6  ALBUMIN 3.7 3.2* 3.2* 3.4*   No results for input(s): LIPASE, AMYLASE in the last 168 hours. No results for input(s): AMMONIA in the last 168 hours. Coagulation Profile: No results for input(s): INR, PROTIME in the last 168 hours. Cardiac Enzymes: No results for input(s): CKTOTAL, CKMB, CKMBINDEX, TROPONINI in the last 168 hours. BNP (last 3 results) No results for input(s): PROBNP in the last 8760 hours. HbA1C: No results for input(s): HGBA1C in the last 72 hours. CBG: Recent Labs  Lab 02/17/19 1135 02/17/19 1612 02/17/19 1646 02/17/19 2130 02/18/19 0802  GLUCAP 291* 385* 363* 282* 291*   Lipid Profile: Recent Labs    02/15/19 1725  TRIG 163*   Thyroid Function Tests: No results  for input(s): TSH, T4TOTAL, FREET4, T3FREE, THYROIDAB in the last 72 hours. Anemia Panel: Recent Labs    02/17/19 0105 02/18/19 0035  FERRITIN 1,016* 962*      Radiology Studies: I have reviewed all of the imaging during this hospital visit personally     Scheduled Meds: . apraclonidine  1 drop Right Eye BID  . aspirin  81 mg Oral BID  . atorvastatin  40 mg Oral QPM  . enalapril  2.5 mg Oral Daily  . enoxaparin (LOVENOX) injection  40 mg Subcutaneous Q24H  . famotidine  20 mg Oral Daily  . furosemide  20 mg Oral Daily  . insulin aspart  0-5 Units Subcutaneous QHS  . insulin aspart  0-9 Units Subcutaneous TID WC  . insulin glargine  12 Units Subcutaneous Daily  . Ipratropium-Albuterol  1 puff Inhalation Q6H  . latanoprost  1 drop Right Eye QHS  . methylPREDNISolone (SOLU-MEDROL) injection  40 mg Intravenous Q8H  . mirabegron ER  50 mg Oral Daily  . sodium chloride flush  3 mL Intravenous Q12H  . sodium chloride flush  3 mL Intravenous Q12H  . timolol  1 drop Right Eye BID   Continuous Infusions: . sodium chloride    . remdesivir 100  mg in NS 250 mL Stopped (02/17/19 1144)     LOS: 3 days        Kenijah Benningfield Gerome Apley, MD

## 2019-02-18 NOTE — Plan of Care (Signed)
MD on unit and made aware of 02 sats  85-90% on 15L NRB and 15L HFNC. No new orders received. Pt. Remains alert and oriented. No respiratory distress noted.

## 2019-02-18 NOTE — Progress Notes (Signed)
Results for ELIAHNA, STIGERS (MRN [id number]) as of 02/18/2019 11:32  Ref. Range 02/17/2019 11:35 02/17/2019 16:12 02/17/2019 16:46 02/17/2019 21:30 02/18/2019 08:02  Glucose-Capillary Latest Ref Range: 70 - 99 mg/dL 291 (H) 385 (H) 363 (H) 282 (H) 291 (H)  Noted that blood sugars continue to be elevated.   Recommend increasing Lantus to 15 units daily, increase Novolog correction scale to RESISTANT TID and HS scale if blood sugars continue to be elevated and while on steroids.   Harvel Ricks RN BSN CDE Diabetes Coordinator Pager: [phone]  8am-5pm

## 2019-02-19 ENCOUNTER — Inpatient Hospital Stay (HOSPITAL_COMMUNITY): Payer: Medicare Other

## 2019-02-19 LAB — COMPREHENSIVE METABOLIC PANEL
ALT: 39 U/L (ref 0–44)
ALT: 52 U/L — ABNORMAL HIGH (ref 0–44)
AST: 49 U/L — ABNORMAL HIGH (ref 15–41)
AST: 92 U/L — ABNORMAL HIGH (ref 15–41)
Albumin: 3 g/dL — ABNORMAL LOW (ref 3.5–5.0)
Albumin: 3.4 g/dL — ABNORMAL LOW (ref 3.5–5.0)
Alkaline Phosphatase: 46 U/L (ref 38–126)
Alkaline Phosphatase: 51 U/L (ref 38–126)
Anion gap: 12 (ref 5–15)
Anion gap: 14 (ref 5–15)
BUN: 33 mg/dL — ABNORMAL HIGH (ref 8–23)
BUN: 34 mg/dL — ABNORMAL HIGH (ref 8–23)
CO2: 22 mmol/L (ref 22–32)
CO2: 27 mmol/L (ref 22–32)
Calcium: 8.8 mg/dL — ABNORMAL LOW (ref 8.9–10.3)
Calcium: 8.5 mg/dL — ABNORMAL LOW (ref 8.9–10.3)
Chloride: 110 mmol/L (ref 98–111)
Chloride: 104 mmol/L (ref 98–111)
Creatinine, Ser: 0.88 mg/dL (ref 0.44–1.00)
Creatinine, Ser: 0.94 mg/dL (ref 0.44–1.00)
GFR calc Af Amer: >60 mL/min (ref >60)
GFR calc Af Amer: >60 mL/min (ref >60)
GFR calc non Af Amer: >60 mL/min (ref >60)
GFR calc non Af Amer: >60 mL/min (ref >60)
Glucose, Bld: 238 mg/dL — ABNORMAL HIGH (ref 70–99)
Glucose, Bld: 200 mg/dL — ABNORMAL HIGH (ref 70–99)
Potassium: 4.2 mmol/L (ref 3.5–5.1)
Potassium: 3.9 mmol/L (ref 3.5–5.1)
Sodium: 144 mmol/L (ref 135–145)
Sodium: 145 mmol/L (ref 135–145)
Total Bilirubin: 0.5 mg/dL (ref 0.3–1.2)
Total Bilirubin: 0.6 mg/dL (ref 0.3–1.2)
Total Protein: 6 g/dL — ABNORMAL LOW (ref 6.5–8.1)
Total Protein: 6.4 g/dL — ABNORMAL LOW (ref 6.5–8.1)

## 2019-02-19 LAB — CBC WITH DIFFERENTIAL/PLATELET
Abs Immature Granulocytes: 0.19 10*3/uL — ABNORMAL HIGH (ref 0.00–0.07)
Basophils Absolute: 0 10*3/uL (ref 0.0–0.1)
Basophils Relative: 0 %
Eosinophils Absolute: 0 10*3/uL (ref 0.0–0.5)
Eosinophils Relative: 0 %
HCT: 45.2 % (ref 36.0–46.0)
Hemoglobin: 14.7 g/dL (ref 12.0–15.0)
Immature Granulocytes: 2 %
Lymphocytes Relative: 18 %
Lymphs Abs: 2.3 10*3/uL (ref 0.7–4.0)
MCH: 29.9 pg (ref 26.0–34.0)
MCHC: 32.5 g/dL (ref 30.0–36.0)
MCV: 92.1 fL (ref 80.0–100.0)
Monocytes Absolute: 0.7 10*3/uL (ref 0.1–1.0)
Monocytes Relative: 6 %
Neutro Abs: 9.2 10*3/uL — ABNORMAL HIGH (ref 1.7–7.7)
Neutrophils Relative %: 74 %
Platelets: 231 10*3/uL (ref 150–400)
RBC: 4.91 MIL/uL (ref 3.87–5.11)
RDW: 13.3 % (ref 11.5–15.5)
WBC: 12.5 10*3/uL — ABNORMAL HIGH (ref 4.0–10.5)
nRBC: 0 % (ref 0.0–0.2)

## 2019-02-19 LAB — GLUCOSE, CAPILLARY
Glucose-Capillary: 221 mg/dL — ABNORMAL HIGH (ref 70–99)
Glucose-Capillary: 157 mg/dL — ABNORMAL HIGH (ref 70–99)
Glucose-Capillary: 269 mg/dL — ABNORMAL HIGH (ref 70–99)
Glucose-Capillary: 92 mg/dL (ref 70–99)

## 2019-02-19 LAB — FERRITIN: Ferritin: 896 ng/mL — ABNORMAL HIGH (ref 11–307)

## 2019-02-19 LAB — C-REACTIVE PROTEIN: CRP: 2 mg/dL — ABNORMAL HIGH (ref <1.0)

## 2019-02-19 LAB — D-DIMER, QUANTITATIVE: D-Dimer, Quant: 1.58 ug/mL-FEU — ABNORMAL HIGH (ref 0.00–0.50)

## 2019-02-19 MED ORDER — INSULIN ASPART 100 UNIT/ML ~~LOC~~ SOLN
0.0000 [IU] | Freq: Three times a day (TID) | SUBCUTANEOUS | Status: DC
  Administered 2019-02-19: 8 [IU] via SUBCUTANEOUS
  Administered 2019-02-20: 3 [IU] via SUBCUTANEOUS
  Administered 2019-02-20: 2 [IU] via SUBCUTANEOUS
  Administered 2019-02-20 – 2019-02-21 (×2): 5 [IU] via SUBCUTANEOUS
  Administered 2019-02-21: 3 [IU] via SUBCUTANEOUS
  Administered 2019-02-21: 5 [IU] via SUBCUTANEOUS
  Administered 2019-02-22: 13:00:00 3 [IU] via SUBCUTANEOUS
  Administered 2019-02-22: 2 [IU] via SUBCUTANEOUS
  Administered 2019-02-22 – 2019-02-23 (×3): 3 [IU] via SUBCUTANEOUS
  Administered 2019-02-23 – 2019-02-24 (×4): 2 [IU] via SUBCUTANEOUS
  Administered 2019-02-25: 3 [IU] via SUBCUTANEOUS
  Administered 2019-02-25: 2 [IU] via SUBCUTANEOUS
  Administered 2019-02-25: 3 [IU] via SUBCUTANEOUS
  Administered 2019-02-26 (×2): 5 [IU] via SUBCUTANEOUS
  Administered 2019-02-27: 3 [IU] via SUBCUTANEOUS
  Administered 2019-02-27: 5 [IU] via SUBCUTANEOUS
  Administered 2019-02-28: 1 [IU] via SUBCUTANEOUS
  Administered 2019-02-28: 3 [IU] via SUBCUTANEOUS
  Administered 2019-02-28: 5 [IU] via SUBCUTANEOUS
  Administered 2019-03-01: 3 [IU] via SUBCUTANEOUS
  Administered 2019-03-01: 12:00:00 5 [IU] via SUBCUTANEOUS
  Administered 2019-03-01: 08:00:00 2 [IU] via SUBCUTANEOUS
  Administered 2019-03-02: 3 [IU] via SUBCUTANEOUS
  Administered 2019-03-02: 2 [IU] via SUBCUTANEOUS
  Administered 2019-03-02: 3 [IU] via SUBCUTANEOUS
  Administered 2019-03-03: 5 [IU] via SUBCUTANEOUS
  Administered 2019-03-03: 3 [IU] via SUBCUTANEOUS
  Administered 2019-03-03: 2 [IU] via SUBCUTANEOUS
  Administered 2019-03-04: 3 [IU] via SUBCUTANEOUS
  Administered 2019-03-04: 5 [IU] via SUBCUTANEOUS
  Administered 2019-03-04: 2 [IU] via SUBCUTANEOUS
  Administered 2019-03-05 (×3): 3 [IU] via SUBCUTANEOUS
  Administered 2019-03-06: 5 [IU] via SUBCUTANEOUS
  Administered 2019-03-06: 3 [IU] via SUBCUTANEOUS
  Administered 2019-03-06: 2 [IU] via SUBCUTANEOUS
  Administered 2019-03-07 – 2019-03-08 (×4): 3 [IU] via SUBCUTANEOUS
  Administered 2019-03-08: 8 [IU] via SUBCUTANEOUS
  Administered 2019-03-09: 2 [IU] via SUBCUTANEOUS
  Administered 2019-03-09: 3 [IU] via SUBCUTANEOUS
  Administered 2019-03-09: 2 [IU] via SUBCUTANEOUS
  Administered 2019-03-10: 5 [IU] via SUBCUTANEOUS
  Administered 2019-03-10 (×2): 3 [IU] via SUBCUTANEOUS
  Administered 2019-03-11: 2 [IU] via SUBCUTANEOUS
  Administered 2019-03-11: 3 [IU] via SUBCUTANEOUS
  Administered 2019-03-11: 6 [IU] via SUBCUTANEOUS
  Administered 2019-03-12 (×3): 3 [IU] via SUBCUTANEOUS
  Administered 2019-03-13: 2 [IU] via SUBCUTANEOUS
  Administered 2019-03-13: 3 [IU] via SUBCUTANEOUS

## 2019-02-19 MED ORDER — SODIUM CHLORIDE 0.9 % IV SOLN
1.0000 g | INTRAVENOUS | Status: AC
  Administered 2019-02-19 – 2019-02-26 (×8): 1 g via INTRAVENOUS
  Filled 2019-02-19 (×9): qty 10

## 2019-02-19 MED ORDER — INSULIN GLARGINE 100 UNIT/ML ~~LOC~~ SOLN
15.0000 [IU] | Freq: Every day | SUBCUTANEOUS | Status: DC
  Administered 2019-02-19 – 2019-02-27 (×9): 15 [IU] via SUBCUTANEOUS
  Filled 2019-02-19 (×9): qty 0.15

## 2019-02-19 MED ORDER — INSULIN GLARGINE 100 UNIT/ML ~~LOC~~ SOLN: 15.0000 [IU] | Freq: Every day | SUBCUTANEOUS | Status: DC

## 2019-02-19 MED ORDER — ORAL CARE MOUTH RINSE
15.0000 mL | Freq: Two times a day (BID) | OROMUCOSAL | Status: DC
  Administered 2019-02-19 – 2019-03-13 (×41): 15 mL via OROMUCOSAL

## 2019-02-19 MED ORDER — CEFTRIAXONE SODIUM 1 G IJ SOLR: 1.0000 g | INTRAMUSCULAR | Status: DC

## 2019-02-19 NOTE — Progress Notes (Signed)
Inpatient Diabetes Program Recommendations  AACE/ADA: New Consensus Statement on Inpatient Glycemic Control (2015)  Target Ranges:  Prepandial:   less than 140 mg/dL      Peak postprandial:   less than 180 mg/dL (1-2 hours)      Critically ill patients:  140 - 180 mg/dL   Lab Results  Component Value Date   GLUCAP 221 (H) 02/19/2019   HGBA1C 6.9 (H) 06/12/2018    Results for Lindsey Baldwin, Lindsey Baldwin" (MRN [id number]) as of 02/19/2019 09:35  Ref. Range 02/18/2019 08:02 02/18/2019 11:39 02/18/2019 16:06 02/18/2019 20:21 02/19/2019 07:50  Glucose-Capillary Latest Ref Range: 70 - 99 mg/dL 291 (H) 257 (H) 195 (H) 244 (H) 221 (H)   Review of Glycemic Control  Diabetes history: DM 2 Outpatient Diabetes medications: Metformin XR 1000 mg daily, Januvia 100 mg daily  Current orders for Inpatient glycemic control:  Lantus 12 units Novolog 0-9 units tid + hs  Noted Solumedrol transitioned to Decadron 6 mg Daily yesterday BUN/Creat: 33/0.88 No PO supplementation  Inpatient Diabetes Program Recommendations:    Consider Lantus 15 units Consider Novolog 0-15 units tid  Thanks,  Tama Headings RN, MSN, BC-ADM Inpatient Diabetes Coordinator Team Pager [phone] (8a-5p)

## 2019-02-19 NOTE — Progress Notes (Signed)
PROGRESS NOTE    Lindsey Baldwin  [id number] DOB: [birth year] DOA: 02/15/2019 PCP: Donald Prose, MD    Brief Narrative:  74 year old female who presented with dyspnea.  She does have significant past medical history for hypertension, type 2 diabetes mellitus, GERD, and obstructive sleep apnea.  Patient reported 7 days of dyspnea, evaluated in the emergency department November 10, and then November 13, on her second ED visit she was diagnosed with COVID-19 and discharged to assisted living facility.  Unfortunately her symptoms continue to deteriorate, he developed hypoxemia and persistent fever.  On her initial physical examination her oximetry was 86% on room air, blood pressure 152/70, pulse rate 86, respirate 26, temperature 98.4, she had dry mucous membranes, her lungs were clear to auscultation bilaterally, heart S1-S2 present rhythmic, abdomen soft, no lower extremity edema. Sodium 139, potassium 4.3, chloride 106, bicarb 24, glucose 157, BUN 26, creatinine 0.98, white count 4.6, hemoglobin 13.5, hematocrit 42.8, platelets 132.  SARS COVID-19 was positive.  Chest radiograph with bilateral interstitial infiltrates, at bases and right upper lobe.  EKG 82 bpm, normal axis, sinus rhythm, no ST segment T wave changes.  Patient was admitted to the hospital with the working diagnosis of acute hypoxic respiratory failure due to SARS COVID-19 viral pneumonia.  Patient was admitted to the progressive care unit, she had persistent high oxygen requirements, high flow nasal cannula, 13 L/min. Received treatment with remdesivir and systemic steroids.  November 16 had convalescent plasma and November 17, Actemra.   Patient continue to have a high oxygen requirements, follow chest film with dense infiltrate on the left upper lobe with positive air bronchogram. Added antibiotic therapy with ceftriaxone for suspected bacterial over-infection.    Assessment & Plan:   Active Problems:   Pneumonia due  to COVID-19 virus   Acute respiratory failure due to COVID-19 ([place])   DM2 (diabetes mellitus, type 2) (HCC)   Essential hypertension   Hyperlipemia   OSA on CPAP  1.  Acute hypoxic respiratory failure due to SARS COVID-19 viral pneumonia. Sp convalescent plasma and actemra 02/17/19.    RR: 19   Pulse oxymetry: 92% Fi02: 100%, 15 LPM per HFNC and non rebrether.  COVID-19 Labs  Recent Labs    02/17/19 0105 02/18/19 0035 02/19/19 0051  DDIMER 1.32* 1.44* 1.58*  FERRITIN 1,016* 962* 896*  CRP 10.4* 5.0* 2.0*    Lab Results  Component Value Date   SARSCOV2NAA POSITIVE (A) 02/13/2019   [place] Not Detected 01/31/2019    Inflammatory markers are trending down. But patient continue to have increased oxygen requirements.   Medical therapy with remdesivir #5/5 (AST 49 and ALT 39). Continue with systemic corticosteroids with dexamethasone. On antitussive with guaifenesin DM, and chlorpheniramine, and bronchodilator therapy.   Dense infiltrate on the left upper lobe, personally reviewed chest radiograph, will add IV Ceftriaxone (suspected bacterial over- infection) and will encourage prone and right lateral decubitus position.  Out of bed to chair and physical therapy evaluation.   Patient allergic to penicillin with skin hives and pruritis more than 10 years ago, will continue with cephalosporin therapy with close monitoring.   Patient continue to be at high risk for worsening respiratory failure.    2. T2DM with steroid induced hyperglycemia. Fasting glucose this am 238 capillary is 221. Will add basal insulin 15 units with lantus, and will continue insulin sliding scale with moderate insulin sensitivity for glucose cover and monitoring.   3. HTN. Blood pressure 143/65, continue with enalapril for blood pressure  control, will hold on furosemide for now.   4. Dyslipidemia. On statin  5. Obesity. Calculated BMI is 30,4. Will need outpatient follow up.   DVT  prophylaxis: enoxaparin   Code Status:  full Family Communication: no family at the bedside  Disposition Plan/ discharge barriers: pending clinical improvement,     Body mass index is 30.48 kg/m. Malnutrition Type:     1.  Acute hypoxic respiratory failure due to SARS COVID-19 viral pneumonia.  RR: 19  Pulse oxymetry: 92%  Fi02: 100% 15 LPM HFNC and non-rebreather mask.   COVID-19 Labs  Recent Labs    02/17/19 0105 02/18/19 0035 02/19/19 0051  DDIMER 1.32* 1.44* 1.58*  FERRITIN 1,016* 962* 896*  CRP 10.4* 5.0* 2.0*    Lab Results  Component Value Date   SARSCOV2NAA POSITIVE (A) 02/13/2019   [place] Not Detected 01/31/2019      Malnutrition Characteristics:      Nutrition Interventions:     RN Pressure Injury Documentation:     Consultants:     Procedures:     Antimicrobials:   IV ceftriaxone.     Subjective: Patient is feeling better but continue to have dyspnea with minimal efforts, continue with high oxygen requirements, prone and lateral decubitus as tolerated. No nausea or vomiting, poor oral intake.   Objective: Vitals:   02/18/19 2100 02/18/19 2200 02/19/19 0434 02/19/19 0755  BP:   137/62 (!) 143/65  Pulse:    69  Resp: (!) 24 (!) 24 (!) 22 19  Temp:   98.3 F (36.8 C) 98.4 F (36.9 C)  TempSrc:   Axillary Oral  SpO2:   93% 92%  Weight:      Height:        Intake/Output Summary (Last 24 hours) at 02/19/2019 0825 Last data filed at 02/18/2019 1800 Gross per 24 hour  Intake 1330 ml  Output -  Net 1330 ml   Filed Weights   02/15/19 2300  Weight: 75.6 kg    Examination:   General: positive dyspnea at rest, deconditioned  Neurology: Awake and alert, non focal  E ENT: positive pallor, no icterus, oral mucosa moist Cardiovascular: No JVD. S1-S2 present, rhythmic, no gallops, rubs, or murmurs. No lower extremity edema. Pulmonary: positive breath sounds bilaterally. Gastrointestinal. Abdomen with no organomegaly,  non tender, no rebound or guarding Skin. No rashes Musculoskeletal: no joint deformities     Data Reviewed: I have personally reviewed following labs and imaging studies  CBC: Recent Labs  Lab 02/15/19 1726 02/17/19 0105 02/18/19 0035  WBC 4.6 7.8 11.3*  NEUTROABS 3.1 6.0 8.6*  HGB 13.5 13.7 14.4  HCT 42.8 43.7 44.7  MCV 94.1 93.6 92.7  PLT 132* 165 [id number]   Basic Metabolic Panel: Recent Labs  Lab 02/15/19 1726 02/16/19 0920 02/17/19 0105 02/18/19 0035 02/19/19 0051  NA 139 139 140 142 144  K 4.3 4.4 4.6 4.3 4.2  CL 106 108 106 110 110  CO2 24 20* 20* 21* 22  GLUCOSE 157* 231* 257* 237* 238*  BUN 26* 19 25* 30* 33*  CREATININE 0.98 0.78 0.83 0.93 0.88  CALCIUM 8.7* 8.3* 8.6* 8.7* 8.8*  MG  --   --  2.4 2.7*  --    GFR: Estimated Creatinine Clearance: 55.8 mL/min (by C-G formula based on SCr of 0.88 mg/dL). Liver Function Tests: Recent Labs  Lab 02/15/19 1726 02/16/19 0920 02/17/19 0105 02/18/19 0035 02/19/19 0051  AST 93* 80* 71* 60* 49*  ALT 58* 49* 44 43  39  ALKPHOS 39 35* 38 44 46  BILITOT 0.6 0.5 0.7 0.8 0.5  PROT 7.2 6.7 6.7 6.6 6.0*  ALBUMIN 3.7 3.2* 3.2* 3.4* 3.0*   No results for input(s): LIPASE, AMYLASE in the last 168 hours. No results for input(s): AMMONIA in the last 168 hours. Coagulation Profile: No results for input(s): INR, PROTIME in the last 168 hours. Cardiac Enzymes: No results for input(s): CKTOTAL, CKMB, CKMBINDEX, TROPONINI in the last 168 hours. BNP (last 3 results) No results for input(s): PROBNP in the last 8760 hours. HbA1C: No results for input(s): HGBA1C in the last 72 hours. CBG: Recent Labs  Lab 02/18/19 0802 02/18/19 1139 02/18/19 1606 02/18/19 2021 02/19/19 0750  GLUCAP 291* 257* 195* 244* 221*   Lipid Profile: No results for input(s): CHOL, HDL, LDLCALC, TRIG, CHOLHDL, LDLDIRECT in the last 72 hours. Thyroid Function Tests: No results for input(s): TSH, T4TOTAL, FREET4, T3FREE, THYROIDAB in the last 72  hours. Anemia Panel: Recent Labs    02/18/19 0035 02/19/19 0051  FERRITIN 962* 896*      Radiology Studies: I have reviewed all of the imaging during this hospital visit personally     Scheduled Meds: . apraclonidine  1 drop Right Eye BID  . aspirin  81 mg Oral BID  . atorvastatin  40 mg Oral QPM  . chlorpheniramine-HYDROcodone  5 mL Oral Q12H  . dexamethasone (DECADRON) injection  6 mg Intravenous Q24H  . enalapril  2.5 mg Oral Daily  . enoxaparin (LOVENOX) injection  40 mg Subcutaneous Q24H  . famotidine  20 mg Oral Daily  . furosemide  20 mg Oral Daily  . insulin aspart  0-5 Units Subcutaneous QHS  . insulin aspart  0-9 Units Subcutaneous TID WC  . insulin glargine  12 Units Subcutaneous Daily  . Ipratropium-Albuterol  1 puff Inhalation Q6H  . latanoprost  1 drop Right Eye QHS  . mouth rinse  15 mL Mouth Rinse BID  . mirabegron ER  50 mg Oral Daily  . sodium chloride flush  3 mL Intravenous Q12H  . sodium chloride flush  3 mL Intravenous Q12H  . timolol  1 drop Right Eye BID   Continuous Infusions: . sodium chloride    . remdesivir 100 mg in NS 250 mL 100 mg (02/18/19 1316)     LOS: 4 days        Mauricio Gerome Apley, MD

## 2019-02-19 NOTE — Progress Notes (Signed)
I spoke over the phone with the patient's friend about patient's  condition, plan of care and all questions were addressed.

## 2019-02-20 LAB — CBC WITH DIFFERENTIAL/PLATELET
Abs Immature Granulocytes: 0.15 K/uL — ABNORMAL HIGH (ref 0.00–0.07)
Basophils Absolute: 0.1 K/uL (ref 0.0–0.1)
Basophils Relative: 1 %
Eosinophils Absolute: 0.1 K/uL (ref 0.0–0.5)
Eosinophils Relative: 1 %
HCT: 46.6 % — ABNORMAL HIGH (ref 36.0–46.0)
Hemoglobin: 14.7 g/dL (ref 12.0–15.0)
Immature Granulocytes: 1 %
Lymphocytes Relative: 22 %
Lymphs Abs: 2.9 K/uL (ref 0.7–4.0)
MCH: 30.1 pg (ref 26.0–34.0)
MCHC: 31.5 g/dL (ref 30.0–36.0)
MCV: 95.3 fL (ref 80.0–100.0)
Monocytes Absolute: 0.7 K/uL (ref 0.1–1.0)
Monocytes Relative: 6 %
Neutro Abs: 9.1 K/uL — ABNORMAL HIGH (ref 1.7–7.7)
Neutrophils Relative %: 69 %
Platelets: 251 K/uL (ref 150–400)
RBC: 4.89 MIL/uL (ref 3.87–5.11)
RDW: 13.7 % (ref 11.5–15.5)
WBC: 13.1 K/uL — ABNORMAL HIGH (ref 4.0–10.5)
nRBC: 0 % (ref 0.0–0.2)

## 2019-02-20 LAB — FERRITIN: Ferritin: 802 ng/mL — ABNORMAL HIGH (ref 11–307)

## 2019-02-20 LAB — CULTURE, BLOOD (ROUTINE X 2)
Culture: NO GROWTH
Culture: NO GROWTH
Special Requests: ADEQUATE
Special Requests: ADEQUATE

## 2019-02-20 LAB — GLUCOSE, CAPILLARY
Glucose-Capillary: 216 mg/dL — ABNORMAL HIGH (ref 70–99)
Glucose-Capillary: 209 mg/dL — ABNORMAL HIGH (ref 70–99)
Glucose-Capillary: 136 mg/dL — ABNORMAL HIGH (ref 70–99)
Glucose-Capillary: 176 mg/dL — ABNORMAL HIGH (ref 70–99)

## 2019-02-20 LAB — C-REACTIVE PROTEIN: CRP: 1.2 mg/dL — ABNORMAL HIGH

## 2019-02-20 LAB — D-DIMER, QUANTITATIVE: D-Dimer, Quant: 2.69 ug/mL-FEU — ABNORMAL HIGH (ref 0.00–0.50)

## 2019-02-20 MED ORDER — ZOLPIDEM TARTRATE 5 MG PO TABS
5.0000 mg | ORAL_TABLET | Freq: Every evening | ORAL | Status: DC | PRN
  Administered 2019-02-20 – 2019-03-11 (×12): 5 mg via ORAL
  Filled 2019-02-20 (×12): qty 1

## 2019-02-20 NOTE — Progress Notes (Signed)
Inpatient Diabetes Program Recommendations  AACE/ADA: New Consensus Statement on Inpatient Glycemic Control (2015)  Target Ranges:  Prepandial:   less than 140 mg/dL      Peak postprandial:   less than 180 mg/dL (1-2 hours)      Critically ill patients:  140 - 180 mg/dL   Lab Results  Component Value Date   GLUCAP 209 (H) 02/20/2019   HGBA1C 6.9 (H) 06/12/2018     Review of Glycemic Control  Diabetes history: DM 2 Outpatient Diabetes medications: Metformin XR 1000 mg daily, Januvia 100 mg daily  Current orders for Inpatient glycemic control:  Lantus 15 units Novolog 0-15 units tid  Decadron 6 mg Daily BUN/Creat: 34/0.94 No PO supplementation  Inpatient Diabetes Program Recommendations:    Consider Lantus 20 units Consider Novolog 4 units tid meal coverage if eating >50% Consider adding Novolog hs scale  Thanks,  Tama Headings RN, MSN, BC-ADM Inpatient Diabetes Coordinator Team Pager [phone] (8a-5p)

## 2019-02-20 NOTE — Progress Notes (Signed)
OT Cancellation Note  Patient Details Name: Shaheerah Legarreta MRN: [id number] DOB: 10/11/1945   Cancelled Treatment:    Reason Eval/Treat Not Completed: Other (comment)(RN requesting to hold due to high oxygen requirements and minimal activity tolerance. Per chart pt on HFNC and Non-rebreather 15L, FiO2 100% with O2 SATs 90%)  Lyman Bishop 02/20/2019, 2:06 PM

## 2019-02-20 NOTE — Progress Notes (Signed)
PT Cancellation Note  Patient Details Name: Lindsey Baldwin MRN: [id number] DOB: [birth year]   Cancelled Treatment:    Reason Eval/Treat Not Completed: Medical issues which prohibited therapy, Per RN patient is only able to tolerate transfers to char and BSC, on NRB and HFNC. Will check back another day.    Claretha Cooper 02/20/2019, 2:13 PM  [place] Pager [phone] Office [phone]

## 2019-02-20 NOTE — Progress Notes (Signed)
PROGRESS NOTE    Lindsey Baldwin  [id number] DOB: [birth year] DOA: 02/15/2019 PCP: Donald Prose, MD    Brief Narrative:  73 year old female who presented with dyspnea. She does have significant past medical history for hypertension, type 2 diabetes mellitus, GERD, andobstructive sleep apnea. Patient reported 7 days of dyspnea, evaluated in the emergency department November 10,and then November 13, on her second ED visit she was diagnosed with COVID-19 and discharged to assisted living facility. Unfortunately her symptoms continue to deteriorate, he developed hypoxemia and persistent fever. On her initial physical examination her oximetry was 86% on room air,blood pressure 152/70, pulse rate 86, respirate 26, temperature 98.4,she had dry mucous membranes, her lungs were clear to auscultation bilaterally, heart S1-S2 present rhythmic, abdomen soft, no lower extremity edema. Sodium 139, potassium 4.3, chloride 106, bicarb 24, glucose 157, BUN 26, creatinine 0.98, white count 4.6, hemoglobin 13.5, hematocrit 42.8, platelets 132.SARS COVID-19 was positive. Chest radiograph with bilateral interstitial infiltrates, at bases and right upper lobe. EKG 82 bpm, normal axis, sinus rhythm, no ST segment T wave changes.  Patient was admitted to the hospitalwith theworking diagnosis of acute hypoxic respiratory failuredue toSARS COVID-19 viral pneumonia.  Patient was admitted to the progressive care unit, she had persistent high oxygen requirements, high flow nasal cannula, 13 L/min. Received treatment with remdesivir and systemic steroids. November 16 had convalescent plasma andNovember 17, Actemra.  Patient continue to have a high oxygen requirements, follow chest film with dense infiltrate on the left upper lobe with positive air bronchogram. Added antibiotic therapy with ceftriaxone for suspected bacterial over-infection.     Assessment & Plan:   Active Problems:   Pneumonia  due to COVID-19 virus   Acute respiratory failure due to COVID-19 ([place])   DM2 (diabetes mellitus, type 2) (HCC)   Essential hypertension   Hyperlipemia   OSA on CPAP   1.Acute hypoxic respiratory failure due to SARS COVID-19 viral pneumonia. Sp convalescent plasma and actemra 02/17/19.   RR: 22  Pulse oxymetry: 90  Fi02: 100% with 15 LPM HFNC  COVID-19 Labs  Recent Labs    02/18/19 0035 02/19/19 0051 02/20/19 0155 02/20/19 0324  DDIMER 1.44* 1.58*  --  2.69*  FERRITIN 962* 896* 802*  --   CRP 5.0* 2.0* 1.2*  --     Lab Results  Component Value Date   SARSCOV2NAA POSITIVE (A) 02/13/2019   [place] Not Detected 01/31/2019    Patient continue to have high oxygen requirements, inflammatory markers continue to trend down.   She has completed medical therapy with Remdesivir, will continue with systemic corticosteroids with dexamethasone.#6 for now. Continue to encourage prone and right lateral decubitus. Patient receiving antitussive and bronchodilator therapy, airway clearing techniques with flutter valve and incentive spirometer.   She continues to be at high risk for worsening respiratory failure. Out of bed as tolerated tid with meals and physical therapy.   Patient tolerating well antibiotic therapy with IV ceftriaxone for suspected bacterial over infection.   2. T2DM with steroid induced hyperglycemia. Capillary glucose 157l, 269, 92. 209. Poor oral intake, wll continue with 15 units of basal insulin, plus insulin sliding scale for glucose cover and monitoring.  3. HTN. Continue blood pressure control with enalapril. 140 /78 this am.   4. Dyslipidemia. Continue with statin.   5. Obesity.  BMI is 30,4.    DVT prophylaxis:enoxaparin Code Status:full Family Communication:no family at the bedside Disposition Plan/ discharge barriers:pending clinical improvement,     Body mass index is 30.48  kg/m. Malnutrition Type:      Malnutrition  Characteristics:      Nutrition Interventions:     RN Pressure Injury Documentation:     Consultants:     Procedures:     Antimicrobials:   Ceftriaxone     Subjective: Patient continue to have significant dyspnea and high oxygen requirements, mentions improvement in sensation of air hunger. No nausea or vomiting, poor appetite and mobility. Has been in prone positioning at least 3 H at night and on right lateral decubitus.   Objective: Vitals:   02/19/19 1533 02/19/19 1948 02/20/19 0400 02/20/19 0735  BP: 135/68 114/76 135/67 140/78  Pulse: 73   77  Resp: 20 (!) 22  (!) 22  Temp: 98.4 F (36.9 C) 98.3 F (36.8 C) 98.7 F (37.1 C) 99 F (37.2 C)  TempSrc: Oral Axillary Axillary Oral  SpO2: 92% 90%  90%  Weight:      Height:        Intake/Output Summary (Last 24 hours) at 02/20/2019 0756 Last data filed at 02/19/2019 1525 Gross per 24 hour  Intake 1190 ml  Output -  Net 1190 ml   Filed Weights   02/15/19 2300  Weight: 75.6 kg    Examination:   General: positive dyspnea, deconditioned and ill looking appearing. Positive cyanosis in acral regions.  Neurology: Awake and alert, non focal  E ENT: positive pallor, no icterus, oral mucosa moist Cardiovascular: No JVD. S1-S2 present, rhythmic, no gallops, rubs, or murmurs. No lower extremity edema. Pulmonary: positive breath sounds bilaterally. Gastrointestinal. Abdomen with no organomegaly, non tender, no rebound or guarding Skin. No rashes Musculoskeletal: no joint deformities     Data Reviewed: I have personally reviewed following labs and imaging studies  CBC: Recent Labs  Lab 02/15/19 1726 02/17/19 0105 02/18/19 0035 02/19/19 1615  WBC 4.6 7.8 11.3* 12.5*  NEUTROABS 3.1 6.0 8.6* 9.2*  HGB 13.5 13.7 14.4 14.7  HCT 42.8 43.7 44.7 45.2  MCV 94.1 93.6 92.7 92.1  PLT 132* 165 218 [id number]   Basic Metabolic Panel: Recent Labs  Lab 02/16/19 0920 02/17/19 0105 02/18/19 0035 02/19/19 0051  02/19/19 1615  NA 139 140 142 144 145  K 4.4 4.6 4.3 4.2 3.9  CL 108 106 110 110 104  CO2 20* 20* 21* 22 27  GLUCOSE 231* 257* 237* 238* 200*  BUN 19 25* 30* 33* 34*  CREATININE 0.78 0.83 0.93 0.88 0.94  CALCIUM 8.3* 8.6* 8.7* 8.8* 8.5*  MG  --  2.4 2.7*  --   --    GFR: Estimated Creatinine Clearance: 52.3 mL/min (by C-G formula based on SCr of 0.94 mg/dL). Liver Function Tests: Recent Labs  Lab 02/16/19 0920 02/17/19 0105 02/18/19 0035 02/19/19 0051 02/19/19 1615  AST 80* 71* 60* 49* 92*  ALT 49* 44 43 39 52*  ALKPHOS 35* 38 44 46 51  BILITOT 0.5 0.7 0.8 0.5 0.6  PROT 6.7 6.7 6.6 6.0* 6.4*  ALBUMIN 3.2* 3.2* 3.4* 3.0* 3.4*   No results for input(s): LIPASE, AMYLASE in the last 168 hours. No results for input(s): AMMONIA in the last 168 hours. Coagulation Profile: No results for input(s): INR, PROTIME in the last 168 hours. Cardiac Enzymes: No results for input(s): CKTOTAL, CKMB, CKMBINDEX, TROPONINI in the last 168 hours. BNP (last 3 results) No results for input(s): PROBNP in the last 8760 hours. HbA1C: No results for input(s): HGBA1C in the last 72 hours. CBG: Recent Labs  Lab 02/18/19 2021 02/19/19 0750 02/19/19  1126 02/19/19 1602 02/19/19 2210  GLUCAP 244* 221* 157* 269* 92   Lipid Profile: No results for input(s): CHOL, HDL, LDLCALC, TRIG, CHOLHDL, LDLDIRECT in the last 72 hours. Thyroid Function Tests: No results for input(s): TSH, T4TOTAL, FREET4, T3FREE, THYROIDAB in the last 72 hours. Anemia Panel: Recent Labs    02/19/19 0051 02/20/19 0155  FERRITIN 896* 802*      Radiology Studies: I have reviewed all of the imaging during this hospital visit personally     Scheduled Meds: . apraclonidine  1 drop Right Eye BID  . aspirin  81 mg Oral BID  . atorvastatin  40 mg Oral QPM  . chlorpheniramine-HYDROcodone  5 mL Oral Q12H  . dexamethasone (DECADRON) injection  6 mg Intravenous Q24H  . enalapril  2.5 mg Oral Daily  . enoxaparin  (LOVENOX) injection  40 mg Subcutaneous Q24H  . famotidine  20 mg Oral Daily  . insulin aspart  0-15 Units Subcutaneous TID WC  . insulin glargine  15 Units Subcutaneous Daily  . Ipratropium-Albuterol  1 puff Inhalation Q6H  . latanoprost  1 drop Right Eye QHS  . mouth rinse  15 mL Mouth Rinse BID  . mirabegron ER  50 mg Oral Daily  . sodium chloride flush  3 mL Intravenous Q12H  . sodium chloride flush  3 mL Intravenous Q12H  . timolol  1 drop Right Eye BID   Continuous Infusions: . sodium chloride    . cefTRIAXone (ROCEPHIN)  IV 1 g (02/19/19 1305)     LOS: 5 days        Mauricio Gerome Apley, MD

## 2019-02-21 LAB — BASIC METABOLIC PANEL
Anion gap: 13 (ref 5–15)
BUN: 34 mg/dL — ABNORMAL HIGH (ref 8–23)
CO2: 24 mmol/L (ref 22–32)
Calcium: 8.7 mg/dL — ABNORMAL LOW (ref 8.9–10.3)
Chloride: 107 mmol/L (ref 98–111)
Creatinine, Ser: 0.85 mg/dL (ref 0.44–1.00)
GFR calc Af Amer: >60 mL/min (ref >60)
GFR calc non Af Amer: >60 mL/min (ref >60)
Glucose, Bld: 247 mg/dL — ABNORMAL HIGH (ref 70–99)
Potassium: 4.4 mmol/L (ref 3.5–5.1)
Sodium: 144 mmol/L (ref 135–145)

## 2019-02-21 LAB — GLUCOSE, CAPILLARY
Glucose-Capillary: 226 mg/dL — ABNORMAL HIGH (ref 70–99)
Glucose-Capillary: 163 mg/dL — ABNORMAL HIGH (ref 70–99)
Glucose-Capillary: 209 mg/dL — ABNORMAL HIGH (ref 70–99)
Glucose-Capillary: 200 mg/dL — ABNORMAL HIGH (ref 70–99)

## 2019-02-21 MED ORDER — ALPRAZOLAM 0.5 MG PO TABS
1.0000 mg | ORAL_TABLET | Freq: Three times a day (TID) | ORAL | Status: DC | PRN
  Administered 2019-02-22 – 2019-02-23 (×2): 1 mg via ORAL
  Filled 2019-02-21 (×2): qty 2

## 2019-02-21 NOTE — Evaluation (Signed)
Physical Therapy Evaluation Patient Details Name: Lindsey Baldwin MRN: [id number] DOB: 08-22-1945 Today's Date: 02/21/2019   History of Present Illness  73 y/o female w/ hx of DM II, osteoporosis, OSA on CPAP, OAB. OA. HTN, galucoma, GERD, bronchitis, L blind eye, R knee arthroplasty. Pt tested + for COVID 11/13. evaluated in the emergency department November 10, and then November 13, on her second ED visit she was diagnosed with COVID-19 and discharged to assisted living facility. her sx continue to deteriorate, she developed hypoxemia and persistent fever hence returned to hospital.  Clinical Impression   Pt admitted with above diagnosis. Pt currently with functional limitations due to the deficits listed below (see PT Problem List). Pt major limitation is with 02 saturations, she is able to get in and our of bed with SBA and cues, w/ set up and line management is able to transfer to [facility] with SBA  And complete tasks there, able to take few steps from [facility] to recliner also with SBA and cues, now uses RW for mobility for sfety. At rest pt on 15L/min via HFNC and also NRB at 15L and sats in mid to high 80s, with activity desats to low 80s. Prior to illness pt states was quite independent. Pt will benefit from skilled PT to increase their independence and safety with mobility to allow discharge to the venue listed below.       Follow Up Recommendations CIR;SNF    Equipment Recommendations  None recommended by PT    Recommendations for Other Services OT consult     Precautions / Restrictions Precautions Precautions: Other (comment)(at rest in high 80s, w/ activity desat to low 80s) Precaution Comments: on 15L via HFNC and NRB and sats at rest in mid-high 80s, w/ activity drops to low 80s. Restrictions Weight Bearing Restrictions: No      Mobility  Bed Mobility Overal bed mobility: Modified Independent                Transfers Overall transfer level: Needs  assistance Equipment used: Rolling walker (2 wheeled) Transfers: Sit to/from [employer] Transfers Sit to Stand: Supervision Stand pivot transfers: Supervision       General transfer comment: needs assist with line management and also cues for safety with walker use  Ambulation/Gait             General Gait Details: did not attempt ambulation at this time as pt saturation is quite limiting, she is on 15L/min via HFNC and also NRB 15L  Stairs            Wheelchair Mobility    Modified Rankin (Stroke Patients Only)       Balance Overall balance assessment: Modified Independent                                           Pertinent Vitals/Pain      Home Living Family/patient expects to be discharged to:: Assisted living                 Additional Comments: was at assisted living prior to this hospitalization    Prior Function Level of Independence: Independent         Comments: states was independent prior to illness     Hand Dominance        Extremity/Trunk Assessment   Upper Extremity Assessment Upper Extremity Assessment: Generalized weakness  Lower Extremity Assessment Lower Extremity Assessment: Generalized weakness       Communication   Communication: No difficulties  Cognition Arousal/Alertness: Awake/alert Behavior During Therapy: WFL for tasks assessed/performed Overall Cognitive Status: Within Functional Limits for tasks assessed                                        General Comments      Exercises Other Exercises Other Exercises: reinforced use of IS and flutter valve once settled again Other Exercises: was able to complete sit<>stand 2 x 5 at EOB this am   Assessment/Plan    PT Assessment Patient needs continued PT services  PT Problem List Decreased activity tolerance;Decreased strength       PT Treatment Interventions Gait training;Functional mobility  training;Therapeutic activities;Therapeutic exercise;Balance training;Neuromuscular re-education;Patient/family education    PT Goals (Current goals can be found in the Care Plan section)  Acute Rehab PT Goals Patient Stated Goal: not get so out of breath Time For Goal Achievement: 03/07/19 Potential to Achieve Goals: Fair    Frequency Min 3X/week   Barriers to discharge        Co-evaluation               AM-PAC PT "6 Clicks" Mobility  Outcome Measure Help needed turning from your back to your side while in a flat bed without using bedrails?: None Help needed moving from lying on your back to sitting on the side of a flat bed without using bedrails?: None Help needed moving to and from a bed to a chair (including a wheelchair)?: A Little Help needed standing up from a chair using your arms (e.g., wheelchair or bedside chair)?: A Little Help needed to walk in hospital room?: A Lot Help needed climbing 3-5 steps with a railing? : A Lot 6 Click Score: 18    End of Session Equipment Utilized During Treatment: Oxygen(HFNC and NRB) Activity Tolerance: Treatment limited secondary to medical complications (Comment) Patient left: in chair;with call bell/phone within reach Nurse Communication: Mobility status;Other (comment)(nurse was in room for part of assessment) PT Visit Diagnosis: Other abnormalities of gait and mobility (R26.89);Muscle weakness (generalized) (M62.81)    Time: [id number] PT Time Calculation (min) (ACUTE ONLY): 20 min   Charges:              Horald Chestnut, PT   Delford Field 02/21/2019, 1:44 PM

## 2019-02-21 NOTE — Progress Notes (Signed)
   02/21/19 1237  Family/Significant Other Communication  Family/Significant Other Update Called;Updated (Spoke with friend Seth Bake)

## 2019-02-21 NOTE — Plan of Care (Signed)
Patient anxious at times. Desats with activity, requires high amounts of O2. Monitoring.  Problem: Clinical Measurements: Goal: Ability to maintain clinical measurements within normal limits will improve Outcome: Not Progressing Goal: Respiratory complications will improve Outcome: Not Progressing   Problem: Activity: Goal: Risk for activity intolerance will decrease Outcome: Not Progressing   Problem: Coping: Goal: Level of anxiety will decrease Outcome: Not Progressing   Problem: Coping: Goal: Psychosocial and spiritual needs will be supported Outcome: Not Progressing   Problem: Respiratory: Goal: Complications related to the disease process, condition or treatment will be avoided or minimized Outcome: Not Progressing

## 2019-02-21 NOTE — Progress Notes (Signed)
PROGRESS NOTE    Lindsey Baldwin  [id number] DOB: [birth year] DOA: 02/15/2019 PCP: Donald Prose, MD    Brief Narrative:  73 year old female who presented with dyspnea. She does have significant past medical history for hypertension, type 2 diabetes mellitus, GERD, andobstructive sleep apnea. Patient reported 7 days of dyspnea, evaluated in the emergency department November 10,and then November 13, on her second ED visit she was diagnosed with COVID-19 and discharged to assisted living facility. Unfortunately her symptoms continue to deteriorate, he developed hypoxemia and persistent fever. On her initial physical examination her oximetry was 86% on room air,blood pressure 152/70, pulse rate 86, respirate 26, temperature 98.4,she had dry mucous membranes, her lungs were clear to auscultation bilaterally, heart S1-S2 present rhythmic, abdomen soft, no lower extremity edema. Sodium 139, potassium 4.3, chloride 106, bicarb 24, glucose 157, BUN 26, creatinine 0.98, white count 4.6, hemoglobin 13.5, hematocrit 42.8, platelets 132.SARS COVID-19 was positive. Chest radiograph with bilateral interstitial infiltrates, at bases and right upper lobe. EKG 82 bpm, normal axis, sinus rhythm, no ST segment T wave changes.  Patient was admitted to the hospitalwith theworking diagnosis of acute hypoxic respiratory failuredue toSARS COVID-19 viral pneumonia.  Patient was admitted to the progressive care unit, she had persistent high oxygen requirements, high flow nasal cannula, 13 L/min. Received treatment with remdesivir and systemic steroids. November 16 had convalescent plasma andNovember 17, Actemra.  Patient continue to have a high oxygen requirements, follow chest film with dense infiltrate on the left upper lobe with positive air bronchogram. Added antibiotic therapy with ceftriaxone for suspected bacterial over-infection.   Assessment & Plan:   Active Problems:   Pneumonia  due to COVID-19 virus   Acute respiratory failure due to COVID-19 ([place])   DM2 (diabetes mellitus, type 2) (HCC)   Essential hypertension   Hyperlipemia   OSA on CPAP   1.Acute hypoxic respiratory failure due to SARS COVID-19 viral pneumonia with bacterial coinfection. Sp convalescent plasma and actemra11/17/20.Conmpleted 5 doses of Remdesivir.   RR: 21  Pulse oxymetry: 90-93 Fi02: 15 LPM HFNC and 15 LPM per non rebreather mask.  COVID-19 Labs  Recent Labs    02/19/19 0051 02/20/19 0155 02/20/19 0324  DDIMER 1.58*  --  2.69*  FERRITIN 896* 802*  --   CRP 2.0* 1.2*  --     Lab Results  Component Value Date   SARSCOV2NAA POSITIVE (A) 02/13/2019   [place] Not Detected 01/31/2019    Patient with persistent high oxygen requirements, rapid oxygen desaturation on room air. Continue to have evident dyspnea and increase work of breathing despite she reporting stable dyspnea.   Will continue with systemic corticosteroids with dexamethasone.#7 Encourage prone and right lateral decubitus. On antitussive agents bronchodilator therapy, and airway clearing techniques with flutter valve / incentive spirometer. Out of bed to chair as tolerated.  Continue antibiotic therapy with IV ceftriaxone for now.   Patient at a very high risk for worsening respiratory failure.  Will plan for CT chest in 24 to 48 H, if no improvement in oxygenation.    2. T2DM with steroid induced hyperglycemia. Her fasting glucose is 247 with capillary glucose 226. Continue basal insulin therapy with 15 units and sliding scale. Poor oral intake.   3. HTN.On enalapril for blood pressure control.    4. Dyslipidemia.On atorvastatin .   5. Obesity.  BMI is 30,4.   DVT prophylaxis:enoxaparin Code Status:full Family Communication:no family at the bedside Disposition Plan/ discharge barriers:pending clinical improvement,   Body mass index is  30.48 kg/m. Malnutrition Type:       Malnutrition Characteristics:      Nutrition Interventions:     RN Pressure Injury Documentation:     Consultants:     Procedures:     Antimicrobials:   Ceftriaxone     Subjective: Patient continue to have high oxygen requirements, very weak and debilitates, intermittent prone and getting out of bed to chair, poor oral intake. No nausea or vomiting, no chest pain.    Objective: Vitals:   02/20/19 0735 02/20/19 1600 02/20/19 2008 02/21/19 0504  BP: 140/78 128/62 122/77 133/61  Pulse: 77 76    Resp: (!) 22  (!) 22 (!) 21  Temp: 99 F (37.2 C) 99 F (37.2 C) 98.9 F (37.2 C) 98.4 F (36.9 C)  TempSrc: Oral Oral Axillary Axillary  SpO2: 90% 90% 93% 90%  Weight:      Height:        Intake/Output Summary (Last 24 hours) at 02/21/2019 0739 Last data filed at 02/20/2019 2225 Gross per 24 hour  Intake 1063 ml  Output -  Net 1063 ml   Filed Weights   02/15/19 2300  Weight: 75.6 kg    Examination:   General: deconditioned and ill looking appearing. Positive dyspnea. Neurology: Awake and alert.  E ENT: positive pallor, no icterus, oral mucosa dry.  Cardiovascular: No JVD. S1-S2 present, rhythmic, no gallops, rubs, or murmurs. No lower extremity edema. Pulmonary: positive breath sounds bilaterally,  no wheezing, rhonchi or rales.( poor air movement) Gastrointestinal. Abdomen with no organomegaly, non tender, no rebound or guarding Skin. No rashes Musculoskeletal: no joint deformities     Data Reviewed: I have personally reviewed following labs and imaging studies  CBC: Recent Labs  Lab 02/15/19 1726 02/17/19 0105 02/18/19 0035 02/19/19 1615 02/20/19 0155  WBC 4.6 7.8 11.3* 12.5* 13.1*  NEUTROABS 3.1 6.0 8.6* 9.2* 9.1*  HGB 13.5 13.7 14.4 14.7 14.7  HCT 42.8 43.7 44.7 45.2 46.6*  MCV 94.1 93.6 92.7 92.1 95.3  PLT 132* 165 218 231 [id number]   Basic Metabolic Panel: Recent Labs  Lab 02/17/19 0105 02/18/19 0035 02/19/19 0051 02/19/19 1615  02/21/19 0133  NA 140 142 144 145 144  K 4.6 4.3 4.2 3.9 4.4  CL 106 110 110 104 107  CO2 20* 21* 22 27 24   GLUCOSE 257* 237* 238* 200* 247*  BUN 25* 30* 33* 34* 34*  CREATININE 0.83 0.93 0.88 0.94 0.85  CALCIUM 8.6* 8.7* 8.8* 8.5* 8.7*  MG 2.4 2.7*  --   --   --    GFR: Estimated Creatinine Clearance: 57.8 mL/min (by C-G formula based on SCr of 0.85 mg/dL). Liver Function Tests: Recent Labs  Lab 02/16/19 0920 02/17/19 0105 02/18/19 0035 02/19/19 0051 02/19/19 1615  AST 80* 71* 60* 49* 92*  ALT 49* 44 43 39 52*  ALKPHOS 35* 38 44 46 51  BILITOT 0.5 0.7 0.8 0.5 0.6  PROT 6.7 6.7 6.6 6.0* 6.4*  ALBUMIN 3.2* 3.2* 3.4* 3.0* 3.4*   No results for input(s): LIPASE, AMYLASE in the last 168 hours. No results for input(s): AMMONIA in the last 168 hours. Coagulation Profile: No results for input(s): INR, PROTIME in the last 168 hours. Cardiac Enzymes: No results for input(s): CKTOTAL, CKMB, CKMBINDEX, TROPONINI in the last 168 hours. BNP (last 3 results) No results for input(s): PROBNP in the last 8760 hours. HbA1C: No results for input(s): HGBA1C in the last 72 hours. CBG: Recent Labs  Lab 02/19/19 2210 02/20/19  [id number] 02/20/19 1127 02/20/19 1711 02/20/19 2117  GLUCAP 92 216* 209* 136* 176*   Lipid Profile: No results for input(s): CHOL, HDL, LDLCALC, TRIG, CHOLHDL, LDLDIRECT in the last 72 hours. Thyroid Function Tests: No results for input(s): TSH, T4TOTAL, FREET4, T3FREE, THYROIDAB in the last 72 hours. Anemia Panel: Recent Labs    02/19/19 0051 02/20/19 0155  FERRITIN 896* 802*      Radiology Studies: I have reviewed all of the imaging during this hospital visit personally     Scheduled Meds: . apraclonidine  1 drop Right Eye BID  . aspirin  81 mg Oral BID  . atorvastatin  40 mg Oral QPM  . chlorpheniramine-HYDROcodone  5 mL Oral Q12H  . dexamethasone (DECADRON) injection  6 mg Intravenous Q24H  . enalapril  2.5 mg Oral Daily  . enoxaparin  (LOVENOX) injection  40 mg Subcutaneous Q24H  . famotidine  20 mg Oral Daily  . insulin aspart  0-15 Units Subcutaneous TID WC  . insulin glargine  15 Units Subcutaneous Daily  . Ipratropium-Albuterol  1 puff Inhalation Q6H  . latanoprost  1 drop Right Eye QHS  . mouth rinse  15 mL Mouth Rinse BID  . mirabegron ER  50 mg Oral Daily  . sodium chloride flush  3 mL Intravenous Q12H  . sodium chloride flush  3 mL Intravenous Q12H  . timolol  1 drop Right Eye BID   Continuous Infusions: . sodium chloride    . cefTRIAXone (ROCEPHIN)  IV 1 g (02/20/19 0939)     LOS: 6 days        Mauricio Gerome Apley, MD

## 2019-02-21 NOTE — Progress Notes (Signed)
1249: Page to MD. Patient friend Seth Bake asking about possible intubation. Sats remain mid to high 80s, with drops to low 80s. Up to chair.

## 2019-02-22 ENCOUNTER — Inpatient Hospital Stay (HOSPITAL_COMMUNITY): Payer: Medicare Other

## 2019-02-22 LAB — BASIC METABOLIC PANEL
Anion gap: 11 (ref 5–15)
BUN: 35 mg/dL — ABNORMAL HIGH (ref 8–23)
CO2: 26 mmol/L (ref 22–32)
Calcium: 9 mg/dL (ref 8.9–10.3)
Chloride: 107 mmol/L (ref 98–111)
Creatinine, Ser: 0.85 mg/dL (ref 0.44–1.00)
GFR calc Af Amer: >60 mL/min (ref >60)
GFR calc non Af Amer: >60 mL/min (ref >60)
Glucose, Bld: 214 mg/dL — ABNORMAL HIGH (ref 70–99)
Potassium: 4.1 mmol/L (ref 3.5–5.1)
Sodium: 144 mmol/L (ref 135–145)

## 2019-02-22 LAB — CBC WITH DIFFERENTIAL/PLATELET
Abs Immature Granulocytes: 0.27 10*3/uL — ABNORMAL HIGH (ref 0.00–0.07)
Basophils Absolute: 0.1 10*3/uL (ref 0.0–0.1)
Basophils Relative: 0 %
Eosinophils Absolute: 0.1 10*3/uL (ref 0.0–0.5)
Eosinophils Relative: 1 %
HCT: 47 % — ABNORMAL HIGH (ref 36.0–46.0)
Hemoglobin: 15 g/dL (ref 12.0–15.0)
Immature Granulocytes: 2 %
Lymphocytes Relative: 12 %
Lymphs Abs: 1.9 10*3/uL (ref 0.7–4.0)
MCH: 29.6 pg (ref 26.0–34.0)
MCHC: 31.9 g/dL (ref 30.0–36.0)
MCV: 92.9 fL (ref 80.0–100.0)
Monocytes Absolute: 0.9 10*3/uL (ref 0.1–1.0)
Monocytes Relative: 5 %
Neutro Abs: 13 10*3/uL — ABNORMAL HIGH (ref 1.7–7.7)
Neutrophils Relative %: 80 %
Platelets: 269 10*3/uL (ref 150–400)
RBC: 5.06 MIL/uL (ref 3.87–5.11)
RDW: 13.4 % (ref 11.5–15.5)
WBC: 16.3 10*3/uL — ABNORMAL HIGH (ref 4.0–10.5)
nRBC: 0 % (ref 0.0–0.2)

## 2019-02-22 LAB — GLUCOSE, CAPILLARY
Glucose-Capillary: 172 mg/dL — ABNORMAL HIGH (ref 70–99)
Glucose-Capillary: 129 mg/dL — ABNORMAL HIGH (ref 70–99)
Glucose-Capillary: 210 mg/dL — ABNORMAL HIGH (ref 70–99)

## 2019-02-22 LAB — C-REACTIVE PROTEIN: CRP: <0.8 mg/dL (ref <1.0)

## 2019-02-22 LAB — D-DIMER, QUANTITATIVE: D-Dimer, Quant: 9.55 ug/mL-FEU — ABNORMAL HIGH (ref 0.00–0.50)

## 2019-02-22 LAB — FERRITIN: Ferritin: 643 ng/mL — ABNORMAL HIGH (ref 11–307)

## 2019-02-22 MED ORDER — ENOXAPARIN SODIUM 40 MG/0.4ML ~~LOC~~ SOLN
40.0000 mg | Freq: Two times a day (BID) | SUBCUTANEOUS | Status: DC
  Administered 2019-02-22 – 2019-03-13 (×39): 40 mg via SUBCUTANEOUS
  Filled 2019-02-22 (×39): qty 0.4

## 2019-02-22 MED ORDER — IOHEXOL 350 MG/ML SOLN
75.0000 mL | Freq: Once | INTRAVENOUS | Status: AC | PRN
  Administered 2019-02-22: 75 mL via INTRAVENOUS

## 2019-02-22 NOTE — Progress Notes (Signed)
Occupational Therapy Evaluation Patient Details Name: Lindsey Baldwin MRN: [id number] DOB: [birth year] Today's Date: 02/22/2019    History of Present Illness 73 y/o female w/ hx of DM II, osteoporosis, OSA on CPAP, OAB. OA. HTN, galucoma, GERD, bronchitis, L blind eye, R knee arthroplasty. Pt tested + for COVID 11/13. evaluated in the emergency department November 10, and then November 13, on her second ED visit she was diagnosed with COVID-19 and discharged to assisted living facility. her sx continue to deteriorate, she developed hypoxemia and persistent fever hence returned to hospital.   Clinical Impression   PTA pt was living at [facility] independent living. Pt was independent in ADLs and mobility, however had facility staff available if assistance required. Pt did not ambulate with an assistive device and reported 0 falls in the last 6 months. Pt currently requires supervision to min assist with self-care and functional transfer tasks. Pt able to transfer to/from [facility] and to bedside chair with FWW. Pt's O2 SATs fluctuating between 88% to 96% at rest while on 15L HFNC and 15L NRB. During activity O2 SATs dropped to 86%. DOE 3/4. Pt demonstrates decreased strength, endurance, balance, standing tolerance, and activity tolerance impacting ability to complete self-care and functional transfer tasks safely and independently. Recommend skilled OT services to address above deficits in order to promote function and prevent further decline. Recommend continued OT services following discharge from hospital.    Follow Up Recommendations  CIR;SNF(Depending on patient's progress in therapy)    Equipment Recommendations       Recommendations for Other Services       Precautions / Restrictions Precautions Precautions: None Restrictions Weight Bearing Restrictions: No      Mobility Bed Mobility Overal bed mobility: Needs Assistance Bed Mobility: Supine to Sit     Supine to sit:  Supervision;HOB elevated        Transfers Overall transfer level: Needs assistance Equipment used: Rolling walker (2 wheeled) Transfers: Sit to/from [employer] Transfers Sit to Stand: Supervision Stand pivot transfers: Supervision;Min guard       General transfer comment: Assist with line management and safety with FWW    Balance Overall balance assessment: Needs assistance   Sitting balance-Leahy Scale: Good       Standing balance-Leahy Scale: Fair                             ADL either performed or assessed with clinical judgement   ADL Overall ADL's : Needs assistance/impaired Eating/Feeding: Independent   Grooming: Set up;Sitting   Upper Body Bathing: Minimal assistance;Sitting   Lower Body Bathing: Minimal assistance;Sit to/from stand;Sitting/lateral leans   Upper Body Dressing : Set up;Sitting   Lower Body Dressing: Minimal assistance;Sit to/from stand;Sitting/lateral leans   Toilet Transfer: Min guard;BSC   Toileting- Water quality scientist and Hygiene: Min guard;Sit to/from stand       Functional mobility during ADLs: Min guard;Rolling walker       Vision Baseline Vision/History: Wears glasses Wears Glasses: Reading only       Perception     Praxis      Pertinent Vitals/Pain Pain Assessment: No/denies pain     Hand Dominance     Extremity/Trunk Assessment Upper Extremity Assessment Upper Extremity Assessment: Generalized weakness   Lower Extremity Assessment Lower Extremity Assessment: Defer to PT evaluation       Communication Communication Communication: No difficulties   Cognition Arousal/Alertness: Awake/alert Behavior During Therapy: Anxious(Due to SOB) Overall Cognitive  Status: Within Functional Limits for tasks assessed                                     General Comments  O2 SATs 86% to 96% on 15L HFNC and 15L NRB. RR 20-30. HR 78-118. Educated pt on pursed lip breathing exercises and  anxiety management. Educated pt on activity modifications and energy conservation techniques for self-care tasks. Educated pt on safety strategies with transfers and FWW.    Exercises Exercises: Other exercises Other Exercises Other Exercises: Pursed lip breathing exercises x 5   Shoulder Instructions      Home Living Family/patient expects to be discharged to:: Other (Comment)(Independent living) Living Arrangements: Alone Available Help at Discharge: Other (Comment)(Facility staff if needed) Type of Home: Independent living facility Home Access: Level entry     Home Layout: One level     Bathroom Shower/Tub: Occupational psychologist: Handicapped height     Home Equipment: Environmental consultant - 2 wheels;Bedside commode;Shower seat   Additional Comments: was at assisted living prior to this hospitalization      Prior Functioning/Environment Level of Independence: Independent        Comments: states was independent prior to illness        OT Problem List: Decreased strength;Decreased activity tolerance;Impaired balance (sitting and/or standing);Decreased knowledge of use of DME or AE;Cardiopulmonary status limiting activity      OT Treatment/Interventions: Self-care/ADL training;Therapeutic exercise;Neuromuscular education;Energy conservation;DME and/or AE instruction;Therapeutic activities;Patient/family education;Balance training    OT Goals(Current goals can be found in the care plan section) Acute Rehab OT Goals Patient Stated Goal: To be able to breath Time For Goal Achievement: 03/08/19 Potential to Achieve Goals: Good ADL Goals Pt Will Perform Grooming: with supervision;standing Pt Will Perform Lower Body Dressing: with supervision;sit to/from stand;sitting/lateral leans Pt Will Transfer to Toilet: with supervision;ambulating Pt Will Perform Toileting - Clothing Manipulation and hygiene: with supervision;sit to/from stand Additional ADL Goal #1: Pt will recall  and demonstrate breathing exercises with 0 verbal cues required.  OT Frequency: Min 3X/week   Barriers to D/C:            Co-evaluation              AM-PAC OT "6 Clicks" Daily Activity     Outcome Measure Help from another person eating meals?: None Help from another person taking care of personal grooming?: A Little Help from another person toileting, which includes using toliet, bedpan, or urinal?: A Little Help from another person bathing (including washing, rinsing, drying)?: A Lot Help from another person to put on and taking off regular upper body clothing?: A Little Help from another person to put on and taking off regular lower body clothing?: A Little 6 Click Score: 18   End of Session Equipment Utilized During Treatment: Oxygen Nurse Communication: Mobility status  Activity Tolerance: Patient limited by fatigue(Limited by SOB and anxiety) Patient left: in chair;with call bell/phone within reach;with chair alarm set;with nursing/sitter in room  OT Visit Diagnosis: Unsteadiness on feet (R26.81);Other abnormalities of gait and mobility (R26.89);Muscle weakness (generalized) (M62.81)                Time: [id number] OT Time Calculation (min): 35 min Charges:  OT General Charges $OT Visit: 1 Visit OT Evaluation $OT Eval Moderate Complexity: 1 Mod OT Treatments $Self Care/Home Management : 8-22 mins  Mauri Brooklyn OTR/L [phone]   Mauri Brooklyn  02/22/2019, 9:35 AM

## 2019-02-22 NOTE — Progress Notes (Addendum)
PROGRESS NOTE    Lindsey Baldwin  [id number] DOB: 1945/10/22 DOA: 02/15/2019 PCP: Donald Prose, MD    Brief Narrative:  73 year old female who presented with dyspnea. She does have significant past medical history for hypertension, type 2 diabetes mellitus, GERD, andobstructive sleep apnea. Patient reported 7 days of dyspnea, evaluated in the emergency department November 10,and then November 13, on her second ED visit she was diagnosed with COVID-19 and discharged to assisted living facility. Unfortunately her symptoms continue to deteriorate, he developed hypoxemia and persistent fever. On her initial physical examination her oximetry was 86% on room air,blood pressure 152/70, pulse rate 86, respirate 26, temperature 98.4,she had dry mucous membranes, her lungs were clear to auscultation bilaterally, heart S1-S2 present rhythmic, abdomen soft, no lower extremity edema. Sodium 139, potassium 4.3, chloride 106, bicarb 24, glucose 157, BUN 26, creatinine 0.98, white count 4.6, hemoglobin 13.5, hematocrit 42.8, platelets 132.SARS COVID-19 was positive. Chest radiograph with bilateral interstitial infiltrates, at bases and right upper lobe. EKG 82 bpm, normal axis, sinus rhythm, no ST segment T wave changes.  Patient was admitted to the hospitalwith theworking diagnosis of acute hypoxic respiratory failuredue toSARS COVID-19 viral pneumonia.  Patient was admitted to the progressive care unit, she had persistent high oxygen requirements, high flow nasal cannula, 13 L/min. Received treatment with remdesivir and systemic steroids. November 16 had convalescent plasma andNovember 17, Actemra.  Patient continue to have a high oxygen requirements, follow chest film with dense infiltrate on the left upper lobe with positive air bronchogram. Added antibiotic therapy with ceftriaxone for suspected bacterial over-infection.  Patient with persistent high oxygen requirements.     Assessment & Plan:   Active Problems:   Pneumonia due to COVID-19 virus   Acute respiratory failure due to COVID-19 ([place])   DM2 (diabetes mellitus, type 2) (HCC)   Essential hypertension   Hyperlipemia   OSA on CPAP   1.Acute hypoxic respiratory failure due to SARS COVID-19 viral pneumonia with bacterial coinfection. Sp convalescent plasma and actemra11/17/20.Conmpleted 5 doses of Remdesivir.   RR: 20  Pulse oxymetry: 93 Fi02: 15 LPM Non rebreather  COVID-19 Labs  Recent Labs    02/20/19 0155 02/20/19 0324 02/22/19 0252  DDIMER  --  2.69* 9.55*  FERRITIN 802*  --  643*  CRP 1.2*  --  <0.8    Lab Results  Component Value Date   SARSCOV2NAA POSITIVE (A) 02/13/2019   [place] Not Detected 01/31/2019    Continue to have high oxygen requirements, significant increased of d dimer, now is over 5, will order a CT chest to rule out pulmonary embolism, until imaging is done will increase dose of enoxaparin to high risk 40 mg sq q12.   Continue with IV dexamethasone. 6 mg daily #8/10. Has been in prone at night. Continue with antitussive agents, bronchodilator therapy, and airway clearing techniques with flutter valve / incentive spirometer.  IV ceftriaxone for antibiotic therapy  #4/8. Added alprazolam for anxiety with good toleration.   She continue to be at high risk for worsening respiratory failure.   2. T2DM with steroid induced hyperglycemia.Her fasting glucose this am is 214. Poor oral intake, will continue with basal insulin therapy with 15 units and sliding scale.    3. HTN.Continue with enalapril for blood pressure control.   4. Dyslipidemia.Continue with atorvastatin .  5. Obesity. BMI calculated is 30,4.   DVT prophylaxis:enoxaparin Code Status:full Family Communication:no family at the bedside Disposition Plan/ discharge barriers:pending clinical improvement   Body mass index  is 30.48 kg/m. Malnutrition Type:       Malnutrition Characteristics:      Nutrition Interventions:     RN Pressure Injury Documentation:     Consultants:     Procedures:     Antimicrobials:   Ceftriaxone    Subjective: This am patient is out of bed to the chair, tolerated well prone last night. This am continue to have dyspnea and fatigue, tolerating po well, but poor oral intake.   Objective: Vitals:   02/21/19 1546 02/21/19 2001 02/22/19 0449 02/22/19 0737  BP: 128/75 (!) 145/63 (!) 143/80 (!) 143/56  Pulse: 79   78  Resp: (!) 23 (!) 22  20  Temp: 98.8 F (37.1 C) 97.8 F (36.6 C) 97.9 F (36.6 C) 98.4 F (36.9 C)  TempSrc: Axillary Oral Axillary Axillary  SpO2: 90%   93%  Weight:      Height:        Intake/Output Summary (Last 24 hours) at 02/22/2019 0818 Last data filed at 02/21/2019 1010 Gross per 24 hour  Intake 233.61 ml  Output -  Net 233.61 ml   Filed Weights   02/15/19 2300  Weight: 75.6 kg    Examination:   General: deconditioned and ill looking appearing, positive dyspnea.  Neurology: Awake and alert, non focal  E ENT: mild pallor, no icterus, oral mucosa moist Cardiovascular: No JVD. S1-S2 present, rhythmic, no gallops, rubs, or murmurs. No lower extremity edema. Pulmonary: positive breath sounds bilaterally. Gastrointestinal. Abdomen with no organomegaly, non tender, no rebound or guarding Skin. No rashes Musculoskeletal: no joint deformities     Data Reviewed: I have personally reviewed following labs and imaging studies  CBC: Recent Labs  Lab 02/17/19 0105 02/18/19 0035 02/19/19 1615 02/20/19 0155 02/22/19 0252  WBC 7.8 11.3* 12.5* 13.1* 16.3*  NEUTROABS 6.0 8.6* 9.2* 9.1* 13.0*  HGB 13.7 14.4 14.7 14.7 15.0  HCT 43.7 44.7 45.2 46.6* 47.0*  MCV 93.6 92.7 92.1 95.3 92.9  PLT 165 218 231 251 [id number]   Basic Metabolic Panel: Recent Labs  Lab 02/17/19 0105 02/18/19 0035 02/19/19 0051 02/19/19 1615 02/21/19 0133 02/22/19 0252  NA 140 142 144 145 144  144  K 4.6 4.3 4.2 3.9 4.4 4.1  CL 106 110 110 104 107 107  CO2 20* 21* 22 27 24 26   GLUCOSE 257* 237* 238* 200* 247* 214*  BUN 25* 30* 33* 34* 34* 35*  CREATININE 0.83 0.93 0.88 0.94 0.85 0.85  CALCIUM 8.6* 8.7* 8.8* 8.5* 8.7* 9.0  MG 2.4 2.7*  --   --   --   --    GFR: Estimated Creatinine Clearance: 57.8 mL/min (by C-G formula based on SCr of 0.85 mg/dL). Liver Function Tests: Recent Labs  Lab 02/16/19 0920 02/17/19 0105 02/18/19 0035 02/19/19 0051 02/19/19 1615  AST 80* 71* 60* 49* 92*  ALT 49* 44 43 39 52*  ALKPHOS 35* 38 44 46 51  BILITOT 0.5 0.7 0.8 0.5 0.6  PROT 6.7 6.7 6.6 6.0* 6.4*  ALBUMIN 3.2* 3.2* 3.4* 3.0* 3.4*   No results for input(s): LIPASE, AMYLASE in the last 168 hours. No results for input(s): AMMONIA in the last 168 hours. Coagulation Profile: No results for input(s): INR, PROTIME in the last 168 hours. Cardiac Enzymes: No results for input(s): CKTOTAL, CKMB, CKMBINDEX, TROPONINI in the last 168 hours. BNP (last 3 results) No results for input(s): PROBNP in the last 8760 hours. HbA1C: No results for input(s): HGBA1C in the last 72 hours. CBG:  Recent Labs  Lab 02/21/19 0746 02/21/19 1153 02/21/19 1557 02/21/19 2151 02/22/19 0739  GLUCAP 226* 163* 209* 200* 172*   Lipid Profile: No results for input(s): CHOL, HDL, LDLCALC, TRIG, CHOLHDL, LDLDIRECT in the last 72 hours. Thyroid Function Tests: No results for input(s): TSH, T4TOTAL, FREET4, T3FREE, THYROIDAB in the last 72 hours. Anemia Panel: Recent Labs    02/20/19 0155 02/22/19 0252  FERRITIN 802* 643*      Radiology Studies: I have reviewed all of the imaging during this hospital visit personally     Scheduled Meds: . apraclonidine  1 drop Right Eye BID  . aspirin  81 mg Oral BID  . atorvastatin  40 mg Oral QPM  . chlorpheniramine-HYDROcodone  5 mL Oral Q12H  . dexamethasone (DECADRON) injection  6 mg Intravenous Q24H  . enalapril  2.5 mg Oral Daily  . enoxaparin  (LOVENOX) injection  40 mg Subcutaneous Q24H  . famotidine  20 mg Oral Daily  . insulin aspart  0-15 Units Subcutaneous TID WC  . insulin glargine  15 Units Subcutaneous Daily  . Ipratropium-Albuterol  1 puff Inhalation Q6H  . latanoprost  1 drop Right Eye QHS  . mouth rinse  15 mL Mouth Rinse BID  . mirabegron ER  50 mg Oral Daily  . sodium chloride flush  3 mL Intravenous Q12H  . sodium chloride flush  3 mL Intravenous Q12H  . timolol  1 drop Right Eye BID   Continuous Infusions: . sodium chloride    . cefTRIAXone (ROCEPHIN)  IV Stopped (02/21/19 1001)     LOS: 7 days        Lindsey Lange Gerome Apley, MD

## 2019-02-22 NOTE — NC FL2 (Signed)
[place] LEVEL OF CARE SCREENING TOOL     IDENTIFICATION  Patient Name: Lindsey Baldwin Birthdate: 10-12-1945 Sex: female Admission Date (Current Location): 02/15/2019  [facility] and [place] Number:  Herbalist and Address:  [facility], [place] [address] [place], [place] [address]      Provider Number: [id number]  Attending Physician Name and Address:  Tawni Millers  Relative Name and Phone Number:       Current Level of Care: Hospital Recommended Level of Care: [place] Prior Approval Number:    Date Approved/Denied:   PASRR Number: [id number]  Discharge Plan: SNF    Current Diagnoses: Patient Active Problem List   Diagnosis Date Noted  . Pneumonia due to COVID-19 virus 02/15/2019  . Acute respiratory failure due to COVID-19 ([place]) 02/15/2019  . DM2 (diabetes mellitus, type 2) ([place]) 02/15/2019  . Essential hypertension 02/15/2019  . Hyperlipemia 02/15/2019  . OSA on CPAP 02/15/2019  . Osteoarthritis of right knee 12/12/2017    Orientation RESPIRATION BLADDER Height & Weight     Self, Time, Situation, Place  O2(Non-rebreather Mask;HFNC, 15L) Incontinent Weight: 166 lb 10.7 oz (75.6 kg) Height:  5\' 2"  (157.5 cm)  BEHAVIORAL SYMPTOMS/MOOD NEUROLOGICAL BOWEL NUTRITION STATUS      Continent Diet(carb modified)  AMBULATORY STATUS COMMUNICATION OF NEEDS Skin   Limited Assist Verbally Normal                       Personal Care Assistance Level of Assistance  Bathing, Feeding, Dressing Bathing Assistance: Limited assistance Feeding assistance: Independent Dressing Assistance: Limited assistance     Functional Limitations Info  Sight, Hearing, Speech Sight Info: Adequate Hearing Info: Adequate Speech Info: Adequate    SPECIAL CARE FACTORS FREQUENCY  PT (By licensed PT), OT (By licensed OT)     PT Frequency: 5x OT Frequency: 5x            Contractures Contractures  Info: Not present    Additional Factors Info  Code Status, Allergies, Isolation Precautions Code Status Info: Full Code Allergies Info: Penicillins, Sulfa Antibiotics     Isolation Precautions Info: Air/Con- COVID     Current Medications (02/22/2019):  This is the current hospital active medication list Current Facility-Administered Medications  Medication Dose Route Frequency Provider Last Rate Last Dose  . 0.9 %  sodium chloride infusion  250 mL Intravenous PRN Doutova, Anastassia, MD      . acetaminophen (TYLENOL) tablet 650 mg  650 mg Oral Q6H PRN Toy Baker, MD   650 mg at 02/17/19 1253  . ALPRAZolam Duanne Moron) tablet 1 mg  1 mg Oral TID PRN Arrien, Jimmy Picket, MD   1 mg at 02/22/19 0911  . apraclonidine (IOPIDINE) 0.5 % ophthalmic solution 1 drop  1 drop Right Eye BID Toy Baker, MD   1 drop at 02/22/19 0859  . aspirin chewable tablet 81 mg  81 mg Oral BID Toy Baker, MD   81 mg at 02/22/19 0856  . atorvastatin (LIPITOR) tablet 40 mg  40 mg Oral QPM Doutova, Anastassia, MD   40 mg at 02/21/19 1703  . cefTRIAXone (ROCEPHIN) 1 g in sodium chloride 0.9 % 100 mL IVPB  1 g Intravenous Q24H Arrien, Jimmy Picket, MD   Stopped at 02/21/19 1001  . chlorpheniramine-HYDROcodone (TUSSIONEX) 10-8 MG/5ML suspension 5 mL  5 mL Oral Q12H Arrien, Jimmy Picket, MD   5 mL at 02/22/19 0858  . dexamethasone (  DECADRON) injection 6 mg  6 mg Intravenous Q24H Tawni Millers, MD   6 mg at 02/21/19 1659  . enalapril (VASOTEC) tablet 2.5 mg  2.5 mg Oral Daily Doutova, Anastassia, MD   2.5 mg at 02/22/19 0900  . enoxaparin (LOVENOX) injection 40 mg  40 mg Subcutaneous Q24H Doutova, Anastassia, MD   40 mg at 02/21/19 2214  . famotidine (PEPCID) tablet 20 mg  20 mg Oral Daily Amin, Ankit Chirag, MD   20 mg at 02/22/19 0856  . guaiFENesin-dextromethorphan (ROBITUSSIN DM) 100-10 MG/5ML syrup 10 mL  10 mL Oral Q4H PRN Doutova, Anastassia, MD   10 mL at 02/17/19 1626  .  insulin aspart (novoLOG) injection 0-15 Units  0-15 Units Subcutaneous TID WC Arrien, Jimmy Picket, MD   3 Units at 02/22/19 [phone]  . insulin glargine (LANTUS) injection 15 Units  15 Units Subcutaneous Daily Tawni Millers, MD   15 Units at 02/22/19 0900  . Ipratropium-Albuterol (COMBIVENT) respimat 1 puff  1 puff Inhalation Q6H Amin, Ankit Chirag, MD   1 puff at 02/22/19 0858  . latanoprost (XALATAN) 0.005 % ophthalmic solution 1 drop  1 drop Right Eye QHS Toy Baker, MD   1 drop at 02/21/19 2203  . MEDLINE mouth rinse  15 mL Mouth Rinse BID Arrien, Jimmy Picket, MD   15 mL at 02/22/19 0912  . mirabegron ER (MYRBETRIQ) tablet 50 mg  50 mg Oral Daily Doutova, Anastassia, MD   50 mg at 02/22/19 0900  . ondansetron (ZOFRAN) tablet 4 mg  4 mg Oral Q6H PRN Doutova, Anastassia, MD       Or  . ondansetron (ZOFRAN) injection 4 mg  4 mg Intravenous Q6H PRN Toy Baker, MD   4 mg at 02/19/19 0801  . senna-docusate (Senokot-S) tablet 2 tablet  2 tablet Oral QHS PRN Amin, Ankit Chirag, MD      . sodium chloride flush (NS) 0.9 % injection 3 mL  3 mL Intravenous Q12H Doutova, Anastassia, MD   3 mL at 02/22/19 0902  . sodium chloride flush (NS) 0.9 % injection 3 mL  3 mL Intravenous Q12H Doutova, Anastassia, MD   3 mL at 02/22/19 0901  . sodium chloride flush (NS) 0.9 % injection 3 mL  3 mL Intravenous PRN Doutova, Anastassia, MD      . timolol (TIMOPTIC) 0.5 % ophthalmic solution 1 drop  1 drop Right Eye BID Toy Baker, MD   1 drop at 02/22/19 0902  . zolpidem (AMBIEN) tablet 5 mg  5 mg Oral QHS PRN Phillips Grout, MD   5 mg at 02/21/19 2215     Discharge Medications: Please see discharge summary for a list of discharge medications.  Relevant Imaging Results:  Relevant Lab Results:   Additional Information [id number]  Eileen Stanford, LCSW

## 2019-02-23 LAB — D-DIMER, QUANTITATIVE: D-Dimer, Quant: 9.14 ug/mL-FEU — ABNORMAL HIGH (ref 0.00–0.50)

## 2019-02-23 LAB — GLUCOSE, CAPILLARY
Glucose-Capillary: 195 mg/dL — ABNORMAL HIGH (ref 70–99)
Glucose-Capillary: 154 mg/dL — ABNORMAL HIGH (ref 70–99)
Glucose-Capillary: 147 mg/dL — ABNORMAL HIGH (ref 70–99)
Glucose-Capillary: 153 mg/dL — ABNORMAL HIGH (ref 70–99)
Glucose-Capillary: 151 mg/dL — ABNORMAL HIGH (ref 70–99)

## 2019-02-23 LAB — BASIC METABOLIC PANEL
Anion gap: 13 (ref 5–15)
BUN: 29 mg/dL — ABNORMAL HIGH (ref 8–23)
CO2: 26 mmol/L (ref 22–32)
Calcium: 8.8 mg/dL — ABNORMAL LOW (ref 8.9–10.3)
Chloride: 105 mmol/L (ref 98–111)
Creatinine, Ser: 0.78 mg/dL (ref 0.44–1.00)
GFR calc Af Amer: >60 mL/min (ref >60)
GFR calc non Af Amer: >60 mL/min (ref >60)
Glucose, Bld: 203 mg/dL — ABNORMAL HIGH (ref 70–99)
Potassium: 4.1 mmol/L (ref 3.5–5.1)
Sodium: 144 mmol/L (ref 135–145)

## 2019-02-23 LAB — FERRITIN: Ferritin: 642 ng/mL — ABNORMAL HIGH (ref 11–307)

## 2019-02-23 LAB — C-REACTIVE PROTEIN: CRP: <0.8 mg/dL (ref <1.0)

## 2019-02-23 NOTE — Progress Notes (Signed)
Rehab Admissions Coordinator Note:  Patient was screened by Cleatrice Burke for appropriateness for an Inpatient Acute Rehab Consult per PT and OT recs. COVID positive 11/13. COVID positive patient in the [facility] system who are > 20 days since onset of symptoms and have recovered/improved do not require admission testing for patient outside the infectious window. This patient does not meet that requirement, so would need 2 negative tests prior to consideration for CIR admit.   At this time, we are recommending [facility]. Please call me with any questions.  Cleatrice Burke RN MSN 02/23/2019, 8:13 AM  I can be reached at [phone].

## 2019-02-23 NOTE — Progress Notes (Signed)
PROGRESS NOTE    Lindsey Baldwin  [id number] DOB: 14-Apr-1945 DOA: 02/15/2019 PCP: Donald Prose, MD    Brief Narrative:  73 year old female who presented with dyspnea. She does have significant past medical history for hypertension, type 2 diabetes mellitus, GERD, andobstructive sleep apnea. Patient reported 7 days of dyspnea, evaluated in the emergency department November 10,and then November 13, on her second ED visit she was diagnosed with COVID-19 and discharged to assisted living facility. Unfortunately her symptoms continue to deteriorate, he developed hypoxemia and persistent fever. On her initial physical examination her oximetry was 86% on room air,blood pressure 152/70, pulse rate 86, respirate 26, temperature 98.4,she had dry mucous membranes, her lungs were clear to auscultation bilaterally, heart S1-S2 present rhythmic, abdomen soft, no lower extremity edema. Sodium 139, potassium 4.3, chloride 106, bicarb 24, glucose 157, BUN 26, creatinine 0.98, white count 4.6, hemoglobin 13.5, hematocrit 42.8, platelets 132.SARS COVID-19 was positive. Chest radiograph with bilateral interstitial infiltrates, at bases and right upper lobe. EKG 82 bpm, normal axis, sinus rhythm, no ST segment T wave changes.  Patient was admitted to the hospitalwith theworking diagnosis of acute hypoxic respiratory failuredue toSARS COVID-19 viral pneumonia.  Patient was admitted to the progressive care unit, she had persistent high oxygen requirements, high flow nasal cannula, 13 L/min. Received treatment with remdesivir and systemic steroids. November 16 had convalescent plasma andNovember 17, Actemra.  Patient continue to have a high oxygen requirements, follow chest film with dense infiltrate on the left upper lobe with positive air bronchogram. Added antibiotic therapy with ceftriaxone for suspected bacterial over-infection.  Patient with persistent high oxygen requirements.     Assessment & Plan:   Active Problems:   Pneumonia due to COVID-19 virus   Acute respiratory failure due to COVID-19 ([place])   DM2 (diabetes mellitus, type 2) (HCC)   Essential hypertension   Hyperlipemia   OSA on CPAP    1.Acute hypoxic respiratory failure due to SARS COVID-19 viral pneumoniawith bacterial coinfection. Sp convalescent plasma and actemra11/17/20.Conmpleted 5 doses of Remdesivir.  RR: 22 to 24  Pulse oxymetry: 93% Fi02: from 15 to 12 LPM.  COVID-19 Labs  Recent Labs    02/22/19 0252 02/23/19 0120  DDIMER 9.55* 9.14*  FERRITIN 643* 642*  CRP <0.8 <0.8    Lab Results  Component Value Date   SARSCOV2NAA POSITIVE (A) 02/13/2019   [place] Not Detected 01/31/2019    Inflammatory markers are stable, continue to have a high d dimer more than 5, will continue with enoxaparin 40 mg sq 12h. CT chest was negative for PE, noted significant bilateral ground glass opacities, consistent with sever lung injury due to [place] 19.   Tolerating well IV dexamethasone. 6 mg daily #9.  Continue to encourage   prone positiioning. On antitussiveagents,bronchodilator therapy, andairway clearing techniques with flutter valve/incentive spirometer.  Continue with IV ceftriaxone for antibiotic therapy #5/8.   Tolerating well alprazolam for anxiety with good toleration.   Patient at high risk for worsening respiratory failure.  2. T2DM with steroid induced hyperglycemia.Her fasting glucose this am is 203. Will continue current regimen with basal insulin therapy with 15 units and sliding scale, poor oral intake and risk for hypoglycemia.   3. HTN.On enalapril for blood pressure control.  4. Dyslipidemia.On  atorvastatin.  5. Obesity. Her calculated BMI  is 30,4.   DVT prophylaxis:enoxaparin Code Status:full Family Communication:no family at the bedside Disposition Plan/ discharge barriers:pending clinical improvement    Body  mass index is 30.48 kg/m. Malnutrition  Type:      Malnutrition Characteristics:      Nutrition Interventions:     RN Pressure Injury Documentation:     Consultants:     Procedures:     Antimicrobials:       Subjective: Patient is feeling better this am, but continue to be very weak and deconditioned, has been on high flow nasal cannula and non rebreather mask, positive po intake.   Objective: Vitals:   02/22/19 1944 02/22/19 1947 02/22/19 2331 02/23/19 0324  BP: (!) 148/71  (!) 149/77 (!) 147/69  Pulse: 84  81 64  Resp: 20 20 (!) 24 (!) 22  Temp: 99.5 F (37.5 C) 99.5 F (37.5 C) 99.2 F (37.3 C) 98.8 F (37.1 C)  TempSrc: Axillary Axillary Axillary Axillary  SpO2: 93%  93% 93%  Weight:      Height:        Intake/Output Summary (Last 24 hours) at 02/23/2019 0757 Last data filed at 02/22/2019 1944 Gross per 24 hour  Intake 440 ml  Output -  Net 440 ml   Filed Weights   02/15/19 2300  Weight: 75.6 kg    Examination:   General: deconditioned and ill looking appearing.  Neurology: Awake and alert, non focal  E ENT: positive  pallor, no icterus, oral mucosa moist Cardiovascular: No JVD. S1-S2 present, rhythmic, no gallops, rubs, or murmurs. No lower extremity edema. Pulmonary: decreased breath sounds bilaterally. Gastrointestinal. Abdomen flat, no organomegaly, non tender, no rebound or guarding Skin. No rashes Musculoskeletal: no joint deformities     Data Reviewed: I have personally reviewed following labs and imaging studies  CBC: Recent Labs  Lab 02/17/19 0105 02/18/19 0035 02/19/19 1615 02/20/19 0155 02/22/19 0252  WBC 7.8 11.3* 12.5* 13.1* 16.3*  NEUTROABS 6.0 8.6* 9.2* 9.1* 13.0*  HGB 13.7 14.4 14.7 14.7 15.0  HCT 43.7 44.7 45.2 46.6* 47.0*  MCV 93.6 92.7 92.1 95.3 92.9  PLT 165 218 231 251 [id number]   Basic Metabolic Panel: Recent Labs  Lab 02/17/19 0105 02/18/19 0035 02/19/19 0051 02/19/19 1615 02/21/19 0133 02/22/19  0252 02/23/19 0120  NA 140 142 144 145 144 144 144  K 4.6 4.3 4.2 3.9 4.4 4.1 4.1  CL 106 110 110 104 107 107 105  CO2 20* 21* 22 27 24 26 26   GLUCOSE 257* 237* 238* 200* 247* 214* 203*  BUN 25* 30* 33* 34* 34* 35* 29*  CREATININE 0.83 0.93 0.88 0.94 0.85 0.85 0.78  CALCIUM 8.6* 8.7* 8.8* 8.5* 8.7* 9.0 8.8*  MG 2.4 2.7*  --   --   --   --   --    GFR: Estimated Creatinine Clearance: 61.4 mL/min (by C-G formula based on SCr of 0.78 mg/dL). Liver Function Tests: Recent Labs  Lab 02/16/19 0920 02/17/19 0105 02/18/19 0035 02/19/19 0051 02/19/19 1615  AST 80* 71* 60* 49* 92*  ALT 49* 44 43 39 52*  ALKPHOS 35* 38 44 46 51  BILITOT 0.5 0.7 0.8 0.5 0.6  PROT 6.7 6.7 6.6 6.0* 6.4*  ALBUMIN 3.2* 3.2* 3.4* 3.0* 3.4*   No results for input(s): LIPASE, AMYLASE in the last 168 hours. No results for input(s): AMMONIA in the last 168 hours. Coagulation Profile: No results for input(s): INR, PROTIME in the last 168 hours. Cardiac Enzymes: No results for input(s): CKTOTAL, CKMB, CKMBINDEX, TROPONINI in the last 168 hours. BNP (last 3 results) No results for input(s): PROBNP in the last 8760 hours. HbA1C: No results for input(s): HGBA1C in  the last 72 hours. CBG: Recent Labs  Lab 02/21/19 1557 02/21/19 2151 02/22/19 0739 02/22/19 1746 02/22/19 2044  GLUCAP 209* 200* 172* 129* 210*   Lipid Profile: No results for input(s): CHOL, HDL, LDLCALC, TRIG, CHOLHDL, LDLDIRECT in the last 72 hours. Thyroid Function Tests: No results for input(s): TSH, T4TOTAL, FREET4, T3FREE, THYROIDAB in the last 72 hours. Anemia Panel: Recent Labs    02/22/19 0252 02/23/19 0120  FERRITIN 643* 642*      Radiology Studies: I have reviewed all of the imaging during this hospital visit personally     Scheduled Meds: . apraclonidine  1 drop Right Eye BID  . aspirin  81 mg Oral BID  . atorvastatin  40 mg Oral QPM  . chlorpheniramine-HYDROcodone  5 mL Oral Q12H  . dexamethasone (DECADRON)  injection  6 mg Intravenous Q24H  . enalapril  2.5 mg Oral Daily  . enoxaparin (LOVENOX) injection  40 mg Subcutaneous Q12H  . famotidine  20 mg Oral Daily  . insulin aspart  0-15 Units Subcutaneous TID WC  . insulin glargine  15 Units Subcutaneous Daily  . Ipratropium-Albuterol  1 puff Inhalation Q6H  . latanoprost  1 drop Right Eye QHS  . mouth rinse  15 mL Mouth Rinse BID  . mirabegron ER  50 mg Oral Daily  . sodium chloride flush  3 mL Intravenous Q12H  . sodium chloride flush  3 mL Intravenous Q12H  . timolol  1 drop Right Eye BID   Continuous Infusions: . sodium chloride    . cefTRIAXone (ROCEPHIN)  IV Stopped (02/22/19 1300)     LOS: 8 days        Lindsey Baldwin Gerome Apley, MD

## 2019-02-24 DIAGNOSIS — Z09 Encounter for follow-up examination after completed treatment for conditions other than malignant neoplasm: Secondary | ICD-10-CM

## 2019-02-24 LAB — COMPREHENSIVE METABOLIC PANEL
ALT: 50 U/L — ABNORMAL HIGH (ref 0–44)
AST: 47 U/L — ABNORMAL HIGH (ref 15–41)
Albumin: 3.4 g/dL — ABNORMAL LOW (ref 3.5–5.0)
Alkaline Phosphatase: 98 U/L (ref 38–126)
Anion gap: 11 (ref 5–15)
BUN: 29 mg/dL — ABNORMAL HIGH (ref 8–23)
CO2: 27 mmol/L (ref 22–32)
Calcium: 8.9 mg/dL (ref 8.9–10.3)
Chloride: 104 mmol/L (ref 98–111)
Creatinine, Ser: 0.85 mg/dL (ref 0.44–1.00)
GFR calc Af Amer: >60 mL/min (ref >60)
GFR calc non Af Amer: >60 mL/min (ref >60)
Glucose, Bld: 191 mg/dL — ABNORMAL HIGH (ref 70–99)
Potassium: 4.5 mmol/L (ref 3.5–5.1)
Sodium: 142 mmol/L (ref 135–145)
Total Bilirubin: 1.1 mg/dL (ref 0.3–1.2)
Total Protein: 6.1 g/dL — ABNORMAL LOW (ref 6.5–8.1)

## 2019-02-24 LAB — POCT I-STAT 7, (LYTES, BLD GAS, ICA,H+H)
Acid-base deficit: 1 mmol/L (ref 0.0–2.0)
Bicarbonate: 24 mmol/L (ref 20.0–28.0)
Calcium, Ion: 1.26 mmol/L (ref 1.15–1.40)
HCT: 44 % (ref 36.0–46.0)
Hemoglobin: 15 g/dL (ref 12.0–15.0)
O2 Saturation: 84 %
Patient temperature: 97.7
Potassium: 3.7 mmol/L (ref 3.5–5.1)
Sodium: 143 mmol/L (ref 135–145)
TCO2: 25 mmol/L (ref 22–32)
pCO2 arterial: 38 mmHg (ref 32.0–48.0)
pH, Arterial: 7.407 (ref 7.350–7.450)
pO2, Arterial: 47 mmHg — ABNORMAL LOW (ref 83.0–108.0)

## 2019-02-24 LAB — GLUCOSE, CAPILLARY
Glucose-Capillary: 130 mg/dL — ABNORMAL HIGH (ref 70–99)
Glucose-Capillary: 139 mg/dL — ABNORMAL HIGH (ref 70–99)
Glucose-Capillary: 121 mg/dL — ABNORMAL HIGH (ref 70–99)
Glucose-Capillary: 189 mg/dL — ABNORMAL HIGH (ref 70–99)

## 2019-02-24 LAB — D-DIMER, QUANTITATIVE: D-Dimer, Quant: 6.3 ug/mL-FEU — ABNORMAL HIGH (ref 0.00–0.50)

## 2019-02-24 LAB — C-REACTIVE PROTEIN: CRP: <0.8 mg/dL (ref <1.0)

## 2019-02-24 LAB — FERRITIN: Ferritin: 588 ng/mL — ABNORMAL HIGH (ref 11–307)

## 2019-02-24 NOTE — Progress Notes (Signed)
PROGRESS NOTE    Lindsey Baldwin  [id number] DOB: February 20, 1946 DOA: 02/15/2019 PCP: Donald Prose, MD    Brief Narrative:  73 year old female who presented with dyspnea. She does have significant past medical history for hypertension, type 2 diabetes mellitus, GERD, andobstructive sleep apnea. Patient reported 7 days of dyspnea, evaluated in the emergency department November 10,and then November 13, on her second ED visit she was diagnosed with COVID-19 and discharged to assisted living facility. Unfortunately her symptoms continue to deteriorate, he developed hypoxemia and persistent fever. On her initial physical examination her oximetry was 86% on room air,blood pressure 152/70, pulse rate 86, respirate 26, temperature 98.4,she had dry mucous membranes, her lungs were clear to auscultation bilaterally, heart S1-S2 present rhythmic, abdomen soft, no lower extremity edema. Sodium 139, potassium 4.3, chloride 106, bicarb 24, glucose 157, BUN 26, creatinine 0.98, white count 4.6, hemoglobin 13.5, hematocrit 42.8, platelets 132.SARS COVID-19 was positive. Chest radiograph with bilateral interstitial infiltrates, at bases and right upper lobe. EKG 82 bpm, normal axis, sinus rhythm, no ST segment T wave changes.  Patient was admitted to the hospitalwith theworking diagnosis of acute hypoxic respiratory failuredue toSARS COVID-19 viral pneumonia.  Patient was admitted to the progressive care unit, she had persistent high oxygen requirements, high flow nasal cannula, 13 L/min. Received treatment with remdesivir and systemic steroids. November 16 had convalescent plasma andNovember 17, Actemra.  Patient continue to have a high oxygen requirements, follow chest film with dense infiltrate on the left upper lobe with positive air bronchogram. Added antibiotic therapy with ceftriaxone for suspected bacterial over-infection.  Patient with persistent high oxygen requirements.CT  chest was negative for PE, noted significant bilateral ground glass opacities, consistent with severe lung injury due to [place] 19.    Assessment & Plan:   Active Problems:   Pneumonia due to COVID-19 virus   Acute respiratory failure due to COVID-19 ([place])   DM2 (diabetes mellitus, type 2) (HCC)   Essential hypertension   Hyperlipemia   OSA on CPAP   1.Acute hypoxic respiratory failure due to SARS COVID-19 viral pneumoniawith bacterial coinfection. Sp convalescent plasma and actemra11/17/20.Conmpleted 5 doses of Remdesivir.  RR: 16 to 21  Pulse oxymetry: 92 to 93  Fi02: 15 LPM HFNC and non rebreather mask.   COVID-19 Labs  Recent Labs    02/22/19 0252 02/23/19 0120 02/24/19 0244  DDIMER 9.55* 9.14* 6.30*  FERRITIN 643* 642* 588*  CRP <0.8 <0.8 <0.8    Lab Results  Component Value Date   SARSCOV2NAA POSITIVE (A) 02/13/2019   [place] Not Detected 01/31/2019     Inflammatory markers are trending down today. continue to have a   Continue with  IVdexamethasone.6 mg daily #10, will start taper in am.  Encourage prone positiioning as tolerated Continue with antitussiveagents,bronchodilator therapy, andairway clearing techniques with flutter valve/incentive spirometer.  Tolerating well IV ceftriaxone forantibiotic therapy #6/8.   Continue with as neededl alprazolam for anxiety.  She continues at high risk for respiratory failure.   2. T2DM with steroid induced hyperglycemia.Glucose has improved with fasting glucose at 191, capillary 130 and 139, will continue basal insulin (15 units) and sliding scale insulin.   3. HTN.Continue blood pressure control with enalaprill.  4. Dyslipidemia.Continue withatorvastatin.  5. Obesity.  BMIis 30,4.   DVT prophylaxis:enoxaparin Code Status:full Family Communication:no family at the bedside Disposition Plan/ discharge barriers:pending clinical improvement    Body  mass index is 30.48 kg/m. Malnutrition Type:      Malnutrition Characteristics:  Nutrition Interventions:     RN Pressure Injury Documentation:     Consultants:     Procedures:     Antimicrobials:   Ceftriaxone    Subjective: Patient with slowly improving dyspnea, but continue to use non rabreather mask plus high flow nasal cannula. Tolerating po, but poor oral intake, no nausea or vomiting.   Objective: Vitals:   02/23/19 1940 02/24/19 0035 02/24/19 0420 02/24/19 0722  BP: 136/74 (!) 141/70 133/71 (!) 156/76  Pulse: 75 74 71   Resp: 18 19 16  (!) 21  Temp: 99 F (37.2 C) 98.8 F (37.1 C) 98.6 F (37 C) (!) 97.4 F (36.3 C)  TempSrc: Axillary Axillary Axillary Axillary  SpO2: 98% 92% 92% 93%  Weight:      Height:        Intake/Output Summary (Last 24 hours) at 02/24/2019 0752 Last data filed at 02/23/2019 1940 Gross per 24 hour  Intake 200 ml  Output -  Net 200 ml   Filed Weights   02/15/19 2300  Weight: 75.6 kg    Examination:   General: Not in pain, but dyspnea at rest, deconditioned.  Neurology: Awake and alert, non focal  E ENT: positive pallor, no icterus, oral mucosa moist Cardiovascular: No JVD. S1-S2 present, rhythmic, no gallops, rubs, or murmurs. No lower extremity edema. Pulmonary: positive breath sounds bilaterally. Gastrointestinal. Abdomen with no organomegaly, non tender, no rebound or guarding Skin. No rashes Musculoskeletal: no joint deformities     Data Reviewed: I have personally reviewed following labs and imaging studies  CBC: Recent Labs  Lab 02/18/19 0035 02/19/19 1615 02/20/19 0155 02/22/19 0252  WBC 11.3* 12.5* 13.1* 16.3*  NEUTROABS 8.6* 9.2* 9.1* 13.0*  HGB 14.4 14.7 14.7 15.0  HCT 44.7 45.2 46.6* 47.0*  MCV 92.7 92.1 95.3 92.9  PLT 218 231 251 [id number]   Basic Metabolic Panel: Recent Labs  Lab 02/18/19 0035  02/19/19 1615 02/21/19 0133 02/22/19 0252 02/23/19 0120 02/24/19 0244  NA 142   <  > 145 144 144 144 142  K 4.3   < > 3.9 4.4 4.1 4.1 4.5  CL 110   < > 104 107 107 105 104  CO2 21*   < > 27 24 26 26 27   GLUCOSE 237*   < > 200* 247* 214* 203* 191*  BUN 30*   < > 34* 34* 35* 29* 29*  CREATININE 0.93   < > 0.94 0.85 0.85 0.78 0.85  CALCIUM 8.7*   < > 8.5* 8.7* 9.0 8.8* 8.9  MG 2.7*  --   --   --   --   --   --    < > = values in this interval not displayed.   GFR: Estimated Creatinine Clearance: 57.8 mL/min (by C-G formula based on SCr of 0.85 mg/dL). Liver Function Tests: Recent Labs  Lab 02/18/19 0035 02/19/19 0051 02/19/19 1615 02/24/19 0244  AST 60* 49* 92* 47*  ALT 43 39 52* 50*  ALKPHOS 44 46 51 98  BILITOT 0.8 0.5 0.6 1.1  PROT 6.6 6.0* 6.4* 6.1*  ALBUMIN 3.4* 3.0* 3.4* 3.4*   No results for input(s): LIPASE, AMYLASE in the last 168 hours. No results for input(s): AMMONIA in the last 168 hours. Coagulation Profile: No results for input(s): INR, PROTIME in the last 168 hours. Cardiac Enzymes: No results for input(s): CKTOTAL, CKMB, CKMBINDEX, TROPONINI in the last 168 hours. BNP (last 3 results) No results for input(s): PROBNP in the last 8760 hours.  HbA1C: No results for input(s): HGBA1C in the last 72 hours. CBG: Recent Labs  Lab 02/23/19 0828 02/23/19 1131 02/23/19 1724 02/23/19 2200 02/24/19 0720  GLUCAP 154* 147* 153* 151* 130*   Lipid Profile: No results for input(s): CHOL, HDL, LDLCALC, TRIG, CHOLHDL, LDLDIRECT in the last 72 hours. Thyroid Function Tests: No results for input(s): TSH, T4TOTAL, FREET4, T3FREE, THYROIDAB in the last 72 hours. Anemia Panel: Recent Labs    02/23/19 0120 02/24/19 0244  FERRITIN 642* 588*      Radiology Studies: I have reviewed all of the imaging during this hospital visit personally     Scheduled Meds: . apraclonidine  1 drop Right Eye BID  . aspirin  81 mg Oral BID  . atorvastatin  40 mg Oral QPM  . chlorpheniramine-HYDROcodone  5 mL Oral Q12H  . dexamethasone (DECADRON) injection  6 mg  Intravenous Q24H  . enalapril  2.5 mg Oral Daily  . enoxaparin (LOVENOX) injection  40 mg Subcutaneous Q12H  . famotidine  20 mg Oral Daily  . insulin aspart  0-15 Units Subcutaneous TID WC  . insulin glargine  15 Units Subcutaneous Daily  . Ipratropium-Albuterol  1 puff Inhalation Q6H  . latanoprost  1 drop Right Eye QHS  . mouth rinse  15 mL Mouth Rinse BID  . mirabegron ER  50 mg Oral Daily  . sodium chloride flush  3 mL Intravenous Q12H  . sodium chloride flush  3 mL Intravenous Q12H  . timolol  1 drop Right Eye BID   Continuous Infusions: . sodium chloride    . cefTRIAXone (ROCEPHIN)  IV 1 g (02/23/19 1128)     LOS: 9 days         Gerome Apley, MD

## 2019-02-24 NOTE — Progress Notes (Signed)
Pts Friend called and updated and all questions answered.

## 2019-02-24 NOTE — Progress Notes (Signed)
Md made aware of current MEWS yellow due to RR. VSS in place q2h to follow protocol. Awaiting MD to assess pt at bedside.

## 2019-02-24 NOTE — Plan of Care (Signed)
Plan of care discussed with patient and family, all questions answered

## 2019-02-24 NOTE — Progress Notes (Signed)
Notified MD of pt increase in some confusion, pulling oxygen off, more lethargic. ABG ordered. Results given to MD who is in route to come assess pt.

## 2019-02-24 NOTE — Progress Notes (Signed)
Physical Therapy Treatment Patient Details Name: Lindsey Baldwin MRN: [id number] DOB: 11/01/1945 Today's Date: 02/24/2019    History of Present Illness 73 y/o female w/ hx of DM II, osteoporosis, OSA on CPAP, OAB. OA. HTN, galucoma, GERD, bronchitis, L blind eye, R knee arthroplasty. Pt tested + for COVID 11/13. evaluated in the emergency department November 10, and then November 13, on her second ED visit she was diagnosed with COVID-19 and discharged to assisted living facility. her sx continue to deteriorate, she developed hypoxemia and persistent fever hence returned to hospital.    PT Comments    The  Patient received on HFNC 15L and NRB intermittently during mobility. SPO2 dropping into hi 60's on HFNC 15 , HR 70's, RR high 30'. Placed on NRB in addition with SPO2 increasing  Back to 90's over ~ 1.5 minutes. Dyspnea 4/4. Placed Nelcor probe to compare to peds probe on ear with  Similar pleth when in 60%'s but ear reads higher by 8 pts than forehead when recovered(ear 95, forehead 88%). Pt. Continues to be very compromised for activity with desaturation with minimal activity. Continue progressive mobility and monitoring o SPO2-forehead when posssible.  Follow Up Recommendations  ;SNF     Equipment Recommendations  None recommended by PT    Recommendations for Other Services       Precautions / Restrictions Precautions Precaution Comments: on 15L via HFNC and NRB and sats at rest in mid-high 80s, w/ activity drops to 60's with activity- on ear.. Nelcor probe in cabinet   Mobility  Bed Mobility   Bed Mobility: Rolling;Sidelying to Sit Rolling: Supervision Sidelying to sit: Min guard       General bed mobility comments: extra time to push self  up  Transfers Overall transfer level: Needs assistance Equipment used: Rolling walker (2 wheeled) Transfers: Sit to/from [facility] Sit to Stand: Supervision Stand pivot transfers: Supervision        General transfer comment: Assist with line management to [facility], then backed up with RW x 5 stps from [facility] to recliner. Patient on 15 L HFNC at 66%d safety with FWW  Ambulation/Gait                 Stairs             Wheelchair Mobility    Modified Rankin (Stroke Patients Only)       Balance                                            Cognition   Behavior During Therapy: Anxious Overall Cognitive Status: Within Functional Limits for tasks assessed                                        Exercises      General Comments        Pertinent Vitals/Pain Pain Assessment: No/denies pain    Home Living                      Prior Function            PT Goals (current goals can now be found in the care plan section)      Frequency    Min 2X/week      PT  Plan Current plan remains appropriate;Frequency needs to be updated    Co-evaluation              AM-PAC PT "6 Clicks" Mobility   Outcome Measure  Help needed turning from your back to your side while in a flat bed without using bedrails?: A Little Help needed moving from lying on your back to sitting on the side of a flat bed without using bedrails?: A Little Help needed moving to and from a bed to a chair (including a wheelchair)?: A Little Help needed standing up from a chair using your arms (e.g., wheelchair or bedside chair)?: A Lot Help needed to walk in hospital room?: A Lot Help needed climbing 3-5 steps with a railing? : Total 6 Click Score: 14    End of Session Equipment Utilized During Treatment: Oxygen Activity Tolerance: Treatment limited secondary to medical complications (Comment) Patient left: in chair;with call bell/phone within reach Nurse Communication: Mobility status;Other (comment) PT Visit Diagnosis: Other abnormalities of gait and mobility (R26.89);Muscle weakness (generalized) (M62.81)     Time: [id number] PT Time  Calculation (min) (ACUTE ONLY): 72 min  Charges:  $Therapeutic Activity: 38-52 mins $Self Care/Home Management: [place]  Office [phone]    Claretha Cooper 02/24/2019, 2:09 PM

## 2019-02-25 LAB — GLUCOSE, CAPILLARY
Glucose-Capillary: 137 mg/dL — ABNORMAL HIGH (ref 70–99)
Glucose-Capillary: 171 mg/dL — ABNORMAL HIGH (ref 70–99)
Glucose-Capillary: 153 mg/dL — ABNORMAL HIGH (ref 70–99)
Glucose-Capillary: 105 mg/dL — ABNORMAL HIGH (ref 70–99)

## 2019-02-25 LAB — COMPREHENSIVE METABOLIC PANEL
ALT: 40 U/L (ref 0–44)
AST: 40 U/L (ref 15–41)
Albumin: 3 g/dL — ABNORMAL LOW (ref 3.5–5.0)
Alkaline Phosphatase: 103 U/L (ref 38–126)
Anion gap: 12 (ref 5–15)
BUN: 27 mg/dL — ABNORMAL HIGH (ref 8–23)
CO2: 24 mmol/L (ref 22–32)
Calcium: 8.8 mg/dL — ABNORMAL LOW (ref 8.9–10.3)
Chloride: 108 mmol/L (ref 98–111)
Creatinine, Ser: 0.69 mg/dL (ref 0.44–1.00)
GFR calc Af Amer: >60 mL/min (ref >60)
GFR calc non Af Amer: >60 mL/min (ref >60)
Glucose, Bld: 128 mg/dL — ABNORMAL HIGH (ref 70–99)
Potassium: 4.4 mmol/L (ref 3.5–5.1)
Sodium: 144 mmol/L (ref 135–145)
Total Bilirubin: 0.8 mg/dL (ref 0.3–1.2)
Total Protein: 5.7 g/dL — ABNORMAL LOW (ref 6.5–8.1)

## 2019-02-25 LAB — C-REACTIVE PROTEIN: CRP: <0.8 mg/dL (ref <1.0)

## 2019-02-25 LAB — FERRITIN: Ferritin: 464 ng/mL — ABNORMAL HIGH (ref 11–307)

## 2019-02-25 LAB — D-DIMER, QUANTITATIVE: D-Dimer, Quant: 3.84 ug/mL-FEU — ABNORMAL HIGH (ref 0.00–0.50)

## 2019-02-25 MED ORDER — PREDNISONE 20 MG PO TABS
20.0000 mg | ORAL_TABLET | Freq: Every day | ORAL | Status: DC
  Administered 2019-02-25 – 2019-03-01 (×5): 20 mg via ORAL
  Filled 2019-02-25 (×5): qty 1

## 2019-02-25 NOTE — Progress Notes (Signed)
Pt friend called and updated. All questions answered.

## 2019-02-25 NOTE — Progress Notes (Signed)
PROGRESS NOTE    Lindsey Baldwin  [id number] DOB: December 27, 1945 DOA: 02/15/2019 PCP: Donald Prose, MD    Brief Narrative:  73 year old female who presented with dyspnea. She does have significant past medical history for hypertension, type 2 diabetes mellitus, GERD, andobstructive sleep apnea. Patient reported 7 days of dyspnea, evaluated in the emergency department November 10,and then November 13, on her second ED visit she was diagnosed with COVID-19 and discharged to assisted living facility. Unfortunately her symptoms continue to deteriorate, he developed hypoxemia and persistent fever. On her initial physical examination her oximetry was 86% on room air,blood pressure 152/70, pulse rate 86, respirate 26, temperature 98.4,she had dry mucous membranes, her lungs were clear to auscultation bilaterally, heart S1-S2 present rhythmic, abdomen soft, no lower extremity edema. Sodium 139, potassium 4.3, chloride 106, bicarb 24, glucose 157, BUN 26, creatinine 0.98, white count 4.6, hemoglobin 13.5, hematocrit 42.8, platelets 132.SARS COVID-19 was positive. Chest radiograph with bilateral interstitial infiltrates, at bases and right upper lobe. EKG 82 bpm, normal axis, sinus rhythm, no ST segment T wave changes.  Patient was admitted to the hospitalwith theworking diagnosis of acute hypoxic respiratory failuredue toSARS COVID-19 viral pneumonia.  Patient was admitted to the progressive care unit, she had persistent high oxygen requirements, high flow nasal cannula, 13 L/min. Received treatment with remdesivir and systemic steroids. November 16 had convalescent plasma andNovember 17, Actemra.  Patient continue to have a high oxygen requirements, follow chest film with dense infiltrate on the left upper lobe with positive air bronchogram. Added antibiotic therapy with ceftriaxone for suspected bacterial over-infection.  Patient with persistent high oxygen requirements.CT  chest was negative for PE, noted significant bilateral ground glass opacities, consistent with severe lung injury due to [place] 19.   Assessment & Plan:   Active Problems:   Pneumonia due to COVID-19 virus   Acute respiratory failure due to COVID-19 ([place])   DM2 (diabetes mellitus, type 2) (HCC)   Essential hypertension   Hyperlipemia   OSA on CPAP   1.Acute hypoxic respiratory failure due to SARS COVID-19 viral pneumoniawith bacterial coinfection. Sp convalescent plasma and actemra11/17/20.Conmpleted 5 doses of Remdesivir.  RR: 21  Pulse oxymetry: 96%  Fi02: 15 LPM HFNC and Non rebreather mask.  COVID-19 Labs  Recent Labs    02/23/19 0120 02/24/19 0244 02/25/19 0600  DDIMER 9.14* 6.30* 3.84*  FERRITIN 642* 588* 464*  CRP <0.8 <0.8 <0.8    Lab Results  Component Value Date   SARSCOV2NAA POSITIVE (A) 02/13/2019   [place] Not Detected 01/31/2019     Continue to trend down Inflammatory markers.   OnIVdexamethasone patient had 10 days of dexamethasone, will change to oral prednisone for now 20 mg per day. Patient has been onproneposition at night, continue with antitussiveagents,bronchodilator therapy, andairway clearing techniques with flutter valve/incentive spirometer.  IV ceftriaxone forantibiotic therapy #7/8.  Onalprazolam for anxiety.  Patient continues at high risk for respiratory failure.   2. T2DM with steroid induced hyperglycemia. Fasting glucose at 128, continue with basal insulin (15 units) and sliding scale insulin, for glucose cover and monitoring.   3. HTN.On enalapril for blood pressure control.   4. Dyslipidemia.Onatorvastatin.  5. Obesity. BMIcalculated is 30,4.   DVT prophylaxis:enoxaparin Code Status:full Family Communication:no family at the bedside Disposition Plan/ discharge barriers:pending clinical improvement    Body mass index is 30.48 kg/m. Malnutrition Type:       Malnutrition Characteristics:      Nutrition Interventions:     RN Pressure Injury Documentation:  Consultants:     Procedures:     Antimicrobials:   Ceftriaxone     Subjective: Patient continue to have persistent dyspnea, has been prone at night, this am is up in the chair. No nausea or vomiting, continue to be very weak and deconditioned.   Objective: Vitals:   02/25/19 0015 02/25/19 0240 02/25/19 0430 02/25/19 0704  BP: 124/66  (!) 145/51 136/88  Pulse: 65 68 75 77  Resp: 20 19 (!) 21 (!) 21  Temp: 98.6 F (37 C)  98.1 F (36.7 C) 98.8 F (37.1 C)  TempSrc: Axillary  Axillary Axillary  SpO2: 98% 96% 94% 96%  Weight:      Height:        Intake/Output Summary (Last 24 hours) at 02/25/2019 0849 Last data filed at 02/24/2019 1200 Gross per 24 hour  Intake 220 ml  Output -  Net 220 ml   Filed Weights   02/15/19 2300  Weight: 75.6 kg    Examination:   General: positive dyspnea, deconditioned  Neurology: Awake and alert, non focal  E ENT: mild pallor, no icterus, oral mucosa moist Cardiovascular: No JVD. S1-S2 present, rhythmic, no gallops, rubs, or murmurs. No lower extremity edema. Pulmonary: positive breath sounds bilaterally. Gastrointestinal. Abdomen with no organomegaly, non tender, no rebound or guarding Skin. No rashes Musculoskeletal: no joint deformities     Data Reviewed: I have personally reviewed following labs and imaging studies  CBC: Recent Labs  Lab 02/19/19 1615 02/20/19 0155 02/22/19 0252 02/24/19 1412  WBC 12.5* 13.1* 16.3*  --   NEUTROABS 9.2* 9.1* 13.0*  --   HGB 14.7 14.7 15.0 15.0  HCT 45.2 46.6* 47.0* 44.0  MCV 92.1 95.3 92.9  --   PLT 231 251 269  --    Basic Metabolic Panel: Recent Labs  Lab 02/21/19 0133 02/22/19 0252 02/23/19 0120 02/24/19 0244 02/24/19 1412 02/25/19 0600  NA 144 144 144 142 143 144  K 4.4 4.1 4.1 4.5 3.7 4.4  CL 107 107 105 104  --  108  CO2 24 26 26 27   --  24  GLUCOSE  247* 214* 203* 191*  --  128*  BUN 34* 35* 29* 29*  --  27*  CREATININE 0.85 0.85 0.78 0.85  --  0.69  CALCIUM 8.7* 9.0 8.8* 8.9  --  8.8*   GFR: Estimated Creatinine Clearance: 61.4 mL/min (by C-G formula based on SCr of 0.69 mg/dL). Liver Function Tests: Recent Labs  Lab 02/19/19 0051 02/19/19 1615 02/24/19 0244 02/25/19 0600  AST 49* 92* 47* 40  ALT 39 52* 50* 40  ALKPHOS 46 51 98 103  BILITOT 0.5 0.6 1.1 0.8  PROT 6.0* 6.4* 6.1* 5.7*  ALBUMIN 3.0* 3.4* 3.4* 3.0*   No results for input(s): LIPASE, AMYLASE in the last 168 hours. No results for input(s): AMMONIA in the last 168 hours. Coagulation Profile: No results for input(s): INR, PROTIME in the last 168 hours. Cardiac Enzymes: No results for input(s): CKTOTAL, CKMB, CKMBINDEX, TROPONINI in the last 168 hours. BNP (last 3 results) No results for input(s): PROBNP in the last 8760 hours. HbA1C: No results for input(s): HGBA1C in the last 72 hours. CBG: Recent Labs  Lab 02/24/19 0720 02/24/19 1146 02/24/19 1554 02/24/19 2005 02/25/19 0703  GLUCAP 130* 139* 121* 189* 137*   Lipid Profile: No results for input(s): CHOL, HDL, LDLCALC, TRIG, CHOLHDL, LDLDIRECT in the last 72 hours. Thyroid Function Tests: No results for input(s): TSH, T4TOTAL, FREET4, T3FREE, THYROIDAB in  the last 72 hours. Anemia Panel: Recent Labs    02/24/19 0244 02/25/19 0600  FERRITIN 588* 464*      Radiology Studies: I have reviewed all of the imaging during this hospital visit personally     Scheduled Meds: . apraclonidine  1 drop Right Eye BID  . aspirin  81 mg Oral BID  . atorvastatin  40 mg Oral QPM  . chlorpheniramine-HYDROcodone  5 mL Oral Q12H  . dexamethasone (DECADRON) injection  6 mg Intravenous Q24H  . enalapril  2.5 mg Oral Daily  . enoxaparin (LOVENOX) injection  40 mg Subcutaneous Q12H  . famotidine  20 mg Oral Daily  . insulin aspart  0-15 Units Subcutaneous TID WC  . insulin glargine  15 Units Subcutaneous  Daily  . Ipratropium-Albuterol  1 puff Inhalation Q6H  . latanoprost  1 drop Right Eye QHS  . mouth rinse  15 mL Mouth Rinse BID  . mirabegron ER  50 mg Oral Daily  . sodium chloride flush  3 mL Intravenous Q12H  . sodium chloride flush  3 mL Intravenous Q12H  . timolol  1 drop Right Eye BID   Continuous Infusions: . sodium chloride    . cefTRIAXone (ROCEPHIN)  IV 1 g (02/25/19 0837)     LOS: 10 days        Hina Gupta Gerome Apley, MD

## 2019-02-25 NOTE — Progress Notes (Signed)
Occupational Therapy Treatment Patient Details Name: Lindsey Baldwin MRN: [id number] DOB: 12-Dec-1945 Today's Date: 02/25/2019    History of present illness 73 y/o female w/ hx of DM II, osteoporosis, OSA on CPAP, OAB. OA. HTN, galucoma, GERD, bronchitis, L blind eye, R knee arthroplasty. Pt tested + for COVID 11/13. evaluated in the emergency department November 10, and then November 13, on her second ED visit she was diagnosed with COVID-19 and discharged to assisted living facility. her sx continue to deteriorate, she developed hypoxemia and persistent fever hence returned to hospital.   OT comments  Pt progressing with activity tolerance, able to engage in multiple self-care tasks this date with O2 fluctuating between 86% and 97% on 15L HFNC and 15L NRB. 2/4 DOE. Continued education with pt on relaxation strategies when anxiety presents, noting good recall from previous session. Pt required min rest breaks throughout due to fatigue. Pt continues to demonstrate decreased strength, endurance, balance, standing tolerance, and activity tolerance impacting ability to complete self-care and functional transfer tasks. Recommend continued skilled OT services to address above deficits in order to promote function and prevent further decline. Recommend SNF placement following hospital discharge for continued rehab.    Follow Up Recommendations  SNF    Equipment Recommendations       Recommendations for Other Services      Precautions / Restrictions Precautions Precautions: Fall Restrictions Weight Bearing Restrictions: No       Mobility Bed Mobility Overal bed mobility: Needs Assistance Bed Mobility: Sit to Supine       Sit to supine: Supervision      Transfers Overall transfer level: Needs assistance Equipment used: Rolling walker (2 wheeled) Transfers: Sit to/from [facility] Sit to Stand: Min guard Stand pivot transfers: Min guard       General  transfer comment: Pt needs assist with line management. Cues for safety and activity pacing to prevent future falls    Balance Overall balance assessment: Needs assistance   Sitting balance-Leahy Scale: Good       Standing balance-Leahy Scale: Fair                             ADL either performed or assessed with clinical judgement   ADL Overall ADL's : Needs assistance/impaired     Grooming: Set up;Sitting   Upper Body Bathing: Sitting;Minimal assistance;Moderate assistance   Lower Body Bathing: Minimal assistance;Sitting/lateral leans;Moderate assistance       Lower Body Dressing: Minimal assistance;Sit to/from stand               Functional mobility during ADLs: Min guard;Rolling walker General ADL Comments: Pt completed simple sponge bathing task while seated. Pt able to don clean brief while seated, requiring min assist to doff soiled brief.      Vision       Perception     Praxis      Cognition Arousal/Alertness: Awake/alert Behavior During Therapy: WFL for tasks assessed/performed Overall Cognitive Status: Within Functional Limits for tasks assessed                                          Exercises Exercises: Other exercises Other Exercises Other Exercises: Pursed lip breathing exercises with min cues on technique   Shoulder Instructions       General Comments Continued education with pt on  safety strategies, activity modifications, and energy conservation techniques for self-care and functional transfer tasks. O2 SATs 86%-97% on 15L HFNC and 15L NRB.     Pertinent Vitals/ Pain       Pain Assessment: No/denies pain  Home Living                                          Prior Functioning/Environment              Frequency  Min 2X/week        Progress Toward Goals  OT Goals(current goals can now be found in the care plan section)  Progress towards OT goals: Progressing toward  goals  ADL Goals Pt Will Perform Grooming: with supervision;standing Pt Will Perform Lower Body Dressing: with supervision;sit to/from stand;sitting/lateral leans Pt Will Transfer to Toilet: with supervision;ambulating Pt Will Perform Toileting - Clothing Manipulation and hygiene: with supervision;sit to/from stand Additional ADL Goal #1: Pt will recall and demonstrate breathing exercises with 0 verbal cues required.  Plan Frequency needs to be updated;Discharge plan needs to be updated    Co-evaluation                 AM-PAC OT "6 Clicks" Daily Activity     Outcome Measure   Help from another person eating meals?: None Help from another person taking care of personal grooming?: A Little Help from another person toileting, which includes using toliet, bedpan, or urinal?: A Little Help from another person bathing (including washing, rinsing, drying)?: A Lot Help from another person to put on and taking off regular upper body clothing?: A Little Help from another person to put on and taking off regular lower body clothing?: A Little 6 Click Score: 18    End of Session Equipment Utilized During Treatment: Rolling walker;Oxygen  OT Visit Diagnosis: Unsteadiness on feet (R26.81);Muscle weakness (generalized) (M62.81)   Activity Tolerance Patient limited by fatigue(Limited by SOB)   Patient Left in bed;with call bell/phone within reach   Nurse Communication Mobility status        Time: [id number] OT Time Calculation (min): 19 min  Charges: OT General Charges $OT Visit: 1 Visit OT Treatments $Self Care/Home Management : 8-22 mins  Mauri Brooklyn OTR/L [phone]    Mauri Brooklyn 02/25/2019, 3:25 PM

## 2019-02-26 LAB — C-REACTIVE PROTEIN: CRP: <0.8 mg/dL (ref <1.0)

## 2019-02-26 LAB — COMPREHENSIVE METABOLIC PANEL
ALT: 38 U/L (ref 0–44)
AST: 41 U/L (ref 15–41)
Albumin: 3.2 g/dL — ABNORMAL LOW (ref 3.5–5.0)
Alkaline Phosphatase: 119 U/L (ref 38–126)
Anion gap: 10 (ref 5–15)
BUN: 26 mg/dL — ABNORMAL HIGH (ref 8–23)
CO2: 22 mmol/L (ref 22–32)
Calcium: 8.5 mg/dL — ABNORMAL LOW (ref 8.9–10.3)
Chloride: 106 mmol/L (ref 98–111)
Creatinine, Ser: 0.64 mg/dL (ref 0.44–1.00)
GFR calc Af Amer: >60 mL/min (ref >60)
GFR calc non Af Amer: >60 mL/min (ref >60)
Glucose, Bld: 104 mg/dL — ABNORMAL HIGH (ref 70–99)
Potassium: 4.1 mmol/L (ref 3.5–5.1)
Sodium: 138 mmol/L (ref 135–145)
Total Bilirubin: 1 mg/dL (ref 0.3–1.2)
Total Protein: 5.8 g/dL — ABNORMAL LOW (ref 6.5–8.1)

## 2019-02-26 LAB — GLUCOSE, CAPILLARY
Glucose-Capillary: 104 mg/dL — ABNORMAL HIGH (ref 70–99)
Glucose-Capillary: 221 mg/dL — ABNORMAL HIGH (ref 70–99)
Glucose-Capillary: 214 mg/dL — ABNORMAL HIGH (ref 70–99)
Glucose-Capillary: 111 mg/dL — ABNORMAL HIGH (ref 70–99)

## 2019-02-26 LAB — D-DIMER, QUANTITATIVE: D-Dimer, Quant: 3.32 ug/mL-FEU — ABNORMAL HIGH (ref 0.00–0.50)

## 2019-02-26 LAB — FERRITIN: Ferritin: 399 ng/mL — ABNORMAL HIGH (ref 11–307)

## 2019-02-26 MED ORDER — BISACODYL 5 MG PO TBEC
5.0000 mg | DELAYED_RELEASE_TABLET | Freq: Once | ORAL | Status: AC
  Administered 2019-02-26: 10:00:00 5 mg via ORAL
  Filled 2019-02-26: qty 1

## 2019-02-26 MED ORDER — POLYETHYLENE GLYCOL 3350 17 G PO PACK
17.0000 g | PACK | Freq: Two times a day (BID) | ORAL | Status: DC
  Administered 2019-02-26 – 2019-03-13 (×26): 17 g via ORAL
  Filled 2019-02-26 (×28): qty 1

## 2019-02-26 NOTE — Progress Notes (Signed)
Patient declined updating friend, Seth Bake. She states that she spoke with the physician earlier in the day and that there is nothing new to report for tonight.

## 2019-02-26 NOTE — Progress Notes (Signed)
Friend called she is updated all questions answered

## 2019-02-26 NOTE — Progress Notes (Signed)
PROGRESS NOTE    Lindsey Baldwin  [id number] DOB: 04-Apr-1945 DOA: 02/15/2019 PCP: Donald Prose, MD    Brief Narrative:  73 year old female who presented with dyspnea. She does have significant past medical history for hypertension, type 2 diabetes mellitus, GERD, andobstructive sleep apnea. Patient reported 7 days of dyspnea, evaluated in the emergency department November 10,and then November 13, on her second ED visit she was diagnosed with COVID-19 and discharged to assisted living facility. Unfortunately her symptoms continue to deteriorate, he developed hypoxemia and persistent fever. On her initial physical examination her oximetry was 86% on room air,blood pressure 152/70, pulse rate 86, respirate 26, temperature 98.4,she had dry mucous membranes, her lungs were clear to auscultation bilaterally, heart S1-S2 present rhythmic, abdomen soft, no lower extremity edema. Sodium 139, potassium 4.3, chloride 106, bicarb 24, glucose 157, BUN 26, creatinine 0.98, white count 4.6, hemoglobin 13.5, hematocrit 42.8, platelets 132.SARS COVID-19 was positive. Chest radiograph with bilateral interstitial infiltrates, at bases and right upper lobe. EKG 82 bpm, normal axis, sinus rhythm, no ST segment T wave changes.  Patient was admitted to the hospitalwith theworking diagnosis of acute hypoxic respiratory failuredue toSARS COVID-19 viral pneumonia.  Patient was admitted to the progressive care unit, she had persistent high oxygen requirements, high flow nasal cannula, 13 L/min. Received treatment with remdesivir and systemic steroids. November 16 had convalescent plasma andNovember 17, Actemra.  Patient continue to have a high oxygen requirements, follow chest film with dense infiltrate on the left upper lobe with positive air bronchogram. Added antibiotic therapy with ceftriaxone for suspected bacterial over-infection.  Patient with persistent high oxygen requirements.CT  chest was negative for PE, noted significant bilateral ground glass opacities, consistent with severelung injury due to [place] 19.   Assessment & Plan:   Active Problems:   Pneumonia due to COVID-19 virus   Acute respiratory failure due to COVID-19 ([place])   DM2 (diabetes mellitus, type 2) (HCC)   Essential hypertension   Hyperlipemia   OSA on CPAP  1.Acute hypoxic respiratory failure due to SARS COVID-19 viral pneumoniawith bacterial coinfection. Sp convalescent plasma and actemra11/17/20.Conmpleted 5 doses of Remdesivir.  RR: 19  Pulse oxymetry: 91%  Fi02: 15 LPM per HFNC  COVID-19 Labs  Recent Labs    02/24/19 0244 02/25/19 0600 02/26/19 0400  DDIMER 6.30* 3.84* 3.32*  FERRITIN 588* 464*  --   CRP <0.8 <0.8  --     Lab Results  Component Value Date   SARSCOV2NAA POSITIVE (A) 02/13/2019   [place] Not Detected 01/31/2019    D dimer slowly trending down.   Will continue a slow taper of oral prednisone, 20 mg per day. Continue  proneposition, airway clearing techniques with incentive spirometer and flutter valve, antitussiveagents,bronchodilator therapy.  Tolerating well IV ceftriaxone forantibiotic therapy #8/8, last dose today.   Continue with alprazolam for anxiety (last dose 11/23)  She continues at high risk for respiratory failure.  2. T2DM with steroid induced hyperglycemia. Fasting glucose at 104 today. On 15 units of basal insulin and sliding scale insulin, for glucose cover and monitoring.   3. HTN.Continue with enalapril for blood pressure control.   4. Dyslipidemia.Continue withatorvastatin.  5. Obesity. BMIcalculated is 30,4.   DVT prophylaxis:enoxaparin Code Status:full Family Communication:no family at the bedside Disposition Plan/ discharge barriers:pending clinical improvement   Body mass index is 30.48 kg/m. Malnutrition Type:      Malnutrition Characteristics:      Nutrition  Interventions:     RN Pressure Injury Documentation:  Consultants:     Procedures:     Antimicrobials:   Ceftriaxone #8/8 last dose 11/26.     Subjective: Patient this am only on HFNC with good toleration, she is out of bed to chair. Continue to have significant dyspnea, but noticed improvement, no nausea or vomiting. Improved appetite. No bowel movement in a few days.   Objective: Vitals:   02/26/19 0006 02/26/19 0426 02/26/19 0427 02/26/19 0758  BP:  (!) 141/73  91/67  Pulse:  69  72  Resp:  17  19  Temp: 98.9 F (37.2 C)  98.1 F (36.7 C) 98 F (36.7 C)  TempSrc: Axillary  Axillary Axillary  SpO2:  93%  98%  Weight:      Height:        Intake/Output Summary (Last 24 hours) at 02/26/2019 0813 Last data filed at 02/25/2019 1200 Gross per 24 hour  Intake 340 ml  Output -  Net 340 ml   Filed Weights   02/15/19 2300  Weight: 75.6 kg    Examination:   General: deconditioned and dyspneic.  Neurology: Awake and alert, non focal  E ENT: mild pallor, no icterus, oral mucosa moist Cardiovascular: No JVD. S1-S2 present, rhythmic, no gallops, rubs, or murmurs. No lower extremity edema. Pulmonary: positive breath sounds bilaterally. Gastrointestinal. Abdomen with no organomegaly, non tender, no rebound or guarding Skin. No rashes Musculoskeletal: no joint deformities     Data Reviewed: I have personally reviewed following labs and imaging studies  CBC: Recent Labs  Lab 02/19/19 1615 02/20/19 0155 02/22/19 0252 02/24/19 1412  WBC 12.5* 13.1* 16.3*  --   NEUTROABS 9.2* 9.1* 13.0*  --   HGB 14.7 14.7 15.0 15.0  HCT 45.2 46.6* 47.0* 44.0  MCV 92.1 95.3 92.9  --   PLT 231 251 269  --    Basic Metabolic Panel: Recent Labs  Lab 02/22/19 0252 02/23/19 0120 02/24/19 0244 02/24/19 1412 02/25/19 0600 02/26/19 0400  NA 144 144 142 143 144 138  K 4.1 4.1 4.5 3.7 4.4 4.1  CL 107 105 104  --  108 106  CO2 26 26 27   --  24 22  GLUCOSE 214* 203*  191*  --  128* 104*  BUN 35* 29* 29*  --  27* 26*  CREATININE 0.85 0.78 0.85  --  0.69 0.64  CALCIUM 9.0 8.8* 8.9  --  8.8* 8.5*   GFR: Estimated Creatinine Clearance: 61.4 mL/min (by C-G formula based on SCr of 0.64 mg/dL). Liver Function Tests: Recent Labs  Lab 02/19/19 1615 02/24/19 0244 02/25/19 0600 02/26/19 0400  AST 92* 47* 40 41  ALT 52* 50* 40 38  ALKPHOS 51 98 103 119  BILITOT 0.6 1.1 0.8 1.0  PROT 6.4* 6.1* 5.7* 5.8*  ALBUMIN 3.4* 3.4* 3.0* 3.2*   No results for input(s): LIPASE, AMYLASE in the last 168 hours. No results for input(s): AMMONIA in the last 168 hours. Coagulation Profile: No results for input(s): INR, PROTIME in the last 168 hours. Cardiac Enzymes: No results for input(s): CKTOTAL, CKMB, CKMBINDEX, TROPONINI in the last 168 hours. BNP (last 3 results) No results for input(s): PROBNP in the last 8760 hours. HbA1C: No results for input(s): HGBA1C in the last 72 hours. CBG: Recent Labs  Lab 02/24/19 2005 02/25/19 0703 02/25/19 1137 02/25/19 1614 02/25/19 2008  GLUCAP 189* 137* 171* 153* 105*   Lipid Profile: No results for input(s): CHOL, HDL, LDLCALC, TRIG, CHOLHDL, LDLDIRECT in the last 72 hours. Thyroid Function Tests:  No results for input(s): TSH, T4TOTAL, FREET4, T3FREE, THYROIDAB in the last 72 hours. Anemia Panel: Recent Labs    02/24/19 0244 02/25/19 0600  FERRITIN 588* 464*      Radiology Studies: I have reviewed all of the imaging during this hospital visit personally     Scheduled Meds: . apraclonidine  1 drop Right Eye BID  . aspirin  81 mg Oral BID  . atorvastatin  40 mg Oral QPM  . chlorpheniramine-HYDROcodone  5 mL Oral Q12H  . enalapril  2.5 mg Oral Daily  . enoxaparin (LOVENOX) injection  40 mg Subcutaneous Q12H  . famotidine  20 mg Oral Daily  . insulin aspart  0-15 Units Subcutaneous TID WC  . insulin glargine  15 Units Subcutaneous Daily  . Ipratropium-Albuterol  1 puff Inhalation Q6H  . latanoprost  1  drop Right Eye QHS  . mouth rinse  15 mL Mouth Rinse BID  . mirabegron ER  50 mg Oral Daily  . predniSONE  20 mg Oral Q breakfast  . sodium chloride flush  3 mL Intravenous Q12H  . sodium chloride flush  3 mL Intravenous Q12H  . timolol  1 drop Right Eye BID   Continuous Infusions: . sodium chloride    . cefTRIAXone (ROCEPHIN)  IV 1 g (02/25/19 0837)     LOS: 11 days         Gerome Apley, MD

## 2019-02-26 NOTE — Progress Notes (Signed)
Pt on 15L HFNC down from 15L HFNC & 100% nonrebreather. Transferred pt from bed to chair, desat to 60's%, applied 100% nonrebreather along with 15L HFNC and pt recovered after about 5 min. Nonrebreather was then removed and pt tolerated well.

## 2019-02-27 LAB — GLUCOSE, CAPILLARY
Glucose-Capillary: 107 mg/dL — ABNORMAL HIGH (ref 70–99)
Glucose-Capillary: 200 mg/dL — ABNORMAL HIGH (ref 70–99)
Glucose-Capillary: 242 mg/dL — ABNORMAL HIGH (ref 70–99)
Glucose-Capillary: 138 mg/dL — ABNORMAL HIGH (ref 70–99)

## 2019-02-27 LAB — COMPREHENSIVE METABOLIC PANEL
ALT: 39 U/L (ref 0–44)
AST: 42 U/L — ABNORMAL HIGH (ref 15–41)
Albumin: 3.1 g/dL — ABNORMAL LOW (ref 3.5–5.0)
Alkaline Phosphatase: 114 U/L (ref 38–126)
Anion gap: 10 (ref 5–15)
BUN: 25 mg/dL — ABNORMAL HIGH (ref 8–23)
CO2: 24 mmol/L (ref 22–32)
Calcium: 8.6 mg/dL — ABNORMAL LOW (ref 8.9–10.3)
Chloride: 106 mmol/L (ref 98–111)
Creatinine, Ser: 0.6 mg/dL (ref 0.44–1.00)
GFR calc Af Amer: >60 mL/min (ref >60)
GFR calc non Af Amer: >60 mL/min (ref >60)
Glucose, Bld: 83 mg/dL (ref 70–99)
Potassium: 3.9 mmol/L (ref 3.5–5.1)
Sodium: 140 mmol/L (ref 135–145)
Total Bilirubin: 1.1 mg/dL (ref 0.3–1.2)
Total Protein: 5.5 g/dL — ABNORMAL LOW (ref 6.5–8.1)

## 2019-02-27 LAB — C-REACTIVE PROTEIN: CRP: <0.8 mg/dL (ref <1.0)

## 2019-02-27 LAB — D-DIMER, QUANTITATIVE: D-Dimer, Quant: 3.07 ug/mL-FEU — ABNORMAL HIGH (ref 0.00–0.50)

## 2019-02-27 LAB — FERRITIN: Ferritin: 457 ng/mL — ABNORMAL HIGH (ref 11–307)

## 2019-02-27 MED ORDER — INSULIN GLARGINE 100 UNIT/ML ~~LOC~~ SOLN
10.0000 [IU] | Freq: Every day | SUBCUTANEOUS | Status: DC
  Administered 2019-02-28 – 2019-03-04 (×5): 10 [IU] via SUBCUTANEOUS
  Filled 2019-02-27 (×6): qty 0.1

## 2019-02-27 NOTE — Progress Notes (Signed)
PROGRESS NOTE    Tyranny Helmke  [id number] DOB: Apr 24, 1945 DOA: 02/15/2019 PCP: Donald Prose, MD    Brief Narrative:  73 year old female who presented with dyspnea. She does have significant past medical history for hypertension, type 2 diabetes mellitus, GERD, andobstructive sleep apnea. Patient reported 7 days of dyspnea, evaluated in the emergency department November 10,and then November 13, on her second ED visit she was diagnosed with COVID-19 and discharged to assisted living facility. Unfortunately her symptoms continue to deteriorate, he developed hypoxemia and persistent fever. On her initial physical examination her oximetry was 86% on room air,blood pressure 152/70, pulse rate 86, respirate 26, temperature 98.4,she had dry mucous membranes, her lungs were clear to auscultation bilaterally, heart S1-S2 present rhythmic, abdomen soft, no lower extremity edema. Sodium 139, potassium 4.3, chloride 106, bicarb 24, glucose 157, BUN 26, creatinine 0.98, white count 4.6, hemoglobin 13.5, hematocrit 42.8, platelets 132.SARS COVID-19 was positive. Chest radiograph with bilateral interstitial infiltrates, at bases and right upper lobe. EKG 82 bpm, normal axis, sinus rhythm, no ST segment T wave changes.  Patient was admitted to the hospitalwith theworking diagnosis of acute hypoxic respiratory failuredue toSARS COVID-19 viral pneumonia.  Patient was admitted to the progressive care unit, she had persistent high oxygen requirements, high flow nasal cannula, 13 L/min. Received treatment with remdesivir and systemic steroids. November 16 had convalescent plasma andNovember 17, Actemra.  Patient continue to have a high oxygen requirements, follow chest film with dense infiltrate on the left upper lobe with positive air bronchogram. Added antibiotic therapy with ceftriaxone for suspected bacterial over-infection.  Patient with persistent high oxygen requirements.CT  chest was negative for PE, noted significant bilateral ground glass opacities, consistent with severelung injury due to [place] 19.  Patient slowly improving but continue to have persistent high oxygen requirements.    Assessment & Plan:   Active Problems:   Pneumonia due to COVID-19 virus   Acute respiratory failure due to COVID-19 ([place])   DM2 (diabetes mellitus, type 2) (HCC)   Essential hypertension   Hyperlipemia   OSA on CPAP   1.Acute hypoxic respiratory failure due to SARS COVID-19 viral pneumoniawith bacterial coinfection. Sp convalescent plasma and actemra11/17/20.Conmpleted 5 doses of Remdesivir.Completed 8 days of antibiotic therapy with Ceftriaxone.   RR: 18  Pulse oxymetry: 90  Fi02: 15 LPM per HFNC   COVID-19 Labs  Recent Labs    02/25/19 0600 02/26/19 0400 02/27/19 0220  DDIMER 3.84* 3.32* 3.07*  FERRITIN 464* 399* 457*  CRP <0.8 <0.8 <0.8    Lab Results  Component Value Date   SARSCOV2NAA POSITIVE (A) 02/13/2019   [place] Not Detected 01/31/2019     Stable inflammatory markers today. Patient has been able to maintain oxymetry above 88%, juts with HFNC and has not required NRBM over last 24 H.   On a slow oral prednisone rtaper, 20 mg per day. Continue to encourage proneposition, use of airway clearing techniques with incentive spirometer and flutter valve, Continue withantitussiveagents and bronchodilator therapy.  Patient continue to be at high risk of worsening respiratory failure.   2. T2DM with steroid induced hyperglycemia.Glucose has been better with reduction on systemic steroids. Fasting glucose is 83 this am, will decrease basal insulin to 10 units and will continue with sliding scale insulin, for glucose cover and monitoring.  3. HTN.Blood pressure control withenalapril.  4. Dyslipidemia.Onatorvastatin.  5. Obesity. BMIis 30,4.   DVT prophylaxis:enoxaparin Code Status:full Family  Communication:no family at the bedside Disposition Plan/ discharge barriers:pending clinical improvement  Body mass index is 30.48 kg/m. Malnutrition Type:      Malnutrition Characteristics:      Nutrition Interventions:     RN Pressure Injury Documentation:     Consultants:     Procedures:     Antimicrobials:       Subjective: Patient is feeling better, continue to have dyspnea at rest and on exertion, but noted mild improvement. No nausea or vomiting, tolerating po well. No chest pain. This am is out of bed to chair. Prone positioning at night as tolerated.   Objective: Vitals:   02/26/19 2005 02/27/19 0017 02/27/19 0452 02/27/19 0756  BP:  118/60 (!) 157/76 (!) 141/63  Pulse:  79 75 83  Resp:  (!) 21 19 18   Temp: 97.8 F (36.6 C) (!) 97.5 F (36.4 C) 98.6 F (37 C) 98.6 F (37 C)  TempSrc: Axillary Oral Axillary Oral  SpO2:  (!) 88% 93% 90%  Weight:      Height:        Intake/Output Summary (Last 24 hours) at 02/27/2019 [id number] Last data filed at 02/26/2019 1300 Gross per 24 hour  Intake 580 ml  Output -  Net 580 ml   Filed Weights   02/15/19 2300  Weight: 75.6 kg    Examination:   General: Not in pain, positive dyspnea and deconditioning Neurology: Awake and alert, non focal  E ENT: mild pallor, no icterus, oral mucosa moist Cardiovascular: No JVD. S1-S2 present, rhythmic, no gallops, rubs, or murmurs. No lower extremity edema. Pulmonary: positive breath sounds bilaterally.  Gastrointestinal. Abdomen flat, no organomegaly, non tender, no rebound or guarding Skin. No rashes Musculoskeletal: no joint deformities     Data Reviewed: I have personally reviewed following labs and imaging studies  CBC: Recent Labs  Lab 02/22/19 0252 02/24/19 1412  WBC 16.3*  --   NEUTROABS 13.0*  --   HGB 15.0 15.0  HCT 47.0* 44.0  MCV 92.9  --   PLT 269  --    Basic Metabolic Panel: Recent Labs  Lab 02/23/19 0120 02/24/19 0244  02/24/19 1412 02/25/19 0600 02/26/19 0400 02/27/19 0220  NA 144 142 143 144 138 140  K 4.1 4.5 3.7 4.4 4.1 3.9  CL 105 104  --  108 106 106  CO2 26 27  --  24 22 24   GLUCOSE 203* 191*  --  128* 104* 83  BUN 29* 29*  --  27* 26* 25*  CREATININE 0.78 0.85  --  0.69 0.64 0.60  CALCIUM 8.8* 8.9  --  8.8* 8.5* 8.6*   GFR: Estimated Creatinine Clearance: 61.4 mL/min (by C-G formula based on SCr of 0.6 mg/dL). Liver Function Tests: Recent Labs  Lab 02/24/19 0244 02/25/19 0600 02/26/19 0400 02/27/19 0220  AST 47* 40 41 42*  ALT 50* 40 38 39  ALKPHOS 98 103 119 114  BILITOT 1.1 0.8 1.0 1.1  PROT 6.1* 5.7* 5.8* 5.5*  ALBUMIN 3.4* 3.0* 3.2* 3.1*   No results for input(s): LIPASE, AMYLASE in the last 168 hours. No results for input(s): AMMONIA in the last 168 hours. Coagulation Profile: No results for input(s): INR, PROTIME in the last 168 hours. Cardiac Enzymes: No results for input(s): CKTOTAL, CKMB, CKMBINDEX, TROPONINI in the last 168 hours. BNP (last 3 results) No results for input(s): PROBNP in the last 8760 hours. HbA1C: No results for input(s): HGBA1C in the last 72 hours. CBG: Recent Labs  Lab 02/25/19 2008 02/26/19 0825 02/26/19 1148 02/26/19 1603 02/26/19 2116  GLUCAP 105* 104* 221* 214* 111*   Lipid Profile: No results for input(s): CHOL, HDL, LDLCALC, TRIG, CHOLHDL, LDLDIRECT in the last 72 hours. Thyroid Function Tests: No results for input(s): TSH, T4TOTAL, FREET4, T3FREE, THYROIDAB in the last 72 hours. Anemia Panel: Recent Labs    02/26/19 0400 02/27/19 0220  FERRITIN 399* 457*      Radiology Studies: I have reviewed all of the imaging during this hospital visit personally     Scheduled Meds: . apraclonidine  1 drop Right Eye BID  . aspirin  81 mg Oral BID  . atorvastatin  40 mg Oral QPM  . chlorpheniramine-HYDROcodone  5 mL Oral Q12H  . enalapril  2.5 mg Oral Daily  . enoxaparin (LOVENOX) injection  40 mg Subcutaneous Q12H  .  famotidine  20 mg Oral Daily  . insulin aspart  0-15 Units Subcutaneous TID WC  . insulin glargine  15 Units Subcutaneous Daily  . Ipratropium-Albuterol  1 puff Inhalation Q6H  . latanoprost  1 drop Right Eye QHS  . mouth rinse  15 mL Mouth Rinse BID  . mirabegron ER  50 mg Oral Daily  . polyethylene glycol  17 g Oral BID  . predniSONE  20 mg Oral Q breakfast  . sodium chloride flush  3 mL Intravenous Q12H  . sodium chloride flush  3 mL Intravenous Q12H  . timolol  1 drop Right Eye BID   Continuous Infusions: . sodium chloride       LOS: 12 days        Mauricio Gerome Apley, MD

## 2019-02-27 NOTE — Plan of Care (Signed)

## 2019-02-28 LAB — GLUCOSE, CAPILLARY
Glucose-Capillary: 122 mg/dL — ABNORMAL HIGH (ref 70–99)
Glucose-Capillary: 192 mg/dL — ABNORMAL HIGH (ref 70–99)
Glucose-Capillary: 128 mg/dL — ABNORMAL HIGH (ref 70–99)

## 2019-02-28 MED ORDER — BISACODYL 5 MG PO TBEC
10.0000 mg | DELAYED_RELEASE_TABLET | Freq: Every day | ORAL | Status: DC | PRN
  Administered 2019-02-28: 10 mg via ORAL
  Filled 2019-02-28: qty 2

## 2019-02-28 NOTE — Progress Notes (Signed)
Updated family friend Seth Bake. All questions answered.

## 2019-02-28 NOTE — Plan of Care (Signed)
Stable during shift. Continued covid tx education including pulmonary toilet and proning.   Remains on HFNC 15 lpm, dyspnea with exertion and conversation. Assisting in ADLs. OOB to recliner/BSC with minimal assistance and FWW. Afebrile. CBG well controlled.   Slept in prone position twice (2.5 hr and 1.5 hr) for total of 4 hr. Sats 100% while proned. Did have dyspnea and decreased sats after returning supine from prone position. Briefly required HFNC 15 lpm PLUS NRB 15 lpm to recover but able to wean back to just HFNC 15 lpm.   Still no BM, denies abdominal discomfort.    Problem: Education: Goal: Knowledge of General Education information will improve Description: Including pain rating scale, medication(s)/side effects and non-pharmacologic comfort measures Outcome: Progressing   Problem: Health Behavior/Discharge Planning: Goal: Ability to manage health-related needs will improve Outcome: Progressing   Problem: Clinical Measurements: Goal: Ability to maintain clinical measurements within normal limits will improve Outcome: Progressing Goal: Will remain free from infection Outcome: Progressing Goal: Diagnostic test results will improve Outcome: Progressing Goal: Respiratory complications will improve Outcome: Progressing Goal: Cardiovascular complication will be avoided Outcome: Progressing   Problem: Activity: Goal: Risk for activity intolerance will decrease Outcome: Progressing   Problem: Nutrition: Goal: Adequate nutrition will be maintained Outcome: Progressing   Problem: Coping: Goal: Level of anxiety will decrease Outcome: Progressing   Problem: Elimination: Goal: Will not experience complications related to urinary retention Outcome: Progressing   Problem: Pain Managment: Goal: General experience of comfort will improve Outcome: Progressing   Problem: Safety: Goal: Ability to remain free from injury will improve Outcome: Progressing   Problem: Skin  Integrity: Goal: Risk for impaired skin integrity will decrease Outcome: Progressing   Problem: Education: Goal: Knowledge of risk factors and measures for prevention of condition will improve Outcome: Progressing   Problem: Coping: Goal: Psychosocial and spiritual needs will be supported Outcome: Progressing   Problem: Respiratory: Goal: Will maintain a patent airway Outcome: Progressing Goal: Complications related to the disease process, condition or treatment will be avoided or minimized Outcome: Progressing

## 2019-02-28 NOTE — Plan of Care (Deleted)
   Problem: Health Behavior/Discharge Planning: Goal: Ability to manage health-related needs will improve 02/28/2019 0547 by Clerance Lav, RN Outcome: Progressing 02/28/2019 0527 by Clerance Lav, RN Outcome: Progressing   Problem: Clinical Measurements: Goal: Ability to maintain clinical measurements within normal limits will improve 02/28/2019 0547 by Clerance Lav, RN Outcome: Progressing 02/28/2019 0527 by Clerance Lav, RN Outcome: Progressing Goal: Will remain free from infection 02/28/2019 0547 by Clerance Lav, RN Outcome: Progressing 02/28/2019 0527 by Clerance Lav, RN Outcome: Progressing Goal: Diagnostic test results will improve 02/28/2019 0547 by Clerance Lav, RN Outcome: Progressing 02/28/2019 0527 by Clerance Lav, RN Outcome: Progressing Goal: Respiratory complications will improve 02/28/2019 0547 by Clerance Lav, RN Outcome: Progressing 02/28/2019 0527 by Clerance Lav, RN Outcome: Progressing Goal: Cardiovascular complication will be avoided 02/28/2019 0547 by Clerance Lav, RN Outcome: Progressing 02/28/2019 0527 by Clerance Lav, RN Outcome: Progressing   Problem: Activity: Goal: Risk for activity intolerance will decrease 02/28/2019 0547 by Clerance Lav, RN Outcome: Progressing 02/28/2019 0527 by Clerance Lav, RN Outcome: Progressing   Problem: Nutrition: Goal: Adequate nutrition will be maintained 02/28/2019 0547 by Clerance Lav, RN Outcome: Progressing 02/28/2019 0527 by Clerance Lav, RN Outcome: Progressing   Problem: Coping: Goal: Level of anxiety will decrease 02/28/2019 0547 by Clerance Lav, RN Outcome: Progressing 02/28/2019 0527 by Clerance Lav, RN Outcome: Progressing   Problem: Elimination: Goal: Will not experience complications related to urinary retention 02/28/2019 0547 by Clerance Lav, RN Outcome: Progressing 02/28/2019 0527 by Clerance Lav,  RN Outcome: Progressing   Problem: Pain Managment: Goal: General experience of comfort will improve 02/28/2019 0547 by Clerance Lav, RN Outcome: Progressing 02/28/2019 0527 by Clerance Lav, RN Outcome: Progressing   Problem: Safety: Goal: Ability to remain free from injury will improve 02/28/2019 0547 by Clerance Lav, RN Outcome: Progressing 02/28/2019 0527 by Clerance Lav, RN Outcome: Progressing   Problem: Skin Integrity: Goal: Risk for impaired skin integrity will decrease 02/28/2019 0547 by Clerance Lav, RN Outcome: Progressing 02/28/2019 0527 by Clerance Lav, RN Outcome: Progressing   Problem: Education: Goal: Knowledge of risk factors and measures for prevention of condition will improve 02/28/2019 0547 by Clerance Lav, RN Outcome: Progressing 02/28/2019 0527 by Clerance Lav, RN Outcome: Progressing   Problem: Coping: Goal: Psychosocial and spiritual needs will be supported 02/28/2019 0547 by Clerance Lav, RN Outcome: Progressing 02/28/2019 0527 by Clerance Lav, RN Outcome: Progressing   Problem: Respiratory: Goal: Will maintain a patent airway 02/28/2019 0547 by Clerance Lav, RN Outcome: Progressing 02/28/2019 0527 by Clerance Lav, RN Outcome: Progressing Goal: Complications related to the disease process, condition or treatment will be avoided or minimized 02/28/2019 0547 by Clerance Lav, RN Outcome: Progressing 02/28/2019 0527 by Clerance Lav, RN Outcome: Progressing

## 2019-02-28 NOTE — Progress Notes (Addendum)
PROGRESS NOTE    Lindsey Baldwin  [id number] DOB: July 04, 1945 DOA: 02/15/2019 PCP: Donald Prose, MD    Brief Narrative:  73 year old female who presented with dyspnea. She does have significant past medical history for hypertension, type 2 diabetes mellitus, GERD, andobstructive sleep apnea. Patient reported 7 days of dyspnea, evaluated in the emergency department November 10,and then November 13, on her second ED visit she was diagnosed with COVID-19 and discharged to assisted living facility. Unfortunately her symptoms continue to deteriorate, he developed hypoxemia and persistent fever. On her initial physical examination her oximetry was 86% on room air,blood pressure 152/70, pulse rate 86, respirate 26, temperature 98.4,she had dry mucous membranes, her lungs were clear to auscultation bilaterally, heart S1-S2 present rhythmic, abdomen soft, no lower extremity edema. Sodium 139, potassium 4.3, chloride 106, bicarb 24, glucose 157, BUN 26, creatinine 0.98, white count 4.6, hemoglobin 13.5, hematocrit 42.8, platelets 132.SARS COVID-19 was positive. Chest radiograph with bilateral interstitial infiltrates, at bases and right upper lobe. EKG 82 bpm, normal axis, sinus rhythm, no ST segment T wave changes.  Patient was admitted to the hospitalwith theworking diagnosis of acute hypoxic respiratory failuredue toSARS COVID-19 viral pneumonia.  Patient was admitted to the progressive care unit, she had persistent high oxygen requirements, high flow nasal cannula 15 L/min plus non rebreather mask 15 LPM. Received treatment with remdesivir and systemic steroids. November 16 had convalescent plasma andNovember 17, Actemra.  Patient continue to have a high oxygen requirements, follow chest film with dense infiltrate on the left upper lobe with positive air bronchogram. Added antibiotic therapy with ceftriaxone for suspected bacterial co-infection.  Patient with persistent high  oxygen requirements.CT chest was negative for PE, noted significant bilateral ground glass opacities, consistent with severelung injury due to [place] 19.  Patient slowly improving but continue to have persistent high oxygen requirements.     Assessment & Plan:   Active Problems:   Pneumonia due to COVID-19 virus   Acute respiratory failure due to COVID-19 ([place])   DM2 (diabetes mellitus, type 2) ([facility])   Essential hypertension   Hyperlipemia   OSA on CPAP    1.Acute hypoxic respiratory failure due to SARS COVID-19 viral pneumonia (POA) complicatedwith bacterial pneumonia coinfection (no POA). Sp convalescent plasma and actemra11/17/20.Conmpleted 5 doses of Remdesivir.Completed 8 days of antibiotic therapy with Ceftriaxone.   RR: 17 to 22  Pulse oxymetry: 97%  Fi02: 15 LPM per HFNC  COVID-19 Labs  Recent Labs    02/26/19 0400 02/27/19 0220  DDIMER 3.32* 3.07*  FERRITIN 399* 457*  CRP <0.8 <0.8    Lab Results  Component Value Date   SARSCOV2NAA POSITIVE (A) 02/13/2019   [place] Not Detected 01/31/2019    No inflammatory markers check today. She continue to use HFNC, but less time NRB mask. Continue to prone at night, reaching a oxygen saturation of 100% while prone.    Continue with aslow oral prednisone raper,20 mg/ day Continue to encourageproneposition, use of airway clearing techniques with incentive spirometer and flutter valve. On antitussiveagents and bronchodilator therapy.  BID enoxaparin due to high risk DVT protocol, D dimer reached >5 during this hospitalization.   Continue to be at high risk of worsening respiratory failure.   2. T2DM, sp steroid induced hyperglycemia. Capillary glucose 122 and 192 , this am, will continue insulin sliding scale for glucose cover and monitoring, plus 10 units of basal insulin Patient is tolerating po with improved appetite.   3. HTN.Continue withenalapril for blood pressure control.  4.  Dyslipidemia.Continue with atorvastatin.  5. Obesity. BMIcalculated is 30,4.   DVT prophylaxis:enoxaparin Code Status:full Family Communication:no family at the bedside Disposition Plan/ discharge barriers:pending clinical improvement  Body mass index is 30.48 kg/m. Malnutrition Type:      Malnutrition Characteristics:      Nutrition Interventions:     RN Pressure Injury Documentation:     Consultants:     Procedures:     Antimicrobials:       Subjective: Patient is feeling better, continue to have dyspnea with minimal efforts, no nausea or vomiting. Tolerating prone at night. Using less NRB mask. Improved appetite.   Objective: Vitals:   02/28/19 0518 02/28/19 0519 02/28/19 0526 02/28/19 0600  BP:      Pulse: 69 68 69 66  Resp: (!) 23 (!) 22 17 (!) 22  Temp:      TempSrc:      SpO2: 99% 95% 97% 97%  Weight:      Height:        Intake/Output Summary (Last 24 hours) at 02/28/2019 0818 Last data filed at 02/27/2019 2100 Gross per 24 hour  Intake 720 ml  Output -  Net 720 ml   Filed Weights   02/15/19 2300  Weight: 75.6 kg    Examination:   General: deconditioned  Neurology: Awake and alert, non focal  E ENT: mild pallor, no icterus, oral mucosa moist Cardiovascular: No JVD. S1-S2 present, rhythmic, no gallops, rubs, or murmurs. No lower extremity edema. Pulmonary: positive breath sounds bilaterally. Gastrointestinal. Abdomen with no organomegaly, non tender, no rebound or guarding Skin. No rashes Musculoskeletal: no joint deformities     Data Reviewed: I have personally reviewed following labs and imaging studies  CBC: Recent Labs  Lab 02/22/19 0252 02/24/19 1412  WBC 16.3*  --   NEUTROABS 13.0*  --   HGB 15.0 15.0  HCT 47.0* 44.0  MCV 92.9  --   PLT 269  --    Basic Metabolic Panel: Recent Labs  Lab 02/23/19 0120 02/24/19 0244 02/24/19 1412 02/25/19 0600 02/26/19 0400 02/27/19 0220  NA 144 142  143 144 138 140  K 4.1 4.5 3.7 4.4 4.1 3.9  CL 105 104  --  108 106 106  CO2 26 27  --  24 22 24   GLUCOSE 203* 191*  --  128* 104* 83  BUN 29* 29*  --  27* 26* 25*  CREATININE 0.78 0.85  --  0.69 0.64 0.60  CALCIUM 8.8* 8.9  --  8.8* 8.5* 8.6*   GFR: Estimated Creatinine Clearance: 61.4 mL/min (by C-G formula based on SCr of 0.6 mg/dL). Liver Function Tests: Recent Labs  Lab 02/24/19 0244 02/25/19 0600 02/26/19 0400 02/27/19 0220  AST 47* 40 41 42*  ALT 50* 40 38 39  ALKPHOS 98 103 119 114  BILITOT 1.1 0.8 1.0 1.1  PROT 6.1* 5.7* 5.8* 5.5*  ALBUMIN 3.4* 3.0* 3.2* 3.1*   No results for input(s): LIPASE, AMYLASE in the last 168 hours. No results for input(s): AMMONIA in the last 168 hours. Coagulation Profile: No results for input(s): INR, PROTIME in the last 168 hours. Cardiac Enzymes: No results for input(s): CKTOTAL, CKMB, CKMBINDEX, TROPONINI in the last 168 hours. BNP (last 3 results) No results for input(s): PROBNP in the last 8760 hours. HbA1C: No results for input(s): HGBA1C in the last 72 hours. CBG: Recent Labs  Lab 02/26/19 2116 02/27/19 0754 02/27/19 1153 02/27/19 1621 02/27/19 2108  GLUCAP 111* 107* 200* 242* 138*  Lipid Profile: No results for input(s): CHOL, HDL, LDLCALC, TRIG, CHOLHDL, LDLDIRECT in the last 72 hours. Thyroid Function Tests: No results for input(s): TSH, T4TOTAL, FREET4, T3FREE, THYROIDAB in the last 72 hours. Anemia Panel: Recent Labs    02/26/19 0400 02/27/19 0220  FERRITIN 399* 457*      Radiology Studies: I have reviewed all of the imaging during this hospital visit personally     Scheduled Meds: . apraclonidine  1 drop Right Eye BID  . aspirin  81 mg Oral BID  . atorvastatin  40 mg Oral QPM  . chlorpheniramine-HYDROcodone  5 mL Oral Q12H  . enalapril  2.5 mg Oral Daily  . enoxaparin (LOVENOX) injection  40 mg Subcutaneous Q12H  . famotidine  20 mg Oral Daily  . insulin aspart  0-15 Units Subcutaneous TID WC   . insulin glargine  10 Units Subcutaneous Daily  . Ipratropium-Albuterol  1 puff Inhalation Q6H  . latanoprost  1 drop Right Eye QHS  . mouth rinse  15 mL Mouth Rinse BID  . mirabegron ER  50 mg Oral Daily  . polyethylene glycol  17 g Oral BID  . predniSONE  20 mg Oral Q breakfast  . sodium chloride flush  3 mL Intravenous Q12H  . sodium chloride flush  3 mL Intravenous Q12H  . timolol  1 drop Right Eye BID   Continuous Infusions: . sodium chloride       LOS: 13 days         Gerome Apley, MD

## 2019-02-28 NOTE — Progress Notes (Signed)
Physical Therapy Treatment Patient Details Name: Lindsey Baldwin MRN: [id number] DOB: 1945/11/05 Today's Date: 02/28/2019    History of Present Illness 73 y/o female w/ hx of DM II, osteoporosis, OSA on CPAP, OAB. OA. HTN, galucoma, GERD, bronchitis, L blind eye, R knee arthroplasty. Pt tested + for COVID 11/13. evaluated in the emergency department November 10, and then November 13, on her second ED visit she was diagnosed with COVID-19 and discharged to assisted living facility. her sx continue to deteriorate, she developed hypoxemia and persistent fever hence returned to hospital.    PT Comments    Tolerated tx well today was able to ambulate approx 2ft in room with HHA and min a, ambulated on NRB at 15L and was able to maintain sats in high 80s, min 02 reading during and after ambulation 86%. Did need slightly increased assistance with sit<>stand but is still progressing towards goals.     Follow Up Recommendations  CIR;SNF     Equipment Recommendations  None recommended by PT    Recommendations for Other Services       Precautions / Restrictions Precautions Precautions: Fall Precaution Comments: monitor sats closely Restrictions Weight Bearing Restrictions: No    Mobility  Bed Mobility Overal bed mobility: Modified Independent                Transfers Overall transfer level: Needs assistance Equipment used: 1 person hand held assist Transfers: Sit to/from Stand Sit to Stand: Min assist;Mod assist         General transfer comment: attempted x 2 toget up with no sucess needs mod a to stand today w/ HHA  Ambulation/Gait Ambulation/Gait assistance: Min assist Gait Distance (Feet): 40 Feet Assistive device: 1 person hand held assist Gait Pattern/deviations: Step-through pattern     General Gait Details: slow ambulation, ambulated on NRB only at 15L and able to [employer] n high 80s min reading 86%   Stairs             Wheelchair  Mobility    Modified Rankin (Stroke Patients Only)       Balance Overall balance assessment: Mild deficits observed, not formally tested                                          Cognition Arousal/Alertness: Awake/alert Behavior During Therapy: WFL for tasks assessed/performed Overall Cognitive Status: Within Functional Limits for tasks assessed                                        Exercises      General Comments        Pertinent Vitals/Pain Pain Assessment: No/denies pain    Home Living                      Prior Function            PT Goals (current goals can now be found in the care plan section) Acute Rehab PT Goals Time For Goal Achievement: 03/07/19 Potential to Achieve Goals: Fair Progress towards PT goals: Progressing toward goals    Frequency    Min 2X/week      PT Plan Current plan remains appropriate;Frequency needs to be updated    Co-evaluation  AM-PAC PT "6 Clicks" Mobility   Outcome Measure  Help needed turning from your back to your side while in a flat bed without using bedrails?: A Little Help needed moving from lying on your back to sitting on the side of a flat bed without using bedrails?: A Little Help needed moving to and from a bed to a chair (including a wheelchair)?: A Little Help needed standing up from a chair using your arms (e.g., wheelchair or bedside chair)?: A Lot Help needed to walk in hospital room?: A Little Help needed climbing 3-5 steps with a railing? : Total 6 Click Score: 15    End of Session Equipment Utilized During Treatment: Oxygen Activity Tolerance: Treatment limited secondary to medical complications (Comment);Patient tolerated treatment well Patient left: in bed;with call bell/phone within reach Nurse Communication: Mobility status;Other (comment) PT Visit Diagnosis: Other abnormalities of gait and mobility (R26.89);Muscle weakness  (generalized) (M62.81)     Time: [id number] PT Time Calculation (min) (ACUTE ONLY): 29 min  Charges:  $Gait Training: 8-22 mins $Therapeutic Activity: 8-22 mins                     Horald Chestnut, PT    Delford Field 02/28/2019, 4:25 PM

## 2019-03-01 LAB — COMPREHENSIVE METABOLIC PANEL
ALT: 42 U/L (ref 0–44)
AST: 34 U/L (ref 15–41)
Albumin: 2.9 g/dL — ABNORMAL LOW (ref 3.5–5.0)
Alkaline Phosphatase: 93 U/L (ref 38–126)
Anion gap: 9 (ref 5–15)
BUN: 21 mg/dL (ref 8–23)
CO2: 27 mmol/L (ref 22–32)
Calcium: 8.6 mg/dL — ABNORMAL LOW (ref 8.9–10.3)
Chloride: 103 mmol/L (ref 98–111)
Creatinine, Ser: 0.67 mg/dL (ref 0.44–1.00)
GFR calc Af Amer: >60 mL/min (ref >60)
GFR calc non Af Amer: >60 mL/min (ref >60)
Glucose, Bld: 81 mg/dL (ref 70–99)
Potassium: 3.8 mmol/L (ref 3.5–5.1)
Sodium: 139 mmol/L (ref 135–145)
Total Bilirubin: 0.9 mg/dL (ref 0.3–1.2)
Total Protein: 5.5 g/dL — ABNORMAL LOW (ref 6.5–8.1)

## 2019-03-01 LAB — CBC WITH DIFFERENTIAL/PLATELET
Abs Immature Granulocytes: 0.27 10*3/uL — ABNORMAL HIGH (ref 0.00–0.07)
Basophils Absolute: 0.1 10*3/uL (ref 0.0–0.1)
Basophils Relative: 0 %
Eosinophils Absolute: 0.3 10*3/uL (ref 0.0–0.5)
Eosinophils Relative: 2 %
HCT: 46.7 % — ABNORMAL HIGH (ref 36.0–46.0)
Hemoglobin: 15.1 g/dL — ABNORMAL HIGH (ref 12.0–15.0)
Immature Granulocytes: 1 %
Lymphocytes Relative: 19 %
Lymphs Abs: 3.6 10*3/uL (ref 0.7–4.0)
MCH: 30 pg (ref 26.0–34.0)
MCHC: 32.3 g/dL (ref 30.0–36.0)
MCV: 92.8 fL (ref 80.0–100.0)
Monocytes Absolute: 1.6 10*3/uL — ABNORMAL HIGH (ref 0.1–1.0)
Monocytes Relative: 9 %
Neutro Abs: 13 10*3/uL — ABNORMAL HIGH (ref 1.7–7.7)
Neutrophils Relative %: 69 %
Platelets: 240 10*3/uL (ref 150–400)
RBC: 5.03 MIL/uL (ref 3.87–5.11)
RDW: 13.2 % (ref 11.5–15.5)
WBC: 18.8 10*3/uL — ABNORMAL HIGH (ref 4.0–10.5)
nRBC: 0 % (ref 0.0–0.2)

## 2019-03-01 LAB — D-DIMER, QUANTITATIVE: D-Dimer, Quant: 2.07 ug/mL-FEU — ABNORMAL HIGH (ref 0.00–0.50)

## 2019-03-01 LAB — GLUCOSE, CAPILLARY
Glucose-Capillary: 128 mg/dL — ABNORMAL HIGH (ref 70–99)
Glucose-Capillary: 187 mg/dL — ABNORMAL HIGH (ref 70–99)
Glucose-Capillary: 227 mg/dL — ABNORMAL HIGH (ref 70–99)
Glucose-Capillary: 170 mg/dL — ABNORMAL HIGH (ref 70–99)
Glucose-Capillary: 161 mg/dL — ABNORMAL HIGH (ref 70–99)

## 2019-03-01 LAB — C-REACTIVE PROTEIN: CRP: <0.8 mg/dL (ref <1.0)

## 2019-03-01 LAB — FERRITIN: Ferritin: 431 ng/mL — ABNORMAL HIGH (ref 11–307)

## 2019-03-01 MED ORDER — PREDNISONE 10 MG PO TABS
10.0000 mg | ORAL_TABLET | Freq: Every day | ORAL | Status: DC
  Administered 2019-03-02 – 2019-03-04 (×3): 10 mg via ORAL
  Filled 2019-03-01 (×3): qty 1

## 2019-03-01 NOTE — Progress Notes (Signed)
PROGRESS NOTE  Lindsey Baldwin  [id number] DOB: 1946/01/10 DOA: 02/15/2019 PCP: Donald Prose, MD   Brief Narrative: Lindsey Baldwin is a 73 y.o. female with a history of T2DM, HTN, OSA, and GERD who initially presented to the ED 11/10 with dyspnea, returning 11/13 and diagnosed with covid-19, discharged back to ALF. With persistent and progressive symptoms she returned 11/15, was found to be hypoxic with CXR findings of bilateral infiltrates, and was admitted to [facility]. She has continued to require 15LPM supplemental oxygen, has been given remdesivir, steroids and doses of convalescent plasma (11/16) and tocilizumab (11/17). Repeat CXR later showed a dense LUL infiltrate w/air bronchogram suggesting bacterial consolidation for which ceftriaxone was given. Subsequent CTA chest showed no PE but confirmed significant bilateral GGOs consistent with covid pneumonia.   Assessment & Plan: Active Problems:   Pneumonia due to COVID-19 virus   Acute respiratory failure due to COVID-19 [facility])   DM2 (diabetes mellitus, type 2) ([place])   Essential hypertension   Hyperlipemia   OSA on CPAP  Acute hypoxic respiratory failure due to covid-19 pneumonia (POA) complicated by superimposed LUL bacterial pneumonia (not POA):  - Completed remdesivir (11/15 - 11/19) - Completed CTX 11/19 - 11/26 - Continue steroids, transitioned to po 11/25, will begin taper since CRP has been undetectable since 11/21 and patient suffered bacterial pneumonia as well.  - s/p CCP and tocilizumab. - Continue to encourage OOB, IS, FV, and proning (which appears to be very helpful for hypoxia in this patient) - Continue lovenox q12h dosing - Isolation period would be 21 days from positive test on 11/13.  T2DM with steroid-induced hyperglycemia: Improved with insulin.  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 Noted.  DVT prophylaxis: Lovenox Code Status: Full Family Communication: Friend per pt request by phone at bedside this AM Disposition Plan: Uncertain, currently would require SNF or CIR.   Consultants:   None  Procedures:   None  Antimicrobials:  Ceftriaxone 11/19 - 11/26  Remdesivir (11/15 - 11/19)  Subjective: Ambulated 40 feet on 15L NRB with SpO2 into 80%'s yesterday. Overall feels dyspnea is decreasing. No chest pain or abd pain. Eating ok.   Objective: Vitals:   03/01/19 0757 03/01/19 0900 03/01/19 0939 03/01/19 1131  BP: 131/74     Pulse: 79 99 87 66  Resp: 20 19 19 19   Temp: 98.1 F (36.7 C)   98 F (36.7 C)  TempSrc: Oral   Oral  SpO2: 98% 99% 97% 99%  Weight:      Height:        Intake/Output Summary (Last 24 hours) at 03/01/2019 1147 Last data filed at 03/01/2019 0954 Gross per 24 hour  Intake 1200 ml  Output -  Net 1200 ml   Filed Weights   02/15/19 2300  Weight: 75.6 kg    Gen: 73 y.o. female in no distress Eyes:  L prosthesis. Right without conjunctival injection Pulm: Non-labored breathing supplemental oxygen. Very minimal crackles bilaterally.  CV: Regular rate and rhythm. No murmur, rub, or gallop. No JVD, no pitting pedal edema. GI: Abdomen soft, non-tender, non-distended, with normoactive bowel sounds. No organomegaly or masses felt. Ext: Warm, no deformities Skin: No rashes, lesions or ulcers on visualized skin Neuro: Alert and oriented. No focal neurological deficits. Psych: Judgement and insight appear normal. Mood & affect  appropriate.   Data Reviewed: I have personally reviewed following labs and imaging studies  CBC: Recent Labs  Lab 02/24/19 1412 03/01/19 0209  WBC  --  18.8*  NEUTROABS  --  13.0*  HGB 15.0 15.1*  HCT 44.0 46.7*  MCV  --  92.8  PLT  --  [id number]   Basic Metabolic Panel: Recent Labs  Lab 02/24/19 0244 02/24/19 1412 02/25/19 0600 02/26/19 0400  02/27/19 0220 03/01/19 0209  NA 142 143 144 138 140 139  K 4.5 3.7 4.4 4.1 3.9 3.8  CL 104  --  108 106 106 103  CO2 27  --  24 22 24 27   GLUCOSE 191*  --  128* 104* 83 81  BUN 29*  --  27* 26* 25* 21  CREATININE 0.85  --  0.69 0.64 0.60 0.67  CALCIUM 8.9  --  8.8* 8.5* 8.6* 8.6*   GFR: Estimated Creatinine Clearance: 61.4 mL/min (by C-G formula based on SCr of 0.67 mg/dL). Liver Function Tests: Recent Labs  Lab 02/24/19 0244 02/25/19 0600 02/26/19 0400 02/27/19 0220 03/01/19 0209  AST 47* 40 41 42* 34  ALT 50* 40 38 39 42  ALKPHOS 98 103 119 114 93  BILITOT 1.1 0.8 1.0 1.1 0.9  PROT 6.1* 5.7* 5.8* 5.5* 5.5*  ALBUMIN 3.4* 3.0* 3.2* 3.1* 2.9*   No results for input(s): LIPASE, AMYLASE in the last 168 hours. No results for input(s): AMMONIA in the last 168 hours. Coagulation Profile: No results for input(s): INR, PROTIME in the last 168 hours. Cardiac Enzymes: No results for input(s): CKTOTAL, CKMB, CKMBINDEX, TROPONINI in the last 168 hours. BNP (last 3 results) No results for input(s): PROBNP in the last 8760 hours. HbA1C: No results for input(s): HGBA1C in the last 72 hours. CBG: Recent Labs  Lab 02/27/19 2108 02/28/19 0856 02/28/19 1152 02/28/19 2144 03/01/19 0755  GLUCAP 138* 122* 192* 128* 187*   Lipid Profile: No results for input(s): CHOL, HDL, LDLCALC, TRIG, CHOLHDL, LDLDIRECT in the last 72 hours. Thyroid Function Tests: No results for input(s): TSH, T4TOTAL, FREET4, T3FREE, THYROIDAB in the last 72 hours. Anemia Panel: Recent Labs    02/27/19 0220 03/01/19 0209  FERRITIN 457* 431*   Urine analysis:    Component Value Date/Time   COLORURINE YELLOW 05/10/2018 0830   APPEARANCEUR CLEAR 05/10/2018 0830   LABSPEC 1.020 05/10/2018 0830   PHURINE 6.0 05/10/2018 0830   GLUCOSEU 250 (A) 05/10/2018 0830   HGBUR MODERATE (A) 05/10/2018 0830   BILIRUBINUR NEGATIVE 05/10/2018 0830   KETONESUR NEGATIVE 05/10/2018 0830   PROTEINUR NEGATIVE 05/10/2018  0830   NITRITE NEGATIVE 05/10/2018 0830   LEUKOCYTESUR NEGATIVE 05/10/2018 0830   No results found for this or any previous visit (from the past 240 hour(s)).    Radiology Studies: No results found.  Scheduled Meds: . apraclonidine  1 drop Right Eye BID  . aspirin  81 mg Oral BID  . atorvastatin  40 mg Oral QPM  . chlorpheniramine-HYDROcodone  5 mL Oral Q12H  . enalapril  2.5 mg Oral Daily  . enoxaparin (LOVENOX) injection  40 mg Subcutaneous Q12H  . famotidine  20 mg Oral Daily  . insulin aspart  0-15 Units Subcutaneous TID WC  . insulin glargine  10 Units Subcutaneous Daily  . Ipratropium-Albuterol  1 puff Inhalation Q6H  . latanoprost  1 drop Right Eye QHS  . mouth rinse  15 mL Mouth Rinse BID  . mirabegron ER  50 mg Oral Daily  .  polyethylene glycol  17 g Oral BID  . predniSONE  20 mg Oral Q breakfast  . sodium chloride flush  3 mL Intravenous Q12H  . sodium chloride flush  3 mL Intravenous Q12H  . timolol  1 drop Right Eye BID   Continuous Infusions: . sodium chloride       LOS: 14 days   Time spent: 25 minutes.  Patrecia Pour, MD [facility] [email or web address] 03/01/2019, 11:47 AM

## 2019-03-02 LAB — GLUCOSE, CAPILLARY
Glucose-Capillary: 246 mg/dL — ABNORMAL HIGH (ref 70–99)
Glucose-Capillary: 141 mg/dL — ABNORMAL HIGH (ref 70–99)
Glucose-Capillary: 178 mg/dL — ABNORMAL HIGH (ref 70–99)
Glucose-Capillary: 156 mg/dL — ABNORMAL HIGH (ref 70–99)

## 2019-03-02 NOTE — Progress Notes (Signed)
Occupational Therapy Treatment Patient Details Name: Lindsey Baldwin MRN: [id number] DOB: 1945/09/10 Today's Date: 03/02/2019    History of present illness 73 y/o female w/ hx of DM II, osteoporosis, OSA on CPAP, OAB. OA. HTN, galucoma, GERD, bronchitis, L blind eye, R knee arthroplasty. Pt tested + for COVID 11/13. evaluated in the emergency department November 10, and then November 13, on her second ED visit she was diagnosed with COVID-19 and discharged to assisted living facility. her sx continue to deteriorate, she developed hypoxemia and persistent fever hence returned to hospital.   OT comments  Pt tolerated treatment well today. Pt able to sit EOB 20+ min while engaging in sponge bathing, grooming/hygiene, and dressing task. Pt's O2 SATs decreased to 84% on 15L HFNC during self-care tasks. Pt unable to bring oxygen back up with 15L NRB donned. O2 SATs increased to 94% after ~1 minute. 2/4 DOE. Pt continues to require cues on technique for breathing exercises. Pt continues to make progress towards goals.   Follow Up Recommendations    SNF   Equipment Recommendations       Recommendations for Other Services      Precautions / Restrictions Precautions Precautions: Fall Restrictions Weight Bearing Restrictions: No       Mobility Bed Mobility Overal bed mobility: Modified Independent Bed Mobility: Supine to Sit     Supine to sit: Modified independent (Device/Increase time);HOB elevated        Transfers Overall transfer level: Needs assistance Equipment used: Rolling walker (2 wheeled) Transfers: Sit to/from [facility] Sit to Stand: Supervision Stand pivot transfers: Supervision            Balance Overall balance assessment: Needs assistance   Sitting balance-Leahy Scale: Good       Standing balance-Leahy Scale: Fair                             ADL either performed or assessed with clinical judgement   ADL Overall ADL's  : Needs assistance/impaired     Grooming: Set up;Sitting   Upper Body Bathing: Set up;Sitting   Lower Body Bathing: Set up;Min guard;Sit to/from stand;Sitting/lateral leans   Upper Body Dressing : Minimal assistance;Sitting   Lower Body Dressing: Supervision/safety;Sitting/lateral leans               Functional mobility during ADLs: Min guard;Rolling walker       Vision       Perception     Praxis      Cognition Arousal/Alertness: Awake/alert Behavior During Therapy: WFL for tasks assessed/performed Overall Cognitive Status: Within Functional Limits for tasks assessed                                          Exercises Exercises: Other exercises Other Exercises Other Exercises: Pursed lip breathing exercises with min cues on technique Other Exercises: Incentive spirometer x 10 with min cues on technique. Averaging 510mL   Shoulder Instructions       General Comments O2 SATs 84% to 94% on 15L O2 via HFNC and 15L via NRB. Continued education with pt on safety strategies, compensatory techniques, and energy conservation with self-care and functional transfer tasks.     Pertinent Vitals/ Pain       Pain Assessment: No/denies pain  Home Living  Prior Functioning/Environment              Frequency  Min 2X/week        Progress Toward Goals  OT Goals(current goals can now be found in the care plan section)  Progress towards OT goals: Progressing toward goals  Acute Rehab OT Goals Patient Stated Goal: To be able to do activities without oxygen ADL Goals Pt Will Perform Grooming: with supervision;standing Pt Will Perform Lower Body Dressing: with supervision;sit to/from stand;sitting/lateral leans Pt Will Transfer to Toilet: with supervision;ambulating Pt Will Perform Toileting - Clothing Manipulation and hygiene: with supervision;sit to/from stand Additional ADL Goal #1: Pt  will recall and demonstrate breathing exercises with 0 verbal cues required.  Plan Discharge plan remains appropriate    Co-evaluation                 AM-PAC OT "6 Clicks" Daily Activity     Outcome Measure   Help from another person eating meals?: None Help from another person taking care of personal grooming?: A Little Help from another person toileting, which includes using toliet, bedpan, or urinal?: A Little Help from another person bathing (including washing, rinsing, drying)?: A Little Help from another person to put on and taking off regular upper body clothing?: A Little Help from another person to put on and taking off regular lower body clothing?: A Little 6 Click Score: 19    End of Session Equipment Utilized During Treatment: Oxygen;Rolling walker  OT Visit Diagnosis: Unsteadiness on feet (R26.81);Muscle weakness (generalized) (M62.81)   Activity Tolerance Patient limited by fatigue(Limited by SOB)   Patient Left in chair;with chair alarm set;with call bell/phone within reach   Nurse Communication Mobility status        Time: 1001-1040 OT Time Calculation (min): 39 min  Charges: OT General Charges $OT Visit: 1 Visit OT Treatments $Self Care/Home Management : 23-37 mins $Therapeutic Activity: 8-22 mins  Mauri Brooklyn OTR/L [phone]    Mauri Brooklyn 03/02/2019, 11:14 AM

## 2019-03-02 NOTE — Progress Notes (Signed)
PROGRESS NOTE  Lindsey Baldwin  [id number] DOB: Feb 13, 1946 DOA: 02/15/2019 PCP: Donald Prose, MD   Brief Narrative: Lindsey Baldwin is a 73 y.o. female with a history of T2DM, HTN, OSA, and GERD who initially presented to the ED 11/10 with dyspnea, returning 11/13 and diagnosed with covid-19, discharged back to ALF. With persistent and progressive symptoms she returned 11/15, was found to be hypoxic with CXR findings of bilateral infiltrates, and was admitted to [facility]. She has continued to require 15LPM supplemental oxygen, has been given remdesivir, steroids and doses of convalescent plasma (11/16) and tocilizumab (11/17). Repeat CXR later showed a dense LUL infiltrate w/air bronchogram suggesting bacterial consolidation for which ceftriaxone was given. Subsequent CTA chest showed no PE but confirmed significant bilateral GGOs consistent with covid pneumonia.   Assessment & Plan: Active Problems:   Pneumonia due to COVID-19 virus   Acute respiratory failure due to COVID-19 [facility])   DM2 (diabetes mellitus, type 2) ([place])   Essential hypertension   Hyperlipemia   OSA on CPAP  Acute hypoxic respiratory failure due to covid-19 pneumonia (POA) complicated by superimposed LUL bacterial pneumonia (not POA):  - Completed remdesivir (11/15 - 11/19) - Completed CTX 11/19 - 11/26 - Continue steroids, transitioned to po 11/25, will begin taper since CRP has been undetectable since 11/21 and patient suffered bacterial pneumonia as well.  - s/p CCP and tocilizumab. - Continue to encourage OOB, IS, FV, and proning (which appears to be very helpful for hypoxia in this patient) - Continue lovenox q12h dosing. D-dimer was up to 9.55 on 11/21, since trending downward. CTA showed no PE. - Isolation period would be 21 days from positive test on 11/13.  T2DM with steroid-induced hyperglycemia: Improved with insulin. Most recent HbA1c was 6.9%.  - Monitor for deescalation as prednisone is tapered. 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 Noted.  DVT prophylaxis: Lovenox Code Status: Full Family Communication: Friend by phone per pt request Disposition Plan: Uncertain, currently would require SNF or CIR, still requires improvement in hypoxia.   Consultants:   None  Procedures:   None  Antimicrobials:  Ceftriaxone (11/19 - 11/26)  Remdesivir (11/15 - 11/19)  Subjective: Continues to desaturate into 80%'s despite 15L HFNC when up/exerting herself but feels the dyspnea is decreasing significantly. Eating ok. No other complaints.    Objective: Vitals:   03/01/19 1636 03/01/19 1958 03/02/19 0356 03/02/19 0746  BP:  126/73 115/62 113/65  Pulse: 82  84 73  Resp: (!) 25  (!) 25 (!) 21  Temp:  98.4 F (36.9 C) (!) 97.5 F (36.4 C) 98.6 F (37 C)  TempSrc:  Oral Oral Axillary  SpO2: 96% 94% (!) 87% 90%  Weight:      Height:        Intake/Output Summary (Last 24 hours) at 03/02/2019 1138 Last data filed at 03/02/2019 0900 Gross per 24 hour  Intake 603 ml  Output -  Net 603 ml   Filed Weights   02/15/19 2300  Weight: 75.6 kg   Gen: 73 y.o. female in no distress Eyes: Left prosthesis intact. Pulm: Nonlabored breathing tachypnea with supplemental oxygen. Crackles, no wheezes, good aeration. CV: Regular rate and rhythm. No murmur, rub, or gallop. No JVD, no dependent edema. GI: Abdomen soft, non-tender, non-distended, with normoactive bowel sounds.  Ext: Warm,  no deformities Skin: No new rashes, lesions or ulcers on visualized skin. Neuro: Alert and oriented. No focal neurological deficits. Psych: Judgement and insight appear fair. Mood euthymic & affect congruent. Behavior is appropriate.    Data Reviewed: I have personally reviewed following labs and imaging studies  CBC: Recent  Labs  Lab 02/24/19 1412 03/01/19 0209  WBC  --  18.8*  NEUTROABS  --  13.0*  HGB 15.0 15.1*  HCT 44.0 46.7*  MCV  --  92.8  PLT  --  [id number]   Basic Metabolic Panel: Recent Labs  Lab 02/24/19 0244 02/24/19 1412 02/25/19 0600 02/26/19 0400 02/27/19 0220 03/01/19 0209  NA 142 143 144 138 140 139  K 4.5 3.7 4.4 4.1 3.9 3.8  CL 104  --  108 106 106 103  CO2 27  --  24 22 24 27   GLUCOSE 191*  --  128* 104* 83 81  BUN 29*  --  27* 26* 25* 21  CREATININE 0.85  --  0.69 0.64 0.60 0.67  CALCIUM 8.9  --  8.8* 8.5* 8.6* 8.6*   GFR: Estimated Creatinine Clearance: 61.4 mL/min (by C-G formula based on SCr of 0.67 mg/dL). Liver Function Tests: Recent Labs  Lab 02/24/19 0244 02/25/19 0600 02/26/19 0400 02/27/19 0220 03/01/19 0209  AST 47* 40 41 42* 34  ALT 50* 40 38 39 42  ALKPHOS 98 103 119 114 93  BILITOT 1.1 0.8 1.0 1.1 0.9  PROT 6.1* 5.7* 5.8* 5.5* 5.5*  ALBUMIN 3.4* 3.0* 3.2* 3.1* 2.9*   No results for input(s): LIPASE, AMYLASE in the last 168 hours. No results for input(s): AMMONIA in the last 168 hours. Coagulation Profile: No results for input(s): INR, PROTIME in the last 168 hours. Cardiac Enzymes: No results for input(s): CKTOTAL, CKMB, CKMBINDEX, TROPONINI in the last 168 hours. BNP (last 3 results) No results for input(s): PROBNP in the last 8760 hours. HbA1C: No results for input(s): HGBA1C in the last 72 hours. CBG: Recent Labs  Lab 03/01/19 0820 03/01/19 1130 03/01/19 1628 03/01/19 2027 03/02/19 0744  GLUCAP 128* 227* 170* 161* 141*   Lipid Profile: No results for input(s): CHOL, HDL, LDLCALC, TRIG, CHOLHDL, LDLDIRECT in the last 72 hours. Thyroid Function Tests: No results for input(s): TSH, T4TOTAL, FREET4, T3FREE, THYROIDAB in the last 72 hours. Anemia Panel: Recent Labs    03/01/19 0209  FERRITIN 431*   Urine analysis:    Component Value Date/Time   COLORURINE YELLOW 05/10/2018 0830   APPEARANCEUR CLEAR 05/10/2018 0830   LABSPEC 1.020  05/10/2018 0830   PHURINE 6.0 05/10/2018 0830   GLUCOSEU 250 (A) 05/10/2018 0830   HGBUR MODERATE (A) 05/10/2018 0830   BILIRUBINUR NEGATIVE 05/10/2018 0830   KETONESUR NEGATIVE 05/10/2018 0830   PROTEINUR NEGATIVE 05/10/2018 0830   NITRITE NEGATIVE 05/10/2018 0830   LEUKOCYTESUR NEGATIVE 05/10/2018 0830   No results found for this or any previous visit (from the past 240 hour(s)).    Radiology Studies: No results found.  Scheduled Meds: . apraclonidine  1 drop Right Eye BID  . aspirin  81 mg Oral BID  . atorvastatin  40 mg Oral QPM  . chlorpheniramine-HYDROcodone  5 mL Oral Q12H  . enalapril  2.5 mg Oral Daily  . enoxaparin (LOVENOX) injection  40 mg Subcutaneous Q12H  . famotidine  20 mg Oral Daily  . insulin aspart  0-15 Units Subcutaneous TID WC  . insulin glargine  10 Units Subcutaneous Daily  . Ipratropium-Albuterol  1 puff Inhalation  Q6H  . latanoprost  1 drop Right Eye QHS  . mouth rinse  15 mL Mouth Rinse BID  . mirabegron ER  50 mg Oral Daily  . polyethylene glycol  17 g Oral BID  . predniSONE  10 mg Oral Q breakfast  . sodium chloride flush  3 mL Intravenous Q12H  . sodium chloride flush  3 mL Intravenous Q12H  . timolol  1 drop Right Eye BID   Continuous Infusions: . sodium chloride       LOS: 15 days   Time spent: 25 minutes.  Patrecia Pour, MD [facility] [email or web address] 03/02/2019, 11:38 AM

## 2019-03-02 NOTE — Care Plan (Addendum)
Patient awakened with dry, hacky coughing spell. Robitussin given. 02 increased to 8L HFNC due to sats down in upper 70's to low 80's. Slowly 02 sats up into mid 80's. Patient lying supine now and refuses to turn at present. Emotional support provided.  Patient cont. In low to mid 80's, 02 up to 10L HFNC. Turned on left side. Patients sats back down into low 70's. 02 at 10L HFNC, lying supine with hacking cough. Encouraged to lie prone but refuses, lying on left side and sats slowly into 80's. RR 21 even and unlabored at present.  Patient cont. With sats low to mid 80's. 02 increased to 12L HFNNC. Will cont. To monitor.   Patient placed on 15L HFNC and 100% NRB due to desating into upper 70's sustained. 02 sats up to 90% with both oxygen flows. Patient alert and oriented and in NAD. RR 24 even and unlabored.

## 2019-03-02 NOTE — Plan of Care (Signed)
  Problem: Education: Goal: Knowledge of General Education information will improve Description: Including pain rating scale, medication(s)/side effects and non-pharmacologic comfort measures Outcome: Progressing   Problem: Health Behavior/Discharge Planning: Goal: Ability to manage health-related needs will improve Outcome: Progressing   Problem: Clinical Measurements: Goal: Ability to maintain clinical measurements within normal limits will improve Outcome: Progressing Goal: Will remain free from infection Outcome: Progressing Goal: Diagnostic test results will improve Outcome: Progressing Goal: Respiratory complications will improve Outcome: Progressing Goal: Cardiovascular complication will be avoided Outcome: Progressing   Problem: Activity: Goal: Risk for activity intolerance will decrease Outcome: Progressing   Problem: Nutrition: Goal: Adequate nutrition will be maintained Outcome: Progressing   Problem: Elimination: Goal: Will not experience complications related to bowel motility Outcome: Progressing Goal: Will not experience complications related to urinary retention Outcome: Progressing   Problem: Pain Managment: Goal: General experience of comfort will improve Outcome: Progressing   Problem: Safety: Goal: Ability to remain free from injury will improve Outcome: Progressing   Problem: Skin Integrity: Goal: Risk for impaired skin integrity will decrease Outcome: Progressing   Problem: Education: Goal: Knowledge of risk factors and measures for prevention of condition will improve Outcome: Progressing   Problem: Coping: Goal: Psychosocial and spiritual needs will be supported Outcome: Progressing   Problem: Respiratory: Goal: Will maintain a patent airway Outcome: Progressing Goal: Complications related to the disease process, condition or treatment will be avoided or minimized Outcome: Progressing

## 2019-03-03 LAB — GLUCOSE, CAPILLARY
Glucose-Capillary: 182 mg/dL — ABNORMAL HIGH (ref 70–99)
Glucose-Capillary: 123 mg/dL — ABNORMAL HIGH (ref 70–99)
Glucose-Capillary: 207 mg/dL — ABNORMAL HIGH (ref 70–99)
Glucose-Capillary: 191 mg/dL — ABNORMAL HIGH (ref 70–99)
Glucose-Capillary: 125 mg/dL — ABNORMAL HIGH (ref 70–99)

## 2019-03-03 NOTE — Progress Notes (Signed)
Lindsey Baldwin called to give her update but no answer.  Left message to return call if she would like.

## 2019-03-03 NOTE — Progress Notes (Signed)
PROGRESS NOTE  Lindsey Baldwin  [id number] DOB: December 16, 1945 DOA: 02/15/2019 PCP: Donald Prose, MD   Brief Narrative: Lindsey Baldwin is a 73 y.o. female with a history of T2DM, HTN, OSA, and GERD who initially presented to the ED 11/10 with dyspnea, returning 11/13 and diagnosed with covid-19, discharged back to ALF. With persistent and progressive symptoms she returned 11/15, was found to be hypoxic with CXR findings of bilateral infiltrates, and was admitted to [facility]. She has continued to require 15LPM supplemental oxygen, has been given remdesivir, steroids and doses of convalescent plasma (11/16) and tocilizumab (11/17). Repeat CXR later showed a dense LUL infiltrate w/air bronchogram suggesting bacterial consolidation for which ceftriaxone was given. Subsequent CTA chest showed no PE but confirmed significant bilateral GGOs consistent with covid pneumonia.   Assessment & Plan: Active Problems:   Pneumonia due to COVID-19 virus   Acute respiratory failure due to COVID-19 ([place])   DM2 (diabetes mellitus, type 2) ([place])   Essential hypertension   Hyperlipemia   OSA on CPAP  Acute hypoxic respiratory failure due to covid-19 pneumonia (POA) complicated by superimposed LUL bacterial pneumonia (not POA): Hypoxia improving.  - Continue supplemental oxygen to maintain SpO2 >88% at rest and prn respiratory distress. - Completed remdesivir (11/15 - 11/19) - Completed CTX 11/19 - 11/26 - Continue steroids, transitioned to po 11/25, began taper 11/30. CRP has been undetectable since 11/21 and patient suffered bacterial pneumonia as well.  - s/p CCP and tocilizumab. - Continue to encourage OOB, IS, FV, and proning (which appears to be very helpful for hypoxia in this patient) - Continue lovenox q12h dosing. D-dimer was up to 9.55 on 11/21, since trending downward. CTA showed no PE.  T2DM with steroid-induced hyperglycemia: Improved with insulin. Most recent HbA1c was 6.9%.  - Monitor for  deescalation as prednisone is tapered. 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 Noted.  DVT prophylaxis: Lovenox Code Status: Full Family Communication: Friend by phone per pt request Disposition Plan: SNF once hypoxia improves.  Consultants:   None  Procedures:   None  Antimicrobials:  Ceftriaxone (11/19 - 11/26)  Remdesivir (11/15 - 11/19)  Subjective: Feels better day by day, more exercise tolerance. Still severely short of breath with any exercise. No chest pain or other complaints. Gets anxious and uses NRB at night, though hypoxia overall seems to be better. Oxygen requirement decreased to 8L today.  Objective: Vitals:   03/03/19 0408 03/03/19 0905 03/03/19 1000 03/03/19 1457  BP: 118/76 117/73    Pulse: 79 97 87 92  Resp: (!) 22 (!) 22 (!) 24   Temp: 98.1 F (36.7 C) (!) 97.3 F (36.3 C)    TempSrc: Oral Oral    SpO2: 98% 93% 93% 96%  Weight:      Height:       No intake or output data in the 24 hours ending 03/03/19 1500 Filed Weights   02/15/19 2300  Weight: 75.6 kg   Gen: 73 y.o. female in no distress Pulm: Nonlabored tachypnea on 12L O2. Crackles, stable.. CV: Regular rate and rhythm. No murmur, rub, or gallop. No JVD, no dependent edema. GI: Abdomen soft, non-tender, non-distended, with normoactive bowel sounds.  Ext: Warm, no deformities Skin: No rashes, lesions or ulcers on visualized skin. Neuro: Alert and oriented. No focal neurological deficits. Psych: Judgement  and insight appear fair. Mood euthymic & affect congruent. Behavior is appropriate.    Data Reviewed: I have personally reviewed following labs and imaging studies  CBC: Recent Labs  Lab 03/01/19 0209  WBC 18.8*  NEUTROABS 13.0*  HGB 15.1*  HCT 46.7*  MCV 92.8  PLT [id number]   Basic Metabolic Panel:  Recent Labs  Lab 02/25/19 0600 02/26/19 0400 02/27/19 0220 03/01/19 0209  NA 144 138 140 139  K 4.4 4.1 3.9 3.8  CL 108 106 106 103  CO2 24 22 24 27   GLUCOSE 128* 104* 83 81  BUN 27* 26* 25* 21  CREATININE 0.69 0.64 0.60 0.67  CALCIUM 8.8* 8.5* 8.6* 8.6*   GFR: Estimated Creatinine Clearance: 61.4 mL/min (by C-G formula based on SCr of 0.67 mg/dL). Liver Function Tests: Recent Labs  Lab 02/25/19 0600 02/26/19 0400 02/27/19 0220 03/01/19 0209  AST 40 41 42* 34  ALT 40 38 39 42  ALKPHOS 103 119 114 93  BILITOT 0.8 1.0 1.1 0.9  PROT 5.7* 5.8* 5.5* 5.5*  ALBUMIN 3.0* 3.2* 3.1* 2.9*   No results for input(s): LIPASE, AMYLASE in the last 168 hours. No results for input(s): AMMONIA in the last 168 hours. Coagulation Profile: No results for input(s): INR, PROTIME in the last 168 hours. Cardiac Enzymes: No results for input(s): CKTOTAL, CKMB, CKMBINDEX, TROPONINI in the last 168 hours. BNP (last 3 results) No results for input(s): PROBNP in the last 8760 hours. HbA1C: No results for input(s): HGBA1C in the last 72 hours. CBG: Recent Labs  Lab 03/02/19 1146 03/02/19 1610 03/02/19 2045 03/03/19 0901 03/03/19 1225  GLUCAP 178* 156* 182* 123* 207*   Lipid Profile: No results for input(s): CHOL, HDL, LDLCALC, TRIG, CHOLHDL, LDLDIRECT in the last 72 hours. Thyroid Function Tests: No results for input(s): TSH, T4TOTAL, FREET4, T3FREE, THYROIDAB in the last 72 hours. Anemia Panel: Recent Labs    03/01/19 0209  FERRITIN 431*   Urine analysis:    Component Value Date/Time   COLORURINE YELLOW 05/10/2018 0830   APPEARANCEUR CLEAR 05/10/2018 0830   LABSPEC 1.020 05/10/2018 0830   PHURINE 6.0 05/10/2018 0830   GLUCOSEU 250 (A) 05/10/2018 0830   HGBUR MODERATE (A) 05/10/2018 0830   BILIRUBINUR NEGATIVE 05/10/2018 0830   KETONESUR NEGATIVE 05/10/2018 0830   PROTEINUR NEGATIVE 05/10/2018 0830   NITRITE NEGATIVE 05/10/2018 0830   LEUKOCYTESUR NEGATIVE 05/10/2018 0830    No results found for this or any previous visit (from the past 240 hour(s)).    Radiology Studies: No results found.  Scheduled Meds: . apraclonidine  1 drop Right Eye BID  . aspirin  81 mg Oral BID  . atorvastatin  40 mg Oral QPM  . chlorpheniramine-HYDROcodone  5 mL Oral Q12H  . enalapril  2.5 mg Oral Daily  . enoxaparin (LOVENOX) injection  40 mg Subcutaneous Q12H  . famotidine  20 mg Oral Daily  . insulin aspart  0-15 Units Subcutaneous TID WC  . insulin glargine  10 Units Subcutaneous Daily  . Ipratropium-Albuterol  1 puff Inhalation Q6H  . latanoprost  1 drop Right Eye QHS  . mouth rinse  15 mL Mouth Rinse BID  . mirabegron ER  50 mg Oral Daily  . polyethylene glycol  17 g Oral BID  . predniSONE  10 mg Oral Q breakfast  . sodium chloride flush  3 mL Intravenous Q12H  . sodium chloride flush  3 mL Intravenous Q12H  . timolol  1 drop Right Eye BID  Continuous Infusions: . sodium chloride       LOS: 16 days   Time spent: 25 minutes.  Patrecia Pour, MD [facility] [email or web address] 03/03/2019, 3:00 PM

## 2019-03-03 NOTE — Progress Notes (Signed)
Physical Therapy Treatment Patient Details Name: Lindsey Baldwin MRN: [id number] DOB: [birth year] Today's Date: 03/03/2019    History of Present Illness 73 y/o female w/ hx of DM II, osteoporosis, OSA on CPAP, OAB. OA. HTN, galucoma, GERD, bronchitis, L blind eye, R knee arthroplasty. Pt tested + for COVID 11/13. evaluated in the emergency department November 10, and then November 13, on her second ED visit she was diagnosed with COVID-19 and discharged to assisted living facility. her sx continue to deteriorate, she developed hypoxemia and persistent fever hence returned to hospital.    PT Comments    Patient is much improved in tolerance to activity. Patient received on 12 L HFNC, SPO2 98%. Ear is site of Sat monitor. Patient mobilized OOB to recliner and performed standing exercises, on 12 L HFNC with S PO2 79%,33 RR, 103 HR, pt used NRB x 10 secs with SPO2 up to 95%. Patient performed another stand and march in place x 1.5 minutes on 12 L, with SPO2 83%,33 RR, HR 108. Recovered with use of flutter valve and did not use the NRB with recovery to 93% within 45 seconds.  One more stand and march in place x 1 minute on 10 L HFNC with SPO2 83%, 101  30 RR, recovered within 30 secs to 93%. Left pt. On 10 L HDFNC and sent RN a chat msg. Continue  Mobility as much as possible to decrease Oxygen needs for DC to rehab.   Follow Up Recommendations  SNF     Equipment Recommendations  None recommended by PT    Recommendations for Other Services       Precautions / Restrictions Precautions Precautions: Fall Precaution Comments: monitor sats closely, try to not use NRB for recovery    Mobility  Bed Mobility         Supine to sit: Modified independent (Device/Increase time);HOB elevated     General bed mobility comments: extra time to push self  up  Transfers   Equipment used: Rolling walker (2 wheeled) Transfers: Sit to/from [facility] Sit to Stand:  Supervision Stand pivot transfers: Supervision       General transfer comment: did not use Assistance to stand and step to recliner  Ambulation/Gait Ambulation/Gait assistance: Min guard Gait Distance (Feet): 5 Feet Assistive device: Rolling walker (2 wheeled) Gait Pattern/deviations: Step-through pattern     General Gait Details: marched in place x 3 times while on 12 then 10 L HFNC, supported on RW   Stairs             Wheelchair Mobility    Modified Rankin (Stroke Patients Only)       Balance                                            Cognition Arousal/Alertness: Awake/alert Behavior During Therapy: WFL for tasks assessed/performed                                          Exercises Other Exercises Other Exercises: Pursed lip breathing exercises with min cues on technique Other Exercises: Incentive spirometer x 10 with min cues on technique. Averaging 545mL Other Exercises: flutter  valve x 10    General Comments        Pertinent Vitals/Pain Pain  Assessment: No/denies pain    Home Living                      Prior Function            PT Goals (current goals can now be found in the care plan section) Progress towards PT goals: Progressing toward goals    Frequency    Min 2X/week      PT Plan Current plan remains appropriate;Frequency needs to be updated    Co-evaluation              AM-PAC PT "6 Clicks" Mobility   Outcome Measure  Help needed turning from your back to your side while in a flat bed without using bedrails?: None Help needed moving from lying on your back to sitting on the side of a flat bed without using bedrails?: None Help needed moving to and from a bed to a chair (including a wheelchair)?: A Little Help needed standing up from a chair using your arms (e.g., wheelchair or bedside chair)?: A Little Help needed to walk in hospital room?: A Lot Help needed climbing 3-5  steps with a railing? : Total 6 Click Score: 17    End of Session Equipment Utilized During Treatment: Oxygen Activity Tolerance: Patient tolerated treatment well Patient left: in chair;with call bell/phone within reach;with chair alarm set Nurse Communication: Mobility status;Other (comment)       Time: [id number] PT Time Calculation (min) (ACUTE ONLY): 66 min  Charges:  $Therapeutic Exercise: 23-37 mins $Therapeutic Activity: 23-37 mins                     Tresa Endo PT Acute Rehabilitation Services  Office [phone]    Claretha Cooper 03/03/2019, 12:48 PM

## 2019-03-04 LAB — GLUCOSE, CAPILLARY
Glucose-Capillary: 147 mg/dL — ABNORMAL HIGH (ref 70–99)
Glucose-Capillary: 157 mg/dL — ABNORMAL HIGH (ref 70–99)
Glucose-Capillary: 223 mg/dL — ABNORMAL HIGH (ref 70–99)
Glucose-Capillary: 240 mg/dL — ABNORMAL HIGH (ref 70–99)

## 2019-03-04 MED ORDER — IPRATROPIUM-ALBUTEROL 20-100 MCG/ACT IN AERS
1.0000 | INHALATION_SPRAY | Freq: Four times a day (QID) | RESPIRATORY_TRACT | Status: DC | PRN
  Administered 2019-03-05 – 2019-03-09 (×6): 1 via RESPIRATORY_TRACT
  Filled 2019-03-04: qty 4

## 2019-03-04 MED ORDER — LINAGLIPTIN 5 MG PO TABS
5.0000 mg | ORAL_TABLET | Freq: Every day | ORAL | Status: DC
  Administered 2019-03-04 – 2019-03-13 (×10): 5 mg via ORAL
  Filled 2019-03-04 (×10): qty 1

## 2019-03-04 MED ORDER — HYDROCOD POLST-CPM POLST ER 10-8 MG/5ML PO SUER
5.0000 mL | Freq: Two times a day (BID) | ORAL | Status: DC | PRN
  Administered 2019-03-04 – 2019-03-12 (×3): 5 mL via ORAL
  Filled 2019-03-04 (×3): qty 5

## 2019-03-04 MED ORDER — TAMSULOSIN HCL 0.4 MG PO CAPS
0.4000 mg | ORAL_CAPSULE | Freq: Every day | ORAL | Status: DC
  Administered 2019-03-04 – 2019-03-13 (×10): 0.4 mg via ORAL
  Filled 2019-03-04 (×10): qty 1

## 2019-03-04 MED ORDER — FUROSEMIDE 10 MG/ML IJ SOLN
20.0000 mg | Freq: Two times a day (BID) | INTRAMUSCULAR | Status: DC
  Administered 2019-03-04 – 2019-03-05 (×2): 20 mg via INTRAVENOUS
  Filled 2019-03-04 (×2): qty 2

## 2019-03-04 NOTE — Progress Notes (Signed)
Lindsey Baldwin  [id number] DOB: 1945-05-25 DOA: 02/15/2019 PCP: Donald Prose, MD    Brief Narrative:  73 year old with a history of DM 2, HTN, OSA, and GERD who returned to the ED 11/13 with progressing worsening of dyspnea.  At that time she was stable enough to return to her ALF.  She returned to the ED again 11/15 and was found to be hypoxic with bilateral infiltrates on a CXR and admitted to [facility].  Significant Events: 11/10 ED evaluation 11/13 ED evaluation 11/15 admit to [facility] via ED  COVID-19 specific Treatment: Decadron Remdesivir 11/15 >11/19 Convalescent plasma 11/16 Actemra 11/17  Subjective: Continues to require high flow nasal cannula oxygen support.  In no acute respiratory distress.  Able to carry on a conversation.  Denies chest pain nausea vomiting or abdominal pain.  Assessment & Plan:  Covid pneumonia -acute hypoxic respiratory failure Has completed a course of remdesivir and steroid - no PE via CTa -protracted recovery with patient still requiring HFNC -encouraged use of incentive spirometer and frequent mobilization -attempt to gently diurese  Recent Labs  Lab 02/26/19 0400 02/27/19 0220 03/01/19 0209  DDIMER 3.32* 3.07* 2.07*  FERRITIN 399* 457* 431*  CRP <0.8 <0.8 <0.8  ALT 38 39 42    Superimposed left upper lobe bacterial pneumonia Has completed a course of ceftriaxone -no evidence of persisting infection at this time  DM 2 uncontrolled with hyperglycemia Most recent A1c 6.9 -CBG stable presently  HTN Blood pressure well controlled  HLD Continue usual statin dose  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   Weight as of this encounter: 75.6 kg.    DVT prophylaxis: Lovenox Code Status: FULL CODE Family Communication:  Disposition  Plan: will need a SNF for rehab   Consultants:  none  Antimicrobials:  Rocephin 1119 > 11/26  Objective: Blood pressure 106/63, pulse 86, temperature 98.1 F (36.7 C), temperature source Oral, resp. rate (!) 23, height 5\' 2"  (1.575 m), weight 75.6 kg, SpO2 93 %.  Intake/Output Summary (Last 24 hours) at 03/04/2019 0843 Last data filed at 03/03/2019 1300 Gross per 24 hour  Intake 480 ml  Output -  Net 480 ml   Filed Weights   02/15/19 2300  Weight: 75.6 kg    Examination: General: No acute respiratory distress Lungs: Scattered diffuse crackles with no wheezing Cardiovascular: Regular rate and rhythm without murmur gallop or rub normal S1 and S2 Abdomen: Nontender, nondistended, soft, bowel sounds positive, no rebound, no ascites, no appreciable mass Extremities: 1+ bilateral lower extremity edema  CBC: Recent Labs  Lab 03/01/19 0209  WBC 18.8*  NEUTROABS 13.0*  HGB 15.1*  HCT 46.7*  MCV 92.8  PLT [id number]   Basic Metabolic Panel: Recent Labs  Lab 02/26/19 0400 02/27/19 0220 03/01/19 0209  NA 138 140 139  K 4.1 3.9 3.8  CL 106 106 103  CO2 22 24 27   GLUCOSE 104* 83 81  BUN 26* 25* 21  CREATININE 0.64 0.60 0.67  CALCIUM 8.5* 8.6* 8.6*   GFR: Estimated Creatinine Clearance: 61.4 mL/min (by C-G formula based on SCr of 0.67 mg/dL).  Liver Function Tests: Recent Labs  Lab 02/26/19 0400 02/27/19 0220 03/01/19 0209  AST 41 42* 34  ALT 38 39 42  ALKPHOS 119 114 93  BILITOT 1.0 1.1 0.9  PROT 5.8* 5.5* 5.5*  ALBUMIN 3.2* 3.1* 2.9*    HbA1C: Hgb A1c MFr Bld  Date/Time Value Ref Range Status  06/12/2018 02:17 PM 6.9 (H) 4.8 - 5.6 % Final    Comment:    (NOTE) Pre diabetes:          5.7%-6.4% Diabetes:              >6.4% Glycemic control for   <7.0% adults with diabetes   12/04/2017 02:15 PM 7.2 (H) 4.8 - 5.6 % Final    Comment:    (NOTE) Pre diabetes:          5.7%-6.4% Diabetes:              >6.4% Glycemic control for   <7.0% adults with diabetes      CBG: Recent Labs  Lab 03/03/19 0901 03/03/19 1225 03/03/19 1630 03/03/19 2142 03/04/19 0754  GLUCAP 123* 207* 191* 125* 147*    No results found for this or any previous visit (from the past 240 hour(s)).   Scheduled Meds: . apraclonidine  1 drop Right Eye BID  . aspirin  81 mg Oral BID  . atorvastatin  40 mg Oral QPM  . chlorpheniramine-HYDROcodone  5 mL Oral Q12H  . enalapril  2.5 mg Oral Daily  . enoxaparin (LOVENOX) injection  40 mg Subcutaneous Q12H  . famotidine  20 mg Oral Daily  . insulin aspart  0-15 Units Subcutaneous TID WC  . insulin glargine  10 Units Subcutaneous Daily  . Ipratropium-Albuterol  1 puff Inhalation Q6H  . latanoprost  1 drop Right Eye QHS  . mouth rinse  15 mL Mouth Rinse BID  . mirabegron ER  50 mg Oral Daily  . polyethylene glycol  17 g Oral BID  . predniSONE  10 mg Oral Q breakfast  . sodium chloride flush  3 mL Intravenous Q12H  . sodium chloride flush  3 mL Intravenous Q12H  . timolol  1 drop Right Eye BID     LOS: 17 days   Cherene Altes, MD [facility] Office  [phone] Pager - Text Page per Shea Evans  If 7PM-7AM, please contact night-coverage per Amion 03/04/2019, 8:43 AM

## 2019-03-05 LAB — GLUCOSE, CAPILLARY
Glucose-Capillary: 228 mg/dL — ABNORMAL HIGH (ref 70–99)
Glucose-Capillary: 175 mg/dL — ABNORMAL HIGH (ref 70–99)
Glucose-Capillary: 179 mg/dL — ABNORMAL HIGH (ref 70–99)
Glucose-Capillary: 172 mg/dL — ABNORMAL HIGH (ref 70–99)
Glucose-Capillary: 118 mg/dL — ABNORMAL HIGH (ref 70–99)

## 2019-03-05 LAB — COMPREHENSIVE METABOLIC PANEL
ALT: 57 U/L — ABNORMAL HIGH (ref 0–44)
AST: 39 U/L (ref 15–41)
Albumin: 2.9 g/dL — ABNORMAL LOW (ref 3.5–5.0)
Alkaline Phosphatase: 73 U/L (ref 38–126)
Anion gap: 14 (ref 5–15)
BUN: 14 mg/dL (ref 8–23)
CO2: 22 mmol/L (ref 22–32)
Calcium: 8.1 mg/dL — ABNORMAL LOW (ref 8.9–10.3)
Chloride: 99 mmol/L (ref 98–111)
Creatinine, Ser: 0.63 mg/dL (ref 0.44–1.00)
GFR calc Af Amer: >60 mL/min (ref >60)
GFR calc non Af Amer: >60 mL/min (ref >60)
Glucose, Bld: 113 mg/dL — ABNORMAL HIGH (ref 70–99)
Potassium: 4 mmol/L (ref 3.5–5.1)
Sodium: 135 mmol/L (ref 135–145)
Total Bilirubin: 0.9 mg/dL (ref 0.3–1.2)
Total Protein: 5.1 g/dL — ABNORMAL LOW (ref 6.5–8.1)

## 2019-03-05 MED ORDER — FUROSEMIDE 10 MG/ML IJ SOLN
40.0000 mg | Freq: Two times a day (BID) | INTRAMUSCULAR | Status: DC
  Administered 2019-03-06: 40 mg via INTRAVENOUS
  Filled 2019-03-05 (×2): qty 4

## 2019-03-05 MED ORDER — INSULIN GLARGINE 100 UNIT/ML ~~LOC~~ SOLN
16.0000 [IU] | Freq: Every day | SUBCUTANEOUS | Status: DC
  Administered 2019-03-05 – 2019-03-13 (×9): 16 [IU] via SUBCUTANEOUS
  Filled 2019-03-05 (×9): qty 0.16

## 2019-03-05 NOTE — Progress Notes (Signed)
Physical Therapy Treatment Patient Details Name: Lindsey Baldwin MRN: [id number] DOB: Aug 20, 1945 Today's Date: 03/05/2019    History of Present Illness 73 y/o female w/ hx of DM II, osteoporosis, OSA on CPAP, OAB. OA. HTN, galucoma, GERD, bronchitis, L blind eye, R knee arthroplasty. Pt tested + for COVID 11/13. evaluated in the emergency department November 10, and then November 13, on her second ED visit she was diagnosed with COVID-19 and discharged to assisted living facility. her sx continue to deteriorate, she developed hypoxemia and persistent fever hence returned to hospital.    PT Comments    The patient is improving in tolerance to activity on less oxygen. Patient ambulated x 12 ' on 8 l HFNC with noted drop to 75%( finger probe), HR 110, RR 26. Patient requested PNRB and given to pt, Spo2 returned to > 90% within 10 seconds. Patient ambulated again x 12' then 25' with forehead probe used, on 8 L HFNC with lowest SPO2 83%, HR 101 and dyspnea 3/4 with recovery to 94% within 30 seconds. No use of NRB needed and have   This is the best day for the patient as noted by increased tolerance to ambulation, decreased O2 and not requiring NRB for recovery. Forehead probe readings are ~ 3 points lower than finger. Patient left on 6 L [place] with finger SPO2 95%, RN aware. Patient pleased that she ambulated today x 3.  Follow Up Recommendations  SNF     Equipment Recommendations  None recommended by PT    Recommendations for Other Services       Precautions / Restrictions Precautions Precaution Comments: monitor sats closely, no PNRB anymore to recover, use flutter    Mobility  Bed Mobility               General bed mobility comments: in recliner  Transfers Overall transfer level: Needs assistance Equipment used: 4-wheeled walker Transfers: Sit to/from Stand Sit to Stand: Supervision         General transfer comment: did not use Assistance to stand but with some effort  from recliner  Ambulation/Gait Ambulation/Gait assistance: Min guard Gait Distance (Feet): 12 Feet(then 12 then 25) Assistive device: 4-wheeled walker Gait Pattern/deviations: Step-through pattern Gait velocity: decr   General Gait Details: patient able to progeress distance. On 8 L HFNC for mobility   Stairs             Wheelchair Mobility    Modified Rankin (Stroke Patients Only)       Balance Overall balance assessment: Needs assistance   Sitting balance-Leahy Scale: Good     Standing balance support: Bilateral upper extremity supported;During functional activity Standing balance-Leahy Scale: Fair                              Cognition Arousal/Alertness: Awake/alert Behavior During Therapy: WFL for tasks assessed/performed                                   General Comments: very motivated to progress      Exercises      General Comments        Pertinent Vitals/Pain Pain Assessment: No/denies pain    Home Living                      Prior Function  PT Goals (current goals can now be found in the care plan section) Acute Rehab PT Goals Patient Stated Goal: to go to rehab Time For Goal Achievement: 03/19/19 Potential to Achieve Goals: Good Progress towards PT goals: Progressing toward goals    Frequency    Min 2X/week      PT Plan Current plan remains appropriate;Frequency needs to be updated    Co-evaluation              AM-PAC PT "6 Clicks" Mobility   Outcome Measure  Help needed turning from your back to your side while in a flat bed without using bedrails?: None Help needed moving from lying on your back to sitting on the side of a flat bed without using bedrails?: None Help needed moving to and from a bed to a chair (including a wheelchair)?: A Little Help needed standing up from a chair using your arms (e.g., wheelchair or bedside chair)?: A Little Help needed to walk in  hospital room?: A Little Help needed climbing 3-5 steps with a railing? : Total 6 Click Score: 18    End of Session Equipment Utilized During Treatment: Oxygen Activity Tolerance: Patient tolerated treatment well Patient left: in chair;with call bell/phone within reach;with chair alarm set Nurse Communication: Mobility status;Other (comment) PT Visit Diagnosis: Other abnormalities of gait and mobility (R26.89);Muscle weakness (generalized) (M62.81)     Time: [id number] PT Time Calculation (min) (ACUTE ONLY): 63 min  Charges:  $Gait Training: 23-37 mins $Self Care/Home Management: [place]  Office [phone]    Claretha Cooper 03/05/2019, 1:27 PM

## 2019-03-05 NOTE — Progress Notes (Signed)
Lindsey Baldwin  [id number] DOB: 1945/04/26 DOA: 02/15/2019 PCP: Donald Prose, MD    Brief Narrative:  73 year old with a history of DM 2, HTN, OSA, and GERD who returned to the ED 11/13 with progressing worsening of dyspnea.  At that time she was stable enough to return to her ALF.  She returned to the ED again 11/15 and was found to be hypoxic with bilateral infiltrates on a CXR and admitted to [facility].  Significant Events: 11/10 ED evaluation 11/13 ED evaluation 11/15 admit to [facility] via ED  COVID-19 specific Treatment: Decadron 11/15 > 11/25 Remdesivir 11/15 >11/19 Convalescent plasma 11/16 Actemra 11/17  Subjective: No new complaints today.  Feels that she is slightly improved/slightly less short of breath.  Denies abdominal pain or chest pain.  Assessment & Plan:  Covid pneumonia - acute hypoxic respiratory failure Has completed a course of remdesivir and steroid - no PE via CTa - protracted recovery but making progress - encouraged use of incentive spirometer and frequent mobilization - cont to gently diurese (presently net +10L for the admit)  Recent Labs  Lab 02/27/19 0220 03/01/19 0209 03/05/19 0500  DDIMER 3.07* 2.07*  --   FERRITIN 457* 431*  --   CRP <0.8 <0.8  --   ALT 39 42 57*    Superimposed left upper lobe bacterial pneumonia Has completed a course of ceftriaxone -no evidence of persisting infection at this time  DM 2 uncontrolled with hyperglycemia Most recent A1c 6.9 -CBG not quite at goal - adjust tx today and monitor   HTN Blood pressure well controlled  HLD Continue usual statin dose  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   Weight as of this encounter: 75.6 kg.   DVT prophylaxis: Lovenox Code Status: FULL CODE Family Communication:   Disposition Plan: needs a SNF for rehab   Consultants:  none  Antimicrobials:  Rocephin 1119 > 11/26  Objective: Blood pressure 112/64, pulse 97, temperature (!) 97.5 F (36.4 C), temperature source Oral, resp. rate 14, height 5\' 2"  (1.575 m), weight 75.6 kg, SpO2 95 %.  Intake/Output Summary (Last 24 hours) at 03/05/2019 0902 Last data filed at 03/05/2019 0500 Gross per 24 hour  Intake 660 ml  Output 700 ml  Net -40 ml   Filed Weights   02/15/19 2300  Weight: 75.6 kg    Examination: General: No acute respiratory distress Lungs: Scattered diffuse crackles without change Cardiovascular: RRR without murmur or rub Abdomen: NT/ND, soft, BS positive Extremities: 1+ bilateral LE  CBC: Recent Labs  Lab 03/01/19 0209  WBC 18.8*  NEUTROABS 13.0*  HGB 15.1*  HCT 46.7*  MCV 92.8  PLT [id number]   Basic Metabolic Panel: Recent Labs  Lab 02/27/19 0220 03/01/19 0209 03/05/19 0500  NA 140 139 135  K 3.9 3.8 4.0  CL 106 103 99  CO2 24 27 22   GLUCOSE 83 81 113*  BUN 25* 21 14  CREATININE 0.60 0.67 0.63  CALCIUM 8.6* 8.6* 8.1*   GFR: Estimated Creatinine Clearance: 61.4 mL/min (by C-G formula based on SCr of 0.63 mg/dL).  Liver Function Tests: Recent Labs  Lab 02/27/19 0220 03/01/19 0209 03/05/19 0500  AST 42* 34 39  ALT 39 42 57*  ALKPHOS 114 93 73  BILITOT 1.1 0.9 0.9  PROT 5.5* 5.5*  5.1*  ALBUMIN 3.1* 2.9* 2.9*    HbA1C: Hgb A1c MFr Bld  Date/Time Value Ref Range Status  06/12/2018 02:17 PM 6.9 (H) 4.8 - 5.6 % Final    Comment:    (NOTE) Pre diabetes:          5.7%-6.4% Diabetes:              >6.4% Glycemic control for   <7.0% adults with diabetes   12/04/2017 02:15 PM 7.2 (H) 4.8 - 5.6 % Final    Comment:    (NOTE) Pre diabetes:          5.7%-6.4% Diabetes:              >6.4% Glycemic control for   <7.0% adults with diabetes     CBG: Recent Labs  Lab 03/04/19 0754 03/04/19 1146 03/04/19 1624 03/04/19 2003 03/05/19 0737  GLUCAP 147* 157*  223* 240* 175*     Scheduled Meds: . apraclonidine  1 drop Right Eye BID  . aspirin  81 mg Oral BID  . atorvastatin  40 mg Oral QPM  . enalapril  2.5 mg Oral Daily  . enoxaparin (LOVENOX) injection  40 mg Subcutaneous Q12H  . famotidine  20 mg Oral Daily  . furosemide  20 mg Intravenous Q12H  . insulin aspart  0-15 Units Subcutaneous TID WC  . insulin glargine  10 Units Subcutaneous Daily  . latanoprost  1 drop Right Eye QHS  . linagliptin  5 mg Oral Daily  . mouth rinse  15 mL Mouth Rinse BID  . mirabegron ER  50 mg Oral Daily  . polyethylene glycol  17 g Oral BID  . sodium chloride flush  3 mL Intravenous Q12H  . tamsulosin  0.4 mg Oral Daily  . timolol  1 drop Right Eye BID     LOS: 18 days   Cherene Altes, MD [facility] Office  [phone] Pager - Text Page per Amion  If 7PM-7AM, please contact night-coverage per Amion 03/05/2019, 9:02 AM

## 2019-03-05 NOTE — Progress Notes (Signed)
Occupational Therapy Treatment Patient Details Name: Lindsey Baldwin MRN: [id number] DOB: Apr 23, 1945 Today's Date: 03/05/2019    History of present illness 73 y/o female w/ hx of DM II, osteoporosis, OSA on CPAP, OAB. OA. HTN, galucoma, GERD, bronchitis, L blind eye, R knee arthroplasty. Pt tested + for COVID 11/13. evaluated in the emergency department November 10, and then November 13, on her second ED visit she was diagnosed with COVID-19 and discharged to assisted living facility. her sx continue to deteriorate, she developed hypoxemia and persistent fever hence returned to hospital.   OT comments  Pt progressing in therapy, however pt noted to have increased confusion and anxiety this date requiring mod encouragement throughout. Pt able to transfer to/from [facility] with RW and min guard. Noted 1 instance of LOB with pt able to self-correct. Pt tolerated standing x 3 to complete toilet hygiene and pants management following BM. After each stand or transfer pt continually requesting to use NRB in addition to 6L HFNC. Continued education with pt on relaxation strategies, use of flutter valve, and pursed lip breathing to slow RR and increase O2 intake with poor to fair follow through. NRB provided to pt to decrease anxiety. Pt's O2 SATs dropped to 86% during activity, noting quick return back to 90s. 2/4 DOE. Pt on 6L HFNC with O2 SATs at 95% at end of session.    Follow Up Recommendations  SNF    Equipment Recommendations       Recommendations for Other Services      Precautions / Restrictions Precautions Precautions: Fall Precaution Comments: monitor sats closely, no PNRB anymore to recover, use flutter Restrictions Weight Bearing Restrictions: No       Mobility Bed Mobility               General bed mobility comments: in recliner  Transfers Overall transfer level: Needs assistance Equipment used: Rolling walker (2 wheeled) Transfers: Sit to/from [employer] Sit to Stand: Min guard Stand pivot transfers: Min guard       General transfer comment: Pt able to transfer to/from [facility]. Pt more anxious this date in regards to SOB. Mod rest breaks throughout with pt continually asking for NRB mask    Balance Overall balance assessment: Needs assistance   Sitting balance-Leahy Scale: Good     Standing balance support: Bilateral upper extremity supported;During functional activity Standing balance-Leahy Scale: Fair                             ADL either performed or assessed with clinical judgement   ADL Overall ADL's : Needs assistance/impaired     Grooming: Set up;Sitting   Upper Body Bathing: Set up;Sitting               Toilet Transfer: Min guard;BSC   Toileting- Clothing Manipulation and Hygiene: Minimal assistance;Sit to/from stand       Functional mobility during ADLs: Passenger transport manager     Praxis      Cognition Arousal/Alertness: Awake/alert Behavior During Therapy: Anxious Overall Cognitive Status: (Pt seems more confused today)                                 General Comments: Pt more anxious this date        Exercises Exercises: Other exercises  Other Exercises Other Exercises: Pursed lip breathing exercises with min cues on technique Other Exercises: Incentive spirometer x 10 with min cues on technique. Averaging 597mL Other Exercises: flutter  valve x 10   Shoulder Instructions       General Comments O2 SATs 86% to 95% on 6L HFNC. RR 20-30s. Pt continually asking for NRB due to anxiety related to SOB    Pertinent Vitals/ Pain       Pain Assessment: No/denies pain  Home Living                                          Prior Functioning/Environment              Frequency           Progress Toward Goals  OT Goals(current goals can now be found in the care plan section)  Progress towards OT  goals: Progressing toward goals  Acute Rehab OT Goals Patient Stated Goal: To go to rehab ADL Goals Pt Will Perform Grooming: with supervision;standing Pt Will Perform Lower Body Dressing: with supervision;sit to/from stand;sitting/lateral leans Pt Will Transfer to Toilet: with supervision;ambulating Pt Will Perform Toileting - Clothing Manipulation and hygiene: with supervision;sit to/from stand Additional ADL Goal #1: Pt will recall and demonstrate breathing exercises with 0 verbal cues required.  Plan Discharge plan remains appropriate    Co-evaluation                 AM-PAC OT "6 Clicks" Daily Activity     Outcome Measure   Help from another person eating meals?: None Help from another person taking care of personal grooming?: A Little Help from another person toileting, which includes using toliet, bedpan, or urinal?: A Little Help from another person bathing (including washing, rinsing, drying)?: A Little Help from another person to put on and taking off regular upper body clothing?: A Little Help from another person to put on and taking off regular lower body clothing?: A Little 6 Click Score: 19    End of Session Equipment Utilized During Treatment: Rolling walker;Oxygen  OT Visit Diagnosis: Unsteadiness on feet (R26.81);Muscle weakness (generalized) (M62.81)   Activity Tolerance Patient limited by fatigue(Limited by SOB and anxiety)   Patient Left in chair;with call bell/phone within reach;with chair alarm set;with nursing/sitter in room   Nurse Communication Mobility status        Time: [id number] OT Time Calculation (min): 31 min  Charges: OT General Charges $OT Visit: 1 Visit OT Treatments $Self Care/Home Management : 8-22 mins $Therapeutic Exercise: 8-22 mins  Mauri Brooklyn OTR/L [phone]    Mauri Brooklyn 03/05/2019, 3:04 PM

## 2019-03-06 LAB — BASIC METABOLIC PANEL
Anion gap: 13 (ref 5–15)
BUN: 18 mg/dL (ref 8–23)
CO2: 29 mmol/L (ref 22–32)
Calcium: 8.6 mg/dL — ABNORMAL LOW (ref 8.9–10.3)
Chloride: 96 mmol/L — ABNORMAL LOW (ref 98–111)
Creatinine, Ser: 0.7 mg/dL (ref 0.44–1.00)
GFR calc Af Amer: >60 mL/min (ref >60)
GFR calc non Af Amer: >60 mL/min (ref >60)
Glucose, Bld: 130 mg/dL — ABNORMAL HIGH (ref 70–99)
Potassium: 3.5 mmol/L (ref 3.5–5.1)
Sodium: 138 mmol/L (ref 135–145)

## 2019-03-06 LAB — CBC
HCT: 41.6 % (ref 36.0–46.0)
Hemoglobin: 13.9 g/dL (ref 12.0–15.0)
MCH: 30 pg (ref 26.0–34.0)
MCHC: 33.4 g/dL (ref 30.0–36.0)
MCV: 89.8 fL (ref 80.0–100.0)
Platelets: 189 10*3/uL (ref 150–400)
RBC: 4.63 MIL/uL (ref 3.87–5.11)
RDW: 13.5 % (ref 11.5–15.5)
WBC: 19.2 10*3/uL — ABNORMAL HIGH (ref 4.0–10.5)
nRBC: 0 % (ref 0.0–0.2)

## 2019-03-06 LAB — GLUCOSE, CAPILLARY
Glucose-Capillary: 153 mg/dL — ABNORMAL HIGH (ref 70–99)
Glucose-Capillary: 214 mg/dL — ABNORMAL HIGH (ref 70–99)
Glucose-Capillary: 145 mg/dL — ABNORMAL HIGH (ref 70–99)

## 2019-03-06 NOTE — Progress Notes (Signed)
Lindsey Baldwin  [id number] DOB: December 22, 1945 DOA: 02/15/2019 PCP: Donald Prose, MD    Brief Narrative:  73 year old with a history of DM 2, HTN, OSA, and GERD who returned to the ED 11/13 with progressing worsening of dyspnea.  At that time she was stable enough to return to her ALF.  She returned to the ED again 11/15 and was found to be hypoxic with bilateral infiltrates on a CXR and admitted to [facility].  Significant Events: 11/10 ED evaluation 11/13 ED evaluation 11/15 admit to [facility] via ED  COVID-19 specific Treatment: Decadron 11/15 > 11/25 Remdesivir 11/15 >11/19 Convalescent plasma 11/16 Actemra 11/17  Subjective: Remains on high flow nasal cannula at 6 L with saturations in the low 90s.  Some sinus tachycardia noted this morning with very mild hypotension.  We will need to hold diuretic for now.  No new complaints at time of exam.  No respiratory distress.  Assessment & Plan:  Covid pneumonia - acute hypoxic respiratory failure Has completed a course of remdesivir and steroid - no PE via CTa - protracted recovery but making progress - encouraged use of incentive spirometer and frequent mobilization - cont to gently diurese when blood pressure allows (presently net +9.5L for the admit)  Superimposed left upper lobe bacterial pneumonia Has completed a course of ceftriaxone -no evidence of persisting infection at this time  DM2 uncontrolled with hyperglycemia Most recent A1c 6.9 - CBG much improved  HTN Mild hypotension today -stopping ACE inhibitor -holding diuretic  HLD Continue usual statin dose  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   Weight as of this encounter: 75.6 kg.   DVT prophylaxis: Lovenox Code Status: FULL CODE Family Communication:  Disposition  Plan: needs a SNF for rehab   Consultants:  none  Antimicrobials:  Rocephin 1119 > 11/26  Objective: Blood pressure 97/65, pulse 81, temperature 98.3 F (36.8 C), temperature source Oral, resp. rate (!) 22, height 5\' 2"  (1.575 m), weight 75.6 kg, SpO2 93 %.  Intake/Output Summary (Last 24 hours) at 03/06/2019 0901 Last data filed at 03/05/2019 2223 Gross per 24 hour  Intake 123 ml  Output 650 ml  Net -527 ml   Filed Weights   02/15/19 2300  Weight: 75.6 kg    Examination: General: No acute respiratory distress Lungs: Scattered diffuse crackles  Cardiovascular: RRR without murmur or rub Abdomen: NT/ND, soft, BS positive Extremities: trace bilateral LE  CBC: Recent Labs  Lab 03/01/19 0209 03/06/19 0151  WBC 18.8* 19.2*  NEUTROABS 13.0*  --   HGB 15.1* 13.9  HCT 46.7* 41.6  MCV 92.8 89.8  PLT 240 [id number]   Basic Metabolic Panel: Recent Labs  Lab 03/01/19 0209 03/05/19 0500 03/06/19 0151  NA 139 135 138  K 3.8 4.0 3.5  CL 103 99 96*  CO2 27 22 29   GLUCOSE 81 113* 130*  BUN 21 14 18   CREATININE 0.67 0.63 0.70  CALCIUM 8.6* 8.1* 8.6*   GFR: Estimated Creatinine Clearance: 61.4 mL/min (by C-G formula based on SCr of 0.7 mg/dL).  Liver Function Tests: Recent Labs  Lab 03/01/19 0209 03/05/19 0500  AST 34 39  ALT 42 57*  ALKPHOS 93 73  BILITOT 0.9 0.9  PROT 5.5* 5.1*  ALBUMIN 2.9* 2.9*    HbA1C: Hgb A1c MFr Bld  Date/Time Value Ref Range Status  06/12/2018 02:17 PM 6.9 (H) 4.8 - 5.6 % Final    Comment:    (NOTE) Pre diabetes:          5.7%-6.4% Diabetes:              >6.4% Glycemic control for   <7.0% adults with diabetes   12/04/2017 02:15 PM 7.2 (H) 4.8 - 5.6 % Final    Comment:    (NOTE) Pre diabetes:          5.7%-6.4% Diabetes:              >6.4% Glycemic control for   <7.0% adults with diabetes     CBG: Recent Labs  Lab 03/04/19 2003 03/05/19 0737 03/05/19 1107 03/05/19 1555 03/05/19 2201  GLUCAP 240* 175* 179* 172* 118*      Scheduled Meds: . apraclonidine  1 drop Right Eye BID  . aspirin  81 mg Oral BID  . atorvastatin  40 mg Oral QPM  . enalapril  2.5 mg Oral Daily  . enoxaparin (LOVENOX) injection  40 mg Subcutaneous Q12H  . famotidine  20 mg Oral Daily  . furosemide  40 mg Intravenous Q12H  . insulin aspart  0-15 Units Subcutaneous TID WC  . insulin glargine  16 Units Subcutaneous Daily  . latanoprost  1 drop Right Eye QHS  . linagliptin  5 mg Oral Daily  . mouth rinse  15 mL Mouth Rinse BID  . mirabegron ER  50 mg Oral Daily  . polyethylene glycol  17 g Oral BID  . sodium chloride flush  3 mL Intravenous Q12H  . tamsulosin  0.4 mg Oral Daily  . timolol  1 drop Right Eye BID     LOS: 19 days   Cherene Altes, MD [facility] Office  [phone] Pager - Text Page per Amion  If 7PM-7AM, please contact night-coverage per Amion 03/06/2019, 9:01 AM

## 2019-03-07 LAB — GLUCOSE, CAPILLARY
Glucose-Capillary: 178 mg/dL — ABNORMAL HIGH (ref 70–99)
Glucose-Capillary: 177 mg/dL — ABNORMAL HIGH (ref 70–99)
Glucose-Capillary: 164 mg/dL — ABNORMAL HIGH (ref 70–99)
Glucose-Capillary: 196 mg/dL — ABNORMAL HIGH (ref 70–99)

## 2019-03-07 MED ORDER — FUROSEMIDE 10 MG/ML IJ SOLN
40.0000 mg | Freq: Two times a day (BID) | INTRAMUSCULAR | Status: DC
  Administered 2019-03-07 – 2019-03-08 (×3): 40 mg via INTRAVENOUS
  Filled 2019-03-07 (×4): qty 4

## 2019-03-07 MED ORDER — METOPROLOL TARTRATE 25 MG PO TABS
12.5000 mg | ORAL_TABLET | Freq: Two times a day (BID) | ORAL | Status: DC
  Administered 2019-03-07 – 2019-03-13 (×11): 12.5 mg via ORAL
  Filled 2019-03-07 (×12): qty 1

## 2019-03-07 NOTE — Progress Notes (Signed)
Lindsey Baldwin  [id number] DOB: [birth year] DOA: 02/15/2019 PCP: Donald Prose, MD    Brief Narrative:  73 year old with a history of DM 2, HTN, OSA, and GERD who returned to the ED 11/13 with progressing worsening of dyspnea.  At that time she was stable enough to return to her ALF.  She returned to the ED again 11/15 and was found to be hypoxic with bilateral infiltrates on a CXR and admitted to [facility].  Significant Events: 11/10 ED evaluation 11/13 ED evaluation 11/15 admit to [facility] via ED  COVID-19 specific Treatment: Decadron 11/15 > 11/25 Remdesivir 11/15 >11/19 Convalescent plasma 11/16 Actemra 11/17  Subjective: Resting comfortably in a bedside chair.  In good spirits.  No new complaints today.  Continues to require significant oxygen support.  Assessment & Plan:  Covid pneumonia - acute hypoxic respiratory failure Has completed a course of remdesivir and steroid - no PE via CTa - protracted recovery but making progress - encouraged use of incentive spirometer and frequent mobilization - cont to gently diurese as blood pressure allows (presently net +9.5L for the admit)  Superimposed left upper lobe bacterial pneumonia Has completed a course of ceftriaxone - no evidence of persisting infection at this time / afebrile   DM2 uncontrolled with hyperglycemia Most recent A1c 6.9 - CBG within target range at this time  HTN Mild hypotension encounter 12/4 - stopped ACE inhibitor - attempting to resume diuretic today  HLD Continue usual statin dose  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   Weight as of this encounter: 75.6 kg.   DVT prophylaxis: Lovenox Code Status: FULL CODE Family Communication:  Disposition Plan: needs a SNF for rehab   Consultants:  none   Antimicrobials:  Rocephin 1119 > 11/26  Objective: Blood pressure 128/73, pulse 93, temperature 98.3 F (36.8 C), temperature source Oral, resp. rate 18, height 5\' 2"  (1.575 m), weight 75.6 kg, SpO2 95 %.  Intake/Output Summary (Last 24 hours) at 03/07/2019 0842 Last data filed at 03/07/2019 0413 Gross per 24 hour  Intake 840 ml  Output 1400 ml  Net -560 ml   Filed Weights   02/15/19 2300  Weight: 75.6 kg    Examination: General: No acute respiratory distress - alert and pleasant  Lungs: Scattered diffuse crackles -  No wheezing   Cardiovascular: RRR without murmur or rub Abdomen: NT/ND, soft, BS positive Extremities: trace B LE  CBC: Recent Labs  Lab 03/01/19 0209 03/06/19 0151  WBC 18.8* 19.2*  NEUTROABS 13.0*  --   HGB 15.1* 13.9  HCT 46.7* 41.6  MCV 92.8 89.8  PLT 240 99991111   Basic Metabolic Panel: Recent Labs  Lab 03/01/19 0209 03/05/19 0500 03/06/19 0151  NA 139 135 138  K 3.8 4.0 3.5  CL 103 99 96*  CO2 27 22 29   GLUCOSE 81 113* 130*  BUN 21 14 18   CREATININE 0.67 0.63 0.70  CALCIUM 8.6* 8.1* 8.6*   GFR: Estimated Creatinine Clearance: 61.4 mL/min (by C-G formula based on SCr of 0.7 mg/dL).  Liver Function Tests: Recent Labs  Lab 03/01/19 0209 03/05/19 0500  AST 34 39  ALT 42 57*  ALKPHOS 93 73  BILITOT 0.9 0.9  PROT 5.5* 5.1*  ALBUMIN 2.9* 2.9*    HbA1C: Hgb A1c MFr Bld  Date/Time  Value Ref Range Status  06/12/2018 02:17 PM 6.9 (H) 4.8 - 5.6 % Final    Comment:    (NOTE) Pre diabetes:          5.7%-6.4% Diabetes:              >6.4% Glycemic control for   <7.0% adults with diabetes   12/04/2017 02:15 PM 7.2 (H) 4.8 - 5.6 % Final    Comment:    (NOTE) Pre diabetes:          5.7%-6.4% Diabetes:              >6.4% Glycemic control for   <7.0% adults with diabetes     CBG: Recent Labs  Lab 03/05/19 1555 03/05/19 2201 03/06/19 0740 03/06/19 1103 03/06/19 1605  GLUCAP 172* 118* 153* 214* 145*     Scheduled Meds: .  apraclonidine  1 drop Right Eye BID  . aspirin  81 mg Oral BID  . atorvastatin  40 mg Oral QPM  . enoxaparin (LOVENOX) injection  40 mg Subcutaneous Q12H  . famotidine  20 mg Oral Daily  . insulin aspart  0-15 Units Subcutaneous TID WC  . insulin glargine  16 Units Subcutaneous Daily  . latanoprost  1 drop Right Eye QHS  . linagliptin  5 mg Oral Daily  . mouth rinse  15 mL Mouth Rinse BID  . mirabegron ER  50 mg Oral Daily  . polyethylene glycol  17 g Oral BID  . sodium chloride flush  3 mL Intravenous Q12H  . tamsulosin  0.4 mg Oral Daily  . timolol  1 drop Right Eye BID     LOS: 20 days   Cherene Altes, MD [facility] Office  [phone] Pager - Text Page per Amion  If 7PM-7AM, please contact night-coverage per Amion 03/07/2019, 8:42 AM

## 2019-03-07 NOTE — Progress Notes (Signed)
Spoke with patients POA and gave an update on patients inpatient clinical status. POA admits being pleased with patients current status.

## 2019-03-08 ENCOUNTER — Inpatient Hospital Stay (HOSPITAL_COMMUNITY): Payer: Medicare Other

## 2019-03-08 DIAGNOSIS — R7989 Other specified abnormal findings of blood chemistry: Secondary | ICD-10-CM | POA: Diagnosis not present

## 2019-03-08 LAB — BASIC METABOLIC PANEL
Anion gap: 20 — ABNORMAL HIGH (ref 5–15)
BUN: 25 mg/dL — ABNORMAL HIGH (ref 8–23)
CO2: 27 mmol/L (ref 22–32)
Calcium: 8.9 mg/dL (ref 8.9–10.3)
Chloride: 89 mmol/L — ABNORMAL LOW (ref 98–111)
Creatinine, Ser: 0.82 mg/dL (ref 0.44–1.00)
GFR calc Af Amer: >60 mL/min (ref >60)
GFR calc non Af Amer: >60 mL/min (ref >60)
Glucose, Bld: 150 mg/dL — ABNORMAL HIGH (ref 70–99)
Potassium: 3.2 mmol/L — ABNORMAL LOW (ref 3.5–5.1)
Sodium: 136 mmol/L (ref 135–145)

## 2019-03-08 LAB — GLUCOSE, CAPILLARY
Glucose-Capillary: 117 mg/dL — ABNORMAL HIGH (ref 70–99)
Glucose-Capillary: 168 mg/dL — ABNORMAL HIGH (ref 70–99)
Glucose-Capillary: 232 mg/dL — ABNORMAL HIGH (ref 70–99)
Glucose-Capillary: 266 mg/dL — ABNORMAL HIGH (ref 70–99)
Glucose-Capillary: 271 mg/dL — ABNORMAL HIGH (ref 70–99)

## 2019-03-08 LAB — TSH: TSH: 3.986 u[IU]/mL (ref 0.350–4.500)

## 2019-03-08 LAB — D-DIMER, QUANTITATIVE: D-Dimer, Quant: 1.43 ug/mL-FEU — ABNORMAL HIGH (ref 0.00–0.50)

## 2019-03-08 LAB — MAGNESIUM: Magnesium: 1.9 mg/dL (ref 1.7–2.4)

## 2019-03-08 MED ORDER — POTASSIUM CHLORIDE CRYS ER 20 MEQ PO TBCR
20.0000 meq | EXTENDED_RELEASE_TABLET | Freq: Two times a day (BID) | ORAL | Status: DC
  Administered 2019-03-08 (×2): 20 meq via ORAL
  Filled 2019-03-08 (×2): qty 1

## 2019-03-08 NOTE — NC FL2 (Signed)
[place] LEVEL OF CARE SCREENING TOOL     IDENTIFICATION  Patient Name: Lindsey Baldwin Birthdate: 27-Jan-1946 Sex: female Admission Date (Current Location): 02/15/2019  [facility] and [place] Number:  Herbalist and Address:  The [place], [place] [address] [place], [place] [address]([place])      Provider Number: [id number]  Attending Physician Name and Address:  Cherene Altes, MD  Relative Name and Phone Number:  Bernerd Limbo    Current Level of Care: Hospital Recommended Level of Care: [place] Prior Approval Number:    Date Approved/Denied:   PASRR Number: [id number]  Discharge Plan: SNF    Current Diagnoses: Patient Active Problem List   Diagnosis Date Noted  . Pneumonia due to COVID-19 virus 02/15/2019  . Acute respiratory failure due to COVID-19 ([place]) 02/15/2019  . DM2 (diabetes mellitus, type 2) ([place]) 02/15/2019  . Essential hypertension 02/15/2019  . Hyperlipemia 02/15/2019  . OSA on CPAP 02/15/2019  . Osteoarthritis of right knee 12/12/2017    Orientation RESPIRATION BLADDER Height & Weight     Self, Time, Situation, Place  O2([place] 6) External catheter Weight: 166 lb 10.7 oz (75.6 kg) Height:  5\' 2"  (157.5 cm)  BEHAVIORAL SYMPTOMS/MOOD NEUROLOGICAL BOWEL NUTRITION STATUS      Continent Diet(Regular)  AMBULATORY STATUS COMMUNICATION OF NEEDS Skin   Limited Assist Verbally Other (Comment)(Eccymosis intervention, Groin, Abdomen, Arm, Barrier cream)                       Personal Care Assistance Level of Assistance  Bathing, Feeding, Dressing Bathing Assistance: Limited assistance Feeding assistance: Independent Dressing Assistance: Limited assistance     Functional Limitations Info  Sight(Right eye impairment) Sight Info: Impaired(R. eye) Hearing Info: Adequate Speech Info: Adequate    SPECIAL CARE FACTORS FREQUENCY  PT (By licensed PT), OT (By  licensed OT)     PT Frequency: 5x week OT Frequency: 5x week            Contractures Contractures Info: Not present    Additional Factors Info  Code Status, Allergies, Isolation Precautions, Insulin Sliding Scale Code Status Info: Full Allergies Info: Penicillins, Sulfa Antibiotics   Insulin Sliding Scale Info: Insulin Aspart (Novolog) 0-15 units, 3x day. Insulin Glargine (Lantus) 16 units daily. Isolation Precautions Info: Covid Positive     Current Medications (03/08/2019):  This is the current hospital active medication list Current Facility-Administered Medications  Medication Dose Route Frequency Provider Last Rate Last Dose  . acetaminophen (TYLENOL) tablet 650 mg  650 mg Oral Q6H PRN Toy Baker, MD   650 mg at 02/27/19 1644  . apraclonidine (IOPIDINE) 0.5 % ophthalmic solution 1 drop  1 drop Right Eye BID Toy Baker, MD   1 drop at 03/08/19 0856  . aspirin chewable tablet 81 mg  81 mg Oral BID Toy Baker, MD   81 mg at 03/08/19 0859  . atorvastatin (LIPITOR) tablet 40 mg  40 mg Oral QPM Doutova, Anastassia, MD   40 mg at 03/07/19 1816  . bisacodyl (DULCOLAX) EC tablet 10 mg  10 mg Oral Daily PRN Arrien, Jimmy Picket, MD   10 mg at 02/28/19 1718  . chlorpheniramine-HYDROcodone (TUSSIONEX) 10-8 MG/5ML suspension 5 mL  5 mL Oral Q12H PRN Cherene Altes, MD   5 mL at 03/04/19 2216  . enoxaparin (LOVENOX) injection 40 mg  40 mg Subcutaneous Q12H Arrien, Jimmy Picket, MD   40 mg at  03/08/19 1316  . famotidine (PEPCID) tablet 20 mg  20 mg Oral Daily Amin, Ankit Chirag, MD   20 mg at 03/08/19 0858  . furosemide (LASIX) injection 40 mg  40 mg Intravenous Q12H Cherene Altes, MD   40 mg at 03/08/19 0859  . guaiFENesin-dextromethorphan (ROBITUSSIN DM) 100-10 MG/5ML syrup 10 mL  10 mL Oral Q4H PRN Toy Baker, MD   10 mL at 03/05/19 2213  . insulin aspart (novoLOG) injection 0-15 Units  0-15 Units Subcutaneous TID WC Arrien, Jimmy Picket, MD   8 Units at 03/08/19 1315  . insulin glargine (LANTUS) injection 16 Units  16 Units Subcutaneous Daily Cherene Altes, MD   16 Units at 03/08/19 0857  . Ipratropium-Albuterol (COMBIVENT) respimat 1 puff  1 puff Inhalation Q6H PRN Cherene Altes, MD   1 puff at 03/08/19 0857  . latanoprost (XALATAN) 0.005 % ophthalmic solution 1 drop  1 drop Right Eye QHS Toy Baker, MD   1 drop at 03/07/19 2244  . linagliptin (TRADJENTA) tablet 5 mg  5 mg Oral Daily Cherene Altes, MD   5 mg at 03/08/19 0858  . MEDLINE mouth rinse  15 mL Mouth Rinse BID Arrien, Jimmy Picket, MD   15 mL at 03/08/19 0904  . metoprolol tartrate (LOPRESSOR) tablet 12.5 mg  12.5 mg Oral BID Joette Catching T, MD   12.5 mg at 03/08/19 0858  . mirabegron ER (MYRBETRIQ) tablet 50 mg  50 mg Oral Daily Doutova, Anastassia, MD   50 mg at 03/08/19 0859  . ondansetron (ZOFRAN) tablet 4 mg  4 mg Oral Q6H PRN Doutova, Anastassia, MD       Or  . ondansetron (ZOFRAN) injection 4 mg  4 mg Intravenous Q6H PRN Toy Baker, MD   4 mg at 02/19/19 0801  . polyethylene glycol (MIRALAX / GLYCOLAX) packet 17 g  17 g Oral BID Tawni Millers, MD   17 g at 03/08/19 [id number]  . potassium chloride SA (KLOR-CON) CR tablet 20 mEq  20 mEq Oral BID Joette Catching T, MD      . sodium chloride flush (NS) 0.9 % injection 3 mL  3 mL Intravenous Q12H Doutova, Anastassia, MD   3 mL at 03/08/19 0857  . tamsulosin (FLOMAX) capsule 0.4 mg  0.4 mg Oral Daily Cherene Altes, MD   0.4 mg at 03/08/19 0859  . timolol (TIMOPTIC) 0.5 % ophthalmic solution 1 drop  1 drop Right Eye BID Toy Baker, MD   1 drop at 03/08/19 0856  . zolpidem (AMBIEN) tablet 5 mg  5 mg Oral QHS PRN Phillips Grout, MD   5 mg at 03/05/19 2330     Discharge Medications: Please see discharge summary for a list of discharge medications.  Relevant Imaging Results:  Relevant Lab Results:   Additional Information SS#  [id number]  Kirstie Peri, Student-Social Work

## 2019-03-08 NOTE — Progress Notes (Signed)
MSW Intern attempted to contact pt to discuss discharge planning. MSW Intern attempted the room phone and the cell phone, a voicemail was left. Unable to do assessment at this time. SW will continue to follow.

## 2019-03-08 NOTE — Progress Notes (Addendum)
Attempt to call Seth Bake [phone]), family friend of patient, unsuccessful.  Update to Plan of Care   [phone] @12 /06/20: Family friend, Seth Bake, returns call. Informed that patient had no changes in condition. Informed that patient sat up in bedside chair for majority of shift; patient went to bed early AM.

## 2019-03-08 NOTE — Progress Notes (Signed)
Lindsey Baldwin  [id number] DOB: December 13, 1945 DOA: 02/15/2019 PCP: Donald Prose, MD    Brief Narrative:  73 year old with a history of DM 2, HTN, OSA, and GERD who returned to the ED 11/13 with progressive worsening of dyspnea.  At that time she was stable enough to return to her ALF.  She returned to the ED again 11/15 and was found to be hypoxic with bilateral infiltrates on a CXR and admitted to [facility].  Significant Events: 11/10 ED evaluation 11/13 ED evaluation 11/15 admit to [facility] via ED  COVID-19 specific Treatment: Decadron 11/15 > 11/25 Remdesivir 11/15 >11/19 Convalescent plasma 11/16 Actemra 11/17  Subjective: No new complaints today.  Resting comfortably sitting up at side of bed.  Assessment & Plan:  Covid pneumonia - acute hypoxic respiratory failure Has completed a course of remdesivir and steroid - no PE via CTa - protracted recovery but making progress - encouraged use of incentive spirometer and frequent mobilization - cont to gently diurese as blood pressure allows (presently net +8.8L for the admit)  Superimposed left upper lobe bacterial pneumonia Has completed a course of ceftriaxone - no evidence of persisting infection at this time / afebrile   Sinus Tachycardia  TSH is normal - no PE via CTa 11/22 - B LE venous duplex pending   DM2 uncontrolled with hyperglycemia Most recent A1c 6.9 - CBG within target range at this time  HTN Mild hypotension encountered 12/4 - stopped ACE inhibitor -continue diuresis as blood pressure will allow  Hypokalemia Supplement and follow -due to diuretic use -follow-up magnesium  HLD Continue usual statin dose  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   Weight as of this encounter: 75.6 kg.   DVT prophylaxis:  Lovenox Code Status: FULL CODE Family Communication:  Disposition Plan: needs a SNF for rehab   Consultants:  none  Antimicrobials:  Rocephin 1119 > 11/26  Objective: Blood pressure 111/77, pulse (!) 106, temperature 97.6 F (36.4 C), temperature source Axillary, resp. rate (!) 28, height 5\' 2"  (1.575 m), weight 75.6 kg, SpO2 96 %.  Intake/Output Summary (Last 24 hours) at 03/08/2019 0856 Last data filed at 03/07/2019 2244 Gross per 24 hour  Intake 3 ml  Output -  Net 3 ml   Filed Weights   02/15/19 2300  Weight: 75.6 kg    Examination: General: No acute respiratory distress Lungs: Scattered diffuse crackles   Cardiovascular: RRR  Abdomen: benign Extremities: trace B LE  CBC: Recent Labs  Lab 03/06/19 0151  WBC 19.2*  HGB 13.9  HCT 41.6  MCV 89.8  PLT 99991111   Basic Metabolic Panel: Recent Labs  Lab 03/05/19 0500 03/06/19 0151 03/08/19 0131  NA 135 138 136  K 4.0 3.5 3.2*  CL 99 96* 89*  CO2 22 29 27   GLUCOSE 113* 130* 150*  BUN 14 18 25*  CREATININE 0.63 0.70 0.82  CALCIUM 8.1* 8.6* 8.9  MG  --   --  1.9   GFR: Estimated Creatinine Clearance: 59.9 mL/min (by C-G formula based on SCr of 0.82 mg/dL).  Liver Function Tests: Recent Labs  Lab 03/05/19 0500  AST 39  ALT 57*  ALKPHOS 73  BILITOT 0.9  PROT 5.1*  ALBUMIN 2.9*    HbA1C: Hgb A1c MFr Bld  Date/Time Value Ref Range  Status  06/12/2018 02:17 PM 6.9 (H) 4.8 - 5.6 % Final    Comment:    (NOTE) Pre diabetes:          5.7%-6.4% Diabetes:              >6.4% Glycemic control for   <7.0% adults with diabetes   12/04/2017 02:15 PM 7.2 (H) 4.8 - 5.6 % Final    Comment:    (NOTE) Pre diabetes:          5.7%-6.4% Diabetes:              >6.4% Glycemic control for   <7.0% adults with diabetes     CBG: Recent Labs  Lab 03/07/19 0841 03/07/19 1122 03/07/19 1541 03/07/19 2118 03/08/19 0016  GLUCAP 178* 177* 164* 196* 232*     Scheduled Meds: . apraclonidine  1 drop Right Eye  BID  . aspirin  81 mg Oral BID  . atorvastatin  40 mg Oral QPM  . enoxaparin (LOVENOX) injection  40 mg Subcutaneous Q12H  . famotidine  20 mg Oral Daily  . furosemide  40 mg Intravenous Q12H  . insulin aspart  0-15 Units Subcutaneous TID WC  . insulin glargine  16 Units Subcutaneous Daily  . latanoprost  1 drop Right Eye QHS  . linagliptin  5 mg Oral Daily  . mouth rinse  15 mL Mouth Rinse BID  . metoprolol tartrate  12.5 mg Oral BID  . mirabegron ER  50 mg Oral Daily  . polyethylene glycol  17 g Oral BID  . sodium chloride flush  3 mL Intravenous Q12H  . tamsulosin  0.4 mg Oral Daily  . timolol  1 drop Right Eye BID     LOS: 21 days   Cherene Altes, MD [facility] Office  [phone] Pager - Text Page per Amion  If 7PM-7AM, please contact night-coverage per Amion 03/08/2019, 8:56 AM

## 2019-03-08 NOTE — Progress Notes (Signed)
Bilateral lower extremity venous duplex has been completed. Preliminary results can be found in CV Proc through chart review.   03/08/19 2:27 PM Carlos Levering RVT

## 2019-03-08 NOTE — TOC Initial Note (Signed)
Transition of Care [facility]) - Initial/Assessment Note    Patient Details  Name: Lindsey Baldwin MRN: [id number] Date of Birth: Mar 05, 1946  [facility] of [facility]) CM/SW Contact:    Kirstie Peri, [place] Work Phone Number: 03/08/2019, 3:40 PM  Clinical Narrative:                 MSW Intern spoke with pt about discharge planning. Patient is agreeable to SNF. She states her main goal is to return to her ALF. MSW Intern advised her that SW would follow up with placement options. Assessment and FL2 completed, PT faxed out. Expected Discharge Plan: [place] Barriers to Discharge: Continued Medical Work up   Patient Goals and CMS Choice Patient states their goals for this hospitalization and ongoing recovery are:: Patient states her goal is to go to rehab and get stronger so she can go back to [facility]. CMS Medicare.gov Compare Post Acute Care list provided to:: Patient Choice offered to / list presented to : Patient  Expected Discharge Plan and Services Expected Discharge Plan: [place] arrangements for the past 2 months: [place]                                      Prior Living Arrangements/Services Living arrangements for the past 2 months: [employer] Lives with:: Facility Resident Patient language and need for interpreter reviewed:: Yes Do you feel safe going back to the place where you live?: Yes      Need for Family Participation in Patient Care: No (Comment) Care giver support system in place?: Yes (comment)   Criminal Activity/Legal Involvement Pertinent to Current Situation/Hospitalization: No - Comment as needed  Activities of Daily Living Home Assistive Devices/Equipment: Cane (specify quad or straight), CBG Meter, Eyeglasses ADL Screening (condition at time of admission) Patient's cognitive ability adequate to safely complete daily activities?: Yes Is the patient  deaf or have difficulty hearing?: No Does the patient have difficulty seeing, even when wearing glasses/contacts?: Yes Does the patient have difficulty concentrating, remembering, or making decisions?: No Patient able to express need for assistance with ADLs?: Yes Does the patient have difficulty dressing or bathing?: No Independently performs ADLs?: Yes (appropriate for developmental age) Does the patient have difficulty walking or climbing stairs?: Yes Weakness of Legs: Both Weakness of Arms/Hands: None  Permission Sought/Granted Permission sought to share information with : Facility Art therapist granted to share information with : Yes, Verbal Permission Granted     Permission granted to share info w AGENCY: SNF        Emotional Assessment Appearance:: Other (Comment Required(Unable to Assess) Attitude/Demeanor/Rapport: Unable to Assess Affect (typically observed): Unable to Assess Orientation: : Oriented to Self, Oriented to Place, Oriented to  Time, Oriented to Situation Alcohol / Substance Use: Not Applicable Psych Involvement: No (comment)  Admission diagnosis:  Hypoxia [R09.02] Acute respiratory disease due to COVID-19 virus [U07.1, J06.9] Patient Active Problem List   Diagnosis Date Noted  . Pneumonia due to COVID-19 virus 02/15/2019  . Acute respiratory failure due to COVID-19 ([place]) 02/15/2019  . DM2 (diabetes mellitus, type 2) ([place]) 02/15/2019  . Essential hypertension 02/15/2019  . Hyperlipemia 02/15/2019  . OSA on CPAP 02/15/2019  . Osteoarthritis of right knee 12/12/2017   PCP:  Donald Prose, MD Pharmacy:   [place], [place] -  [place] [address] Phone: [phone] Fax: [place], [place] - [address] [place] [address] Phone: [phone] Fax:  [place] [id number] [place], [place] - [place] [place] [address] Phone: [phone] Fax: [phone]  [id number] - [place], [place]. AT [address] [place]. [place] [address] Phone: [phone] Fax: [phone]     Social Determinants of Health (SDOH) Interventions    Readmission Risk Interventions No flowsheet data found.

## 2019-03-09 LAB — BASIC METABOLIC PANEL
Anion gap: 12 (ref 5–15)
BUN: 27 mg/dL — ABNORMAL HIGH (ref 8–23)
CO2: 30 mmol/L (ref 22–32)
Calcium: 8.3 mg/dL — ABNORMAL LOW (ref 8.9–10.3)
Chloride: 90 mmol/L — ABNORMAL LOW (ref 98–111)
Creatinine, Ser: 0.83 mg/dL (ref 0.44–1.00)
GFR calc Af Amer: >60 mL/min (ref >60)
GFR calc non Af Amer: >60 mL/min (ref >60)
Glucose, Bld: 133 mg/dL — ABNORMAL HIGH (ref 70–99)
Potassium: 3.4 mmol/L — ABNORMAL LOW (ref 3.5–5.1)
Sodium: 132 mmol/L — ABNORMAL LOW (ref 135–145)

## 2019-03-09 LAB — GLUCOSE, CAPILLARY
Glucose-Capillary: 129 mg/dL — ABNORMAL HIGH (ref 70–99)
Glucose-Capillary: 157 mg/dL — ABNORMAL HIGH (ref 70–99)
Glucose-Capillary: 146 mg/dL — ABNORMAL HIGH (ref 70–99)
Glucose-Capillary: 143 mg/dL — ABNORMAL HIGH (ref 70–99)

## 2019-03-09 LAB — MAGNESIUM: Magnesium: 2.1 mg/dL (ref 1.7–2.4)

## 2019-03-09 MED ORDER — FUROSEMIDE 20 MG PO TABS
40.0000 mg | ORAL_TABLET | Freq: Two times a day (BID) | ORAL | Status: DC
  Administered 2019-03-09 – 2019-03-13 (×9): 40 mg via ORAL
  Filled 2019-03-09 (×9): qty 2

## 2019-03-09 MED ORDER — POTASSIUM CHLORIDE CRYS ER 20 MEQ PO TBCR
40.0000 meq | EXTENDED_RELEASE_TABLET | Freq: Three times a day (TID) | ORAL | Status: DC
  Administered 2019-03-09 – 2019-03-13 (×11): 40 meq via ORAL
  Filled 2019-03-09 (×12): qty 2

## 2019-03-09 MED ORDER — POTASSIUM CHLORIDE CRYS ER 20 MEQ PO TBCR
40.0000 meq | EXTENDED_RELEASE_TABLET | Freq: Two times a day (BID) | ORAL | Status: DC
  Administered 2019-03-09: 40 meq via ORAL
  Filled 2019-03-09: qty 2

## 2019-03-09 NOTE — Progress Notes (Signed)
Spoke with staff nurse from IV therapy team. Explained that patient need for IV therapy second to IV Lasix regimen. Also disclosed staff nurse concern related to patient on telemetry, full code and no IV acces. IV therapy team nurses validates staff nurse concern. Explained that IV therapy is not available at this time, and patient will be seen on rounds in AM. Questions asked related to potential discharge in AM. Explained that no documentation states that patient will be discharged in  AM. Further explained that patient remains on high flow oxygen at this time so AM discharge is not likely. Explained that suggestion will be made to on call provider will be made related to switched patient from IV Lasix to PO Lasix.

## 2019-03-09 NOTE — Progress Notes (Signed)
Lindsey Baldwin  [id number] DOB: 10-Dec-1945 DOA: 02/15/2019 PCP: Donald Prose, MD    Brief Narrative:  73 year old with a history of DM 2, HTN, OSA, and GERD who returned to the ED 11/13 with progressive worsening of dyspnea.  At that time she was stable enough to return to her ALF.  She returned to the ED again 11/15 and was found to be hypoxic with bilateral infiltrates on a CXR and admitted to [facility].  Significant Events: 11/10 ED evaluation 11/13 ED evaluation 11/15 admit to [facility] via ED 12/6 venous duplex B LE negative for DVT   COVID-19 specific Treatment: Decadron 11/15 > 11/25 Remdesivir 11/15 >11/19 Convalescent plasma 11/16 Actemra 11/17  Subjective: Resting comfortably in bed in good spirits.  Denies chest pain abdominal pain nausea or vomiting.  Assessment & Plan:  Covid pneumonia - acute hypoxic respiratory failure Has completed a course of remdesivir and steroid - no PE via CTa - protracted recovery but making progress - encouraged use of incentive spirometer and frequent mobilization - cont to gently diurese as blood pressure allows (presently net +9.3L for the admit)  Superimposed left upper lobe bacterial pneumonia Has completed a course of ceftriaxone - no evidence of persisting infection at this time / afebrile   Sinus Tachycardia  TSH is normal - no PE via CTa 11/22 - B LE venous duplex negative for DVT  DM2 uncontrolled with hyperglycemia Most recent A1c 6.9 - CBG within target range at this time  HTN Mild hypotension encountered 12/4 - stopped ACE inhibitor -continue diuresis as blood pressure will allow -blood pressure stable presently  Hypokalemia Supplement and follow -due to diuretic use  -magnesium normal  HLD Continue usual statin dose  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   Weight as of this encounter: 75.6 kg.   DVT prophylaxis: Lovenox Code Status: FULL CODE Family Communication:  Disposition Plan: needs a SNF for rehab   Consultants:  none  Antimicrobials:  Rocephin 1119 > 11/26  Objective: Blood pressure 107/63, pulse 99, temperature (!) 97.4 F (36.3 C), temperature source Oral, resp. rate 20, height 5\' 2"  (1.575 m), weight 75.6 kg, SpO2 98 %.  Intake/Output Summary (Last 24 hours) at 03/09/2019 [id number] Last data filed at 03/08/2019 1900 Gross per 24 hour  Intake -  Output 200 ml  Net -200 ml   Filed Weights   02/15/19 2300  Weight: 75.6 kg    Examination: General: No acute respiratory distress Lungs: Scattered diffuse crackles w/o whezing  Cardiovascular: RRR - no M  Abdomen: bs+, no mass Extremities: trace B LE w/o change   CBC: Recent Labs  Lab 03/06/19 0151  WBC 19.2*  HGB 13.9  HCT 41.6  MCV 89.8  PLT 99991111   Basic Metabolic Panel: Recent Labs  Lab 03/06/19 0151 03/08/19 0131 03/09/19 0230  NA 138 136 132*  K 3.5 3.2* 3.4*  CL 96* 89* 90*  CO2 29 27 30   GLUCOSE 130* 150* 133*  BUN 18 25* 27*  CREATININE 0.70 0.82 0.83  CALCIUM 8.6* 8.9 8.3*  MG  --  1.9 2.1   GFR: Estimated Creatinine Clearance: 59.2 mL/min (by C-G formula based on SCr of 0.83 mg/dL).  Liver Function Tests: Recent Labs  Lab 03/05/19 0500  AST 39  ALT 57*  ALKPHOS 73  BILITOT 0.9  PROT 5.1*  ALBUMIN 2.9*    HbA1C: Hgb A1c MFr Bld  Date/Time Value Ref Range Status  06/12/2018 02:17 PM 6.9 (H) 4.8 - 5.6 % Final    Comment:    (NOTE) Pre diabetes:          5.7%-6.4% Diabetes:              >6.4% Glycemic control for   <7.0% adults with diabetes   12/04/2017 02:15 PM 7.2 (H) 4.8 - 5.6 % Final    Comment:    (NOTE) Pre diabetes:          5.7%-6.4% Diabetes:              >6.4% Glycemic control for   <7.0% adults with diabetes     CBG: Recent Labs  Lab 03/08/19 0855 03/08/19 1314  03/08/19 1642 03/08/19 2105 03/09/19 0750  GLUCAP 168* 271* 117* 266* 157*     Scheduled Meds: . apraclonidine  1 drop Right Eye BID  . aspirin  81 mg Oral BID  . atorvastatin  40 mg Oral QPM  . enoxaparin (LOVENOX) injection  40 mg Subcutaneous Q12H  . famotidine  20 mg Oral Daily  . furosemide  40 mg Oral BID  . insulin aspart  0-15 Units Subcutaneous TID WC  . insulin glargine  16 Units Subcutaneous Daily  . latanoprost  1 drop Right Eye QHS  . linagliptin  5 mg Oral Daily  . mouth rinse  15 mL Mouth Rinse BID  . metoprolol tartrate  12.5 mg Oral BID  . mirabegron ER  50 mg Oral Daily  . polyethylene glycol  17 g Oral BID  . potassium chloride  20 mEq Oral BID  . sodium chloride flush  3 mL Intravenous Q12H  . tamsulosin  0.4 mg Oral Daily  . timolol  1 drop Right Eye BID     LOS: 22 days   Cherene Altes, MD [facility] Office  [phone] Pager - Text Page per Amion  If 7PM-7AM, please contact night-coverage per Amion 03/09/2019, 8:22 AM

## 2019-03-09 NOTE — Progress Notes (Signed)
Late entry for 03/08/19 @ 2124. Patient seen and assessed. Patient found sitting in bed watching late PM television. No acute distress noted. Physical assessment completed via computerized charting per [facility] policy. #20 g IV to left AC discontinued after attempt to flush with 10 ml Normal Saline result in swelling at insertion site. IV discontinued with cath tip intact. Cotton gauze dressing and paper tape applied to insertion site. No blood return noted. Side rails up times 2. Bed in lowest position and locked. Call light in reach.

## 2019-03-09 NOTE — Progress Notes (Signed)
On call provider notified of loss of IV access this PM, and scheduled dose of Lasix IV was not administered second to IV access loss. IV therapy consult will be placed in AM.

## 2019-03-10 LAB — GLUCOSE, CAPILLARY
Glucose-Capillary: 143 mg/dL — ABNORMAL HIGH (ref 70–99)
Glucose-Capillary: 162 mg/dL — ABNORMAL HIGH (ref 70–99)
Glucose-Capillary: 169 mg/dL — ABNORMAL HIGH (ref 70–99)
Glucose-Capillary: 248 mg/dL — ABNORMAL HIGH (ref 70–99)
Glucose-Capillary: 121 mg/dL — ABNORMAL HIGH (ref 70–99)

## 2019-03-10 LAB — BASIC METABOLIC PANEL
Anion gap: 14 (ref 5–15)
BUN: 22 mg/dL (ref 8–23)
CO2: 28 mmol/L (ref 22–32)
Calcium: 8.6 mg/dL — ABNORMAL LOW (ref 8.9–10.3)
Chloride: 95 mmol/L — ABNORMAL LOW (ref 98–111)
Creatinine, Ser: 0.74 mg/dL (ref 0.44–1.00)
GFR calc Af Amer: >60 mL/min (ref >60)
GFR calc non Af Amer: >60 mL/min (ref >60)
Glucose, Bld: 136 mg/dL — ABNORMAL HIGH (ref 70–99)
Potassium: 4.3 mmol/L (ref 3.5–5.1)
Sodium: 137 mmol/L (ref 135–145)

## 2019-03-10 NOTE — Progress Notes (Signed)
Physical Therapy Treatment Patient Details Name: Lindsey Baldwin MRN: [id number] DOB: 12-09-1945 Today's Date: 03/10/2019    History of Present Illness 73 y/o female w/ hx of DM II, osteoporosis, OSA on CPAP, OAB. OA. HTN, galucoma, GERD, bronchitis, L blind eye, R knee arthroplasty. Pt tested + for COVID 11/13. evaluated in the emergency department November 10, and then November 13, on her second ED visit she was diagnosed with COVID-19 and discharged to assisted living facility. her sx continue to deteriorate, she developed hypoxemia and persistent fever hence returned to hospital.    PT Comments     The patient had a great day , able to ambulate x 45' on 2 L [place] with Spo2 drop from 95% to 86% with recovery within 1 minute back to 92%. HR 120's when ambulating.Patient monitored on forehead probe(nelcore) The patient is progressing well. Did not require any additional O2 other than 2 L. Continue progressing ambualtion   Follow Up Recommendations  SNF     Equipment Recommendations  None recommended by PT    Recommendations for Other Services       Precautions / Restrictions Precautions Precaution Comments: monitor sats closely,  use flutter    Mobility  Bed Mobility               General bed mobility comments: in recliner  Transfers Overall transfer level: Needs assistance Equipment used: 4-wheeled walker Transfers: Sit to/from Stand Sit to Stand: Min guard         General transfer comment: stood x 2 from recliner without support except rollator  Ambulation/Gait Ambulation/Gait assistance: Min guard Gait Distance (Feet): 20 Feet(then 45') Assistive device: 4-wheeled walker Gait Pattern/deviations: Step-through pattern     General Gait Details: patient guided rollator through room, turned, backed up, on 2 L [place] with lowest SPO2 86%   Stairs             Wheelchair Mobility    Modified Rankin (Stroke Patients Only)       Balance     Sitting  balance-Leahy Scale: Good     Standing balance support: Bilateral upper extremity supported Standing balance-Leahy Scale: Fair                              Cognition Arousal/Alertness: Awake/alert Behavior During Therapy: WFL for tasks assessed/performed Overall Cognitive Status: Within Functional Limits for tasks assessed                                        Exercises      General Comments        Pertinent Vitals/Pain Pain Assessment: No/denies pain    Home Living                      Prior Function            PT Goals (current goals can now be found in the care plan section) Progress towards PT goals: Progressing toward goals    Frequency    Min 2X/week      PT Plan Current plan remains appropriate;Frequency needs to be updated    Co-evaluation              AM-PAC PT "6 Clicks" Mobility   Outcome Measure  Help needed turning from your back to your side while in a  flat bed without using bedrails?: None Help needed moving from lying on your back to sitting on the side of a flat bed without using bedrails?: None Help needed moving to and from a bed to a chair (including a wheelchair)?: A Little Help needed standing up from a chair using your arms (e.g., wheelchair or bedside chair)?: A Little Help needed to walk in hospital room?: A Little Help needed climbing 3-5 steps with a railing? : A Lot 6 Click Score: 19    End of Session Equipment Utilized During Treatment: Oxygen Activity Tolerance: Patient tolerated treatment well Patient left: in chair;with call bell/phone within reach Nurse Communication: Mobility status;Other (comment) PT Visit Diagnosis: Other abnormalities of gait and mobility (R26.89);Muscle weakness (generalized) (M62.81)     Time: [id number] PT Time Calculation (min) (ACUTE ONLY): 39 min  Charges:  $Gait Training: 38-52 mins                     [place] Pager [phone] Office [phone]    Claretha Cooper 03/10/2019, 5:37 PM

## 2019-03-10 NOTE — Progress Notes (Signed)
Lindsey Baldwin  [id number] DOB: 05/20/1945 DOA: 02/15/2019 PCP: Donald Prose, MD    Brief Narrative:  73 year old with a history of DM 2, HTN, OSA, and GERD who returned to the ED 11/13 with progressive worsening of dyspnea.  At that time she was stable enough to return to her ALF.  She returned to the ED again 11/15 and was found to be hypoxic with bilateral infiltrates on a CXR and admitted to [facility].  Significant Events: 11/10 ED evaluation 11/13 ED evaluation 11/15 admit to [facility] via ED 12/6 venous duplex B LE negative for DVT   COVID-19 specific Treatment: Decadron 11/15 > 11/25 Remdesivir 11/15 >11/19 Convalescent plasma 11/16 Actemra 11/17  Subjective: Has been successfully weaned to conventional nasal cannula oxygen.  In good spirits.  Feels that she is improving rapidly at this point.  Denies chest pain nausea or vomiting.  Assessment & Plan:  Covid pneumonia - acute hypoxic respiratory failure Has completed a course of remdesivir and steroid - no PE via CTa - protracted recovery but making progress - encouraged use of incentive spirometer and frequent mobilization - cont to gently diurese as blood pressure allows (presently net +9.4L for the admit)  Superimposed left upper lobe bacterial pneumonia Has completed a course of ceftriaxone - no evidence of persisting infection at this time / afebrile   Sinus Tachycardia  TSH is normal - no PE via CTa 11/22 - B LE venous duplex negative for DVT -tachycardia appears to have resolved for now - likely related to deconditioning and hypoxia   DM2 uncontrolled with hyperglycemia Most recent A1c 6.9 - CBG within target range   HTN Mild hypotension encountered 12/4 - stopped ACE inhibitor -continue diuresis as blood pressure will allow - blood pressure stable presently  Hypokalemia Supplemented to goal of 4.0 -follow intermittently with ongoing diuretic  HLD Continue usual statin dose  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   Weight as of this encounter: 75.6 kg.   DVT prophylaxis: Lovenox Code Status: FULL CODE Family Communication:  Disposition Plan: needs a SNF for rehab - sats improving to point she can now be discharged as soon as a SNF rehab bed is obtained   Consultants:  none  Antimicrobials:  Rocephin 1119 > 11/26  Objective: Blood pressure 100/89, pulse 88, temperature 97.7 F (36.5 C), temperature source Oral, resp. rate 20, height 5\' 2"  (1.575 m), weight 75.6 kg, SpO2 100 %.  Intake/Output Summary (Last 24 hours) at 03/10/2019 0836 Last data filed at 03/09/2019 2107 Gross per 24 hour  Intake 1090 ml  Output 300 ml  Net 790 ml   Filed Weights   02/15/19 2300  Weight: 75.6 kg    Examination: General: No acute respiratory distress - alert  Lungs: Scattered diffuse crackles which are less prominent  Cardiovascular: RRR w/o M  Abdomen: BS+, no mass Extremities: trace B LE which is stable   CBC: Recent Labs  Lab 03/06/19 0151  WBC 19.2*  HGB 13.9  HCT 41.6  MCV 89.8  PLT 99991111   Basic Metabolic Panel: Recent Labs  Lab 03/08/19 0131 03/09/19 0230 03/10/19 0245  NA 136 132* 137  K 3.2* 3.4* 4.3  CL 89* 90* 95*  CO2 27 30 28   GLUCOSE 150* 133* 136*  BUN 25* 27*  22  CREATININE 0.82 0.83 0.74  CALCIUM 8.9 8.3* 8.6*  MG 1.9 2.1  --    GFR: Estimated Creatinine Clearance: 61.4 mL/min (by C-G formula based on SCr of 0.74 mg/dL).  Liver Function Tests: Recent Labs  Lab 03/05/19 0500  AST 39  ALT 57*  ALKPHOS 73  BILITOT 0.9  PROT 5.1*  ALBUMIN 2.9*    HbA1C: Hgb A1c MFr Bld  Date/Time Value Ref Range Status  06/12/2018 02:17 PM 6.9 (H) 4.8 - 5.6 % Final    Comment:    (NOTE) Pre diabetes:          5.7%-6.4% Diabetes:              >6.4% Glycemic  control for   <7.0% adults with diabetes   12/04/2017 02:15 PM 7.2 (H) 4.8 - 5.6 % Final    Comment:    (NOTE) Pre diabetes:          5.7%-6.4% Diabetes:              >6.4% Glycemic control for   <7.0% adults with diabetes     CBG: Recent Labs  Lab 03/09/19 0750 03/09/19 1204 03/09/19 1656 03/10/19 0057 03/10/19 0745  GLUCAP 157* 146* 143* 143* 162*     Scheduled Meds: . apraclonidine  1 drop Right Eye BID  . aspirin  81 mg Oral BID  . atorvastatin  40 mg Oral QPM  . enoxaparin (LOVENOX) injection  40 mg Subcutaneous Q12H  . famotidine  20 mg Oral Daily  . furosemide  40 mg Oral BID  . insulin aspart  0-15 Units Subcutaneous TID WC  . insulin glargine  16 Units Subcutaneous Daily  . latanoprost  1 drop Right Eye QHS  . linagliptin  5 mg Oral Daily  . mouth rinse  15 mL Mouth Rinse BID  . metoprolol tartrate  12.5 mg Oral BID  . mirabegron ER  50 mg Oral Daily  . polyethylene glycol  17 g Oral BID  . potassium chloride  40 mEq Oral TID  . sodium chloride flush  3 mL Intravenous Q12H  . tamsulosin  0.4 mg Oral Daily  . timolol  1 drop Right Eye BID     LOS: 23 days   Cherene Altes, MD [facility] Office  [phone] Pager - Text Page per Amion  If 7PM-7AM, please contact night-coverage per Amion 03/10/2019, 8:36 AM

## 2019-03-10 NOTE — Progress Notes (Signed)
Pt friend Seth Bake updated and all questions were answered.

## 2019-03-10 NOTE — Progress Notes (Signed)
Attempted to call pt's friend Seth Bake for updates. Seth Bake didn't answer, will try again later.

## 2019-03-11 LAB — GLUCOSE, CAPILLARY
Glucose-Capillary: 138 mg/dL — ABNORMAL HIGH (ref 70–99)
Glucose-Capillary: 158 mg/dL — ABNORMAL HIGH (ref 70–99)
Glucose-Capillary: 167 mg/dL — ABNORMAL HIGH (ref 70–99)
Glucose-Capillary: 129 mg/dL — ABNORMAL HIGH (ref 70–99)

## 2019-03-11 NOTE — Plan of Care (Signed)
  Problem: Respiratory: Goal: Complications related to the disease process, condition or treatment will be avoided or minimized Outcome: Progressing   Problem: Respiratory: Goal: Will maintain a patent airway Outcome: Progressing   Problem: Coping: Goal: Psychosocial and spiritual needs will be supported Outcome: Progressing   Problem: Education: Goal: Knowledge of risk factors and measures for prevention of condition will improve Outcome: Progressing   Problem: Skin Integrity: Goal: Risk for impaired skin integrity will decrease Outcome: Progressing   Problem: Safety: Goal: Ability to remain free from injury will improve Outcome: Progressing   Problem: Pain Managment: Goal: General experience of comfort will improve Outcome: Progressing   Problem: Elimination: Goal: Will not experience complications related to urinary retention Outcome: Progressing   Problem: Elimination: Goal: Will not experience complications related to bowel motility Outcome: Progressing   Problem: Coping: Goal: Level of anxiety will decrease Outcome: Progressing   Problem: Nutrition: Goal: Adequate nutrition will be maintained Outcome: Progressing   Problem: Activity: Goal: Risk for activity intolerance will decrease Outcome: Progressing   Problem: Clinical Measurements: Goal: Cardiovascular complication will be avoided Outcome: Progressing   Problem: Clinical Measurements: Goal: Respiratory complications will improve Outcome: Progressing   Problem: Clinical Measurements: Goal: Diagnostic test results will improve Outcome: Progressing   Problem: Clinical Measurements: Goal: Will remain free from infection Outcome: Progressing   Problem: Clinical Measurements: Goal: Ability to maintain clinical measurements within normal limits will improve Outcome: Progressing   Problem: Health Behavior/Discharge Planning: Goal: Ability to manage health-related needs will improve Outcome:  Progressing   Problem: Education: Goal: Knowledge of General Education information will improve Description: Including pain rating scale, medication(s)/side effects and non-pharmacologic comfort measures Outcome: Progressing

## 2019-03-11 NOTE — Progress Notes (Signed)
PROGRESS NOTE                                                                                                                                                                                                             Patient Demographics:    Lindsey Baldwin, is a 73 y.o. female, DOB - 09-24-1945, [id number]  Outpatient Primary MD for the patient is Donald Prose, MD   Admit date - 02/15/2019   LOS - 61  Chief Complaint  Patient presents with   covid positive   Shortness of Breath       Brief Narrative:  73 year old with a history of DM 2, HTN, OSA, and GERD who returned to the ED 11/13 with progressive worsening of dyspnea.  At that time she was stable enough to return to her ALF.  She returned to the ED again 11/15 and was found to be hypoxic with bilateral infiltrates on a CXR and admitted to [facility].  Significant Events: 11/10 ED evaluation 11/13 ED evaluation 11/15 admit to [facility] via ED 12/6 venous duplex B LE negative for DVT   COVID-19 specific Treatment: Decadron 11/15 > 11/25 Remdesivir 11/15 >11/19 Convalescent plasma 11/16 Actemra 11/17   Subjective:    [name] today feels much better-she was smiling when I walked in.  She was just on 1 L of oxygen.   Assessment  & Plan :   Acute Hypoxic Resp Failure due to Covid 19 Viral pneumonia and superimposed bacterial pneumonia: Much improved-prolonged hospital course.  Has completed a course of steroids/remdesivir/Rocephin.  Continue supportive care  Fever: afebrile  O2 requirements:  SpO2: 96 % O2 Flow Rate (L/min): 1 L/min FiO2 (%): 100 %   COVID-19 Labs: No results for input(s): DDIMER, FERRITIN, LDH, CRP in the last 72 hours.     Component Value Date/Time   BNP 134.8 (H) 02/17/2019 0105    No results for input(s): PROCALCITON in the last 168 hours.  Lab Results  Component Value Date   [place] (A)  02/13/2019   [place] Not Detected 01/31/2019     Other medications: Diuretics:Euvolemic-on oral Lasix to maintain negative balance Antibiotics: Completed Rocephin 11/19>> 11/26 for superimposed bacterial pneumonia CBG's stable on SSI while on steroid (if no hx of DM)  Prone/Incentive Spirometry: encouraged incentive spirometry use 3-4/hour.  DVT Prophylaxis  :  Lovenox   Sinus tachycardia: Resolved-TSH within normal limits-CTA/lower extremity Doppler negative for VTE.  DM-2: CBG stable with Lantus 16 units daily and SSI  CBG (last 3)  Recent Labs    03/10/19 2031 03/11/19 0746 03/11/19 1123  GLUCAP 121* 138* 158*   HTN: BP stable with Lasix, metoprolol  HLD: Continue statin  Obesity: Estimated body mass index is 30.48 kg/m as calculated from the following:   Height as of this encounter: 5\' 2"  (1.575 m).   Weight as of this encounter: 75.6 kg.   Deconditioning/debility: Secondary to acute illness-SNF on discharge.  Consults  :  None  Procedures  :  None  ABG:    Component Value Date/Time   PHART 7.407 02/24/2019 1412   PCO2ART 38.0 02/24/2019 1412   PO2ART 47.0 (L) 02/24/2019 1412   HCO3 24.0 02/24/2019 1412   TCO2 25 02/24/2019 1412   ACIDBASEDEF 1.0 02/24/2019 1412   O2SAT 84.0 02/24/2019 1412    Vent Settings: N/A  Condition - Stable  Family Communication  :  Laurier Nancy over the phone  Code Status :  Full Code  Diet :  Diet Order            Diet Carb Modified Fluid consistency: Thin; Room service appropriate? Yes  Diet effective now               Disposition Plan  :  Remain hospitalized-SNF on discharge  Barriers to discharge: Hypoxia requiring O2 supplementation  Antimicorbials  :    Anti-infectives (From admission, onward)   Start     Dose/Rate Route Frequency Ordered Stop   02/19/19 1030  cefTRIAXone (ROCEPHIN) 1 g in sodium chloride 0.9 % 100 mL IVPB     1 g 200 mL/hr over 30 Minutes Intravenous Every 24 hours 02/19/19  1016 02/26/19 0941   02/19/19 1000  cefTRIAXone (ROCEPHIN) injection 1 g  Status:  Discontinued     1 g Intramuscular Every 24 hours 02/19/19 0959 02/19/19 1016   02/17/19 1000  remdesivir 100 mg in sodium chloride 0.9 % 250 mL IVPB     100 mg 500 mL/hr over 30 Minutes Intravenous Every 24 hours 02/16/19 0234 02/19/19 1555   02/16/19 2200  remdesivir 100 mg in sodium chloride 0.9 % 250 mL IVPB  Status:  Discontinued     100 mg 500 mL/hr over 30 Minutes Intravenous Every 24 hours 02/15/19 1935 02/16/19 0234   02/16/19 1600  remdesivir 100 mg in sodium chloride 0.9 % 250 mL IVPB     100 mg 500 mL/hr over 30 Minutes Intravenous  Once 02/16/19 0234 02/16/19 1630   02/15/19 2200  remdesivir 200 mg in sodium chloride 0.9 % 250 mL IVPB     200 mg 500 mL/hr over 30 Minutes Intravenous Once 02/15/19 1935 02/15/19 2306      Inpatient Medications  Scheduled Meds:  apraclonidine  1 drop Right Eye BID   aspirin  81 mg Oral BID   atorvastatin  40 mg Oral QPM   enoxaparin (LOVENOX) injection  40 mg Subcutaneous Q12H   famotidine  20 mg Oral Daily   furosemide  40 mg Oral BID   insulin aspart  0-15 Units Subcutaneous TID WC   insulin glargine  16 Units Subcutaneous Daily   latanoprost  1 drop Right Eye QHS   linagliptin  5 mg Oral Daily   mouth rinse  15 mL Mouth Rinse BID   metoprolol tartrate  12.5 mg Oral BID  mirabegron ER  50 mg Oral Daily   polyethylene glycol  17 g Oral BID   potassium chloride  40 mEq Oral TID   sodium chloride flush  3 mL Intravenous Q12H   tamsulosin  0.4 mg Oral Daily   timolol  1 drop Right Eye BID   Continuous Infusions: PRN Meds:.acetaminophen, bisacodyl, chlorpheniramine-HYDROcodone, guaiFENesin-dextromethorphan, Ipratropium-Albuterol, ondansetron **OR** ondansetron (ZOFRAN) IV, zolpidem   Time Spent in minutes  25  See all Orders from today for further details   Oren Binet M.D on 03/11/2019 at 1:25 PM  To page go to  [email or web address] - use universal password  [facility] -  Office  [phone]    Objective:   Vitals:   03/10/19 2210 03/11/19 0603 03/11/19 0748 03/11/19 1231  BP: 109/65 100/65 109/68   Pulse: (!) 101 (!) 101    Resp:  20 20   Temp:  99.1 F (37.3 C) 98.7 F (37.1 C)   TempSrc:  Axillary Oral   SpO2:   97% 96%  Weight:      Height:        Wt Readings from Last 3 Encounters:  02/15/19 75.6 kg  02/13/19 77.1 kg  06/16/18 73.3 kg     Intake/Output Summary (Last 24 hours) at 03/11/2019 1325 Last data filed at 03/11/2019 1103 Gross per 24 hour  Intake 120 ml  Output 350 ml  Net -230 ml     Physical Exam Gen Exam:Alert awake-not in any distress HEENT:atraumatic, normocephalic Chest: B/L clear to auscultation anteriorly CVS:S1S2 regular Abdomen:soft non tender, non distended Extremities:no edema Neurology: Non focal Skin: no rash   Data Review:    CBC Recent Labs  Lab 03/06/19 0151  WBC 19.2*  HGB 13.9  HCT 41.6  PLT 189  MCV 89.8  MCH 30.0  MCHC 33.4  RDW 13.5    Chemistries  Recent Labs  Lab 03/05/19 0500 03/06/19 0151 03/08/19 0131 03/09/19 0230 03/10/19 0245  NA 135 138 136 132* 137  K 4.0 3.5 3.2* 3.4* 4.3  CL 99 96* 89* 90* 95*  CO2 22 29 27 30 28   GLUCOSE 113* 130* 150* 133* 136*  BUN 14 18 25* 27* 22  CREATININE 0.63 0.70 0.82 0.83 0.74  CALCIUM 8.1* 8.6* 8.9 8.3* 8.6*  MG  --   --  1.9 2.1  --   AST 39  --   --   --   --   ALT 57*  --   --   --   --   ALKPHOS 73  --   --   --   --   BILITOT 0.9  --   --   --   --    ------------------------------------------------------------------------------------------------------------------ No results for input(s): CHOL, HDL, LDLCALC, TRIG, CHOLHDL, LDLDIRECT in the last 72 hours.  Lab Results  Component Value Date   HGBA1C 6.9 (H) 06/12/2018   ------------------------------------------------------------------------------------------------------------------ No results for  input(s): TSH, T4TOTAL, T3FREE, THYROIDAB in the last 72 hours.  Invalid input(s): FREET3 ------------------------------------------------------------------------------------------------------------------ No results for input(s): VITAMINB12, FOLATE, FERRITIN, TIBC, IRON, RETICCTPCT in the last 72 hours.  Coagulation profile No results for input(s): INR, PROTIME in the last 168 hours.  No results for input(s): DDIMER in the last 72 hours.  Cardiac Enzymes No results for input(s): CKMB, TROPONINI, MYOGLOBIN in the last 168 hours.  Invalid input(s): CK ------------------------------------------------------------------------------------------------------------------    Component Value Date/Time   BNP 134.8 (H) 02/17/2019 0105    Micro Results No results found for this or  any previous visit (from the past 240 hour(s)).  Radiology Reports Dg Chest 1 View  Result Date: 02/19/2019 CLINICAL DATA:  Follow-up pneumonia. EXAM: CHEST  1 VIEW COMPARISON:  02/15/2019. FINDINGS: Mild cardiac enlargement. Significant interval increase in bilateral airspace opacities within the mid and lower lung zones. No pleural effusion or edema. Visualized osseous structures are unremarkable. IMPRESSION: 1. Worsening aeration to both lungs compatible with multifocal pneumonia. 2. Stable cardiac enlargement. 3. No new findings noted in the chest. Electronically Signed   By: Kerby Moors M.D.   On: 02/19/2019 10:13   Ct Angio Chest Pe W Or Wo Contrast  Result Date: 02/22/2019 CLINICAL DATA:  Shortness of breath with concern for PE. EXAM: CT ANGIOGRAPHY CHEST WITH CONTRAST TECHNIQUE: Multidetector CT imaging of the chest was performed using the standard protocol during bolus administration of intravenous contrast. Multiplanar CT image reconstructions and MIPs were obtained to evaluate the vascular anatomy. CONTRAST:  38mL OMNIPAQUE IOHEXOL 350 MG/ML SOLN COMPARISON:  None. FINDINGS: Cardiovascular: Evaluation for  pulmonary emboli is limited by significant respiratory motion artifact. Given this limitation, no PE was identified.The the heart size is relatively normal. There is no significant pericardial effusion. No evidence for and aortic dissection or aneurysm. Mediastinum/Nodes: --No mediastinal or hilar lymphadenopathy. --No axillary lymphadenopathy. --No supraclavicular lymphadenopathy. --Normal thyroid gland. --The esophagus is unremarkable Lungs/Pleura: There are diffuse bilateral ground-glass airspace opacities with more focal areas of consolidation within the right upper and right lower lobes. The trachea is unremarkable. There are trace bilateral pleural effusions. Upper Abdomen: There is hepatic steatosis. Cholelithiasis is noted. Musculoskeletal: No chest wall abnormality. No acute or significant osseous findings. Review of the MIP images confirms the above findings. IMPRESSION: 1. Evaluation for pulmonary emboli is limited by significant respiratory motion artifact. Given this limitation, no PE was identified. 2. Diffuse bilateral ground-glass airspace opacities with more focal areas of consolidation within the right upper and right lower lobes. Findings are consistent with the patient's reported history of viral pneumonia. 3. Trace bilateral pleural effusions. 4. Hepatic steatosis. 5. Cholelithiasis. Electronically Signed   By: Constance Holster M.D.   On: 02/22/2019 17:39   Dg Chest Port 1 View  Result Date: 02/15/2019 CLINICAL DATA:  Short of breath chest pain COVID EXAM: PORTABLE CHEST 1 VIEW COMPARISON:  02/12/2019 FINDINGS: Interim development of patchy airspace disease within the bilateral mid to lower lung zones. No pleural effusion. Stable cardiomediastinal silhouette. No pneumothorax. Age indeterminate suspected fracture deformity at the proximal right humerus. IMPRESSION: 1. Interim development of bilateral airspace disease in the mid to lower lungs compatible with multifocal pneumonia 2. Age  indeterminate deformity of the proximal right humerus. Dedicated right shoulder radiographs may be obtained if clinically warranted. Electronically Signed   By: Donavan Foil M.D.   On: 02/15/2019 16:47   Dg Chest Port 1 View  Result Date: 02/12/2019 CLINICAL DATA:  Shortness of breath, cough and fever EXAM: PORTABLE CHEST 1 VIEW COMPARISON:  July 06, 2017 FINDINGS: The heart size and mediastinal contours are within normal limits. Both lungs are clear. The visualized skeletal structures are unremarkable. IMPRESSION: No active disease. Electronically Signed   By: Prudencio Pair M.D.   On: 02/12/2019 01:41   Vas [place] Lower Extremity Venous (dvt)  Result Date: 03/08/2019  Lower Venous Study Indications: Elevated Ddimer.  Risk Factors: COVID 19 positive. Limitations: Poor ultrasound/tissue interface. Comparison Study: No prior studies. Performing Technologist: Oliver Hum RVT  Examination Guidelines: A complete evaluation includes B-mode imaging, spectral Doppler, color Doppler,  and power Doppler as needed of all accessible portions of each vessel. Bilateral testing is considered an integral part of a complete examination. Limited examinations for reoccurring indications may be performed as noted.  +---------+---------------+---------+-----------+----------+--------------+  RIGHT     Compressibility Phasicity Spontaneity Properties Thrombus Aging  +---------+---------------+---------+-----------+----------+--------------+  CFV       Full            Yes       Yes                                    +---------+---------------+---------+-----------+----------+--------------+  SFJ       Full                                                             +---------+---------------+---------+-----------+----------+--------------+  FV Prox   Full                                                             +---------+---------------+---------+-----------+----------+--------------+  FV Mid    Full                                                              +---------+---------------+---------+-----------+----------+--------------+  FV Distal Full                                                             +---------+---------------+---------+-----------+----------+--------------+  PFV       Full                                                             +---------+---------------+---------+-----------+----------+--------------+  POP       Full            Yes       Yes                                    +---------+---------------+---------+-----------+----------+--------------+  PTV       Full                                                             +---------+---------------+---------+-----------+----------+--------------+  PERO      Full                                                             +---------+---------------+---------+-----------+----------+--------------+   +---------+---------------+---------+-----------+----------+--------------+  LEFT      Compressibility Phasicity Spontaneity Properties Thrombus Aging  +---------+---------------+---------+-----------+----------+--------------+  CFV       Full            Yes       Yes                                    +---------+---------------+---------+-----------+----------+--------------+  SFJ       Full                                                             +---------+---------------+---------+-----------+----------+--------------+  FV Prox   Full                                                             +---------+---------------+---------+-----------+----------+--------------+  FV Mid    Full                                                             +---------+---------------+---------+-----------+----------+--------------+  FV Distal Full                                                             +---------+---------------+---------+-----------+----------+--------------+  PFV       Full                                                              +---------+---------------+---------+-----------+----------+--------------+  POP       Full            Yes       Yes                                    +---------+---------------+---------+-----------+----------+--------------+  PTV       Full                                                             +---------+---------------+---------+-----------+----------+--------------+  PERO      Full                                                             +---------+---------------+---------+-----------+----------+--------------+  Summary: Right: There is no evidence of deep vein thrombosis in the lower extremity. No cystic structure found in the popliteal fossa. Left: There is no evidence of deep vein thrombosis in the lower extremity. No cystic structure found in the popliteal fossa.  *See table(s) above for measurements and observations. Electronically signed by Deitra Mayo MD on 03/08/2019 at 3:52:34 PM.    Final

## 2019-03-11 NOTE — Progress Notes (Signed)
Occupational Therapy Treatment Patient Details Name: Lindsey Baldwin MRN: [id number] DOB: [birth year] Today's Date: 03/11/2019    History of present illness 73 y/o female w/ hx of DM II, osteoporosis, OSA on CPAP, OAB. OA. HTN, galucoma, GERD, bronchitis, L blind eye, R knee arthroplasty. Pt tested + for COVID 11/13. evaluated in the emergency department November 10, and then November 13, on her second ED visit she was diagnosed with COVID-19 and discharged to assisted living facility. her sx continue to deteriorate, she developed hypoxemia and persistent fever hence returned to hospital.   OT comments  All goals reviewed and updated to reflect progress. Pt continues to make progress in therapy, demonstrating increased independence in ADLs and transfers as well as requiring less supplemental oxygen. Pt able to ambulate to/from the bathroom with RW and min guard. Noted 0 instances of LOB throughout. Pt able to complete toileting task as well as stand 1 x 3 min at the sink to complete grooming/hygiene tasks. Pt's O2 SATs maintained in 90s throughout on 2L [place]. 2/4 DOE. Continued education with pt on relaxation strategies and pursed lip breathing techniques to address anxiety related to SOB. Pt weaned down to 1L [place] at end of session with O2 SATs at 97%. RN updated. All questions/concerns answered at this time. Recommend SNF placement for additional rehab prior to discharge home.    Follow Up Recommendations  SNF    Equipment Recommendations  Other (comment)(TBD at next venue of care)    Recommendations for Other Services      Precautions / Restrictions Precautions Precautions: Fall Restrictions Weight Bearing Restrictions: No       Mobility Bed Mobility                  Transfers Overall transfer level: Needs assistance Equipment used: Rolling walker (2 wheeled) Transfers: Sit to/from Stand Sit to Stand: Min guard         General transfer comment: Pt able to ambulate  to/from bathroom and transfer on/off toilet with RW and min guard    Balance Overall balance assessment: Needs assistance   Sitting balance-Leahy Scale: Good       Standing balance-Leahy Scale: Fair                             ADL either performed or assessed with clinical judgement   ADL Overall ADL's : Needs assistance/impaired     Grooming: Supervision/safety;Standing                   Toilet Transfer: Min guard;Regular Toilet;Grab bars;Ambulation   Toileting- Clothing Manipulation and Hygiene: Min guard;Sit to/from stand       Functional mobility during ADLs: Min guard;Rolling walker General ADL Comments: Pt able to ambulate to/from sink with RW and min guard. Noted 0 instances of LOB     Vision       Perception     Praxis      Cognition Arousal/Alertness: Awake/alert Behavior During Therapy: WFL for tasks assessed/performed Overall Cognitive Status: Within Functional Limits for tasks assessed                                          Exercises Exercises: Other exercises Other Exercises Other Exercises: Pursed lip breathing exercises with min cues on technique Other Exercises: Incentive spirometer x 10 with mod cues on  technique. Pulling 562m Other Exercises: Flutter valve x 10 with min cues on technique.   Shoulder Instructions       General Comments Pt's O2 SATs maintained in 90s throughout on 2L [place]. Weaned pt down to 1L [place] at end of session with O2 SATs in the 90s.     Pertinent Vitals/ Pain       Pain Assessment: No/denies pain  Home Living                                          Prior Functioning/Environment              Frequency           Progress Toward Goals  OT Goals(current goals can now be found in the care plan section)  Progress towards OT goals: Goals met and updated - see care plan;Progressing toward goals(Goals reviewed and updated)  Acute Rehab OT  Goals Patient Stated Goal: To go to rehab Time For Goal Achievement: 03/25/19 Potential to Achieve Goals: Good ADL Goals Pt Will Perform Grooming: with modified independence;standing  Plan Discharge plan remains appropriate    Co-evaluation                 AM-PAC OT "6 Clicks" Daily Activity     Outcome Measure   Help from another person eating meals?: None Help from another person taking care of personal grooming?: A Little Help from another person toileting, which includes using toliet, bedpan, or urinal?: A Little Help from another person bathing (including washing, rinsing, drying)?: A Little Help from another person to put on and taking off regular upper body clothing?: A Little Help from another person to put on and taking off regular lower body clothing?: A Little 6 Click Score: 19    End of Session Equipment Utilized During Treatment: Oxygen;Rolling walker  OT Visit Diagnosis: Muscle weakness (generalized) (M62.81);Unsteadiness on feet (R26.81)   Activity Tolerance Patient limited by fatigue(Limited by SOB and anxiety)   Patient Left in chair;with call bell/phone within reach   Nurse Communication Mobility status        Time: 1130-1156 OT Time Calculation (min): 26 min  Charges: OT General Charges $OT Visit: 1 Visit OT Treatments $Self Care/Home Management : 8-22 mins $Therapeutic Activity: 8-22 mins  MMauri BrooklynOTR/L [phone]   MMauri Brooklyn12/12/2018, 2:29 PM

## 2019-03-11 NOTE — Progress Notes (Signed)
Pt on RA. Pt able to maintain sats at 90% or above on RA while ambulating to bathroom. Pt is orthopneic and may eed supplemental O2 while sleeping. Pt using flutter valve with no distress.

## 2019-03-11 NOTE — TOC Progression Note (Addendum)
Transition of Care [facility]) - Progression Note    Patient Details  Name: Lindsey Baldwin MRN: [id number] Date of Birth: Sep 24, 1945  [facility] CM/SW Contact  Ninfa Meeker, RN Phone Number: [phone] (working remotely) 03/11/2019, 1:44 PM  Clinical Narrative:  Case manager spoke with patient via telephone to discuss discharge needs. Patient agreed to being faxed out, prefers [facility], agreed to being faxed to [place]. Case manager will contact liaison at both facilities and request auth initiation.    1:57pm: [facility] Has offered a bed, CM will begin [place]. Bed will not be available until Friday.    Expected Discharge Plan: [place] Barriers to Discharge: Continued Medical Work up  Expected Discharge Plan and Services Expected Discharge Plan: [place] arrangements for the past 2 months: [place]                                       Social Determinants of Health (SDOH) Interventions    Readmission Risk Interventions No flowsheet data found.

## 2019-03-11 NOTE — Progress Notes (Signed)
Pt is somewhat anxious and tearful. Pt reports feeling bad because she had bowel incontinence last night. Reassured pt and gave information on meds and Covid 19. Pt is now sitting up in bed eating breakfast. Pt is NM. No IV access and per night RN, MD is aware.

## 2019-03-12 LAB — GLUCOSE, CAPILLARY
Glucose-Capillary: 154 mg/dL — ABNORMAL HIGH (ref 70–99)
Glucose-Capillary: 160 mg/dL — ABNORMAL HIGH (ref 70–99)
Glucose-Capillary: 178 mg/dL — ABNORMAL HIGH (ref 70–99)
Glucose-Capillary: 116 mg/dL — ABNORMAL HIGH (ref 70–99)

## 2019-03-12 NOTE — Care Management Important Message (Signed)
Important Message  Patient Details  Name: Lindsey Baldwin MRN: [id number] Date of Birth: 08-29-1945   Medicare Important Message Given:  Yes - Important Message mailed due to current National Emergency  Verbal consent obtained due to current National Emergency  Relationship to patient: Friend(POA) Contact Name: Laurena Bering Call Date: 03/12/19  Time: 0911 Phone: ID:[phone] Outcome: Spoke with contact Important Message mailed to: Emergency contact on file    Delorse Lek 03/12/2019, 9:11 AM

## 2019-03-12 NOTE — Progress Notes (Signed)
Physical Therapy Treatment Patient Details Name: Lindsey Baldwin MRN: [id number] DOB: 1945-11-29 Today's Date: 03/12/2019    History of Present Illness 73 y/o female w/ hx of DM II, osteoporosis, OSA on CPAP, OAB. OA. HTN, galucoma, GERD, bronchitis, L blind eye, R knee arthroplasty. Pt tested + for COVID 11/13. evaluated in the emergency department November 10, and then November 13, on her second ED visit she was diagnosed with COVID-19 and discharged to assisted living facility. her sx continue to deteriorate, she developed hypoxemia and persistent fever hence returned to hospital.    PT Comments    The patient is resting on RA at 95%. Placed on 2 L to ambulate 25'x 2, SPO2 87% with activity. Rebounded to 95% on 2 L. Removed to RA, SPo2 93% at rest. Patient is making excellent progress. Plans REHAB at SNF.    Follow Up Recommendations  SNF     Equipment Recommendations  None recommended by PT    Recommendations for Other Services       Precautions / Restrictions Precautions Precautions: Fall Precaution Comments: monitor sats closely,  use flutter    Mobility  Bed Mobility               General bed mobility comments: in recliner  Transfers   Equipment used: Rolling walker (2 wheeled) Transfers: Sit to/from Stand Sit to Stand: Min guard Stand pivot transfers: Min guard       General transfer comment: Pt able to ambulate to/from bathroom and transfer on/off toilet with RW and min guard  Ambulation/Gait Ambulation/Gait assistance: Min guard Gait Distance (Feet): 25 Feet(x 2) Assistive device: Rolling walker (2 wheeled) Gait Pattern/deviations: Step-through pattern     General Gait Details: patient ambualted to BR on 2 L [place] SPO2 87%   Stairs             Wheelchair Mobility    Modified Rankin (Stroke Patients Only)       Balance                                            Cognition Arousal/Alertness: Awake/alert                                            Exercises      General Comments        Pertinent Vitals/Pain Pain Assessment: No/denies pain    Home Living                      Prior Function            PT Goals (current goals can now be found in the care plan section) Progress towards PT goals: Progressing toward goals    Frequency    Min 2X/week      PT Plan Current plan remains appropriate;Frequency needs to be updated    Co-evaluation              AM-PAC PT "6 Clicks" Mobility   Outcome Measure  Help needed turning from your back to your side while in a flat bed without using bedrails?: None Help needed moving from lying on your back to sitting on the side of a flat bed without using bedrails?: None Help needed moving to  and from a bed to a chair (including a wheelchair)?: A Little Help needed standing up from a chair using your arms (e.g., wheelchair or bedside chair)?: A Little Help needed to walk in hospital room?: A Little Help needed climbing 3-5 steps with a railing? : A Lot 6 Click Score: 19    End of Session Equipment Utilized During Treatment: Oxygen Activity Tolerance: Patient tolerated treatment well Patient left: in chair;with call bell/phone within reach Nurse Communication: Mobility status;Other (comment) PT Visit Diagnosis: Other abnormalities of gait and mobility (R26.89);Muscle weakness (generalized) (M62.81)     Time: [id number] PT Time Calculation (min) (ACUTE ONLY): 27 min  Charges:  $Gait Training: 8-22 mins $Self Care/Home Management: [place] Pager [phone] Office [phone]    Claretha Cooper 03/12/2019, 1:42 PM

## 2019-03-12 NOTE — Progress Notes (Signed)
PROGRESS NOTE                                                                                                                                                                                                             Patient Demographics:    Lindsey Baldwin, is a 73 y.o. female, DOB - Jun 28, 1945, [id number]  Outpatient Primary MD for the patient is Donald Prose, MD   Admit date - 02/15/2019   LOS - 45  Chief Complaint  Patient presents with   covid positive   Shortness of Breath       Brief Narrative:  73 year old with a history of DM 2, HTN, OSA, and GERD who returned to the ED 11/13 with progressive worsening of dyspnea.  At that time she was stable enough to return to her ALF.  She returned to the ED again 11/15 and was found to be hypoxic with bilateral infiltrates on a CXR and admitted to [facility].  Significant Events: 11/10 ED evaluation 11/13 ED evaluation 11/15 admit to [facility] via ED 12/6 venous duplex B LE negative for DVT   COVID-19 specific Treatment: Decadron 11/15 > 11/25 Remdesivir 11/15 >11/19 Convalescent plasma 11/16 Actemra 11/17   Subjective:    Lindsey Baldwin was sitting at bedside chair-no complaints.  She seemed very comfortable.  On just 1 L of oxygen.   Assessment  & Plan :   Acute Hypoxic Resp Failure due to Covid 19 Viral pneumonia and superimposed bacterial pneumonia: Much improved-prolonged hospital course.  Has completed a course of steroids/remdesivir/Rocephin.  Continue supportive care  Fever: afebrile  O2 requirements:  SpO2: 96 % O2 Flow Rate (L/min): 1 L/min FiO2 (%): 100 %   COVID-19 Labs: No results for input(s): DDIMER, FERRITIN, LDH, CRP in the last 72 hours.     Component Value Date/Time   BNP 134.8 (H) 02/17/2019 0105    No results for input(s): PROCALCITON in the last 168 hours.  Lab Results  Component Value Date   [place] (A)  02/13/2019   [place] Not Detected 01/31/2019     Other medications: Diuretics:Euvolemic-on oral Lasix to maintain negative balance Antibiotics: Completed Rocephin 11/19>> 11/26 for superimposed bacterial pneumonia CBG's stable on SSI while on steroid (if no hx of DM)  Prone/Incentive Spirometry: encouraged incentive spirometry use 3-4/hour.  DVT Prophylaxis  :  Lovenox   Sinus tachycardia: Resolved-TSH within normal limits-CTA/lower extremity Doppler negative for VTE.  DM-2: CBG stable with Lantus 16 units daily and SSI  CBG (last 3)  Recent Labs    03/11/19 2103 03/12/19 0814 03/12/19 1109  GLUCAP 129* 154* 160*   HTN: BP stable with Lasix, metoprolol  HLD: Continue statin  Obesity: Estimated body mass index is 30.48 kg/m as calculated from the following:   Height as of this encounter: 5\' 2"  (1.575 m).   Weight as of this encounter: 75.6 kg.   Deconditioning/debility: Secondary to acute illness-SNF on discharge.  Consults  :  None  Procedures  :  None  ABG:    Component Value Date/Time   PHART 7.407 02/24/2019 1412   PCO2ART 38.0 02/24/2019 1412   PO2ART 47.0 (L) 02/24/2019 1412   HCO3 24.0 02/24/2019 1412   TCO2 25 02/24/2019 1412   ACIDBASEDEF 1.0 02/24/2019 1412   O2SAT 84.0 02/24/2019 1412    Vent Settings: N/A  Condition - Stable  Family Communication  :  Laurier Nancy over the phone 12/9 Code Status :  Full Code  Diet :  Diet Order            Diet Carb Modified Fluid consistency: Thin; Room service appropriate? Yes  Diet effective now               Disposition Plan  :  Remain hospitalized-SNF on discharge-awaiting SNF bed  Barriers to discharge: Hypoxia requiring O2 supplementation  Antimicorbials  :    Anti-infectives (From admission, onward)   Start     Dose/Rate Route Frequency Ordered Stop   02/19/19 1030  cefTRIAXone (ROCEPHIN) 1 g in sodium chloride 0.9 % 100 mL IVPB     1 g 200 mL/hr over 30 Minutes Intravenous  Every 24 hours 02/19/19 1016 02/26/19 0941   02/19/19 1000  cefTRIAXone (ROCEPHIN) injection 1 g  Status:  Discontinued     1 g Intramuscular Every 24 hours 02/19/19 0959 02/19/19 1016   02/17/19 1000  remdesivir 100 mg in sodium chloride 0.9 % 250 mL IVPB     100 mg 500 mL/hr over 30 Minutes Intravenous Every 24 hours 02/16/19 0234 02/19/19 1555   02/16/19 2200  remdesivir 100 mg in sodium chloride 0.9 % 250 mL IVPB  Status:  Discontinued     100 mg 500 mL/hr over 30 Minutes Intravenous Every 24 hours 02/15/19 1935 02/16/19 0234   02/16/19 1600  remdesivir 100 mg in sodium chloride 0.9 % 250 mL IVPB     100 mg 500 mL/hr over 30 Minutes Intravenous  Once 02/16/19 0234 02/16/19 1630   02/15/19 2200  remdesivir 200 mg in sodium chloride 0.9 % 250 mL IVPB     200 mg 500 mL/hr over 30 Minutes Intravenous Once 02/15/19 1935 02/15/19 2306      Inpatient Medications  Scheduled Meds:  apraclonidine  1 drop Right Eye BID   aspirin  81 mg Oral BID   atorvastatin  40 mg Oral QPM   enoxaparin (LOVENOX) injection  40 mg Subcutaneous Q12H   famotidine  20 mg Oral Daily   furosemide  40 mg Oral BID   insulin aspart  0-15 Units Subcutaneous TID WC   insulin glargine  16 Units Subcutaneous Daily   latanoprost  1 drop Right Eye QHS   linagliptin  5 mg Oral Daily   mouth rinse  15 mL Mouth Rinse BID   metoprolol tartrate  12.5 mg Oral BID  mirabegron ER  50 mg Oral Daily   polyethylene glycol  17 g Oral BID   potassium chloride  40 mEq Oral TID   sodium chloride flush  3 mL Intravenous Q12H   tamsulosin  0.4 mg Oral Daily   timolol  1 drop Right Eye BID   Continuous Infusions: PRN Meds:.acetaminophen, bisacodyl, chlorpheniramine-HYDROcodone, guaiFENesin-dextromethorphan, Ipratropium-Albuterol, ondansetron **OR** ondansetron (ZOFRAN) IV, zolpidem   Time Spent in minutes  15  See all Orders from today for further details   Oren Binet M.D on 03/12/2019 at 1:29  PM  To page go to [email or web address] - use universal password  [facility] -  Office  [phone]    Objective:   Vitals:   03/11/19 2000 03/12/19 0352 03/12/19 0832 03/12/19 0908  BP: 101/70 91/67  96/70  Pulse: (!) 105 87  99  Resp:  18    Temp:  97.9 F (36.6 C)  98.3 F (36.8 C)  TempSrc:  Oral  Oral  SpO2: 98% 94% 91% 96%  Weight:      Height:        Wt Readings from Last 3 Encounters:  02/15/19 75.6 kg  02/13/19 77.1 kg  06/16/18 73.3 kg     Intake/Output Summary (Last 24 hours) at 03/12/2019 1329 Last data filed at 03/12/2019 1300 Gross per 24 hour  Intake 120 ml  Output 450 ml  Net -330 ml     Physical Exam Gen Exam:Alert awake-not in any distress HEENT:atraumatic, normocephalic Chest: B/L clear to auscultation anteriorly CVS:S1S2 regular Abdomen:soft non tender, non distended Extremities:no edema Neurology: Non focal Skin: no rash   Data Review:    CBC Recent Labs  Lab 03/06/19 0151  WBC 19.2*  HGB 13.9  HCT 41.6  PLT 189  MCV 89.8  MCH 30.0  MCHC 33.4  RDW 13.5    Chemistries  Recent Labs  Lab 03/06/19 0151 03/08/19 0131 03/09/19 0230 03/10/19 0245  NA 138 136 132* 137  K 3.5 3.2* 3.4* 4.3  CL 96* 89* 90* 95*  CO2 29 27 30 28   GLUCOSE 130* 150* 133* 136*  BUN 18 25* 27* 22  CREATININE 0.70 0.82 0.83 0.74  CALCIUM 8.6* 8.9 8.3* 8.6*  MG  --  1.9 2.1  --    ------------------------------------------------------------------------------------------------------------------ No results for input(s): CHOL, HDL, LDLCALC, TRIG, CHOLHDL, LDLDIRECT in the last 72 hours.  Lab Results  Component Value Date   HGBA1C 6.9 (H) 06/12/2018   ------------------------------------------------------------------------------------------------------------------ No results for input(s): TSH, T4TOTAL, T3FREE, THYROIDAB in the last 72 hours.  Invalid input(s):  FREET3 ------------------------------------------------------------------------------------------------------------------ No results for input(s): VITAMINB12, FOLATE, FERRITIN, TIBC, IRON, RETICCTPCT in the last 72 hours.  Coagulation profile No results for input(s): INR, PROTIME in the last 168 hours.  No results for input(s): DDIMER in the last 72 hours.  Cardiac Enzymes No results for input(s): CKMB, TROPONINI, MYOGLOBIN in the last 168 hours.  Invalid input(s): CK ------------------------------------------------------------------------------------------------------------------    Component Value Date/Time   BNP 134.8 (H) 02/17/2019 0105    Micro Results No results found for this or any previous visit (from the past 240 hour(s)).  Radiology Reports DG Chest 1 View  Result Date: 02/19/2019 CLINICAL DATA:  Follow-up pneumonia. EXAM: CHEST  1 VIEW COMPARISON:  02/15/2019. FINDINGS: Mild cardiac enlargement. Significant interval increase in bilateral airspace opacities within the mid and lower lung zones. No pleural effusion or edema. Visualized osseous structures are unremarkable. IMPRESSION: 1. Worsening aeration to both lungs compatible with multifocal pneumonia. 2. Stable cardiac  enlargement. 3. No new findings noted in the chest. Electronically Signed   By: Kerby Moors M.D.   On: 02/19/2019 10:13   CT ANGIO CHEST PE W OR WO CONTRAST  Result Date: 02/22/2019 CLINICAL DATA:  Shortness of breath with concern for PE. EXAM: CT ANGIOGRAPHY CHEST WITH CONTRAST TECHNIQUE: Multidetector CT imaging of the chest was performed using the standard protocol during bolus administration of intravenous contrast. Multiplanar CT image reconstructions and MIPs were obtained to evaluate the vascular anatomy. CONTRAST:  28mL OMNIPAQUE IOHEXOL 350 MG/ML SOLN COMPARISON:  None. FINDINGS: Cardiovascular: Evaluation for pulmonary emboli is limited by significant respiratory motion artifact. Given this  limitation, no PE was identified.The the heart size is relatively normal. There is no significant pericardial effusion. No evidence for and aortic dissection or aneurysm. Mediastinum/Nodes: --No mediastinal or hilar lymphadenopathy. --No axillary lymphadenopathy. --No supraclavicular lymphadenopathy. --Normal thyroid gland. --The esophagus is unremarkable Lungs/Pleura: There are diffuse bilateral ground-glass airspace opacities with more focal areas of consolidation within the right upper and right lower lobes. The trachea is unremarkable. There are trace bilateral pleural effusions. Upper Abdomen: There is hepatic steatosis. Cholelithiasis is noted. Musculoskeletal: No chest wall abnormality. No acute or significant osseous findings. Review of the MIP images confirms the above findings. IMPRESSION: 1. Evaluation for pulmonary emboli is limited by significant respiratory motion artifact. Given this limitation, no PE was identified. 2. Diffuse bilateral ground-glass airspace opacities with more focal areas of consolidation within the right upper and right lower lobes. Findings are consistent with the patient's reported history of viral pneumonia. 3. Trace bilateral pleural effusions. 4. Hepatic steatosis. 5. Cholelithiasis. Electronically Signed   By: Constance Holster M.D.   On: 02/22/2019 17:39   DG Chest Port 1 View  Result Date: 02/15/2019 CLINICAL DATA:  Short of breath chest pain COVID EXAM: PORTABLE CHEST 1 VIEW COMPARISON:  02/12/2019 FINDINGS: Interim development of patchy airspace disease within the bilateral mid to lower lung zones. No pleural effusion. Stable cardiomediastinal silhouette. No pneumothorax. Age indeterminate suspected fracture deformity at the proximal right humerus. IMPRESSION: 1. Interim development of bilateral airspace disease in the mid to lower lungs compatible with multifocal pneumonia 2. Age indeterminate deformity of the proximal right humerus. Dedicated right shoulder  radiographs may be obtained if clinically warranted. Electronically Signed   By: Donavan Foil M.D.   On: 02/15/2019 16:47   DG Chest Port 1 View  Result Date: 02/12/2019 CLINICAL DATA:  Shortness of breath, cough and fever EXAM: PORTABLE CHEST 1 VIEW COMPARISON:  July 06, 2017 FINDINGS: The heart size and mediastinal contours are within normal limits. Both lungs are clear. The visualized skeletal structures are unremarkable. IMPRESSION: No active disease. Electronically Signed   By: Prudencio Pair M.D.   On: 02/12/2019 01:41   VAS [place] LOWER EXTREMITY VENOUS (DVT)  Result Date: 03/08/2019  Lower Venous Study Indications: Elevated Ddimer.  Risk Factors: COVID 19 positive. Limitations: Poor ultrasound/tissue interface. Comparison Study: No prior studies. Performing Technologist: Oliver Hum RVT  Examination Guidelines: A complete evaluation includes B-mode imaging, spectral Doppler, color Doppler, and power Doppler as needed of all accessible portions of each vessel. Bilateral testing is considered an integral part of a complete examination. Limited examinations for reoccurring indications may be performed as noted.  +---------+---------------+---------+-----------+----------+--------------+  RIGHT     Compressibility Phasicity Spontaneity Properties Thrombus Aging  +---------+---------------+---------+-----------+----------+--------------+  CFV       Full            Yes  Yes                                    +---------+---------------+---------+-----------+----------+--------------+  SFJ       Full                                                             +---------+---------------+---------+-----------+----------+--------------+  FV Prox   Full                                                             +---------+---------------+---------+-----------+----------+--------------+  FV Mid    Full                                                              +---------+---------------+---------+-----------+----------+--------------+  FV Distal Full                                                             +---------+---------------+---------+-----------+----------+--------------+  PFV       Full                                                             +---------+---------------+---------+-----------+----------+--------------+  POP       Full            Yes       Yes                                    +---------+---------------+---------+-----------+----------+--------------+  PTV       Full                                                             +---------+---------------+---------+-----------+----------+--------------+  PERO      Full                                                             +---------+---------------+---------+-----------+----------+--------------+   +---------+---------------+---------+-----------+----------+--------------+  LEFT      Compressibility Phasicity Spontaneity Properties Thrombus Aging  +---------+---------------+---------+-----------+----------+--------------+  CFV       Full            Yes       Yes                                    +---------+---------------+---------+-----------+----------+--------------+  SFJ       Full                                                             +---------+---------------+---------+-----------+----------+--------------+  FV Prox   Full                                                             +---------+---------------+---------+-----------+----------+--------------+  FV Mid    Full                                                             +---------+---------------+---------+-----------+----------+--------------+  FV Distal Full                                                             +---------+---------------+---------+-----------+----------+--------------+  PFV       Full                                                              +---------+---------------+---------+-----------+----------+--------------+  POP       Full            Yes       Yes                                    +---------+---------------+---------+-----------+----------+--------------+  PTV       Full                                                             +---------+---------------+---------+-----------+----------+--------------+  PERO      Full                                                             +---------+---------------+---------+-----------+----------+--------------+  Summary: Right: There is no evidence of deep vein thrombosis in the lower extremity. No cystic structure found in the popliteal fossa. Left: There is no evidence of deep vein thrombosis in the lower extremity. No cystic structure found in the popliteal fossa.  *See table(s) above for measurements and observations. Electronically signed by Deitra Mayo MD on 03/08/2019 at 3:52:34 PM.    Final

## 2019-03-12 NOTE — Progress Notes (Signed)
Pt now sitting up in chair. Pt is now brushing her teeth and washing her face. Pt has decreased appetite and ate about 25% of breakfast. Pt able to ambulate to bathroom with minimal assist and use of walker. Pt on RA while sitting and does not desat with ambulation but feels "short of breath". Educated to breathe deep and slow. VSS with soft BP but pt asymptomatic. Will continue to monitor.

## 2019-03-13 DIAGNOSIS — R2689 Other abnormalities of gait and mobility: Secondary | ICD-10-CM | POA: Diagnosis not present

## 2019-03-13 DIAGNOSIS — M6281 Muscle weakness (generalized): Secondary | ICD-10-CM | POA: Diagnosis not present

## 2019-03-13 DIAGNOSIS — B372 Candidiasis of skin and nail: Secondary | ICD-10-CM | POA: Diagnosis not present

## 2019-03-13 DIAGNOSIS — E119 Type 2 diabetes mellitus without complications: Secondary | ICD-10-CM | POA: Diagnosis not present

## 2019-03-13 DIAGNOSIS — J9601 Acute respiratory failure with hypoxia: Secondary | ICD-10-CM | POA: Diagnosis not present

## 2019-03-13 DIAGNOSIS — J069 Acute upper respiratory infection, unspecified: Secondary | ICD-10-CM | POA: Diagnosis not present

## 2019-03-13 DIAGNOSIS — R41841 Cognitive communication deficit: Secondary | ICD-10-CM | POA: Diagnosis not present

## 2019-03-13 DIAGNOSIS — I10 Essential (primary) hypertension: Secondary | ICD-10-CM | POA: Diagnosis not present

## 2019-03-13 DIAGNOSIS — G4733 Obstructive sleep apnea (adult) (pediatric): Secondary | ICD-10-CM | POA: Diagnosis not present

## 2019-03-13 DIAGNOSIS — M2550 Pain in unspecified joint: Secondary | ICD-10-CM | POA: Diagnosis not present

## 2019-03-13 DIAGNOSIS — Z7401 Bed confinement status: Secondary | ICD-10-CM | POA: Diagnosis not present

## 2019-03-13 DIAGNOSIS — R498 Other voice and resonance disorders: Secondary | ICD-10-CM | POA: Diagnosis not present

## 2019-03-13 DIAGNOSIS — E1169 Type 2 diabetes mellitus with other specified complication: Secondary | ICD-10-CM | POA: Diagnosis not present

## 2019-03-13 DIAGNOSIS — U071 COVID-19: Secondary | ICD-10-CM | POA: Diagnosis not present

## 2019-03-13 DIAGNOSIS — E669 Obesity, unspecified: Secondary | ICD-10-CM | POA: Diagnosis not present

## 2019-03-13 DIAGNOSIS — E781 Pure hyperglyceridemia: Secondary | ICD-10-CM | POA: Diagnosis not present

## 2019-03-13 DIAGNOSIS — J159 Unspecified bacterial pneumonia: Secondary | ICD-10-CM | POA: Diagnosis not present

## 2019-03-13 DIAGNOSIS — R0902 Hypoxemia: Secondary | ICD-10-CM | POA: Diagnosis not present

## 2019-03-13 DIAGNOSIS — R52 Pain, unspecified: Secondary | ICD-10-CM | POA: Diagnosis not present

## 2019-03-13 LAB — GLUCOSE, CAPILLARY
Glucose-Capillary: 145 mg/dL — ABNORMAL HIGH (ref 70–99)
Glucose-Capillary: 176 mg/dL — ABNORMAL HIGH (ref 70–99)
Glucose-Capillary: 121 mg/dL — ABNORMAL HIGH (ref 70–99)

## 2019-03-13 MED ORDER — INSULIN ASPART 100 UNIT/ML ~~LOC~~ SOLN: SUBCUTANEOUS | 11 refills | Status: DC

## 2019-03-13 MED ORDER — FAMOTIDINE 20 MG PO TABS: 20.0000 mg | ORAL_TABLET | Freq: Every day | ORAL | Status: DC

## 2019-03-13 MED ORDER — METOPROLOL TARTRATE 25 MG PO TABS: 12.5000 mg | ORAL_TABLET | Freq: Two times a day (BID) | ORAL | Status: AC

## 2019-03-13 NOTE — TOC Transition Note (Signed)
Transition of Care [facility]) - CM/SW Discharge Note   Patient Details  Name: Laural Gearty MRN: [id number] Date of Birth: [birth year]  Transition of Care [facility]) CM/SW Contact:  Ninfa Meeker, RN Phone Number: [phone] (working remotely) 03/13/2019, 11:31 AM   Clinical Narrative:  Patient will DC to: [place] date: 03/13/19 Family notified: Patient is aware Transport by: PTAR: 2:00pm  Case manager received authorization number from [place]: [id number]. CM gave this number to [facility]. Patient is medically ready for discharge per MD. Case manager spoke with patient via telephone to update her on today's discharge plan. Bedside RN, Charge Nurse and facility are aware. Discharge Summary and FL2 sent via Hub to [place]. Bedside RN to call report to [phone], patient will go to Room 804 B. Irine Seal asked that patient come at 2pm. Ambulance transport has been requested, Medical necessity form and face sheet printed to the nursing unit.   Final next level of care: [place] Barriers to Discharge: No Barriers Identified   Patient Goals and CMS Choice Patient states their goals for this hospitalization and ongoing recovery are:: Patient states her goal is to go to rehab and get stronger so she can go back to [facility]. CMS Medicare.gov Compare Post Acute Care list provided to:: Patient Choice offered to / list presented to : Patient  Discharge Placement                       Discharge Plan and Services                                     Social Determinants of Health (SDOH) Interventions     Readmission Risk Interventions No flowsheet data found.

## 2019-03-13 NOTE — Progress Notes (Signed)
Called report to [facility] manager at [employer]. Discussed Covid status, patient history, and plan of care. Answered questions. Patient awaiting transport.

## 2019-03-13 NOTE — Discharge Summary (Signed)
PATIENT DETAILS Name: Lindsey Baldwin Age: 73 y.o. Sex: female Date of Birth: [birth year] MRN: [id number]. Admitting Physician: Toy Baker, MD PCP:Sun, Gari Crown, MD  Admit Date: 02/15/2019 Discharge date: 03/13/2019  Recommendations for Outpatient Follow-up:  1. Follow up with PCP in 1-2 weeks 2. Please obtain CMP/CBC in one week 3. Repeat Chest Xray in 4-6 week   Admitted From:  Home  Disposition: SNF   Home Health: No  Equipment/Devices: None  Discharge Condition: Stable  CODE STATUS: FULL CODE  Diet recommendation:  Diet Order            Diet - low sodium heart healthy        Diet Carb Modified        Diet Carb Modified Fluid consistency: Thin; Room service appropriate? Yes  Diet effective now               Brief Summary: Brief Narrative: 73 year old with a history of DM 2, HTN, OSA, and GERD who returned to the ED 11/13 with progressive worsening of dyspnea. At that time she was stable enough to return to her ALF. She returned to the ED again 11/15 and was found to be hypoxic with bilateral infiltrates on a CXR and admitted to [facility].  Significant Events: 11/10 ED evaluation 11/13 ED evaluation 11/15 admit to [facility] via ED 12/6 venous duplex B LE negative for DVT   COVID-19 specific Treatment: Decadron 11/15 > 11/25 Remdesivir 11/15 >11/19 Convalescent plasma 11/16 Actemra 11/17  Brief Hospital Course: Acute Hypoxic Resp Failure due to Covid 19 Viral pneumonia and superimposed bacterial pneumonia: Much improved-prolonged hospital course.  Has completed a course of steroids/remdesivir/Rocephin.  Continue supportive care.  Remains either on room air or on just 1 L of oxygen.  Stable for discharge.  COVID-19 Labs:  No results for input(s): DDIMER, FERRITIN, LDH, CRP in the last 72 hours.  Lab Results  Component Value Date   SARSCOV2NAA POSITIVE (A) 02/13/2019   Colbert Not Detected 01/31/2019     Sinus  tachycardia: Resolved-TSH within normal limits-CTA/lower extremity Doppler negative for VTE.  DM-2: CBG stable with Lantus 16 units daily and SSI-on discharge-we will resume oral hypoglycemic agents and SSI.  Do not think patient requires Lantus on discharge.  Follow with PCP-attending MD at SNF to optimize further.  CBG (last 3)  Recent Labs    03/12/19 1529 03/12/19 2053 03/13/19 0713  GLUCAP 178* 116* 145*    HTN: BP stable with Lasix, metoprolol  HLD: Continue statin  Obesity: Estimated body mass index is 30.48 kg/m as calculated from the following:   Height as of this encounter: 5\' 2"  (1.575 m).   Weight as of this encounter: 75.6 kg.   Deconditioning/debility: Secondary to acute illness-SNF on discharge.   Procedures/Studies: None  Discharge Diagnoses:  Active Problems:   Pneumonia due to COVID-19 virus   Acute respiratory failure due to COVID-19 ([place])   DM2 (diabetes mellitus, type 2) ([place])   Essential hypertension   Hyperlipemia   OSA on CPAP   Discharge Instructions:    Person Under Monitoring Name: Lindsey Baldwin  Location: [facility] [address] [place] [address]   Infection Prevention Recommendations for Individuals Confirmed to have, or Being Evaluated for, 2019 Novel Coronavirus (COVID-19) Infection Who Receive Care at Home  Individuals who are confirmed to have, or are being evaluated for, COVID-19 should follow the prevention steps below until a healthcare provider or local or state health department says  they can return to normal activities.  Stay home except to get medical care You should restrict activities outside your home, except for getting medical care. Do not go to work, school, or public areas, and do not use public transportation or taxis.  Call ahead before visiting your doctor Before your medical appointment, call the healthcare provider and tell them that you have, or are being evaluated for, COVID-19  infection. This will help the healthcare provider's office take steps to keep other people from getting infected. Ask your healthcare provider to call the local or state health department.  Monitor your symptoms Seek prompt medical attention if your illness is worsening (e.g., difficulty breathing). Before going to your medical appointment, call the healthcare provider and tell them that you have, or are being evaluated for, COVID-19 infection. Ask your healthcare provider to call the local or state health department.  Wear a facemask You should wear a facemask that covers your nose and mouth when you are in the same room with other people and when you visit a healthcare provider. People who live with or visit you should also wear a facemask while they are in the same room with you.  Separate yourself from other people in your home As much as possible, you should stay in a different room from other people in your home. Also, you should use a separate bathroom, if available.  Avoid sharing household items You should not share dishes, drinking glasses, cups, eating utensils, towels, bedding, or other items with other people in your home. After using these items, you should wash them thoroughly with soap and water.  Cover your coughs and sneezes Cover your mouth and nose with a tissue when you cough or sneeze, or you can cough or sneeze into your sleeve. Throw used tissues in a lined trash can, and immediately wash your hands with soap and water for at least 20 seconds or use an alcohol-based hand rub.  Wash your [facility] your hands often and thoroughly with soap and water for at least 20 seconds. You can use an alcohol-based hand sanitizer if soap and water are not available and if your hands are not visibly dirty. Avoid touching your eyes, nose, and mouth with unwashed hands.   Prevention Steps for Caregivers and Household Members of Individuals Confirmed to have, or Being Evaluated  for, COVID-19 Infection Being Cared for in the Home  If you live with, or provide care at home for, a person confirmed to have, or being evaluated for, COVID-19 infection please follow these guidelines to prevent infection:  Follow healthcare provider's instructions Make sure that you understand and can help the patient follow any healthcare provider instructions for all care.  Provide for the patient's basic needs You should help the patient with basic needs in the home and provide support for getting groceries, prescriptions, and other personal needs.  Monitor the patient's symptoms If they are getting sicker, call his or her medical provider and tell them that the patient has, or is being evaluated for, COVID-19 infection. This will help the healthcare provider's office take steps to keep other people from getting infected. Ask the healthcare provider to call the local or state health department.  Limit the number of people who have contact with the patient  If possible, have only one caregiver for the patient.  Other household members should stay in another home or place of residence. If this is not possible, they should stay  in another room, or be  separated from the patient as much as possible. Use a separate bathroom, if available.  Restrict visitors who do not have an essential need to be in the home.  Keep older adults, very young children, and other sick people away from the patient Keep older adults, very young children, and those who have compromised immune systems or chronic health conditions away from the patient. This includes people with chronic heart, lung, or kidney conditions, diabetes, and cancer.  Ensure good ventilation Make sure that shared spaces in the home have good air flow, such as from an air conditioner or an opened window, weather permitting.  Wash your hands often  Wash your hands often and thoroughly with soap and water for at least 20 seconds. You  can use an alcohol based hand sanitizer if soap and water are not available and if your hands are not visibly dirty.  Avoid touching your eyes, nose, and mouth with unwashed hands.  Use disposable paper towels to dry your hands. If not available, use dedicated cloth towels and replace them when they become wet.  Wear a facemask and gloves  Wear a disposable facemask at all times in the room and gloves when you touch or have contact with the patient's blood, body fluids, and/or secretions or excretions, such as sweat, saliva, sputum, nasal mucus, vomit, urine, or feces.  Ensure the mask fits over your nose and mouth tightly, and do not touch it during use.  Throw out disposable facemasks and gloves after using them. Do not reuse.  Wash your hands immediately after removing your facemask and gloves.  If your personal clothing becomes contaminated, carefully remove clothing and launder. Wash your hands after handling contaminated clothing.  Place all used disposable facemasks, gloves, and other waste in a lined container before disposing them with other household waste.  Remove gloves and wash your hands immediately after handling these items.  Do not share dishes, glasses, or other household items with the patient  Avoid sharing household items. You should not share dishes, drinking glasses, cups, eating utensils, towels, bedding, or other items with a patient who is confirmed to have, or being evaluated for, COVID-19 infection.  After the person uses these items, you should wash them thoroughly with soap and water.  Wash laundry thoroughly  Immediately remove and wash clothes or bedding that have blood, body fluids, and/or secretions or excretions, such as sweat, saliva, sputum, nasal mucus, vomit, urine, or feces, on them.  Wear gloves when handling laundry from the patient.  Read and follow directions on labels of laundry or clothing items and detergent. In general, wash and dry with  the warmest temperatures recommended on the label.  Clean all areas the individual has used often  Clean all touchable surfaces, such as counters, tabletops, doorknobs, bathroom fixtures, toilets, phones, keyboards, tablets, and bedside tables, every day. Also, clean any surfaces that may have blood, body fluids, and/or secretions or excretions on them.  Wear gloves when cleaning surfaces the patient has come in contact with.  Use a diluted bleach solution (e.g., dilute bleach with 1 part bleach and 10 parts water) or a household disinfectant with a label that says EPA-registered for coronaviruses. To make a bleach solution at home, add 1 tablespoon of bleach to 1 quart (4 cups) of water. For a larger supply, add  cup of bleach to 1 gallon (16 cups) of water.  Read labels of cleaning products and follow recommendations provided on product labels. Labels contain instructions for safe  and effective use of the cleaning product including precautions you should take when applying the product, such as wearing gloves or eye protection and making sure you have good ventilation during use of the product.  Remove gloves and wash hands immediately after cleaning.  Monitor yourself for signs and symptoms of illness Caregivers and household members are considered close contacts, should monitor their health, and will be asked to limit movement outside of the home to the extent possible. Follow the monitoring steps for close contacts listed on the symptom monitoring form.   ? If you have additional questions, contact your local health department or call the epidemiologist on call at [phone] (available 24/7). ? This guidance is subject to change. For the most up-to-date guidance from CDC, please refer to their website: YouBlogs.pl    Activity:  As tolerated with Full fall precautions use walker/cane & assistance as needed   Discharge  Instructions    Call MD for:  difficulty breathing, headache or visual disturbances   Complete by: As directed    Call MD for:  extreme fatigue   Complete by: As directed    Call MD for:  persistant dizziness or light-headedness   Complete by: As directed    Call MD for:  persistant nausea and vomiting   Complete by: As directed    Diet - low sodium heart healthy   Complete by: As directed    Diet Carb Modified   Complete by: As directed    Discharge instructions   Complete by: As directed    Follow with Primary MD  Donald Prose, MD in 1-2 weeks  Please get a complete blood count and chemistry panel checked by your Primary MD at your next visit, and again as instructed by your Primary MD.  Get Medicines reviewed and adjusted: Please take all your medications with you for your next visit with your Primary MD  Laboratory/radiological data: Please request your Primary MD to go over all hospital tests and procedure/radiological results at the follow up, please ask your Primary MD to get all Hospital records sent to his/her office.  In some cases, they will be blood work, cultures and biopsy results pending at the time of your discharge. Please request that your primary care M.D. follows up on these results.  Also Note the following: If you experience worsening of your admission symptoms, develop shortness of breath, life threatening emergency, suicidal or homicidal thoughts you must seek medical attention immediately by calling 911 or calling your MD immediately  if symptoms less severe.  You must read complete instructions/literature along with all the possible adverse reactions/side effects for all the Medicines you take and that have been prescribed to you. Take any new Medicines after you have completely understood and accpet all the possible adverse reactions/side effects.   Do not drive when taking Pain medications or sleeping medications (Benzodaizepines)  Do not take more than  prescribed Pain, Sleep and Anxiety Medications. It is not advisable to combine anxiety,sleep and pain medications without talking with your primary care practitioner  Special Instructions: If you have smoked or chewed Tobacco  in the last 2 yrs please stop smoking, stop any regular Alcohol  and or any Recreational drug use.  Wear Seat belts while driving.  Please note: You were cared for by a hospitalist during your hospital stay. Once you are discharged, your primary care physician will handle any further medical issues. Please note that NO REFILLS for any discharge medications will be authorized once  you are discharged, as it is imperative that you return to your primary care physician (or establish a relationship with a primary care physician if you do not have one) for your post hospital discharge needs so that they can reassess your need for medications and monitor your lab values.   Check CBGs before meals and at bedtime   Increase activity slowly   Complete by: As directed      Allergies as of 03/13/2019      Reactions   Penicillins Swelling, Other (See Comments)   Has patient had a PCN reaction causing immediate rash, facial/tongue/throat swelling, SOB or lightheadedness with hypotension: YES Has patient had a PCN reaction causing severe rash involving mucus membranes or skin necrosis: NO Has patient had a PCN reaction that required hospitalization: NO Has patient had a PCN reaction occurring within the last 10 years: NO If all of the above answers are "NO", then may proceed with Cephalosporin use.   Sulfa Antibiotics Rash      Medication List    STOP taking these medications   enalapril 2.5 MG tablet Commonly known as: VASOTEC   HYDROcodone-acetaminophen 5-325 MG tablet Commonly known as: Norco     TAKE these medications   acetaminophen 500 MG tablet Commonly known as: TYLENOL Take 500 mg by mouth every 6 (six) hours as needed (for pain.).   apraclonidine 0.5 %  ophthalmic solution Commonly known as: IOPIDINE Place 1 drop into the right eye 2 (two) times daily.   aspirin 81 MG chewable tablet Chew 1 tablet (81 mg total) by mouth 2 (two) times daily.   atorvastatin 40 MG tablet Commonly known as: LIPITOR Take 40 mg by mouth every evening.   CALCIUM + D3 PO Take 1 tablet by mouth daily.   calcium carbonate 500 MG chewable tablet Commonly known as: TUMS - dosed in mg elemental calcium Chew 1 tablet by mouth 4 (four) times daily as needed for indigestion or heartburn.   famotidine 20 MG tablet Commonly known as: PEPCID Take 1 tablet (20 mg total) by mouth daily. Start taking on: March 14, 2019   furosemide 20 MG tablet Commonly known as: LASIX Take 20 mg by mouth daily.   ibuprofen 200 MG tablet Commonly known as: ADVIL Take 200 mg by mouth every 8 (eight) hours as needed (pain.).   insulin aspart 100 UNIT/ML injection Commonly known as: novoLOG 0-15 Units, Subcutaneous, 3 times daily with meals CBG < 70: Implement Hypoglycemia protocol CBG 70 - 120: 0 units CBG 121 - 150: 2 units CBG 151 - 200: 3 units CBG 201 - 250: 5 units CBG 251 - 300: 8 units CBG 301 - 350: 11 units CBG 351 - 400: 15 units CBG > 400: call MD   latanoprost 0.005 % ophthalmic solution Commonly known as: XALATAN Place 1 drop into the right eye at bedtime.   metFORMIN 500 MG 24 hr tablet Commonly known as: GLUCOPHAGE-XR Take 1,000 mg by mouth daily.   metoprolol tartrate 25 MG tablet Commonly known as: LOPRESSOR Take 0.5 tablets (12.5 mg total) by mouth 2 (two) times daily.   multivitamin with minerals tablet Take 1 tablet by mouth daily.   Myrbetriq 50 MG Tb24 tablet Generic drug: mirabegron ER Take 50 mg by mouth daily.   ondansetron 8 MG tablet Commonly known as: ZOFRAN Take 8 mg by mouth 3 (three) times daily as needed for nausea.   sitaGLIPtin 100 MG tablet Commonly known as: JANUVIA Take 100 mg by mouth  every morning.   tamsulosin  0.4 MG Caps capsule Commonly known as: FLOMAX Take 0.4 mg by mouth daily.   timolol 0.5 % ophthalmic solution Commonly known as: BETIMOL Place 1 drop into the right eye 2 (two) times daily.      Follow-up Information    Donald Prose, MD. Schedule an appointment as soon as possible for a visit in 1 week(s).   Specialty: Family Medicine Contact information: [place] [address] [phone]          Allergies  Allergen Reactions  . Penicillins Swelling and Other (See Comments)    Has patient had a PCN reaction causing immediate rash, facial/tongue/throat swelling, SOB or lightheadedness with hypotension: YES Has patient had a PCN reaction causing severe rash involving mucus membranes or skin necrosis: NO Has patient had a PCN reaction that required hospitalization: NO Has patient had a PCN reaction occurring within the last 10 years: NO If all of the above answers are "NO", then may proceed with Cephalosporin use.   . Sulfa Antibiotics Rash    Consultations:   None   Other Procedures/Studies: DG Chest 1 View  Result Date: 02/19/2019 CLINICAL DATA:  Follow-up pneumonia. EXAM: CHEST  1 VIEW COMPARISON:  02/15/2019. FINDINGS: Mild cardiac enlargement. Significant interval increase in bilateral airspace opacities within the mid and lower lung zones. No pleural effusion or edema. Visualized osseous structures are unremarkable. IMPRESSION: 1. Worsening aeration to both lungs compatible with multifocal pneumonia. 2. Stable cardiac enlargement. 3. No new findings noted in the chest. Electronically Signed   By: Kerby Moors M.D.   On: 02/19/2019 10:13   CT ANGIO CHEST PE W OR WO CONTRAST  Result Date: 02/22/2019 CLINICAL DATA:  Shortness of breath with concern for PE. EXAM: CT ANGIOGRAPHY CHEST WITH CONTRAST TECHNIQUE: Multidetector CT imaging of the chest was performed using the standard protocol during bolus administration of intravenous  contrast. Multiplanar CT image reconstructions and MIPs were obtained to evaluate the vascular anatomy. CONTRAST:  33mL OMNIPAQUE IOHEXOL 350 MG/ML SOLN COMPARISON:  None. FINDINGS: Cardiovascular: Evaluation for pulmonary emboli is limited by significant respiratory motion artifact. Given this limitation, no PE was identified.The the heart size is relatively normal. There is no significant pericardial effusion. No evidence for and aortic dissection or aneurysm. Mediastinum/Nodes: --No mediastinal or hilar lymphadenopathy. --No axillary lymphadenopathy. --No supraclavicular lymphadenopathy. --Normal thyroid gland. --The esophagus is unremarkable Lungs/Pleura: There are diffuse bilateral ground-glass airspace opacities with more focal areas of consolidation within the right upper and right lower lobes. The trachea is unremarkable. There are trace bilateral pleural effusions. Upper Abdomen: There is hepatic steatosis. Cholelithiasis is noted. Musculoskeletal: No chest wall abnormality. No acute or significant osseous findings. Review of the MIP images confirms the above findings. IMPRESSION: 1. Evaluation for pulmonary emboli is limited by significant respiratory motion artifact. Given this limitation, no PE was identified. 2. Diffuse bilateral ground-glass airspace opacities with more focal areas of consolidation within the right upper and right lower lobes. Findings are consistent with the patient's reported history of viral pneumonia. 3. Trace bilateral pleural effusions. 4. Hepatic steatosis. 5. Cholelithiasis. Electronically Signed   By: Constance Holster M.D.   On: 02/22/2019 17:39   DG Chest Port 1 View  Result Date: 02/15/2019 CLINICAL DATA:  Short of breath chest pain COVID EXAM: PORTABLE CHEST 1 VIEW COMPARISON:  02/12/2019 FINDINGS: Interim development of patchy airspace disease within the bilateral mid to lower lung zones. No pleural effusion. Stable cardiomediastinal silhouette.  No pneumothorax. Age  indeterminate suspected fracture deformity at the proximal right humerus. IMPRESSION: 1. Interim development of bilateral airspace disease in the mid to lower lungs compatible with multifocal pneumonia 2. Age indeterminate deformity of the proximal right humerus. Dedicated right shoulder radiographs may be obtained if clinically warranted. Electronically Signed   By: Donavan Foil M.D.   On: 02/15/2019 16:47   DG Chest Port 1 View  Result Date: 02/12/2019 CLINICAL DATA:  Shortness of breath, cough and fever EXAM: PORTABLE CHEST 1 VIEW COMPARISON:  July 06, 2017 FINDINGS: The heart size and mediastinal contours are within normal limits. Both lungs are clear. The visualized skeletal structures are unremarkable. IMPRESSION: No active disease. Electronically Signed   By: Prudencio Pair M.D.   On: 02/12/2019 01:41   VAS [place] LOWER EXTREMITY VENOUS (DVT)  Result Date: 03/08/2019  Lower Venous Study Indications: Elevated Ddimer.  Risk Factors: COVID 19 positive. Limitations: Poor ultrasound/tissue interface. Comparison Study: No prior studies. Performing Technologist: Oliver Hum RVT  Examination Guidelines: A complete evaluation includes B-mode imaging, spectral Doppler, color Doppler, and power Doppler as needed of all accessible portions of each vessel. Bilateral testing is considered an integral part of a complete examination. Limited examinations for reoccurring indications may be performed as noted.  +---------+---------------+---------+-----------+----------+--------------+ RIGHT    CompressibilityPhasicitySpontaneityPropertiesThrombus Aging +---------+---------------+---------+-----------+----------+--------------+ CFV      Full           Yes      Yes                                 +---------+---------------+---------+-----------+----------+--------------+ SFJ      Full                                                         +---------+---------------+---------+-----------+----------+--------------+ FV Prox  Full                                                        +---------+---------------+---------+-----------+----------+--------------+ FV Mid   Full                                                        +---------+---------------+---------+-----------+----------+--------------+ FV DistalFull                                                        +---------+---------------+---------+-----------+----------+--------------+ PFV      Full                                                        +---------+---------------+---------+-----------+----------+--------------+ POP      Full  Yes      Yes                                 +---------+---------------+---------+-----------+----------+--------------+ PTV      Full                                                        +---------+---------------+---------+-----------+----------+--------------+ PERO     Full                                                        +---------+---------------+---------+-----------+----------+--------------+   +---------+---------------+---------+-----------+----------+--------------+ LEFT     CompressibilityPhasicitySpontaneityPropertiesThrombus Aging +---------+---------------+---------+-----------+----------+--------------+ CFV      Full           Yes      Yes                                 +---------+---------------+---------+-----------+----------+--------------+ SFJ      Full                                                        +---------+---------------+---------+-----------+----------+--------------+ FV Prox  Full                                                        +---------+---------------+---------+-----------+----------+--------------+ FV Mid   Full                                                         +---------+---------------+---------+-----------+----------+--------------+ FV DistalFull                                                        +---------+---------------+---------+-----------+----------+--------------+ PFV      Full                                                        +---------+---------------+---------+-----------+----------+--------------+ POP      Full           Yes      Yes                                 +---------+---------------+---------+-----------+----------+--------------+ PTV  Full                                                        +---------+---------------+---------+-----------+----------+--------------+ PERO     Full                                                        +---------+---------------+---------+-----------+----------+--------------+     Summary: Right: There is no evidence of deep vein thrombosis in the lower extremity. No cystic structure found in the popliteal fossa. Left: There is no evidence of deep vein thrombosis in the lower extremity. No cystic structure found in the popliteal fossa.  *See table(s) above for measurements and observations. Electronically signed by Deitra Mayo MD on 03/08/2019 at 3:52:34 PM.    Final      TODAY-DAY OF DISCHARGE:  Subjective:   [name] today has no headache,no chest abdominal pain,no new weakness tingling or numbness, feels much better wants to go home today.   Objective:   Blood pressure 108/70, pulse (!) 103, temperature 97.7 F (36.5 C), temperature source Oral, resp. rate 20, height 5\' 2"  (1.575 m), weight 75.6 kg, SpO2 97 %.  Intake/Output Summary (Last 24 hours) at 03/13/2019 1019 Last data filed at 03/12/2019 2217 Gross per 24 hour  Intake 480 ml  Output 450 ml  Net 30 ml   Filed Weights   02/15/19 2300  Weight: 75.6 kg    Exam: Awake Alert, Oriented *3, No new F.N deficits, Normal affect [place].AT,PERRAL Supple Neck,No JVD, No cervical  lymphadenopathy appriciated.  Symmetrical Chest wall movement, Good air movement bilaterally, CTAB RRR,No Gallops,Rubs or new Murmurs, No Parasternal Heave +ve B.Sounds, Abd Soft, Non tender, No organomegaly appriciated, No rebound -guarding or rigidity. No Cyanosis, Clubbing or edema, No new Rash or bruise   PERTINENT RADIOLOGIC STUDIES: DG Chest 1 View  Result Date: 02/19/2019 CLINICAL DATA:  Follow-up pneumonia. EXAM: CHEST  1 VIEW COMPARISON:  02/15/2019. FINDINGS: Mild cardiac enlargement. Significant interval increase in bilateral airspace opacities within the mid and lower lung zones. No pleural effusion or edema. Visualized osseous structures are unremarkable. IMPRESSION: 1. Worsening aeration to both lungs compatible with multifocal pneumonia. 2. Stable cardiac enlargement. 3. No new findings noted in the chest. Electronically Signed   By: Kerby Moors M.D.   On: 02/19/2019 10:13   CT ANGIO CHEST PE W OR WO CONTRAST  Result Date: 02/22/2019 CLINICAL DATA:  Shortness of breath with concern for PE. EXAM: CT ANGIOGRAPHY CHEST WITH CONTRAST TECHNIQUE: Multidetector CT imaging of the chest was performed using the standard protocol during bolus administration of intravenous contrast. Multiplanar CT image reconstructions and MIPs were obtained to evaluate the vascular anatomy. CONTRAST:  76mL OMNIPAQUE IOHEXOL 350 MG/ML SOLN COMPARISON:  None. FINDINGS: Cardiovascular: Evaluation for pulmonary emboli is limited by significant respiratory motion artifact. Given this limitation, no PE was identified.The the heart size is relatively normal. There is no significant pericardial effusion. No evidence for and aortic dissection or aneurysm. Mediastinum/Nodes: --No mediastinal or hilar lymphadenopathy. --No axillary lymphadenopathy. --No supraclavicular lymphadenopathy. --Normal thyroid gland. --The esophagus is unremarkable Lungs/Pleura: There are diffuse bilateral ground-glass airspace opacities with  more focal areas  of consolidation within the right upper and right lower lobes. The trachea is unremarkable. There are trace bilateral pleural effusions. Upper Abdomen: There is hepatic steatosis. Cholelithiasis is noted. Musculoskeletal: No chest wall abnormality. No acute or significant osseous findings. Review of the MIP images confirms the above findings. IMPRESSION: 1. Evaluation for pulmonary emboli is limited by significant respiratory motion artifact. Given this limitation, no PE was identified. 2. Diffuse bilateral ground-glass airspace opacities with more focal areas of consolidation within the right upper and right lower lobes. Findings are consistent with the patient's reported history of viral pneumonia. 3. Trace bilateral pleural effusions. 4. Hepatic steatosis. 5. Cholelithiasis. Electronically Signed   By: Constance Holster M.D.   On: 02/22/2019 17:39   DG Chest Port 1 View  Result Date: 02/15/2019 CLINICAL DATA:  Short of breath chest pain COVID EXAM: PORTABLE CHEST 1 VIEW COMPARISON:  02/12/2019 FINDINGS: Interim development of patchy airspace disease within the bilateral mid to lower lung zones. No pleural effusion. Stable cardiomediastinal silhouette. No pneumothorax. Age indeterminate suspected fracture deformity at the proximal right humerus. IMPRESSION: 1. Interim development of bilateral airspace disease in the mid to lower lungs compatible with multifocal pneumonia 2. Age indeterminate deformity of the proximal right humerus. Dedicated right shoulder radiographs may be obtained if clinically warranted. Electronically Signed   By: Donavan Foil M.D.   On: 02/15/2019 16:47   DG Chest Port 1 View  Result Date: 02/12/2019 CLINICAL DATA:  Shortness of breath, cough and fever EXAM: PORTABLE CHEST 1 VIEW COMPARISON:  July 06, 2017 FINDINGS: The heart size and mediastinal contours are within normal limits. Both lungs are clear. The visualized skeletal structures are unremarkable.  IMPRESSION: No active disease. Electronically Signed   By: Prudencio Pair M.D.   On: 02/12/2019 01:41   VAS [place] LOWER EXTREMITY VENOUS (DVT)  Result Date: 03/08/2019  Lower Venous Study Indications: Elevated Ddimer.  Risk Factors: COVID 19 positive. Limitations: Poor ultrasound/tissue interface. Comparison Study: No prior studies. Performing Technologist: Oliver Hum RVT  Examination Guidelines: A complete evaluation includes B-mode imaging, spectral Doppler, color Doppler, and power Doppler as needed of all accessible portions of each vessel. Bilateral testing is considered an integral part of a complete examination. Limited examinations for reoccurring indications may be performed as noted.  +---------+---------------+---------+-----------+----------+--------------+ RIGHT    CompressibilityPhasicitySpontaneityPropertiesThrombus Aging +---------+---------------+---------+-----------+----------+--------------+ CFV      Full           Yes      Yes                                 +---------+---------------+---------+-----------+----------+--------------+ SFJ      Full                                                        +---------+---------------+---------+-----------+----------+--------------+ FV Prox  Full                                                        +---------+---------------+---------+-----------+----------+--------------+ FV Mid   Full                                                        +---------+---------------+---------+-----------+----------+--------------+  FV DistalFull                                                        +---------+---------------+---------+-----------+----------+--------------+ PFV      Full                                                        +---------+---------------+---------+-----------+----------+--------------+ POP      Full           Yes      Yes                                  +---------+---------------+---------+-----------+----------+--------------+ PTV      Full                                                        +---------+---------------+---------+-----------+----------+--------------+ PERO     Full                                                        +---------+---------------+---------+-----------+----------+--------------+   +---------+---------------+---------+-----------+----------+--------------+ LEFT     CompressibilityPhasicitySpontaneityPropertiesThrombus Aging +---------+---------------+---------+-----------+----------+--------------+ CFV      Full           Yes      Yes                                 +---------+---------------+---------+-----------+----------+--------------+ SFJ      Full                                                        +---------+---------------+---------+-----------+----------+--------------+ FV Prox  Full                                                        +---------+---------------+---------+-----------+----------+--------------+ FV Mid   Full                                                        +---------+---------------+---------+-----------+----------+--------------+ FV DistalFull                                                        +---------+---------------+---------+-----------+----------+--------------+  PFV      Full                                                        +---------+---------------+---------+-----------+----------+--------------+ POP      Full           Yes      Yes                                 +---------+---------------+---------+-----------+----------+--------------+ PTV      Full                                                        +---------+---------------+---------+-----------+----------+--------------+ PERO     Full                                                         +---------+---------------+---------+-----------+----------+--------------+     Summary: Right: There is no evidence of deep vein thrombosis in the lower extremity. No cystic structure found in the popliteal fossa. Left: There is no evidence of deep vein thrombosis in the lower extremity. No cystic structure found in the popliteal fossa.  *See table(s) above for measurements and observations. Electronically signed by Deitra Mayo MD on 03/08/2019 at 3:52:34 PM.    Final      PERTINENT LAB RESULTS: CBC: No results for input(s): WBC, HGB, HCT, PLT in the last 72 hours. CMET CMP     Component Value Date/Time   NA 137 03/10/2019 0245   K 4.3 03/10/2019 0245   CL 95 (L) 03/10/2019 0245   CO2 28 03/10/2019 0245   GLUCOSE 136 (H) 03/10/2019 0245   BUN 22 03/10/2019 0245   CREATININE 0.74 03/10/2019 0245   CALCIUM 8.6 (L) 03/10/2019 0245   PROT 5.1 (L) 03/05/2019 0500   ALBUMIN 2.9 (L) 03/05/2019 0500   AST 39 03/05/2019 0500   ALT 57 (H) 03/05/2019 0500   ALKPHOS 73 03/05/2019 0500   BILITOT 0.9 03/05/2019 0500   GFRNONAA >60 03/10/2019 0245   GFRAA >60 03/10/2019 0245    GFR Estimated Creatinine Clearance: 61.4 mL/min (by C-G formula based on SCr of 0.74 mg/dL). No results for input(s): LIPASE, AMYLASE in the last 72 hours. No results for input(s): CKTOTAL, CKMB, CKMBINDEX, TROPONINI in the last 72 hours. Invalid input(s): POCBNP No results for input(s): DDIMER in the last 72 hours. No results for input(s): HGBA1C in the last 72 hours. No results for input(s): CHOL, HDL, LDLCALC, TRIG, CHOLHDL, LDLDIRECT in the last 72 hours. No results for input(s): TSH, T4TOTAL, T3FREE, THYROIDAB in the last 72 hours.  Invalid input(s): FREET3 No results for input(s): VITAMINB12, FOLATE, FERRITIN, TIBC, IRON, RETICCTPCT in the last 72 hours. Coags: No results for input(s): INR in the last 72 hours.  Invalid input(s): PT Microbiology: No results found for this or any previous visit  (from the past 240 hour(s)).  FURTHER DISCHARGE INSTRUCTIONS:  Get Medicines reviewed  and adjusted: Please take all your medications with you for your next visit with your Primary MD  Laboratory/radiological data: Please request your Primary MD to go over all hospital tests and procedure/radiological results at the follow up, please ask your Primary MD to get all Hospital records sent to his/her office.  In some cases, they will be blood work, cultures and biopsy results pending at the time of your discharge. Please request that your primary care M.D. goes through all the records of your hospital data and follows up on these results.  Also Note the following: If you experience worsening of your admission symptoms, develop shortness of breath, life threatening emergency, suicidal or homicidal thoughts you must seek medical attention immediately by calling 911 or calling your MD immediately  if symptoms less severe.  You must read complete instructions/literature along with all the possible adverse reactions/side effects for all the Medicines you take and that have been prescribed to you. Take any new Medicines after you have completely understood and accpet all the possible adverse reactions/side effects.   Do not drive when taking Pain medications or sleeping medications (Benzodaizepines)  Do not take more than prescribed Pain, Sleep and Anxiety Medications. It is not advisable to combine anxiety,sleep and pain medications without talking with your primary care practitioner  Special Instructions: If you have smoked or chewed Tobacco  in the last 2 yrs please stop smoking, stop any regular Alcohol  and or any Recreational drug use.  Wear Seat belts while driving.  Please note: You were cared for by a hospitalist during your hospital stay. Once you are discharged, your primary care physician will handle any further medical issues. Please note that NO REFILLS for any discharge medications will  be authorized once you are discharged, as it is imperative that you return to your primary care physician (or establish a relationship with a primary care physician if you do not have one) for your post hospital discharge needs so that they can reassess your need for medications and monitor your lab values.  Total Time spent coordinating discharge including counseling, education and face to face time equals 35 minutes.  SignedOren Binet 03/13/2019 10:19 AM

## 2019-03-17 DIAGNOSIS — E669 Obesity, unspecified: Secondary | ICD-10-CM | POA: Diagnosis not present

## 2019-03-17 DIAGNOSIS — J9601 Acute respiratory failure with hypoxia: Secondary | ICD-10-CM | POA: Diagnosis not present

## 2019-03-17 DIAGNOSIS — I10 Essential (primary) hypertension: Secondary | ICD-10-CM | POA: Diagnosis not present

## 2019-03-17 DIAGNOSIS — E1169 Type 2 diabetes mellitus with other specified complication: Secondary | ICD-10-CM | POA: Diagnosis not present

## 2019-03-25 DIAGNOSIS — E1169 Type 2 diabetes mellitus with other specified complication: Secondary | ICD-10-CM | POA: Diagnosis not present

## 2019-03-25 DIAGNOSIS — I10 Essential (primary) hypertension: Secondary | ICD-10-CM | POA: Diagnosis not present

## 2019-03-25 DIAGNOSIS — J159 Unspecified bacterial pneumonia: Secondary | ICD-10-CM | POA: Diagnosis not present

## 2019-03-25 DIAGNOSIS — U071 COVID-19: Secondary | ICD-10-CM | POA: Diagnosis not present

## 2019-04-01 DIAGNOSIS — J9601 Acute respiratory failure with hypoxia: Secondary | ICD-10-CM | POA: Diagnosis not present

## 2019-04-01 DIAGNOSIS — U071 COVID-19: Secondary | ICD-10-CM | POA: Diagnosis not present

## 2019-04-01 DIAGNOSIS — B372 Candidiasis of skin and nail: Secondary | ICD-10-CM | POA: Diagnosis not present

## 2019-04-02 DIAGNOSIS — J9601 Acute respiratory failure with hypoxia: Secondary | ICD-10-CM | POA: Diagnosis not present

## 2019-04-02 DIAGNOSIS — G4733 Obstructive sleep apnea (adult) (pediatric): Secondary | ICD-10-CM | POA: Diagnosis not present

## 2019-04-02 DIAGNOSIS — U071 COVID-19: Secondary | ICD-10-CM | POA: Diagnosis not present

## 2019-04-02 DIAGNOSIS — E1169 Type 2 diabetes mellitus with other specified complication: Secondary | ICD-10-CM | POA: Diagnosis not present

## 2019-04-14 DIAGNOSIS — G4733 Obstructive sleep apnea (adult) (pediatric): Secondary | ICD-10-CM | POA: Diagnosis not present

## 2019-04-16 DIAGNOSIS — R2681 Unsteadiness on feet: Secondary | ICD-10-CM | POA: Diagnosis not present

## 2019-04-16 DIAGNOSIS — R41841 Cognitive communication deficit: Secondary | ICD-10-CM | POA: Diagnosis not present

## 2019-04-16 DIAGNOSIS — R0602 Shortness of breath: Secondary | ICD-10-CM | POA: Diagnosis not present

## 2019-04-16 DIAGNOSIS — U071 COVID-19: Secondary | ICD-10-CM | POA: Diagnosis not present

## 2019-04-16 DIAGNOSIS — J9601 Acute respiratory failure with hypoxia: Secondary | ICD-10-CM | POA: Diagnosis not present

## 2019-04-16 DIAGNOSIS — M6281 Muscle weakness (generalized): Secondary | ICD-10-CM | POA: Diagnosis not present

## 2019-04-17 DIAGNOSIS — U071 COVID-19: Secondary | ICD-10-CM | POA: Diagnosis not present

## 2019-04-17 DIAGNOSIS — R262 Difficulty in walking, not elsewhere classified: Secondary | ICD-10-CM | POA: Diagnosis not present

## 2019-04-17 DIAGNOSIS — M6281 Muscle weakness (generalized): Secondary | ICD-10-CM | POA: Diagnosis not present

## 2019-04-17 DIAGNOSIS — R2681 Unsteadiness on feet: Secondary | ICD-10-CM | POA: Diagnosis not present

## 2019-04-17 DIAGNOSIS — E1140 Type 2 diabetes mellitus with diabetic neuropathy, unspecified: Secondary | ICD-10-CM | POA: Diagnosis not present

## 2019-04-17 DIAGNOSIS — R0602 Shortness of breath: Secondary | ICD-10-CM | POA: Diagnosis not present

## 2019-04-17 DIAGNOSIS — J1282 Pneumonia due to coronavirus disease 2019: Secondary | ICD-10-CM | POA: Diagnosis not present

## 2019-04-20 DIAGNOSIS — R2681 Unsteadiness on feet: Secondary | ICD-10-CM | POA: Diagnosis not present

## 2019-04-20 DIAGNOSIS — R0602 Shortness of breath: Secondary | ICD-10-CM | POA: Diagnosis not present

## 2019-04-20 DIAGNOSIS — M6281 Muscle weakness (generalized): Secondary | ICD-10-CM | POA: Diagnosis not present

## 2019-04-21 DIAGNOSIS — R0602 Shortness of breath: Secondary | ICD-10-CM | POA: Diagnosis not present

## 2019-04-21 DIAGNOSIS — R2689 Other abnormalities of gait and mobility: Secondary | ICD-10-CM | POA: Diagnosis not present

## 2019-04-21 DIAGNOSIS — M6281 Muscle weakness (generalized): Secondary | ICD-10-CM | POA: Diagnosis not present

## 2019-04-21 DIAGNOSIS — R41841 Cognitive communication deficit: Secondary | ICD-10-CM | POA: Diagnosis not present

## 2019-04-21 DIAGNOSIS — R2681 Unsteadiness on feet: Secondary | ICD-10-CM | POA: Diagnosis not present

## 2019-04-22 DIAGNOSIS — M6281 Muscle weakness (generalized): Secondary | ICD-10-CM | POA: Diagnosis not present

## 2019-04-22 DIAGNOSIS — R2681 Unsteadiness on feet: Secondary | ICD-10-CM | POA: Diagnosis not present

## 2019-04-22 DIAGNOSIS — R0602 Shortness of breath: Secondary | ICD-10-CM | POA: Diagnosis not present

## 2019-04-22 DIAGNOSIS — R2689 Other abnormalities of gait and mobility: Secondary | ICD-10-CM | POA: Diagnosis not present

## 2019-04-23 DIAGNOSIS — Z7984 Long term (current) use of oral hypoglycemic drugs: Secondary | ICD-10-CM | POA: Diagnosis not present

## 2019-04-23 DIAGNOSIS — E7800 Pure hypercholesterolemia, unspecified: Secondary | ICD-10-CM | POA: Diagnosis not present

## 2019-04-23 DIAGNOSIS — R41841 Cognitive communication deficit: Secondary | ICD-10-CM | POA: Diagnosis not present

## 2019-04-23 DIAGNOSIS — R0602 Shortness of breath: Secondary | ICD-10-CM | POA: Diagnosis not present

## 2019-04-23 DIAGNOSIS — M6281 Muscle weakness (generalized): Secondary | ICD-10-CM | POA: Diagnosis not present

## 2019-04-23 DIAGNOSIS — E1140 Type 2 diabetes mellitus with diabetic neuropathy, unspecified: Secondary | ICD-10-CM | POA: Diagnosis not present

## 2019-04-23 DIAGNOSIS — R2689 Other abnormalities of gait and mobility: Secondary | ICD-10-CM | POA: Diagnosis not present

## 2019-04-23 DIAGNOSIS — R2681 Unsteadiness on feet: Secondary | ICD-10-CM | POA: Diagnosis not present

## 2019-04-24 DIAGNOSIS — M6281 Muscle weakness (generalized): Secondary | ICD-10-CM | POA: Diagnosis not present

## 2019-04-24 DIAGNOSIS — R2681 Unsteadiness on feet: Secondary | ICD-10-CM | POA: Diagnosis not present

## 2019-04-24 DIAGNOSIS — R0602 Shortness of breath: Secondary | ICD-10-CM | POA: Diagnosis not present

## 2019-04-24 DIAGNOSIS — R2689 Other abnormalities of gait and mobility: Secondary | ICD-10-CM | POA: Diagnosis not present

## 2019-04-27 DIAGNOSIS — R2681 Unsteadiness on feet: Secondary | ICD-10-CM | POA: Diagnosis not present

## 2019-04-27 DIAGNOSIS — R2689 Other abnormalities of gait and mobility: Secondary | ICD-10-CM | POA: Diagnosis not present

## 2019-04-27 DIAGNOSIS — R0602 Shortness of breath: Secondary | ICD-10-CM | POA: Diagnosis not present

## 2019-04-27 DIAGNOSIS — M6281 Muscle weakness (generalized): Secondary | ICD-10-CM | POA: Diagnosis not present

## 2019-04-27 DIAGNOSIS — R41841 Cognitive communication deficit: Secondary | ICD-10-CM | POA: Diagnosis not present

## 2019-04-28 DIAGNOSIS — R0602 Shortness of breath: Secondary | ICD-10-CM | POA: Diagnosis not present

## 2019-04-28 DIAGNOSIS — R2681 Unsteadiness on feet: Secondary | ICD-10-CM | POA: Diagnosis not present

## 2019-04-28 DIAGNOSIS — R2689 Other abnormalities of gait and mobility: Secondary | ICD-10-CM | POA: Diagnosis not present

## 2019-04-28 DIAGNOSIS — M6281 Muscle weakness (generalized): Secondary | ICD-10-CM | POA: Diagnosis not present

## 2019-04-29 DIAGNOSIS — R0602 Shortness of breath: Secondary | ICD-10-CM | POA: Diagnosis not present

## 2019-04-29 DIAGNOSIS — M6281 Muscle weakness (generalized): Secondary | ICD-10-CM | POA: Diagnosis not present

## 2019-04-29 DIAGNOSIS — R2681 Unsteadiness on feet: Secondary | ICD-10-CM | POA: Diagnosis not present

## 2019-04-30 DIAGNOSIS — R2689 Other abnormalities of gait and mobility: Secondary | ICD-10-CM | POA: Diagnosis not present

## 2019-04-30 DIAGNOSIS — R2681 Unsteadiness on feet: Secondary | ICD-10-CM | POA: Diagnosis not present

## 2019-04-30 DIAGNOSIS — R0602 Shortness of breath: Secondary | ICD-10-CM | POA: Diagnosis not present

## 2019-04-30 DIAGNOSIS — R41841 Cognitive communication deficit: Secondary | ICD-10-CM | POA: Diagnosis not present

## 2019-04-30 DIAGNOSIS — M6281 Muscle weakness (generalized): Secondary | ICD-10-CM | POA: Diagnosis not present

## 2019-05-01 DIAGNOSIS — M6281 Muscle weakness (generalized): Secondary | ICD-10-CM | POA: Diagnosis not present

## 2019-05-01 DIAGNOSIS — R2689 Other abnormalities of gait and mobility: Secondary | ICD-10-CM | POA: Diagnosis not present

## 2019-05-01 DIAGNOSIS — R0602 Shortness of breath: Secondary | ICD-10-CM | POA: Diagnosis not present

## 2019-05-01 DIAGNOSIS — R2681 Unsteadiness on feet: Secondary | ICD-10-CM | POA: Diagnosis not present

## 2019-05-04 DIAGNOSIS — R2681 Unsteadiness on feet: Secondary | ICD-10-CM | POA: Diagnosis not present

## 2019-05-04 DIAGNOSIS — M6281 Muscle weakness (generalized): Secondary | ICD-10-CM | POA: Diagnosis not present

## 2019-05-04 DIAGNOSIS — R0602 Shortness of breath: Secondary | ICD-10-CM | POA: Diagnosis not present

## 2019-05-05 DIAGNOSIS — R2681 Unsteadiness on feet: Secondary | ICD-10-CM | POA: Diagnosis not present

## 2019-05-05 DIAGNOSIS — R2689 Other abnormalities of gait and mobility: Secondary | ICD-10-CM | POA: Diagnosis not present

## 2019-05-05 DIAGNOSIS — R41841 Cognitive communication deficit: Secondary | ICD-10-CM | POA: Diagnosis not present

## 2019-05-05 DIAGNOSIS — R0602 Shortness of breath: Secondary | ICD-10-CM | POA: Diagnosis not present

## 2019-05-05 DIAGNOSIS — M6281 Muscle weakness (generalized): Secondary | ICD-10-CM | POA: Diagnosis not present

## 2019-05-06 DIAGNOSIS — R0602 Shortness of breath: Secondary | ICD-10-CM | POA: Diagnosis not present

## 2019-05-06 DIAGNOSIS — M6281 Muscle weakness (generalized): Secondary | ICD-10-CM | POA: Diagnosis not present

## 2019-05-06 DIAGNOSIS — R2681 Unsteadiness on feet: Secondary | ICD-10-CM | POA: Diagnosis not present

## 2019-05-06 DIAGNOSIS — R2689 Other abnormalities of gait and mobility: Secondary | ICD-10-CM | POA: Diagnosis not present

## 2019-05-07 DIAGNOSIS — R2689 Other abnormalities of gait and mobility: Secondary | ICD-10-CM | POA: Diagnosis not present

## 2019-05-07 DIAGNOSIS — R0602 Shortness of breath: Secondary | ICD-10-CM | POA: Diagnosis not present

## 2019-05-07 DIAGNOSIS — M6281 Muscle weakness (generalized): Secondary | ICD-10-CM | POA: Diagnosis not present

## 2019-05-07 DIAGNOSIS — R2681 Unsteadiness on feet: Secondary | ICD-10-CM | POA: Diagnosis not present

## 2019-05-08 DIAGNOSIS — R41841 Cognitive communication deficit: Secondary | ICD-10-CM | POA: Diagnosis not present

## 2019-05-08 DIAGNOSIS — R2681 Unsteadiness on feet: Secondary | ICD-10-CM | POA: Diagnosis not present

## 2019-05-08 DIAGNOSIS — R0602 Shortness of breath: Secondary | ICD-10-CM | POA: Diagnosis not present

## 2019-05-08 DIAGNOSIS — M6281 Muscle weakness (generalized): Secondary | ICD-10-CM | POA: Diagnosis not present

## 2019-05-08 DIAGNOSIS — R2689 Other abnormalities of gait and mobility: Secondary | ICD-10-CM | POA: Diagnosis not present

## 2019-05-11 DIAGNOSIS — M6281 Muscle weakness (generalized): Secondary | ICD-10-CM | POA: Diagnosis not present

## 2019-05-11 DIAGNOSIS — R2681 Unsteadiness on feet: Secondary | ICD-10-CM | POA: Diagnosis not present

## 2019-05-11 DIAGNOSIS — U071 COVID-19: Secondary | ICD-10-CM | POA: Diagnosis not present

## 2019-05-11 DIAGNOSIS — R41841 Cognitive communication deficit: Secondary | ICD-10-CM | POA: Diagnosis not present

## 2019-05-11 DIAGNOSIS — J9601 Acute respiratory failure with hypoxia: Secondary | ICD-10-CM | POA: Diagnosis not present

## 2019-05-11 DIAGNOSIS — R0602 Shortness of breath: Secondary | ICD-10-CM | POA: Diagnosis not present

## 2019-05-11 DIAGNOSIS — R2689 Other abnormalities of gait and mobility: Secondary | ICD-10-CM | POA: Diagnosis not present

## 2019-05-12 DIAGNOSIS — R2681 Unsteadiness on feet: Secondary | ICD-10-CM | POA: Diagnosis not present

## 2019-05-12 DIAGNOSIS — M6281 Muscle weakness (generalized): Secondary | ICD-10-CM | POA: Diagnosis not present

## 2019-05-12 DIAGNOSIS — R0602 Shortness of breath: Secondary | ICD-10-CM | POA: Diagnosis not present

## 2019-05-12 DIAGNOSIS — R2689 Other abnormalities of gait and mobility: Secondary | ICD-10-CM | POA: Diagnosis not present

## 2019-05-12 DIAGNOSIS — R41841 Cognitive communication deficit: Secondary | ICD-10-CM | POA: Diagnosis not present

## 2019-05-13 DIAGNOSIS — R2681 Unsteadiness on feet: Secondary | ICD-10-CM | POA: Diagnosis not present

## 2019-05-13 DIAGNOSIS — R41841 Cognitive communication deficit: Secondary | ICD-10-CM | POA: Diagnosis not present

## 2019-05-13 DIAGNOSIS — R2689 Other abnormalities of gait and mobility: Secondary | ICD-10-CM | POA: Diagnosis not present

## 2019-05-13 DIAGNOSIS — R0602 Shortness of breath: Secondary | ICD-10-CM | POA: Diagnosis not present

## 2019-05-13 DIAGNOSIS — M6281 Muscle weakness (generalized): Secondary | ICD-10-CM | POA: Diagnosis not present

## 2019-05-14 DIAGNOSIS — R2681 Unsteadiness on feet: Secondary | ICD-10-CM | POA: Diagnosis not present

## 2019-05-14 DIAGNOSIS — R2689 Other abnormalities of gait and mobility: Secondary | ICD-10-CM | POA: Diagnosis not present

## 2019-05-14 DIAGNOSIS — M6281 Muscle weakness (generalized): Secondary | ICD-10-CM | POA: Diagnosis not present

## 2019-05-14 DIAGNOSIS — R0602 Shortness of breath: Secondary | ICD-10-CM | POA: Diagnosis not present

## 2019-05-15 DIAGNOSIS — R2681 Unsteadiness on feet: Secondary | ICD-10-CM | POA: Diagnosis not present

## 2019-05-15 DIAGNOSIS — R0602 Shortness of breath: Secondary | ICD-10-CM | POA: Diagnosis not present

## 2019-05-15 DIAGNOSIS — M6281 Muscle weakness (generalized): Secondary | ICD-10-CM | POA: Diagnosis not present

## 2019-05-17 DIAGNOSIS — M6281 Muscle weakness (generalized): Secondary | ICD-10-CM | POA: Diagnosis not present

## 2019-05-17 DIAGNOSIS — U071 COVID-19: Secondary | ICD-10-CM | POA: Diagnosis not present

## 2019-05-17 DIAGNOSIS — R41841 Cognitive communication deficit: Secondary | ICD-10-CM | POA: Diagnosis not present

## 2019-05-17 DIAGNOSIS — J9601 Acute respiratory failure with hypoxia: Secondary | ICD-10-CM | POA: Diagnosis not present

## 2019-05-18 DIAGNOSIS — M6281 Muscle weakness (generalized): Secondary | ICD-10-CM | POA: Diagnosis not present

## 2019-05-18 DIAGNOSIS — R2681 Unsteadiness on feet: Secondary | ICD-10-CM | POA: Diagnosis not present

## 2019-05-18 DIAGNOSIS — R0602 Shortness of breath: Secondary | ICD-10-CM | POA: Diagnosis not present

## 2019-05-19 DIAGNOSIS — R2689 Other abnormalities of gait and mobility: Secondary | ICD-10-CM | POA: Diagnosis not present

## 2019-05-19 DIAGNOSIS — R41841 Cognitive communication deficit: Secondary | ICD-10-CM | POA: Diagnosis not present

## 2019-05-19 DIAGNOSIS — M6281 Muscle weakness (generalized): Secondary | ICD-10-CM | POA: Diagnosis not present

## 2019-05-19 DIAGNOSIS — R2681 Unsteadiness on feet: Secondary | ICD-10-CM | POA: Diagnosis not present

## 2019-05-19 DIAGNOSIS — R0602 Shortness of breath: Secondary | ICD-10-CM | POA: Diagnosis not present

## 2019-05-20 DIAGNOSIS — R2689 Other abnormalities of gait and mobility: Secondary | ICD-10-CM | POA: Diagnosis not present

## 2019-05-20 DIAGNOSIS — R0602 Shortness of breath: Secondary | ICD-10-CM | POA: Diagnosis not present

## 2019-05-20 DIAGNOSIS — R2681 Unsteadiness on feet: Secondary | ICD-10-CM | POA: Diagnosis not present

## 2019-05-20 DIAGNOSIS — M6281 Muscle weakness (generalized): Secondary | ICD-10-CM | POA: Diagnosis not present

## 2019-05-21 DIAGNOSIS — R2681 Unsteadiness on feet: Secondary | ICD-10-CM | POA: Diagnosis not present

## 2019-05-21 DIAGNOSIS — M6281 Muscle weakness (generalized): Secondary | ICD-10-CM | POA: Diagnosis not present

## 2019-05-21 DIAGNOSIS — R2689 Other abnormalities of gait and mobility: Secondary | ICD-10-CM | POA: Diagnosis not present

## 2019-05-21 DIAGNOSIS — R0602 Shortness of breath: Secondary | ICD-10-CM | POA: Diagnosis not present

## 2019-05-22 DIAGNOSIS — R2681 Unsteadiness on feet: Secondary | ICD-10-CM | POA: Diagnosis not present

## 2019-05-22 DIAGNOSIS — R0602 Shortness of breath: Secondary | ICD-10-CM | POA: Diagnosis not present

## 2019-05-22 DIAGNOSIS — M6281 Muscle weakness (generalized): Secondary | ICD-10-CM | POA: Diagnosis not present

## 2019-05-22 DIAGNOSIS — R41841 Cognitive communication deficit: Secondary | ICD-10-CM | POA: Diagnosis not present

## 2019-05-25 DIAGNOSIS — R41841 Cognitive communication deficit: Secondary | ICD-10-CM | POA: Diagnosis not present

## 2019-05-25 DIAGNOSIS — R2681 Unsteadiness on feet: Secondary | ICD-10-CM | POA: Diagnosis not present

## 2019-05-25 DIAGNOSIS — R2689 Other abnormalities of gait and mobility: Secondary | ICD-10-CM | POA: Diagnosis not present

## 2019-05-25 DIAGNOSIS — M6281 Muscle weakness (generalized): Secondary | ICD-10-CM | POA: Diagnosis not present

## 2019-05-25 DIAGNOSIS — R0602 Shortness of breath: Secondary | ICD-10-CM | POA: Diagnosis not present

## 2019-05-26 DIAGNOSIS — M6281 Muscle weakness (generalized): Secondary | ICD-10-CM | POA: Diagnosis not present

## 2019-05-26 DIAGNOSIS — R0602 Shortness of breath: Secondary | ICD-10-CM | POA: Diagnosis not present

## 2019-05-26 DIAGNOSIS — R2681 Unsteadiness on feet: Secondary | ICD-10-CM | POA: Diagnosis not present

## 2019-05-27 DIAGNOSIS — R0602 Shortness of breath: Secondary | ICD-10-CM | POA: Diagnosis not present

## 2019-05-27 DIAGNOSIS — M6281 Muscle weakness (generalized): Secondary | ICD-10-CM | POA: Diagnosis not present

## 2019-05-27 DIAGNOSIS — R2689 Other abnormalities of gait and mobility: Secondary | ICD-10-CM | POA: Diagnosis not present

## 2019-05-27 DIAGNOSIS — R2681 Unsteadiness on feet: Secondary | ICD-10-CM | POA: Diagnosis not present

## 2019-05-28 DIAGNOSIS — R2689 Other abnormalities of gait and mobility: Secondary | ICD-10-CM | POA: Diagnosis not present

## 2019-05-28 DIAGNOSIS — M6281 Muscle weakness (generalized): Secondary | ICD-10-CM | POA: Diagnosis not present

## 2019-05-28 DIAGNOSIS — R41841 Cognitive communication deficit: Secondary | ICD-10-CM | POA: Diagnosis not present

## 2019-05-28 DIAGNOSIS — R0602 Shortness of breath: Secondary | ICD-10-CM | POA: Diagnosis not present

## 2019-05-28 DIAGNOSIS — R2681 Unsteadiness on feet: Secondary | ICD-10-CM | POA: Diagnosis not present

## 2019-05-29 DIAGNOSIS — M6281 Muscle weakness (generalized): Secondary | ICD-10-CM | POA: Diagnosis not present

## 2019-05-29 DIAGNOSIS — R2689 Other abnormalities of gait and mobility: Secondary | ICD-10-CM | POA: Diagnosis not present

## 2019-05-29 DIAGNOSIS — R2681 Unsteadiness on feet: Secondary | ICD-10-CM | POA: Diagnosis not present

## 2019-05-29 DIAGNOSIS — R0602 Shortness of breath: Secondary | ICD-10-CM | POA: Diagnosis not present

## 2019-06-01 DIAGNOSIS — M6281 Muscle weakness (generalized): Secondary | ICD-10-CM | POA: Diagnosis not present

## 2019-06-01 DIAGNOSIS — R2689 Other abnormalities of gait and mobility: Secondary | ICD-10-CM | POA: Diagnosis not present

## 2019-06-01 DIAGNOSIS — R2681 Unsteadiness on feet: Secondary | ICD-10-CM | POA: Diagnosis not present

## 2019-06-01 DIAGNOSIS — R41841 Cognitive communication deficit: Secondary | ICD-10-CM | POA: Diagnosis not present

## 2019-06-02 DIAGNOSIS — M6281 Muscle weakness (generalized): Secondary | ICD-10-CM | POA: Diagnosis not present

## 2019-06-02 DIAGNOSIS — R2681 Unsteadiness on feet: Secondary | ICD-10-CM | POA: Diagnosis not present

## 2019-06-03 DIAGNOSIS — R41841 Cognitive communication deficit: Secondary | ICD-10-CM | POA: Diagnosis not present

## 2019-06-03 DIAGNOSIS — R2681 Unsteadiness on feet: Secondary | ICD-10-CM | POA: Diagnosis not present

## 2019-06-03 DIAGNOSIS — M6281 Muscle weakness (generalized): Secondary | ICD-10-CM | POA: Diagnosis not present

## 2019-06-04 DIAGNOSIS — R2681 Unsteadiness on feet: Secondary | ICD-10-CM | POA: Diagnosis not present

## 2019-06-04 DIAGNOSIS — R2689 Other abnormalities of gait and mobility: Secondary | ICD-10-CM | POA: Diagnosis not present

## 2019-06-04 DIAGNOSIS — M6281 Muscle weakness (generalized): Secondary | ICD-10-CM | POA: Diagnosis not present

## 2019-06-04 DIAGNOSIS — R41841 Cognitive communication deficit: Secondary | ICD-10-CM | POA: Diagnosis not present

## 2019-06-05 DIAGNOSIS — R2689 Other abnormalities of gait and mobility: Secondary | ICD-10-CM | POA: Diagnosis not present

## 2019-06-05 DIAGNOSIS — R2681 Unsteadiness on feet: Secondary | ICD-10-CM | POA: Diagnosis not present

## 2019-06-05 DIAGNOSIS — M6281 Muscle weakness (generalized): Secondary | ICD-10-CM | POA: Diagnosis not present

## 2019-06-07 DIAGNOSIS — R2689 Other abnormalities of gait and mobility: Secondary | ICD-10-CM | POA: Diagnosis not present

## 2019-06-07 DIAGNOSIS — M6281 Muscle weakness (generalized): Secondary | ICD-10-CM | POA: Diagnosis not present

## 2019-06-08 DIAGNOSIS — M6281 Muscle weakness (generalized): Secondary | ICD-10-CM | POA: Diagnosis not present

## 2019-06-08 DIAGNOSIS — U071 COVID-19: Secondary | ICD-10-CM | POA: Diagnosis not present

## 2019-06-08 DIAGNOSIS — R2681 Unsteadiness on feet: Secondary | ICD-10-CM | POA: Diagnosis not present

## 2019-06-08 DIAGNOSIS — J9601 Acute respiratory failure with hypoxia: Secondary | ICD-10-CM | POA: Diagnosis not present

## 2019-06-08 DIAGNOSIS — R41841 Cognitive communication deficit: Secondary | ICD-10-CM | POA: Diagnosis not present

## 2019-06-09 DIAGNOSIS — M6281 Muscle weakness (generalized): Secondary | ICD-10-CM | POA: Diagnosis not present

## 2019-06-09 DIAGNOSIS — R2681 Unsteadiness on feet: Secondary | ICD-10-CM | POA: Diagnosis not present

## 2019-06-09 DIAGNOSIS — R41841 Cognitive communication deficit: Secondary | ICD-10-CM | POA: Diagnosis not present

## 2019-06-09 DIAGNOSIS — R2689 Other abnormalities of gait and mobility: Secondary | ICD-10-CM | POA: Diagnosis not present

## 2019-06-10 DIAGNOSIS — M6281 Muscle weakness (generalized): Secondary | ICD-10-CM | POA: Diagnosis not present

## 2019-06-10 DIAGNOSIS — R2689 Other abnormalities of gait and mobility: Secondary | ICD-10-CM | POA: Diagnosis not present

## 2019-06-10 DIAGNOSIS — R2681 Unsteadiness on feet: Secondary | ICD-10-CM | POA: Diagnosis not present

## 2019-06-11 DIAGNOSIS — R2689 Other abnormalities of gait and mobility: Secondary | ICD-10-CM | POA: Diagnosis not present

## 2019-06-11 DIAGNOSIS — R41841 Cognitive communication deficit: Secondary | ICD-10-CM | POA: Diagnosis not present

## 2019-06-11 DIAGNOSIS — R2681 Unsteadiness on feet: Secondary | ICD-10-CM | POA: Diagnosis not present

## 2019-06-11 DIAGNOSIS — M6281 Muscle weakness (generalized): Secondary | ICD-10-CM | POA: Diagnosis not present

## 2019-06-12 DIAGNOSIS — M6281 Muscle weakness (generalized): Secondary | ICD-10-CM | POA: Diagnosis not present

## 2019-06-12 DIAGNOSIS — R2681 Unsteadiness on feet: Secondary | ICD-10-CM | POA: Diagnosis not present

## 2019-06-14 DIAGNOSIS — J9601 Acute respiratory failure with hypoxia: Secondary | ICD-10-CM | POA: Diagnosis not present

## 2019-06-14 DIAGNOSIS — M6281 Muscle weakness (generalized): Secondary | ICD-10-CM | POA: Diagnosis not present

## 2019-06-14 DIAGNOSIS — U071 COVID-19: Secondary | ICD-10-CM | POA: Diagnosis not present

## 2019-06-14 DIAGNOSIS — R41841 Cognitive communication deficit: Secondary | ICD-10-CM | POA: Diagnosis not present

## 2019-06-15 DIAGNOSIS — R41841 Cognitive communication deficit: Secondary | ICD-10-CM | POA: Diagnosis not present

## 2019-06-15 DIAGNOSIS — M6281 Muscle weakness (generalized): Secondary | ICD-10-CM | POA: Diagnosis not present

## 2019-06-15 DIAGNOSIS — R2681 Unsteadiness on feet: Secondary | ICD-10-CM | POA: Diagnosis not present

## 2019-06-15 DIAGNOSIS — R2689 Other abnormalities of gait and mobility: Secondary | ICD-10-CM | POA: Diagnosis not present

## 2019-06-16 DIAGNOSIS — M6281 Muscle weakness (generalized): Secondary | ICD-10-CM | POA: Diagnosis not present

## 2019-06-16 DIAGNOSIS — R2681 Unsteadiness on feet: Secondary | ICD-10-CM | POA: Diagnosis not present

## 2019-06-17 DIAGNOSIS — R2689 Other abnormalities of gait and mobility: Secondary | ICD-10-CM | POA: Diagnosis not present

## 2019-06-17 DIAGNOSIS — M6281 Muscle weakness (generalized): Secondary | ICD-10-CM | POA: Diagnosis not present

## 2019-06-17 DIAGNOSIS — R2681 Unsteadiness on feet: Secondary | ICD-10-CM | POA: Diagnosis not present

## 2019-06-18 DIAGNOSIS — R2681 Unsteadiness on feet: Secondary | ICD-10-CM | POA: Diagnosis not present

## 2019-06-18 DIAGNOSIS — R41841 Cognitive communication deficit: Secondary | ICD-10-CM | POA: Diagnosis not present

## 2019-06-18 DIAGNOSIS — M6281 Muscle weakness (generalized): Secondary | ICD-10-CM | POA: Diagnosis not present

## 2019-06-19 DIAGNOSIS — R2681 Unsteadiness on feet: Secondary | ICD-10-CM | POA: Diagnosis not present

## 2019-06-19 DIAGNOSIS — M6281 Muscle weakness (generalized): Secondary | ICD-10-CM | POA: Diagnosis not present

## 2019-06-19 DIAGNOSIS — R2689 Other abnormalities of gait and mobility: Secondary | ICD-10-CM | POA: Diagnosis not present

## 2019-06-19 DIAGNOSIS — R0609 Other forms of dyspnea: Secondary | ICD-10-CM | POA: Diagnosis not present

## 2019-06-19 DIAGNOSIS — E1140 Type 2 diabetes mellitus with diabetic neuropathy, unspecified: Secondary | ICD-10-CM | POA: Diagnosis not present

## 2019-06-22 DIAGNOSIS — M6281 Muscle weakness (generalized): Secondary | ICD-10-CM | POA: Diagnosis not present

## 2019-06-22 DIAGNOSIS — R2681 Unsteadiness on feet: Secondary | ICD-10-CM | POA: Diagnosis not present

## 2019-06-22 DIAGNOSIS — R41841 Cognitive communication deficit: Secondary | ICD-10-CM | POA: Diagnosis not present

## 2019-06-23 DIAGNOSIS — R2689 Other abnormalities of gait and mobility: Secondary | ICD-10-CM | POA: Diagnosis not present

## 2019-06-23 DIAGNOSIS — M6281 Muscle weakness (generalized): Secondary | ICD-10-CM | POA: Diagnosis not present

## 2019-06-24 DIAGNOSIS — M6281 Muscle weakness (generalized): Secondary | ICD-10-CM | POA: Diagnosis not present

## 2019-06-24 DIAGNOSIS — R41841 Cognitive communication deficit: Secondary | ICD-10-CM | POA: Diagnosis not present

## 2019-06-24 DIAGNOSIS — R2681 Unsteadiness on feet: Secondary | ICD-10-CM | POA: Diagnosis not present

## 2019-06-25 DIAGNOSIS — R2681 Unsteadiness on feet: Secondary | ICD-10-CM | POA: Diagnosis not present

## 2019-06-25 DIAGNOSIS — R2689 Other abnormalities of gait and mobility: Secondary | ICD-10-CM | POA: Diagnosis not present

## 2019-06-25 DIAGNOSIS — M6281 Muscle weakness (generalized): Secondary | ICD-10-CM | POA: Diagnosis not present

## 2019-06-26 DIAGNOSIS — R2681 Unsteadiness on feet: Secondary | ICD-10-CM | POA: Diagnosis not present

## 2019-06-26 DIAGNOSIS — M6281 Muscle weakness (generalized): Secondary | ICD-10-CM | POA: Diagnosis not present

## 2019-06-26 DIAGNOSIS — R2689 Other abnormalities of gait and mobility: Secondary | ICD-10-CM | POA: Diagnosis not present

## 2019-06-29 DIAGNOSIS — M6281 Muscle weakness (generalized): Secondary | ICD-10-CM | POA: Diagnosis not present

## 2019-06-29 DIAGNOSIS — R41841 Cognitive communication deficit: Secondary | ICD-10-CM | POA: Diagnosis not present

## 2019-06-29 DIAGNOSIS — R2681 Unsteadiness on feet: Secondary | ICD-10-CM | POA: Diagnosis not present

## 2019-06-29 DIAGNOSIS — R2689 Other abnormalities of gait and mobility: Secondary | ICD-10-CM | POA: Diagnosis not present

## 2019-06-30 DIAGNOSIS — M6281 Muscle weakness (generalized): Secondary | ICD-10-CM | POA: Diagnosis not present

## 2019-06-30 DIAGNOSIS — R2681 Unsteadiness on feet: Secondary | ICD-10-CM | POA: Diagnosis not present

## 2019-07-01 DIAGNOSIS — M6281 Muscle weakness (generalized): Secondary | ICD-10-CM | POA: Diagnosis not present

## 2019-07-01 DIAGNOSIS — R41841 Cognitive communication deficit: Secondary | ICD-10-CM | POA: Diagnosis not present

## 2019-07-01 DIAGNOSIS — R2681 Unsteadiness on feet: Secondary | ICD-10-CM | POA: Diagnosis not present

## 2019-07-02 DIAGNOSIS — R2689 Other abnormalities of gait and mobility: Secondary | ICD-10-CM | POA: Diagnosis not present

## 2019-07-02 DIAGNOSIS — R2681 Unsteadiness on feet: Secondary | ICD-10-CM | POA: Diagnosis not present

## 2019-07-02 DIAGNOSIS — M6281 Muscle weakness (generalized): Secondary | ICD-10-CM | POA: Diagnosis not present

## 2019-07-03 DIAGNOSIS — M6281 Muscle weakness (generalized): Secondary | ICD-10-CM | POA: Diagnosis not present

## 2019-07-03 DIAGNOSIS — R2681 Unsteadiness on feet: Secondary | ICD-10-CM | POA: Diagnosis not present

## 2019-07-03 DIAGNOSIS — R2689 Other abnormalities of gait and mobility: Secondary | ICD-10-CM | POA: Diagnosis not present

## 2019-07-06 DIAGNOSIS — M6281 Muscle weakness (generalized): Secondary | ICD-10-CM | POA: Diagnosis not present

## 2019-07-06 DIAGNOSIS — R2689 Other abnormalities of gait and mobility: Secondary | ICD-10-CM | POA: Diagnosis not present

## 2019-07-06 DIAGNOSIS — R2681 Unsteadiness on feet: Secondary | ICD-10-CM | POA: Diagnosis not present

## 2019-07-07 DIAGNOSIS — R2689 Other abnormalities of gait and mobility: Secondary | ICD-10-CM | POA: Diagnosis not present

## 2019-07-07 DIAGNOSIS — R2681 Unsteadiness on feet: Secondary | ICD-10-CM | POA: Diagnosis not present

## 2019-07-07 DIAGNOSIS — M6281 Muscle weakness (generalized): Secondary | ICD-10-CM | POA: Diagnosis not present

## 2019-07-08 DIAGNOSIS — R2681 Unsteadiness on feet: Secondary | ICD-10-CM | POA: Diagnosis not present

## 2019-07-08 DIAGNOSIS — R41841 Cognitive communication deficit: Secondary | ICD-10-CM | POA: Diagnosis not present

## 2019-07-08 DIAGNOSIS — M6281 Muscle weakness (generalized): Secondary | ICD-10-CM | POA: Diagnosis not present

## 2019-07-08 DIAGNOSIS — R2689 Other abnormalities of gait and mobility: Secondary | ICD-10-CM | POA: Diagnosis not present

## 2019-07-09 ENCOUNTER — Other Ambulatory Visit: Payer: Self-pay | Admitting: Urology

## 2019-07-09 DIAGNOSIS — J9601 Acute respiratory failure with hypoxia: Secondary | ICD-10-CM | POA: Diagnosis not present

## 2019-07-09 DIAGNOSIS — M6281 Muscle weakness (generalized): Secondary | ICD-10-CM | POA: Diagnosis not present

## 2019-07-09 DIAGNOSIS — R41841 Cognitive communication deficit: Secondary | ICD-10-CM | POA: Diagnosis not present

## 2019-07-09 DIAGNOSIS — R2681 Unsteadiness on feet: Secondary | ICD-10-CM | POA: Diagnosis not present

## 2019-07-09 DIAGNOSIS — U071 COVID-19: Secondary | ICD-10-CM | POA: Diagnosis not present

## 2019-07-10 DIAGNOSIS — R2681 Unsteadiness on feet: Secondary | ICD-10-CM | POA: Diagnosis not present

## 2019-07-10 DIAGNOSIS — M6281 Muscle weakness (generalized): Secondary | ICD-10-CM | POA: Diagnosis not present

## 2019-07-13 DIAGNOSIS — M6281 Muscle weakness (generalized): Secondary | ICD-10-CM | POA: Diagnosis not present

## 2019-07-13 DIAGNOSIS — R2681 Unsteadiness on feet: Secondary | ICD-10-CM | POA: Diagnosis not present

## 2019-07-13 DIAGNOSIS — R2689 Other abnormalities of gait and mobility: Secondary | ICD-10-CM | POA: Diagnosis not present

## 2019-07-14 DIAGNOSIS — M6281 Muscle weakness (generalized): Secondary | ICD-10-CM | POA: Diagnosis not present

## 2019-07-14 DIAGNOSIS — R2681 Unsteadiness on feet: Secondary | ICD-10-CM | POA: Diagnosis not present

## 2019-07-15 DIAGNOSIS — M6281 Muscle weakness (generalized): Secondary | ICD-10-CM | POA: Diagnosis not present

## 2019-07-15 DIAGNOSIS — R2681 Unsteadiness on feet: Secondary | ICD-10-CM | POA: Diagnosis not present

## 2019-07-15 DIAGNOSIS — U071 COVID-19: Secondary | ICD-10-CM | POA: Diagnosis not present

## 2019-07-15 DIAGNOSIS — R41841 Cognitive communication deficit: Secondary | ICD-10-CM | POA: Diagnosis not present

## 2019-07-15 DIAGNOSIS — R2689 Other abnormalities of gait and mobility: Secondary | ICD-10-CM | POA: Diagnosis not present

## 2019-07-15 DIAGNOSIS — J9601 Acute respiratory failure with hypoxia: Secondary | ICD-10-CM | POA: Diagnosis not present

## 2019-07-16 DIAGNOSIS — R2681 Unsteadiness on feet: Secondary | ICD-10-CM | POA: Diagnosis not present

## 2019-07-16 DIAGNOSIS — M6281 Muscle weakness (generalized): Secondary | ICD-10-CM | POA: Diagnosis not present

## 2019-07-17 DIAGNOSIS — M6281 Muscle weakness (generalized): Secondary | ICD-10-CM | POA: Diagnosis not present

## 2019-07-17 DIAGNOSIS — R2681 Unsteadiness on feet: Secondary | ICD-10-CM | POA: Diagnosis not present

## 2019-07-17 DIAGNOSIS — R2689 Other abnormalities of gait and mobility: Secondary | ICD-10-CM | POA: Diagnosis not present

## 2019-07-20 DIAGNOSIS — M6281 Muscle weakness (generalized): Secondary | ICD-10-CM | POA: Diagnosis not present

## 2019-07-20 DIAGNOSIS — R2689 Other abnormalities of gait and mobility: Secondary | ICD-10-CM | POA: Diagnosis not present

## 2019-07-20 DIAGNOSIS — R2681 Unsteadiness on feet: Secondary | ICD-10-CM | POA: Diagnosis not present

## 2019-07-22 DIAGNOSIS — M6281 Muscle weakness (generalized): Secondary | ICD-10-CM | POA: Diagnosis not present

## 2019-07-22 DIAGNOSIS — R2681 Unsteadiness on feet: Secondary | ICD-10-CM | POA: Diagnosis not present

## 2019-07-22 DIAGNOSIS — R2689 Other abnormalities of gait and mobility: Secondary | ICD-10-CM | POA: Diagnosis not present

## 2019-07-23 DIAGNOSIS — R2689 Other abnormalities of gait and mobility: Secondary | ICD-10-CM | POA: Diagnosis not present

## 2019-07-23 DIAGNOSIS — R2681 Unsteadiness on feet: Secondary | ICD-10-CM | POA: Diagnosis not present

## 2019-07-23 DIAGNOSIS — L820 Inflamed seborrheic keratosis: Secondary | ICD-10-CM | POA: Diagnosis not present

## 2019-07-23 DIAGNOSIS — M6281 Muscle weakness (generalized): Secondary | ICD-10-CM | POA: Diagnosis not present

## 2019-07-24 DIAGNOSIS — R2681 Unsteadiness on feet: Secondary | ICD-10-CM | POA: Diagnosis not present

## 2019-07-24 DIAGNOSIS — M6281 Muscle weakness (generalized): Secondary | ICD-10-CM | POA: Diagnosis not present

## 2019-07-27 DIAGNOSIS — R2681 Unsteadiness on feet: Secondary | ICD-10-CM | POA: Diagnosis not present

## 2019-07-27 DIAGNOSIS — M6281 Muscle weakness (generalized): Secondary | ICD-10-CM | POA: Diagnosis not present

## 2019-07-28 DIAGNOSIS — R2689 Other abnormalities of gait and mobility: Secondary | ICD-10-CM | POA: Diagnosis not present

## 2019-07-28 DIAGNOSIS — M6281 Muscle weakness (generalized): Secondary | ICD-10-CM | POA: Diagnosis not present

## 2019-07-29 DIAGNOSIS — R2681 Unsteadiness on feet: Secondary | ICD-10-CM | POA: Diagnosis not present

## 2019-07-29 DIAGNOSIS — M6281 Muscle weakness (generalized): Secondary | ICD-10-CM | POA: Diagnosis not present

## 2019-07-29 DIAGNOSIS — R2689 Other abnormalities of gait and mobility: Secondary | ICD-10-CM | POA: Diagnosis not present

## 2019-07-30 DIAGNOSIS — M6281 Muscle weakness (generalized): Secondary | ICD-10-CM | POA: Diagnosis not present

## 2019-07-30 DIAGNOSIS — R2689 Other abnormalities of gait and mobility: Secondary | ICD-10-CM | POA: Diagnosis not present

## 2019-07-30 DIAGNOSIS — R2681 Unsteadiness on feet: Secondary | ICD-10-CM | POA: Diagnosis not present

## 2019-07-31 DIAGNOSIS — M6281 Muscle weakness (generalized): Secondary | ICD-10-CM | POA: Diagnosis not present

## 2019-07-31 DIAGNOSIS — R2681 Unsteadiness on feet: Secondary | ICD-10-CM | POA: Diagnosis not present

## 2019-07-31 DIAGNOSIS — R2689 Other abnormalities of gait and mobility: Secondary | ICD-10-CM | POA: Diagnosis not present

## 2019-08-03 DIAGNOSIS — M6281 Muscle weakness (generalized): Secondary | ICD-10-CM | POA: Diagnosis not present

## 2019-08-03 DIAGNOSIS — R2689 Other abnormalities of gait and mobility: Secondary | ICD-10-CM | POA: Diagnosis not present

## 2019-08-04 DIAGNOSIS — R2681 Unsteadiness on feet: Secondary | ICD-10-CM | POA: Diagnosis not present

## 2019-08-04 DIAGNOSIS — M6281 Muscle weakness (generalized): Secondary | ICD-10-CM | POA: Diagnosis not present

## 2019-08-05 DIAGNOSIS — M6281 Muscle weakness (generalized): Secondary | ICD-10-CM | POA: Diagnosis not present

## 2019-08-05 DIAGNOSIS — R2689 Other abnormalities of gait and mobility: Secondary | ICD-10-CM | POA: Diagnosis not present

## 2019-08-06 DIAGNOSIS — M6281 Muscle weakness (generalized): Secondary | ICD-10-CM | POA: Diagnosis not present

## 2019-08-06 DIAGNOSIS — R2689 Other abnormalities of gait and mobility: Secondary | ICD-10-CM | POA: Diagnosis not present

## 2019-08-06 DIAGNOSIS — R2681 Unsteadiness on feet: Secondary | ICD-10-CM | POA: Diagnosis not present

## 2019-08-07 DIAGNOSIS — M6281 Muscle weakness (generalized): Secondary | ICD-10-CM | POA: Diagnosis not present

## 2019-08-07 DIAGNOSIS — R2689 Other abnormalities of gait and mobility: Secondary | ICD-10-CM | POA: Diagnosis not present

## 2019-08-07 DIAGNOSIS — R2681 Unsteadiness on feet: Secondary | ICD-10-CM | POA: Diagnosis not present

## 2019-08-08 DIAGNOSIS — J9601 Acute respiratory failure with hypoxia: Secondary | ICD-10-CM | POA: Diagnosis not present

## 2019-08-08 DIAGNOSIS — U071 COVID-19: Secondary | ICD-10-CM | POA: Diagnosis not present

## 2019-08-08 DIAGNOSIS — R41841 Cognitive communication deficit: Secondary | ICD-10-CM | POA: Diagnosis not present

## 2019-08-08 DIAGNOSIS — M6281 Muscle weakness (generalized): Secondary | ICD-10-CM | POA: Diagnosis not present

## 2019-08-10 DIAGNOSIS — M6281 Muscle weakness (generalized): Secondary | ICD-10-CM | POA: Diagnosis not present

## 2019-08-10 DIAGNOSIS — R2681 Unsteadiness on feet: Secondary | ICD-10-CM | POA: Diagnosis not present

## 2019-08-11 DIAGNOSIS — R2681 Unsteadiness on feet: Secondary | ICD-10-CM | POA: Diagnosis not present

## 2019-08-11 DIAGNOSIS — M6281 Muscle weakness (generalized): Secondary | ICD-10-CM | POA: Diagnosis not present

## 2019-08-12 DIAGNOSIS — M6281 Muscle weakness (generalized): Secondary | ICD-10-CM | POA: Diagnosis not present

## 2019-08-12 DIAGNOSIS — R2681 Unsteadiness on feet: Secondary | ICD-10-CM | POA: Diagnosis not present

## 2019-08-12 DIAGNOSIS — R2689 Other abnormalities of gait and mobility: Secondary | ICD-10-CM | POA: Diagnosis not present

## 2019-08-13 DIAGNOSIS — M6281 Muscle weakness (generalized): Secondary | ICD-10-CM | POA: Diagnosis not present

## 2019-08-13 DIAGNOSIS — R2689 Other abnormalities of gait and mobility: Secondary | ICD-10-CM | POA: Diagnosis not present

## 2019-08-14 DIAGNOSIS — R2681 Unsteadiness on feet: Secondary | ICD-10-CM | POA: Diagnosis not present

## 2019-08-14 DIAGNOSIS — R2689 Other abnormalities of gait and mobility: Secondary | ICD-10-CM | POA: Diagnosis not present

## 2019-08-14 DIAGNOSIS — R41841 Cognitive communication deficit: Secondary | ICD-10-CM | POA: Diagnosis not present

## 2019-08-14 DIAGNOSIS — J9601 Acute respiratory failure with hypoxia: Secondary | ICD-10-CM | POA: Diagnosis not present

## 2019-08-14 DIAGNOSIS — U071 COVID-19: Secondary | ICD-10-CM | POA: Diagnosis not present

## 2019-08-14 DIAGNOSIS — M6281 Muscle weakness (generalized): Secondary | ICD-10-CM | POA: Diagnosis not present

## 2019-08-17 DIAGNOSIS — M6281 Muscle weakness (generalized): Secondary | ICD-10-CM | POA: Diagnosis not present

## 2019-08-17 DIAGNOSIS — R2681 Unsteadiness on feet: Secondary | ICD-10-CM | POA: Diagnosis not present

## 2019-08-18 DIAGNOSIS — M6281 Muscle weakness (generalized): Secondary | ICD-10-CM | POA: Diagnosis not present

## 2019-08-18 DIAGNOSIS — R2681 Unsteadiness on feet: Secondary | ICD-10-CM | POA: Diagnosis not present

## 2019-08-19 DIAGNOSIS — M6281 Muscle weakness (generalized): Secondary | ICD-10-CM | POA: Diagnosis not present

## 2019-08-19 DIAGNOSIS — R2689 Other abnormalities of gait and mobility: Secondary | ICD-10-CM | POA: Diagnosis not present

## 2019-08-20 DIAGNOSIS — R2689 Other abnormalities of gait and mobility: Secondary | ICD-10-CM | POA: Diagnosis not present

## 2019-08-20 DIAGNOSIS — M6281 Muscle weakness (generalized): Secondary | ICD-10-CM | POA: Diagnosis not present

## 2019-08-21 DIAGNOSIS — R2681 Unsteadiness on feet: Secondary | ICD-10-CM | POA: Diagnosis not present

## 2019-08-21 DIAGNOSIS — M6281 Muscle weakness (generalized): Secondary | ICD-10-CM | POA: Diagnosis not present

## 2019-08-24 ENCOUNTER — Other Ambulatory Visit: Payer: Self-pay | Admitting: Urology

## 2019-08-24 DIAGNOSIS — R2681 Unsteadiness on feet: Secondary | ICD-10-CM | POA: Diagnosis not present

## 2019-08-24 DIAGNOSIS — M6281 Muscle weakness (generalized): Secondary | ICD-10-CM | POA: Diagnosis not present

## 2019-08-24 DIAGNOSIS — R2689 Other abnormalities of gait and mobility: Secondary | ICD-10-CM | POA: Diagnosis not present

## 2019-08-25 DIAGNOSIS — R2681 Unsteadiness on feet: Secondary | ICD-10-CM | POA: Diagnosis not present

## 2019-08-25 DIAGNOSIS — M6281 Muscle weakness (generalized): Secondary | ICD-10-CM | POA: Diagnosis not present

## 2019-08-26 DIAGNOSIS — M6281 Muscle weakness (generalized): Secondary | ICD-10-CM | POA: Diagnosis not present

## 2019-08-26 DIAGNOSIS — R2689 Other abnormalities of gait and mobility: Secondary | ICD-10-CM | POA: Diagnosis not present

## 2019-08-26 DIAGNOSIS — H5789 Other specified disorders of eye and adnexa: Secondary | ICD-10-CM | POA: Diagnosis not present

## 2019-08-27 DIAGNOSIS — R2681 Unsteadiness on feet: Secondary | ICD-10-CM | POA: Diagnosis not present

## 2019-08-27 DIAGNOSIS — M6281 Muscle weakness (generalized): Secondary | ICD-10-CM | POA: Diagnosis not present

## 2019-08-28 DIAGNOSIS — R2689 Other abnormalities of gait and mobility: Secondary | ICD-10-CM | POA: Diagnosis not present

## 2019-08-28 DIAGNOSIS — M6281 Muscle weakness (generalized): Secondary | ICD-10-CM | POA: Diagnosis not present

## 2019-09-01 DIAGNOSIS — M6281 Muscle weakness (generalized): Secondary | ICD-10-CM | POA: Diagnosis not present

## 2019-09-01 DIAGNOSIS — R2689 Other abnormalities of gait and mobility: Secondary | ICD-10-CM | POA: Diagnosis not present

## 2019-09-02 DIAGNOSIS — H401113 Primary open-angle glaucoma, right eye, severe stage: Secondary | ICD-10-CM | POA: Diagnosis not present

## 2019-09-02 DIAGNOSIS — M6281 Muscle weakness (generalized): Secondary | ICD-10-CM | POA: Diagnosis not present

## 2019-09-02 DIAGNOSIS — Z789 Other specified health status: Secondary | ICD-10-CM | POA: Diagnosis not present

## 2019-09-02 DIAGNOSIS — T50995A Adverse effect of other drugs, medicaments and biological substances, initial encounter: Secondary | ICD-10-CM | POA: Diagnosis not present

## 2019-09-02 DIAGNOSIS — R2681 Unsteadiness on feet: Secondary | ICD-10-CM | POA: Diagnosis not present

## 2019-09-02 DIAGNOSIS — Z970 Presence of artificial eye: Secondary | ICD-10-CM | POA: Diagnosis not present

## 2019-09-02 DIAGNOSIS — Z7984 Long term (current) use of oral hypoglycemic drugs: Secondary | ICD-10-CM | POA: Diagnosis not present

## 2019-09-02 DIAGNOSIS — Z8669 Personal history of other diseases of the nervous system and sense organs: Secondary | ICD-10-CM | POA: Diagnosis not present

## 2019-09-02 DIAGNOSIS — H579 Unspecified disorder of eye and adnexa: Secondary | ICD-10-CM | POA: Diagnosis not present

## 2019-09-02 DIAGNOSIS — E119 Type 2 diabetes mellitus without complications: Secondary | ICD-10-CM | POA: Diagnosis not present

## 2019-09-03 DIAGNOSIS — M6281 Muscle weakness (generalized): Secondary | ICD-10-CM | POA: Diagnosis not present

## 2019-09-03 DIAGNOSIS — R2689 Other abnormalities of gait and mobility: Secondary | ICD-10-CM | POA: Diagnosis not present

## 2019-09-04 DIAGNOSIS — M6281 Muscle weakness (generalized): Secondary | ICD-10-CM | POA: Diagnosis not present

## 2019-09-04 DIAGNOSIS — R2689 Other abnormalities of gait and mobility: Secondary | ICD-10-CM | POA: Diagnosis not present

## 2019-09-04 DIAGNOSIS — R2681 Unsteadiness on feet: Secondary | ICD-10-CM | POA: Diagnosis not present

## 2019-09-07 DIAGNOSIS — N3946 Mixed incontinence: Secondary | ICD-10-CM | POA: Diagnosis not present

## 2019-09-07 DIAGNOSIS — R2689 Other abnormalities of gait and mobility: Secondary | ICD-10-CM | POA: Diagnosis not present

## 2019-09-07 DIAGNOSIS — M6281 Muscle weakness (generalized): Secondary | ICD-10-CM | POA: Diagnosis not present

## 2019-09-08 DIAGNOSIS — R2689 Other abnormalities of gait and mobility: Secondary | ICD-10-CM | POA: Diagnosis not present

## 2019-09-08 DIAGNOSIS — M6281 Muscle weakness (generalized): Secondary | ICD-10-CM | POA: Diagnosis not present

## 2019-09-08 DIAGNOSIS — R41841 Cognitive communication deficit: Secondary | ICD-10-CM | POA: Diagnosis not present

## 2019-09-08 DIAGNOSIS — U071 COVID-19: Secondary | ICD-10-CM | POA: Diagnosis not present

## 2019-09-08 DIAGNOSIS — J9601 Acute respiratory failure with hypoxia: Secondary | ICD-10-CM | POA: Diagnosis not present

## 2019-09-09 DIAGNOSIS — M6281 Muscle weakness (generalized): Secondary | ICD-10-CM | POA: Diagnosis not present

## 2019-09-09 DIAGNOSIS — R2689 Other abnormalities of gait and mobility: Secondary | ICD-10-CM | POA: Diagnosis not present

## 2019-09-10 DIAGNOSIS — N3946 Mixed incontinence: Secondary | ICD-10-CM | POA: Diagnosis not present

## 2019-09-10 DIAGNOSIS — M6281 Muscle weakness (generalized): Secondary | ICD-10-CM | POA: Diagnosis not present

## 2019-09-11 DIAGNOSIS — N3946 Mixed incontinence: Secondary | ICD-10-CM | POA: Diagnosis not present

## 2019-09-11 DIAGNOSIS — M6281 Muscle weakness (generalized): Secondary | ICD-10-CM | POA: Diagnosis not present

## 2019-09-14 DIAGNOSIS — R41841 Cognitive communication deficit: Secondary | ICD-10-CM | POA: Diagnosis not present

## 2019-09-14 DIAGNOSIS — M6281 Muscle weakness (generalized): Secondary | ICD-10-CM | POA: Diagnosis not present

## 2019-09-14 DIAGNOSIS — U071 COVID-19: Secondary | ICD-10-CM | POA: Diagnosis not present

## 2019-09-14 DIAGNOSIS — J9601 Acute respiratory failure with hypoxia: Secondary | ICD-10-CM | POA: Diagnosis not present

## 2019-09-14 DIAGNOSIS — N3946 Mixed incontinence: Secondary | ICD-10-CM | POA: Diagnosis not present

## 2019-09-16 DIAGNOSIS — E1140 Type 2 diabetes mellitus with diabetic neuropathy, unspecified: Secondary | ICD-10-CM | POA: Diagnosis not present

## 2019-09-16 DIAGNOSIS — N3946 Mixed incontinence: Secondary | ICD-10-CM | POA: Diagnosis not present

## 2019-09-16 DIAGNOSIS — M810 Age-related osteoporosis without current pathological fracture: Secondary | ICD-10-CM | POA: Diagnosis not present

## 2019-09-16 DIAGNOSIS — Z0000 Encounter for general adult medical examination without abnormal findings: Secondary | ICD-10-CM | POA: Diagnosis not present

## 2019-09-16 DIAGNOSIS — M6281 Muscle weakness (generalized): Secondary | ICD-10-CM | POA: Diagnosis not present

## 2019-09-16 DIAGNOSIS — E7800 Pure hypercholesterolemia, unspecified: Secondary | ICD-10-CM | POA: Diagnosis not present

## 2019-09-18 ENCOUNTER — Other Ambulatory Visit: Payer: Self-pay | Admitting: Urology

## 2019-09-18 ENCOUNTER — Other Ambulatory Visit: Payer: Self-pay | Admitting: Family Medicine

## 2019-09-18 DIAGNOSIS — M810 Age-related osteoporosis without current pathological fracture: Secondary | ICD-10-CM

## 2019-09-18 DIAGNOSIS — Z1231 Encounter for screening mammogram for malignant neoplasm of breast: Secondary | ICD-10-CM

## 2019-09-18 DIAGNOSIS — M6281 Muscle weakness (generalized): Secondary | ICD-10-CM | POA: Diagnosis not present

## 2019-09-18 DIAGNOSIS — N3946 Mixed incontinence: Secondary | ICD-10-CM | POA: Diagnosis not present

## 2019-09-21 DIAGNOSIS — M6281 Muscle weakness (generalized): Secondary | ICD-10-CM | POA: Diagnosis not present

## 2019-09-21 DIAGNOSIS — N3946 Mixed incontinence: Secondary | ICD-10-CM | POA: Diagnosis not present

## 2019-09-23 DIAGNOSIS — M6281 Muscle weakness (generalized): Secondary | ICD-10-CM | POA: Diagnosis not present

## 2019-09-23 DIAGNOSIS — N3946 Mixed incontinence: Secondary | ICD-10-CM | POA: Diagnosis not present

## 2019-09-24 DIAGNOSIS — M6281 Muscle weakness (generalized): Secondary | ICD-10-CM | POA: Diagnosis not present

## 2019-09-24 DIAGNOSIS — N3946 Mixed incontinence: Secondary | ICD-10-CM | POA: Diagnosis not present

## 2019-09-28 DIAGNOSIS — M6281 Muscle weakness (generalized): Secondary | ICD-10-CM | POA: Diagnosis not present

## 2019-09-28 DIAGNOSIS — N3946 Mixed incontinence: Secondary | ICD-10-CM | POA: Diagnosis not present

## 2019-09-29 DIAGNOSIS — N3946 Mixed incontinence: Secondary | ICD-10-CM | POA: Diagnosis not present

## 2019-09-29 DIAGNOSIS — M6281 Muscle weakness (generalized): Secondary | ICD-10-CM | POA: Diagnosis not present

## 2019-09-30 DIAGNOSIS — M6281 Muscle weakness (generalized): Secondary | ICD-10-CM | POA: Diagnosis not present

## 2019-09-30 DIAGNOSIS — N3946 Mixed incontinence: Secondary | ICD-10-CM | POA: Diagnosis not present

## 2019-10-07 DIAGNOSIS — M6281 Muscle weakness (generalized): Secondary | ICD-10-CM | POA: Diagnosis not present

## 2019-10-07 DIAGNOSIS — N3946 Mixed incontinence: Secondary | ICD-10-CM | POA: Diagnosis not present

## 2019-10-08 DIAGNOSIS — N3946 Mixed incontinence: Secondary | ICD-10-CM | POA: Diagnosis not present

## 2019-10-08 DIAGNOSIS — M6281 Muscle weakness (generalized): Secondary | ICD-10-CM | POA: Diagnosis not present

## 2019-10-08 DIAGNOSIS — J9601 Acute respiratory failure with hypoxia: Secondary | ICD-10-CM | POA: Diagnosis not present

## 2019-10-08 DIAGNOSIS — R41841 Cognitive communication deficit: Secondary | ICD-10-CM | POA: Diagnosis not present

## 2019-10-08 DIAGNOSIS — U071 COVID-19: Secondary | ICD-10-CM | POA: Diagnosis not present

## 2019-10-09 DIAGNOSIS — M6281 Muscle weakness (generalized): Secondary | ICD-10-CM | POA: Diagnosis not present

## 2019-10-09 DIAGNOSIS — N3946 Mixed incontinence: Secondary | ICD-10-CM | POA: Diagnosis not present

## 2019-10-12 DIAGNOSIS — Z970 Presence of artificial eye: Secondary | ICD-10-CM | POA: Diagnosis not present

## 2019-10-12 DIAGNOSIS — H401113 Primary open-angle glaucoma, right eye, severe stage: Secondary | ICD-10-CM | POA: Diagnosis not present

## 2019-10-12 DIAGNOSIS — H548 Legal blindness, as defined in USA: Secondary | ICD-10-CM | POA: Diagnosis not present

## 2019-10-13 DIAGNOSIS — M6281 Muscle weakness (generalized): Secondary | ICD-10-CM | POA: Diagnosis not present

## 2019-10-13 DIAGNOSIS — N3946 Mixed incontinence: Secondary | ICD-10-CM | POA: Diagnosis not present

## 2019-10-14 DIAGNOSIS — R41841 Cognitive communication deficit: Secondary | ICD-10-CM | POA: Diagnosis not present

## 2019-10-14 DIAGNOSIS — N3946 Mixed incontinence: Secondary | ICD-10-CM | POA: Diagnosis not present

## 2019-10-14 DIAGNOSIS — J9601 Acute respiratory failure with hypoxia: Secondary | ICD-10-CM | POA: Diagnosis not present

## 2019-10-14 DIAGNOSIS — M6281 Muscle weakness (generalized): Secondary | ICD-10-CM | POA: Diagnosis not present

## 2019-10-14 DIAGNOSIS — U071 COVID-19: Secondary | ICD-10-CM | POA: Diagnosis not present

## 2019-10-16 DIAGNOSIS — N3946 Mixed incontinence: Secondary | ICD-10-CM | POA: Diagnosis not present

## 2019-10-16 DIAGNOSIS — M6281 Muscle weakness (generalized): Secondary | ICD-10-CM | POA: Diagnosis not present

## 2019-10-20 DIAGNOSIS — M6281 Muscle weakness (generalized): Secondary | ICD-10-CM | POA: Diagnosis not present

## 2019-10-20 DIAGNOSIS — N3946 Mixed incontinence: Secondary | ICD-10-CM | POA: Diagnosis not present

## 2019-10-22 DIAGNOSIS — N3946 Mixed incontinence: Secondary | ICD-10-CM | POA: Diagnosis not present

## 2019-10-22 DIAGNOSIS — M6281 Muscle weakness (generalized): Secondary | ICD-10-CM | POA: Diagnosis not present

## 2019-10-23 DIAGNOSIS — N3946 Mixed incontinence: Secondary | ICD-10-CM | POA: Diagnosis not present

## 2019-10-23 DIAGNOSIS — M6281 Muscle weakness (generalized): Secondary | ICD-10-CM | POA: Diagnosis not present

## 2019-10-26 ENCOUNTER — Other Ambulatory Visit: Payer: Self-pay | Admitting: Urology

## 2019-10-26 DIAGNOSIS — M6281 Muscle weakness (generalized): Secondary | ICD-10-CM | POA: Diagnosis not present

## 2019-10-26 DIAGNOSIS — N3946 Mixed incontinence: Secondary | ICD-10-CM | POA: Diagnosis not present

## 2019-10-27 DIAGNOSIS — E1140 Type 2 diabetes mellitus with diabetic neuropathy, unspecified: Secondary | ICD-10-CM | POA: Diagnosis not present

## 2019-10-27 DIAGNOSIS — M5136 Other intervertebral disc degeneration, lumbar region: Secondary | ICD-10-CM | POA: Diagnosis not present

## 2019-10-27 DIAGNOSIS — I10 Essential (primary) hypertension: Secondary | ICD-10-CM | POA: Diagnosis not present

## 2019-10-27 DIAGNOSIS — N3281 Overactive bladder: Secondary | ICD-10-CM | POA: Diagnosis not present

## 2019-10-28 DIAGNOSIS — N3946 Mixed incontinence: Secondary | ICD-10-CM | POA: Diagnosis not present

## 2019-10-28 DIAGNOSIS — M6281 Muscle weakness (generalized): Secondary | ICD-10-CM | POA: Diagnosis not present

## 2019-10-29 DIAGNOSIS — N3946 Mixed incontinence: Secondary | ICD-10-CM | POA: Diagnosis not present

## 2019-10-29 DIAGNOSIS — M6281 Muscle weakness (generalized): Secondary | ICD-10-CM | POA: Diagnosis not present

## 2019-10-30 DIAGNOSIS — N3946 Mixed incontinence: Secondary | ICD-10-CM | POA: Diagnosis not present

## 2019-10-30 DIAGNOSIS — M6281 Muscle weakness (generalized): Secondary | ICD-10-CM | POA: Diagnosis not present

## 2019-11-06 DIAGNOSIS — M6281 Muscle weakness (generalized): Secondary | ICD-10-CM | POA: Diagnosis not present

## 2019-11-06 DIAGNOSIS — N3946 Mixed incontinence: Secondary | ICD-10-CM | POA: Diagnosis not present

## 2019-11-09 DIAGNOSIS — M6281 Muscle weakness (generalized): Secondary | ICD-10-CM | POA: Diagnosis not present

## 2019-11-09 DIAGNOSIS — N3946 Mixed incontinence: Secondary | ICD-10-CM | POA: Diagnosis not present

## 2019-11-10 DIAGNOSIS — N3946 Mixed incontinence: Secondary | ICD-10-CM | POA: Diagnosis not present

## 2019-11-10 DIAGNOSIS — M6281 Muscle weakness (generalized): Secondary | ICD-10-CM | POA: Diagnosis not present

## 2019-11-11 DIAGNOSIS — N3946 Mixed incontinence: Secondary | ICD-10-CM | POA: Diagnosis not present

## 2019-11-11 DIAGNOSIS — M6281 Muscle weakness (generalized): Secondary | ICD-10-CM | POA: Diagnosis not present

## 2019-11-13 DIAGNOSIS — M6281 Muscle weakness (generalized): Secondary | ICD-10-CM | POA: Diagnosis not present

## 2019-11-13 DIAGNOSIS — N3946 Mixed incontinence: Secondary | ICD-10-CM | POA: Diagnosis not present

## 2019-12-01 DIAGNOSIS — R351 Nocturia: Secondary | ICD-10-CM | POA: Diagnosis not present

## 2019-12-01 DIAGNOSIS — N201 Calculus of ureter: Secondary | ICD-10-CM | POA: Diagnosis not present

## 2019-12-01 DIAGNOSIS — R3915 Urgency of urination: Secondary | ICD-10-CM | POA: Diagnosis not present

## 2019-12-10 ENCOUNTER — Other Ambulatory Visit: Payer: Medicare Other

## 2019-12-10 ENCOUNTER — Ambulatory Visit: Payer: Medicare Other

## 2019-12-31 ENCOUNTER — Other Ambulatory Visit: Payer: Self-pay | Admitting: Urology

## 2020-01-13 IMAGING — DX DG KNEE 1-2V PORT*R*
2 series · 2 of 2 positions shown · non-contrast
Comparison: None.

CLINICAL DATA: Status post right knee replacement

EXAM:
PORTABLE RIGHT KNEE - 1-2 VIEW

[knee lat (1 of 2)]
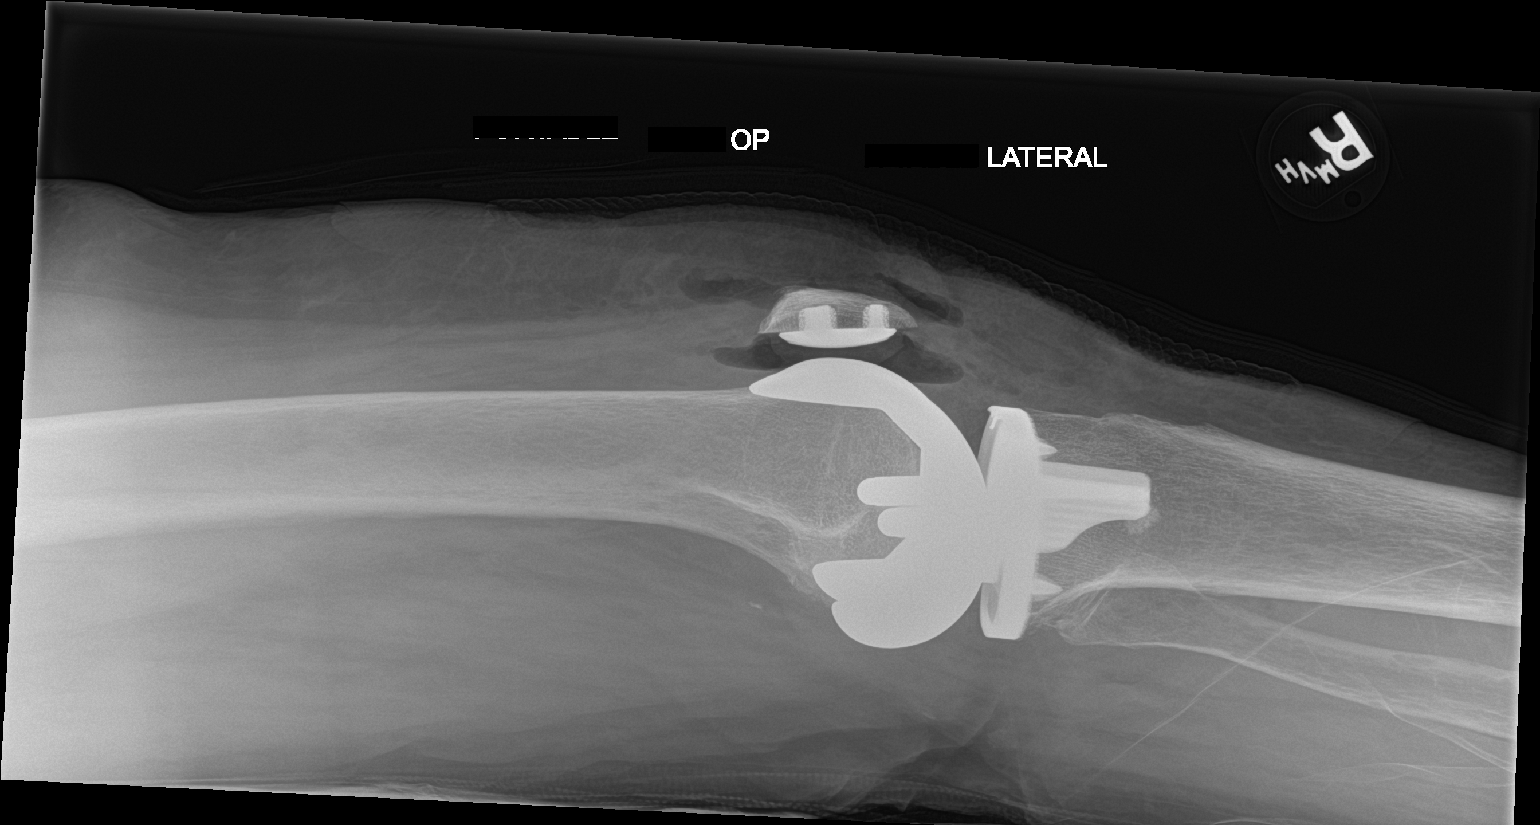

[knee lat (2 of 2)]
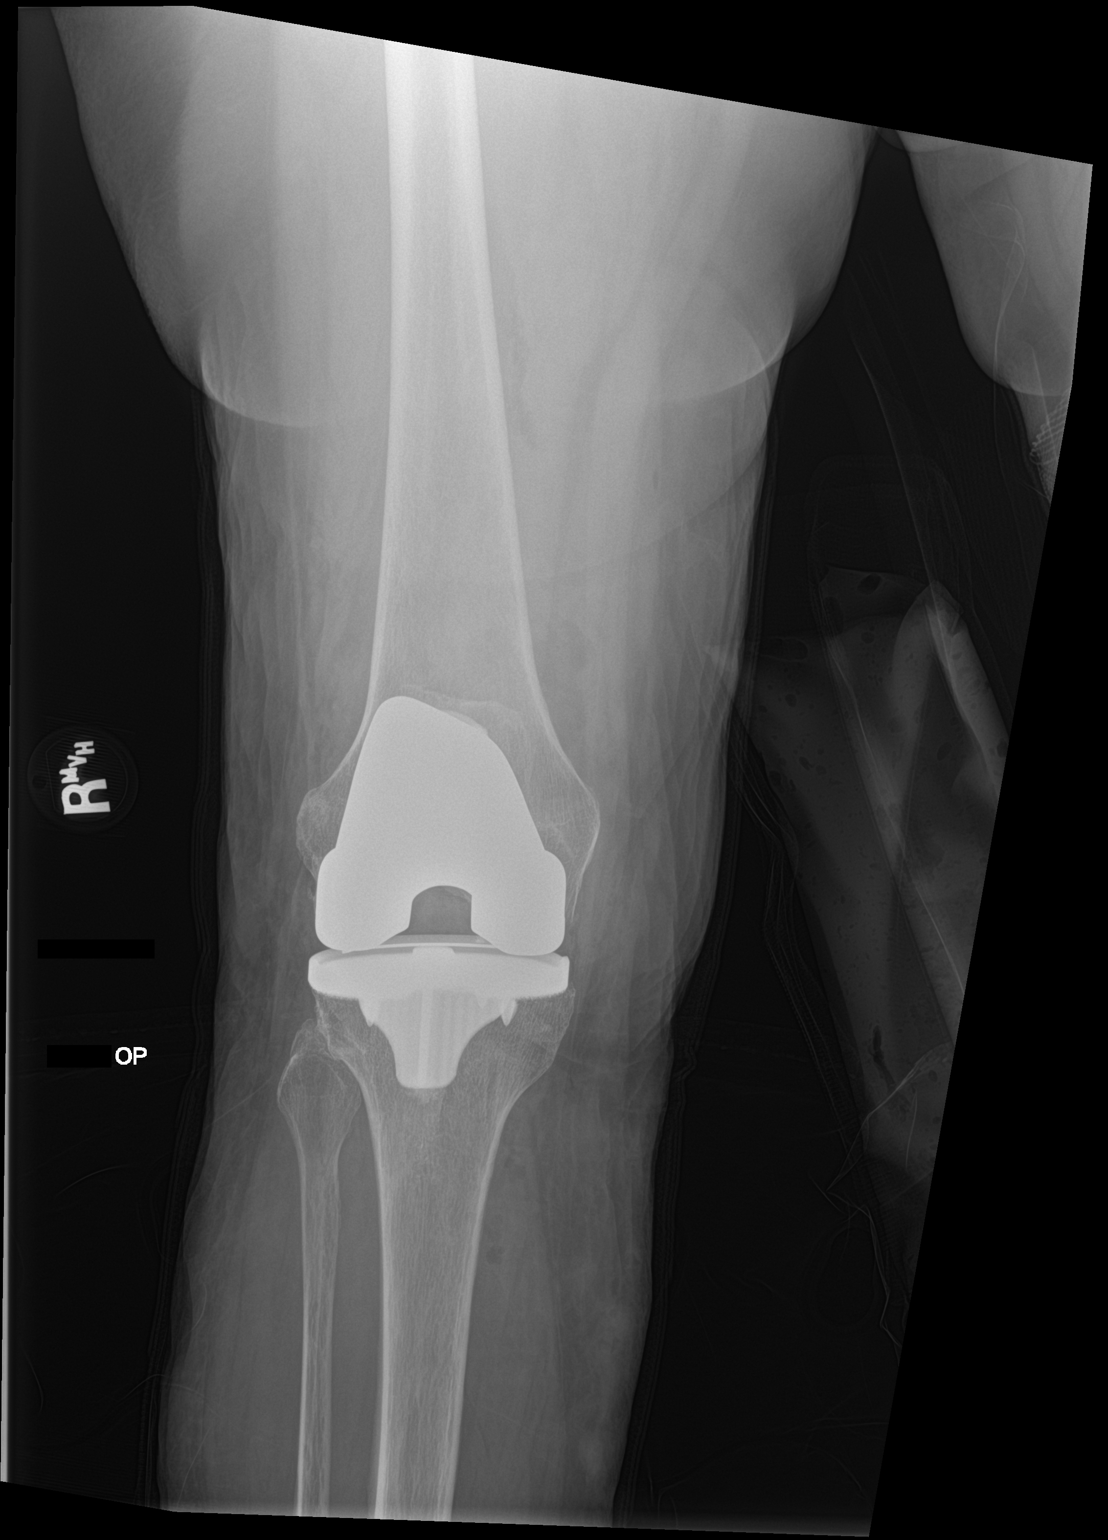

[2 of 2 positions shown; findings below may reference images not displayed]

FINDINGS: Right knee replacement is noted in satisfactory position. Air is
noted in the surgical bed. No acute bony or soft tissue abnormality
is seen.
IMPRESSION: Status post right knee replacement

## 2020-01-28 DIAGNOSIS — Z03818 Encounter for observation for suspected exposure to other biological agents ruled out: Secondary | ICD-10-CM | POA: Diagnosis not present

## 2020-01-28 DIAGNOSIS — Z20828 Contact with and (suspected) exposure to other viral communicable diseases: Secondary | ICD-10-CM | POA: Diagnosis not present

## 2020-03-23 ENCOUNTER — Other Ambulatory Visit: Payer: Medicare Other

## 2020-03-23 ENCOUNTER — Ambulatory Visit: Payer: Medicare Other

## 2020-04-13 ENCOUNTER — Other Ambulatory Visit: Payer: Self-pay | Admitting: Urology

## 2020-06-20 DIAGNOSIS — H401113 Primary open-angle glaucoma, right eye, severe stage: Secondary | ICD-10-CM | POA: Diagnosis not present

## 2020-06-28 ENCOUNTER — Other Ambulatory Visit: Payer: Medicare Other

## 2020-06-28 ENCOUNTER — Other Ambulatory Visit: Payer: Self-pay

## 2020-06-28 ENCOUNTER — Ambulatory Visit
Admission: RE | Admit: 2020-06-28 | Discharge: 2020-06-28 | Disposition: A | Discharge status: Home / Self Care | Payer: Medicare Other | Source: Ambulatory Visit | Attending: Family Medicine | Admitting: Family Medicine

## 2020-06-28 DIAGNOSIS — Z1231 Encounter for screening mammogram for malignant neoplasm of breast: Secondary | ICD-10-CM | POA: Diagnosis not present

## 2020-06-28 DIAGNOSIS — M6281 Muscle weakness (generalized): Secondary | ICD-10-CM | POA: Diagnosis not present

## 2020-06-29 DIAGNOSIS — M6281 Muscle weakness (generalized): Secondary | ICD-10-CM | POA: Diagnosis not present

## 2020-06-30 DIAGNOSIS — M6281 Muscle weakness (generalized): Secondary | ICD-10-CM | POA: Diagnosis not present

## 2020-07-05 DIAGNOSIS — M6281 Muscle weakness (generalized): Secondary | ICD-10-CM | POA: Diagnosis not present

## 2020-07-06 DIAGNOSIS — M6281 Muscle weakness (generalized): Secondary | ICD-10-CM | POA: Diagnosis not present

## 2020-07-07 DIAGNOSIS — M6281 Muscle weakness (generalized): Secondary | ICD-10-CM | POA: Diagnosis not present

## 2020-07-11 DIAGNOSIS — M6281 Muscle weakness (generalized): Secondary | ICD-10-CM | POA: Diagnosis not present

## 2020-07-12 DIAGNOSIS — M6281 Muscle weakness (generalized): Secondary | ICD-10-CM | POA: Diagnosis not present

## 2020-07-13 DIAGNOSIS — M6281 Muscle weakness (generalized): Secondary | ICD-10-CM | POA: Diagnosis not present

## 2020-07-14 DIAGNOSIS — M6281 Muscle weakness (generalized): Secondary | ICD-10-CM | POA: Diagnosis not present

## 2020-07-15 DIAGNOSIS — M6281 Muscle weakness (generalized): Secondary | ICD-10-CM | POA: Diagnosis not present

## 2020-07-18 DIAGNOSIS — M6281 Muscle weakness (generalized): Secondary | ICD-10-CM | POA: Diagnosis not present

## 2020-07-19 DIAGNOSIS — M6281 Muscle weakness (generalized): Secondary | ICD-10-CM | POA: Diagnosis not present

## 2020-07-20 DIAGNOSIS — M6281 Muscle weakness (generalized): Secondary | ICD-10-CM | POA: Diagnosis not present

## 2020-07-22 DIAGNOSIS — M6281 Muscle weakness (generalized): Secondary | ICD-10-CM | POA: Diagnosis not present

## 2020-07-25 DIAGNOSIS — M6281 Muscle weakness (generalized): Secondary | ICD-10-CM | POA: Diagnosis not present

## 2020-07-26 DIAGNOSIS — M6281 Muscle weakness (generalized): Secondary | ICD-10-CM | POA: Diagnosis not present

## 2020-07-27 DIAGNOSIS — M6281 Muscle weakness (generalized): Secondary | ICD-10-CM | POA: Diagnosis not present

## 2020-07-29 DIAGNOSIS — M6281 Muscle weakness (generalized): Secondary | ICD-10-CM | POA: Diagnosis not present

## 2020-08-01 DIAGNOSIS — M6281 Muscle weakness (generalized): Secondary | ICD-10-CM | POA: Diagnosis not present

## 2020-08-02 ENCOUNTER — Other Ambulatory Visit: Payer: Medicare Other

## 2020-08-02 DIAGNOSIS — M6281 Muscle weakness (generalized): Secondary | ICD-10-CM | POA: Diagnosis not present

## 2020-08-04 DIAGNOSIS — M6281 Muscle weakness (generalized): Secondary | ICD-10-CM | POA: Diagnosis not present

## 2020-08-05 DIAGNOSIS — M6281 Muscle weakness (generalized): Secondary | ICD-10-CM | POA: Diagnosis not present

## 2020-08-09 DIAGNOSIS — M6281 Muscle weakness (generalized): Secondary | ICD-10-CM | POA: Diagnosis not present

## 2020-08-10 DIAGNOSIS — M6281 Muscle weakness (generalized): Secondary | ICD-10-CM | POA: Diagnosis not present

## 2020-08-11 DIAGNOSIS — M6281 Muscle weakness (generalized): Secondary | ICD-10-CM | POA: Diagnosis not present

## 2020-08-12 DIAGNOSIS — M6281 Muscle weakness (generalized): Secondary | ICD-10-CM | POA: Diagnosis not present

## 2020-08-15 DIAGNOSIS — M6281 Muscle weakness (generalized): Secondary | ICD-10-CM | POA: Diagnosis not present

## 2020-08-15 DIAGNOSIS — H524 Presbyopia: Secondary | ICD-10-CM | POA: Diagnosis not present

## 2020-08-15 DIAGNOSIS — H5211 Myopia, right eye: Secondary | ICD-10-CM | POA: Diagnosis not present

## 2020-08-15 DIAGNOSIS — H52201 Unspecified astigmatism, right eye: Secondary | ICD-10-CM | POA: Diagnosis not present

## 2020-08-16 DIAGNOSIS — M6281 Muscle weakness (generalized): Secondary | ICD-10-CM | POA: Diagnosis not present

## 2020-08-18 DIAGNOSIS — M6281 Muscle weakness (generalized): Secondary | ICD-10-CM | POA: Diagnosis not present

## 2020-08-19 DIAGNOSIS — M6281 Muscle weakness (generalized): Secondary | ICD-10-CM | POA: Diagnosis not present

## 2020-08-22 DIAGNOSIS — M6281 Muscle weakness (generalized): Secondary | ICD-10-CM | POA: Diagnosis not present

## 2020-08-23 DIAGNOSIS — M6281 Muscle weakness (generalized): Secondary | ICD-10-CM | POA: Diagnosis not present

## 2020-08-24 DIAGNOSIS — M6281 Muscle weakness (generalized): Secondary | ICD-10-CM | POA: Diagnosis not present

## 2020-08-25 DIAGNOSIS — M6281 Muscle weakness (generalized): Secondary | ICD-10-CM | POA: Diagnosis not present

## 2020-08-26 DIAGNOSIS — M6281 Muscle weakness (generalized): Secondary | ICD-10-CM | POA: Diagnosis not present

## 2020-08-30 DIAGNOSIS — M6281 Muscle weakness (generalized): Secondary | ICD-10-CM | POA: Diagnosis not present

## 2020-09-02 DIAGNOSIS — M6281 Muscle weakness (generalized): Secondary | ICD-10-CM | POA: Diagnosis not present

## 2020-09-06 DIAGNOSIS — M6281 Muscle weakness (generalized): Secondary | ICD-10-CM | POA: Diagnosis not present

## 2020-09-13 DIAGNOSIS — M6281 Muscle weakness (generalized): Secondary | ICD-10-CM | POA: Diagnosis not present

## 2020-09-14 DIAGNOSIS — M6281 Muscle weakness (generalized): Secondary | ICD-10-CM | POA: Diagnosis not present

## 2020-09-15 DIAGNOSIS — M6281 Muscle weakness (generalized): Secondary | ICD-10-CM | POA: Diagnosis not present

## 2020-09-19 DIAGNOSIS — M6281 Muscle weakness (generalized): Secondary | ICD-10-CM | POA: Diagnosis not present

## 2020-09-21 DIAGNOSIS — R1084 Generalized abdominal pain: Secondary | ICD-10-CM | POA: Diagnosis not present

## 2020-09-21 DIAGNOSIS — N200 Calculus of kidney: Secondary | ICD-10-CM | POA: Diagnosis not present

## 2020-09-21 DIAGNOSIS — K8020 Calculus of gallbladder without cholecystitis without obstruction: Secondary | ICD-10-CM | POA: Diagnosis not present

## 2020-09-21 DIAGNOSIS — R1032 Left lower quadrant pain: Secondary | ICD-10-CM | POA: Diagnosis not present

## 2020-09-21 DIAGNOSIS — K760 Fatty (change of) liver, not elsewhere classified: Secondary | ICD-10-CM | POA: Diagnosis not present

## 2020-09-23 DIAGNOSIS — R1084 Generalized abdominal pain: Secondary | ICD-10-CM | POA: Diagnosis not present

## 2020-09-23 DIAGNOSIS — R109 Unspecified abdominal pain: Secondary | ICD-10-CM | POA: Diagnosis not present

## 2020-09-23 DIAGNOSIS — K760 Fatty (change of) liver, not elsewhere classified: Secondary | ICD-10-CM | POA: Diagnosis not present

## 2020-09-23 DIAGNOSIS — I700 Atherosclerosis of aorta: Secondary | ICD-10-CM | POA: Diagnosis not present

## 2020-09-23 DIAGNOSIS — K8020 Calculus of gallbladder without cholecystitis without obstruction: Secondary | ICD-10-CM | POA: Diagnosis not present

## 2020-09-26 ENCOUNTER — Other Ambulatory Visit: Payer: Self-pay | Admitting: Physician Assistant

## 2020-09-26 DIAGNOSIS — R1084 Generalized abdominal pain: Secondary | ICD-10-CM

## 2020-09-26 DIAGNOSIS — K8020 Calculus of gallbladder without cholecystitis without obstruction: Secondary | ICD-10-CM | POA: Diagnosis not present

## 2020-09-26 DIAGNOSIS — R1907 Generalized intra-abdominal and pelvic swelling, mass and lump: Secondary | ICD-10-CM | POA: Diagnosis not present

## 2020-09-26 DIAGNOSIS — R1900 Intra-abdominal and pelvic swelling, mass and lump, unspecified site: Secondary | ICD-10-CM

## 2020-09-26 DIAGNOSIS — R7401 Elevation of levels of liver transaminase levels: Secondary | ICD-10-CM | POA: Diagnosis not present

## 2020-09-26 DIAGNOSIS — K760 Fatty (change of) liver, not elsewhere classified: Secondary | ICD-10-CM | POA: Diagnosis not present

## 2020-09-28 DIAGNOSIS — M6281 Muscle weakness (generalized): Secondary | ICD-10-CM | POA: Diagnosis not present

## 2020-09-30 DIAGNOSIS — I700 Atherosclerosis of aorta: Secondary | ICD-10-CM | POA: Diagnosis not present

## 2020-09-30 DIAGNOSIS — R600 Localized edema: Secondary | ICD-10-CM | POA: Diagnosis not present

## 2020-09-30 DIAGNOSIS — E7800 Pure hypercholesterolemia, unspecified: Secondary | ICD-10-CM | POA: Diagnosis not present

## 2020-09-30 DIAGNOSIS — H409 Unspecified glaucoma: Secondary | ICD-10-CM | POA: Diagnosis not present

## 2020-09-30 DIAGNOSIS — Z7984 Long term (current) use of oral hypoglycemic drugs: Secondary | ICD-10-CM | POA: Diagnosis not present

## 2020-09-30 DIAGNOSIS — K219 Gastro-esophageal reflux disease without esophagitis: Secondary | ICD-10-CM | POA: Diagnosis not present

## 2020-09-30 DIAGNOSIS — Z0000 Encounter for general adult medical examination without abnormal findings: Secondary | ICD-10-CM | POA: Diagnosis not present

## 2020-09-30 DIAGNOSIS — I10 Essential (primary) hypertension: Secondary | ICD-10-CM | POA: Diagnosis not present

## 2020-09-30 DIAGNOSIS — E1140 Type 2 diabetes mellitus with diabetic neuropathy, unspecified: Secondary | ICD-10-CM | POA: Diagnosis not present

## 2020-09-30 DIAGNOSIS — Z1389 Encounter for screening for other disorder: Secondary | ICD-10-CM | POA: Diagnosis not present

## 2020-09-30 DIAGNOSIS — G4733 Obstructive sleep apnea (adult) (pediatric): Secondary | ICD-10-CM | POA: Diagnosis not present

## 2020-09-30 DIAGNOSIS — N3281 Overactive bladder: Secondary | ICD-10-CM | POA: Diagnosis not present

## 2020-09-30 DIAGNOSIS — M810 Age-related osteoporosis without current pathological fracture: Secondary | ICD-10-CM | POA: Diagnosis not present

## 2020-10-04 DIAGNOSIS — M6281 Muscle weakness (generalized): Secondary | ICD-10-CM | POA: Diagnosis not present

## 2020-10-05 ENCOUNTER — Other Ambulatory Visit: Payer: Self-pay | Admitting: Family Medicine

## 2020-10-05 DIAGNOSIS — M810 Age-related osteoporosis without current pathological fracture: Secondary | ICD-10-CM

## 2020-10-05 DIAGNOSIS — M6281 Muscle weakness (generalized): Secondary | ICD-10-CM | POA: Diagnosis not present

## 2020-10-10 DIAGNOSIS — M6281 Muscle weakness (generalized): Secondary | ICD-10-CM | POA: Diagnosis not present

## 2020-10-13 DIAGNOSIS — M6281 Muscle weakness (generalized): Secondary | ICD-10-CM | POA: Diagnosis not present

## 2020-10-17 ENCOUNTER — Ambulatory Visit
Admission: RE | Admit: 2020-10-17 | Discharge: 2020-10-17 | Disposition: A | Discharge status: Home / Self Care | Payer: Medicare Other | Source: Ambulatory Visit | Attending: Physician Assistant | Admitting: Physician Assistant

## 2020-10-17 DIAGNOSIS — K760 Fatty (change of) liver, not elsewhere classified: Secondary | ICD-10-CM | POA: Diagnosis not present

## 2020-10-17 DIAGNOSIS — R1900 Intra-abdominal and pelvic swelling, mass and lump, unspecified site: Secondary | ICD-10-CM

## 2020-10-17 DIAGNOSIS — K8020 Calculus of gallbladder without cholecystitis without obstruction: Secondary | ICD-10-CM | POA: Diagnosis not present

## 2020-10-17 DIAGNOSIS — R1084 Generalized abdominal pain: Secondary | ICD-10-CM

## 2020-10-17 DIAGNOSIS — N281 Cyst of kidney, acquired: Secondary | ICD-10-CM | POA: Diagnosis not present

## 2020-10-18 DIAGNOSIS — M6281 Muscle weakness (generalized): Secondary | ICD-10-CM | POA: Diagnosis not present

## 2020-10-19 DIAGNOSIS — M6281 Muscle weakness (generalized): Secondary | ICD-10-CM | POA: Diagnosis not present

## 2020-10-26 DIAGNOSIS — M6281 Muscle weakness (generalized): Secondary | ICD-10-CM | POA: Diagnosis not present

## 2020-10-27 DIAGNOSIS — M6281 Muscle weakness (generalized): Secondary | ICD-10-CM | POA: Diagnosis not present

## 2020-11-01 DIAGNOSIS — M6281 Muscle weakness (generalized): Secondary | ICD-10-CM | POA: Diagnosis not present

## 2020-11-02 DIAGNOSIS — M6281 Muscle weakness (generalized): Secondary | ICD-10-CM | POA: Diagnosis not present

## 2020-11-07 DIAGNOSIS — M6281 Muscle weakness (generalized): Secondary | ICD-10-CM | POA: Diagnosis not present

## 2020-11-09 DIAGNOSIS — M6281 Muscle weakness (generalized): Secondary | ICD-10-CM | POA: Diagnosis not present

## 2020-11-14 DIAGNOSIS — M6281 Muscle weakness (generalized): Secondary | ICD-10-CM | POA: Diagnosis not present

## 2020-11-16 ENCOUNTER — Other Ambulatory Visit: Payer: Self-pay | Admitting: Urology

## 2020-11-24 DIAGNOSIS — H401113 Primary open-angle glaucoma, right eye, severe stage: Secondary | ICD-10-CM | POA: Diagnosis not present

## 2020-12-09 DIAGNOSIS — R1907 Generalized intra-abdominal and pelvic swelling, mass and lump: Secondary | ICD-10-CM | POA: Diagnosis not present

## 2020-12-09 DIAGNOSIS — K760 Fatty (change of) liver, not elsewhere classified: Secondary | ICD-10-CM | POA: Diagnosis not present

## 2020-12-09 DIAGNOSIS — R1010 Upper abdominal pain, unspecified: Secondary | ICD-10-CM | POA: Diagnosis not present

## 2020-12-09 DIAGNOSIS — K8020 Calculus of gallbladder without cholecystitis without obstruction: Secondary | ICD-10-CM | POA: Diagnosis not present

## 2020-12-09 DIAGNOSIS — R7401 Elevation of levels of liver transaminase levels: Secondary | ICD-10-CM | POA: Diagnosis not present

## 2020-12-12 DIAGNOSIS — R7401 Elevation of levels of liver transaminase levels: Secondary | ICD-10-CM | POA: Diagnosis not present

## 2020-12-12 DIAGNOSIS — R1010 Upper abdominal pain, unspecified: Secondary | ICD-10-CM | POA: Diagnosis not present

## 2020-12-15 DIAGNOSIS — K2940 Chronic atrophic gastritis without bleeding: Secondary | ICD-10-CM | POA: Diagnosis not present

## 2020-12-15 DIAGNOSIS — R1013 Epigastric pain: Secondary | ICD-10-CM | POA: Diagnosis not present

## 2021-01-13 ENCOUNTER — Ambulatory Visit: Payer: Self-pay | Admitting: Surgery

## 2021-01-13 DIAGNOSIS — K8050 Calculus of bile duct without cholangitis or cholecystitis without obstruction: Secondary | ICD-10-CM | POA: Diagnosis not present

## 2021-01-13 NOTE — H&P (Signed)
Lindsey Baldwin   Referring Provider:  Orvis Brill, MD   Subjective   Chief Complaint: New Consultation (Gallbladder)     History of Present Illness: 75 year old woman with history of diabetes, osteoporosis, obstructive sleep apnea on CPAP, osteoarthritis, hypertension, GERD who has been having nearly daily upper abdominal pain and nausea for about 4 months.  This often comes on after eating.  She denies any vomiting but has felt like she was about to many times.  Reports that her bowel movements have been less regular as well.  No previous abdominal surgery.  She has undergone work-up including CT scan, upper endoscopy (per patient's report was unremarkable) and ultrasound which does demonstrate gallstones and hepatic steatosis.   Review of Systems: A complete review of systems was obtained from the patient.  I have reviewed this information and discussed as appropriate with the patient.  See HPI as well for other ROS.   Medical History: Past Medical History:  Diagnosis Date   Arthritis    Diabetes mellitus without complication (CMS-HCC)    Glaucoma (increased eye pressure)    Sleep apnea     There is no problem list on file for this patient.   Past Surgical History:  Procedure Laterality Date   TONSILLECTOMY       Allergies  Allergen Reactions   Penicillins Swelling and Other (See Comments)    Has patient had a PCN reaction causing immediate rash, facial/tongue/throat swelling, SOB or lightheadedness with hypotension: YES Has patient had a PCN reaction causing severe rash involving mucus membranes or skin necrosis: NO Has patient had a PCN reaction that required hospitalization: NO Has patient had a PCN reaction occurring within the last 10 years: NO If all of the above answers are "NO", then may proceed with Cephalosporin use.    Bimatoprost Itching   Dorzolamide-Timolol Itching   Sulfa (Sulfonamide Antibiotics) Rash    Current Outpatient  Medications on File Prior to Visit  Medication Sig Dispense Refill   famotidine (PEPCID) 20 MG tablet Take 20 mg by mouth once daily     insulin ASPART (NOVOLOG) injection (concentration 100 units/mL) 0-15 Units, Subcutaneous, 3 times daily with meals CBG < 70: Implement Hypoglycemia protocol CBG 70 - 120: 0 units CBG 121 - 150: 2 units CBG 151 - 200: 3 units CBG 201 - 250: 5 units CBG 251 - 300: 8 units CBG 301 - 350: 11 units CBG 351 - 400: 15 units CBG > 400: call MD     latanoprost (XALATAN) 0.005 % ophthalmic solution latanoprost 0.005 % eye drops     metoprolol tartrate (LOPRESSOR) 25 MG tablet Take 12.5 mg by mouth 3 (three) times daily as needed     mirabegron (MYRBETRIQ) 50 mg ER tablet Myrbetriq 50 mg tablet,extended release     tamsulosin (FLOMAX) 0.4 mg capsule Take 1 capsule by mouth once daily     timoloL maleate (TIMOPTIC) 0.5 % ophthalmic solution timolol maleate 0.5 % eye drops     acetaminophen (TYLENOL) 500 MG tablet Take by mouth     ALPRAZolam (XANAX) 0.5 MG tablet alprazolam 0.5 mg tablet     aspirin 81 MG chewable tablet Take 81 mg by mouth once daily     atorvastatin (LIPITOR) 40 MG tablet atorvastatin 40 mg tablet     calcium carbonate (TUMS) 200 mg calcium (500 mg) chewable tablet Take by mouth     enalapril (VASOTEC) 10 MG tablet      metFORMIN (GLUCOPHAGE) 1000  MG tablet metformin 1,000 mg tablet     multivitamin with minerals tablet Take 1 tablet by mouth once daily     ondansetron (ZOFRAN) 8 MG tablet      pantoprazole (PROTONIX) 40 MG DR tablet      SITagliptin (JANUVIA) 100 MG tablet Take 100 mg by mouth every morning     No current facility-administered medications on file prior to visit.    History reviewed. No pertinent family history.   Social History   Tobacco Use  Smoking Status Never Smoker  Smokeless Tobacco Never Used     Social History   Socioeconomic History   Marital status: Single  Tobacco Use   Smoking status: Never Smoker    Smokeless tobacco: Never Used  Scientific laboratory technician Use: Never used  Substance and Sexual Activity   Alcohol use: Never   Drug use: Never    Objective:    Vitals:   01/13/21 1021  BP: (!) 140/90  Pulse: 98  Temp: 36.9 C (98.5 F)  SpO2: 99%  Weight: 85.5 kg (188 lb 9.6 oz)  Height: 157.5 cm (5\' 2" )    Body mass index is 34.5 kg/m.  Alert, calm Unlabored respirations Abdomen is obese, distended, mildly tender in the epigastrium and right upper quad  Assessment and Plan:  Diagnoses and all orders for this visit:  Biliary colic -     CCS Case Posting Request; Future    I recommend proceeding with laparoscopic or robotic cholecystectomy with possible cholangiogram.  Discussed the relevant anatomy using a diagram to demonstrate, and went over surgical technique.  Discussed risks of surgery including bleeding, pain, scarring, intraabdominal injury specifically to the common bile duct and sequelae, bile leak, conversion to open surgery, failure to resolve symptoms, blood clots/ pulmonary embolus, heart attack, pneumonia, stroke, death. Questions welcomed and answered to patient's satisfaction.  She does have her power of attorney with her here today who requests that she be involved with the scheduling process.  Shaletha Humble Raquel James, MD

## 2021-01-17 DIAGNOSIS — R1084 Generalized abdominal pain: Secondary | ICD-10-CM | POA: Diagnosis not present

## 2021-01-17 DIAGNOSIS — K760 Fatty (change of) liver, not elsewhere classified: Secondary | ICD-10-CM | POA: Diagnosis not present

## 2021-01-17 DIAGNOSIS — K8020 Calculus of gallbladder without cholecystitis without obstruction: Secondary | ICD-10-CM | POA: Diagnosis not present

## 2021-01-17 DIAGNOSIS — K2940 Chronic atrophic gastritis without bleeding: Secondary | ICD-10-CM | POA: Diagnosis not present

## 2021-01-17 DIAGNOSIS — R7401 Elevation of levels of liver transaminase levels: Secondary | ICD-10-CM | POA: Diagnosis not present

## 2021-01-24 NOTE — Progress Notes (Signed)
DUE TO COVID-19 ONLY ONE VISITOR IS ALLOWED TO COME WITH YOU AND STAY IN THE WAITING ROOM ONLY DURING PRE OP AND PROCEDURE DAY OF SURGERY. THE 1 VISITOR  MAY VISIT WITH YOU AFTER SURGERY IN YOUR PRIVATE ROOM DURING VISITING HOURS ONLY!  YOU NEED TO HAVE A COVID 19 TEST ON_______ @_______ , THIS TEST MUST BE DONE BEFORE SURGERY,  COVID TESTING SITE IS AT [facility]. PLEASE REMAIN IN YOUR CAR THIS IS A DRIVER UP TEST. AFTER YOUR COVID TEST PLEASE WEAR A MASK OUT IN PUBLIC AND SOCIAL DISTANCE AND [place] YOUR HANDS FREQUENTLY. PLEASE ASK ALL YOUR CLOSE CONTACTS TO WEAR A MASK OUT IN PUBLIC AND SOCIAL DISTANCE AND [place] HANDS FREQUENTLY ALSO.               Lindsey Baldwin  01/24/2021   Your procedure is scheduled on:       02/14/2021   Report to [facility] Main  Entrance   Report to admitting at     [phone]     Call this number if you have problems the morning of surgery [phone]    Remember: Do not eat food , candy gum or mints :After Midnight. You may have clear liquids from midnight until __ 0730AM    CLEAR LIQUID DIET   Foods Allowed                                                                       Coffee and tea, regular and decaf                              Plain Jell-O any favor except red or purple                                            Fruit ices (not with fruit pulp)                                      Iced Popsicles                                     Carbonated beverages, regular and diet                                    Cranberry, grape and apple juices Sports drinks like Gatorade Lightly seasoned clear broth or consume(fat free) Sugar   _____________________________________________________________________    BRUSH YOUR TEETH MORNING OF SURGERY AND RINSE YOUR MOUTH OUT, NO CHEWING GUM CANDY OR MINTS.     Take these medicines the morning of surgery with A SIP OF WATER:   MYRBETRIQ, PROTONIX, EYE DROPS AS USUAL, VESICARE   DO NOT TAKE ANY DIABETIC MEDICATIONS DAY OF YOUR SURGERY  You may not have any metal on your body including hair pins and              piercings  Do not wear jewelry, make-up, lotions, powders or perfumes, deodorant             Do not wear nail polish on your fingernails.  Do not shave  48 hours prior to surgery.              Men may shave face and neck.   Do not bring valuables to the hospital. [place].  Contacts, dentures or bridgework may not be worn into surgery.  Leave suitcase in the car. After surgery it may be brought to your room.     Patients discharged the day of surgery will not be allowed to drive home. IF YOU ARE HAVING SURGERY AND GOING HOME THE SAME DAY, YOU MUST HAVE AN ADULT TO DRIVE YOU HOME AND BE WITH YOU FOR 24 HOURS. YOU MAY GO HOME BY TAXI OR UBER OR ORTHERWISE, BUT AN ADULT MUST ACCOMPANY YOU HOME AND STAY WITH YOU FOR 24 HOURS.  Name and phone number of your driver:  Special Instructions: N/A              Please read over the following fact sheets you were given: _____________________________________________________________________  [facility] - Preparing for Surgery Before surgery, you can play an important role.  Because skin is not sterile, your skin needs to be as free of germs as possible.  You can reduce the number of germs on your skin by washing with CHG (chlorahexidine gluconate) soap before surgery.  CHG is an antiseptic cleaner which kills germs and bonds with the skin to continue killing germs even after washing. Please DO NOT use if you have an allergy to CHG or antibacterial soaps.  If your skin becomes reddened/irritated stop using the CHG and inform your nurse when you arrive at Short Stay. Do not shave (including legs and underarms) for at least 48 hours prior to the first CHG shower.  You may shave your face/neck. Please follow these instructions carefully:  1.   Shower with CHG Soap the night before surgery and the  morning of Surgery.  2.  If you choose to wash your hair, wash your hair first as usual with your  normal  shampoo.  3.  After you shampoo, rinse your hair and body thoroughly to remove the  shampoo.                           4.  Use CHG as you would any other liquid soap.  You can apply chg directly  to the skin and wash                       Gently with a scrungie or clean washcloth.  5.  Apply the CHG Soap to your body ONLY FROM THE NECK DOWN.   Do not use on face/ open                           Wound or open sores. Avoid contact with eyes, ears mouth and genitals (private parts).  Wash face,  Genitals (private parts) with your normal soap.             6.  Wash thoroughly, paying special attention to the area where your surgery  will be performed.  7.  Thoroughly rinse your body with warm water from the neck down.  8.  DO NOT shower/wash with your normal soap after using and rinsing off  the CHG Soap.                9.  Pat yourself dry with a clean towel.            10.  Wear clean pajamas.            11.  Place clean sheets on your bed the night of your first shower and do not  sleep with pets. Day of Surgery : Do not apply any lotions/deodorants the morning of surgery.  Please wear clean clothes to the hospital/surgery center.  FAILURE TO FOLLOW THESE INSTRUCTIONS MAY RESULT IN THE CANCELLATION OF YOUR SURGERY PATIENT SIGNATURE_________________________________  NURSE SIGNATURE__________________________________  ________________________________________________________________________

## 2021-01-24 NOTE — Progress Notes (Addendum)
Anesthesia Review:  PCP: DR Pershing Cox Lov 01/2020 per pt  Cardiologist : Chest x-ray : EKG : Echo : Stress test: Cardiac Cath :  Activity level:  Sleep Study/ CPAP : Fasting Blood Sugar :      / Checks Blood Sugar -- times a day:   Blood Thinner/ Instructions /Last Dose: ASA / Instructions/ Last Dose :   DM- type 2  Hgba1c-01/26/21- 8.8 routed to DR Kae Heller on 01/27/21 Checks glucose daily at home.   PT reports at preop being recently taken off of Januvia and never put on another diabetic med.  Does take Metformin.  PT reports glucose being " in the 200s at home when I check".  Instructed pt to call PCP today and inform of blood glucose readings and she has upcoming surgery along with labs and ekg being done today.  PT stated she would call PCP today after leaving preop appt.  No covid test- ambulatory surgery. Armando Reichert aware on 01/26/21.  BMP done 01/26/21 routed to Dr Kae Heller.

## 2021-01-26 ENCOUNTER — Encounter (HOSPITAL_COMMUNITY): Payer: Self-pay

## 2021-01-26 ENCOUNTER — Encounter (HOSPITAL_COMMUNITY)
Admission: RE | Admit: 2021-01-26 | Discharge: 2021-01-26 | Disposition: A | Discharge status: Home / Self Care | Payer: Medicare Other | Source: Ambulatory Visit | Attending: Surgery | Admitting: Surgery

## 2021-01-26 ENCOUNTER — Other Ambulatory Visit: Payer: Self-pay

## 2021-01-26 VITALS — BP 157/77 | HR 83 | Temp 98.9°F | Resp 16 | Ht 62.0 in | Wt 183.0 lb

## 2021-01-26 DIAGNOSIS — Z01818 Encounter for other preprocedural examination: Secondary | ICD-10-CM | POA: Insufficient documentation

## 2021-01-26 DIAGNOSIS — E119 Type 2 diabetes mellitus without complications: Secondary | ICD-10-CM | POA: Insufficient documentation

## 2021-01-26 HISTORY — DX: Nausea with vomiting, unspecified: R11.2

## 2021-01-26 HISTORY — DX: Other specified postprocedural states: Z98.890

## 2021-01-26 HISTORY — DX: Personal history of urinary calculi: Z87.442

## 2021-01-26 LAB — HEMOGLOBIN A1C
Hgb A1c MFr Bld: 8.8 % — ABNORMAL HIGH (ref 4.8–5.6)
Mean Plasma Glucose: 205.86 mg/dL

## 2021-01-26 LAB — BASIC METABOLIC PANEL
Anion gap: 7 (ref 5–15)
BUN: 19 mg/dL (ref 8–23)
CO2: 22 mmol/L (ref 22–32)
Calcium: 8.9 mg/dL (ref 8.9–10.3)
Chloride: 105 mmol/L (ref 98–111)
Creatinine, Ser: 1.14 mg/dL — ABNORMAL HIGH (ref 0.44–1.00)
GFR, Estimated: 51 mL/min — ABNORMAL LOW (ref >60)
Glucose, Bld: 281 mg/dL — ABNORMAL HIGH (ref 70–99)
Potassium: 4.5 mmol/L (ref 3.5–5.1)
Sodium: 134 mmol/L — ABNORMAL LOW (ref 135–145)

## 2021-01-26 LAB — CBC
HCT: 39.2 % (ref 36.0–46.0)
Hemoglobin: 13 g/dL (ref 12.0–15.0)
MCH: 30.6 pg (ref 26.0–34.0)
MCHC: 33.2 g/dL (ref 30.0–36.0)
MCV: 92.2 fL (ref 80.0–100.0)
Platelets: 162 10*3/uL (ref 150–400)
RBC: 4.25 MIL/uL (ref 3.87–5.11)
RDW: 13.2 % (ref 11.5–15.5)
WBC: 7.4 10*3/uL (ref 4.0–10.5)
nRBC: 0 % (ref 0.0–0.2)

## 2021-01-26 LAB — GLUCOSE, CAPILLARY: Glucose-Capillary: 282 mg/dL — ABNORMAL HIGH (ref 70–99)

## 2021-02-02 NOTE — Progress Notes (Signed)
Anesthesia Chart Review   Case: [id number] Date/Time: 02/14/21 1330   Procedure: XI ROBOTIC ASSISTED LAPAROSCOPIC CHOLECYSTECTOMY   Anesthesia type: General   Pre-op diagnosis: BILIARY COLIC   Location[facility] ROOM 02 / [facility]   Surgeons: Clovis Riley, MD       DISCUSSION:75 y.o. never smoker with h/o PONV, GERD, HTN, DM II (A1C 8.8), OSA on cpap, biliary colic scheduled for above procedure 02/14/2021 with Dr. Romana Juniper.   A1C 8.8, forwarded to Dr. Kae Heller.  Pt recently had some changes made to diabetes medications, advised by PAT nurse to contact PCP.   VS: BP (!) 157/77   Pulse 83   Temp 37.2 C (Oral)   Resp 16   Ht 5\' 2"  (1.575 m)   Wt 83 kg   SpO2 98%   BMI 33.47 kg/m   PROVIDERS: Donald Prose, MD is PCP    LABS: Labs reviewed: Acceptable for surgery. (all labs ordered are listed, but only abnormal results are displayed)  Labs Reviewed  HEMOGLOBIN A1C - Abnormal; Notable for the following components:      Result Value   Hgb A1c MFr Bld 8.8 (*)    All other components within normal limits  BASIC METABOLIC PANEL - Abnormal; Notable for the following components:   Sodium 134 (*)    Glucose, Bld 281 (*)    Creatinine, Ser 1.14 (*)    GFR, Estimated 51 (*)    All other components within normal limits  GLUCOSE, CAPILLARY - Abnormal; Notable for the following components:   Glucose-Capillary 282 (*)    All other components within normal limits  CBC     IMAGES:   EKG: 01/26/2021 Rate 81 bpm  NSR Poor R wave progression  No significant change since last tracing  CV:  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   Bronchitis    GERD (gastroesophageal reflux disease)    Glaucoma, right eye    CLOSED ANGLE   History of kidney stones    Hypertension    OA (osteoarthritis)    KNEES , HANDS   OAB (overactive bladder)    OSA on CPAP    Osteoporosis    PONV (postoperative nausea and vomiting)    Type 2 diabetes  mellitus ([place])    FOLLOWED BY PCP   Wears glasses     Past Surgical History:  Procedure Laterality Date   CYST EXCISION  08/2017   POSTERIOR NECK   CYSTOSCOPY WITH RETROGRADE PYELOGRAM, URETEROSCOPY AND STENT PLACEMENT Left 06/16/2018   Procedure: diagnostic URETEROSCOPY AND STENT PLACEMENT;  Surgeon: Cleon Gustin, MD;  Location: [facility];  Service: Urology;  Laterality: Left;  1 HR   EYE SURGERY Left 1992   REMOVE EYE AND PLACEMENT PROSTHESIS   KNEE ARTHROPLASTY Right 12/12/2017   Procedure: RIGHT TOTAL KNEE ARTHROPLASTY WITH COMPUTER NAVIGATION;  Surgeon: Rod Can, MD;  Location: [facility];  Service: Orthopedics;  Laterality: Right;  Needs RNFA   TONSILLECTOMY  AGE 18    MEDICATIONS:  acetaminophen (TYLENOL) 500 MG tablet   aspirin 81 MG chewable tablet   atorvastatin (LIPITOR) 40 MG tablet   calcium carbonate (TUMS - DOSED IN MG ELEMENTAL CALCIUM) 500 MG chewable tablet   enalapril (VASOTEC) 10 MG tablet   famotidine (PEPCID) 20 MG tablet   furosemide (LASIX) 20 MG tablet   ibuprofen (ADVIL,MOTRIN) 200 MG tablet   insulin aspart (NOVOLOG) 100 UNIT/ML injection  latanoprost (XALATAN) 0.005 % ophthalmic solution   loratadine (CLARITIN) 10 MG tablet   metFORMIN (GLUCOPHAGE-XR) 500 MG 24 hr tablet   metoprolol tartrate (LOPRESSOR) 25 MG tablet   Multiple Vitamins-Minerals (MULTIVITAMIN WITH MINERALS) tablet   MYRBETRIQ 50 MG TB24 tablet   ondansetron (ZOFRAN) 8 MG tablet   pantoprazole (PROTONIX) 40 MG tablet   pilocarpine (PILOCAR) 1 % ophthalmic solution   solifenacin (VESICARE) 5 MG tablet   tamsulosin (FLOMAX) 0.4 MG CAPS capsule   timolol (BETIMOL) 0.5 % ophthalmic solution   No current facility-administered medications for this encounter.    Konrad Felix Ward, PA-C [facility] [phone]

## 2021-02-14 ENCOUNTER — Encounter (HOSPITAL_COMMUNITY): Payer: Self-pay | Admitting: Surgery

## 2021-02-14 ENCOUNTER — Ambulatory Visit (HOSPITAL_COMMUNITY): Payer: Medicare Other | Admitting: Physician Assistant

## 2021-02-14 ENCOUNTER — Ambulatory Visit (HOSPITAL_COMMUNITY): Payer: Medicare Other | Admitting: Anesthesiology

## 2021-02-14 ENCOUNTER — Encounter (HOSPITAL_COMMUNITY)
Admission: RE | Disposition: A | Discharge status: Home / Self Care | Payer: Self-pay | Source: Home / Self Care | Attending: Surgery

## 2021-02-14 ENCOUNTER — Ambulatory Visit (HOSPITAL_COMMUNITY)
Admission: RE | Admit: 2021-02-14 | Discharge: 2021-02-14 | Disposition: A | Discharge status: Home / Self Care | Payer: Medicare Other | Attending: Surgery | Admitting: Surgery

## 2021-02-14 DIAGNOSIS — G4733 Obstructive sleep apnea (adult) (pediatric): Secondary | ICD-10-CM | POA: Insufficient documentation

## 2021-02-14 DIAGNOSIS — K8064 Calculus of gallbladder and bile duct with chronic cholecystitis without obstruction: Secondary | ICD-10-CM | POA: Diagnosis not present

## 2021-02-14 DIAGNOSIS — K219 Gastro-esophageal reflux disease without esophagitis: Secondary | ICD-10-CM | POA: Insufficient documentation

## 2021-02-14 DIAGNOSIS — I10 Essential (primary) hypertension: Secondary | ICD-10-CM | POA: Insufficient documentation

## 2021-02-14 DIAGNOSIS — K760 Fatty (change of) liver, not elsewhere classified: Secondary | ICD-10-CM | POA: Diagnosis not present

## 2021-02-14 DIAGNOSIS — E119 Type 2 diabetes mellitus without complications: Secondary | ICD-10-CM | POA: Diagnosis not present

## 2021-02-14 DIAGNOSIS — K8044 Calculus of bile duct with chronic cholecystitis without obstruction: Secondary | ICD-10-CM | POA: Diagnosis not present

## 2021-02-14 DIAGNOSIS — Z9989 Dependence on other enabling machines and devices: Secondary | ICD-10-CM | POA: Diagnosis not present

## 2021-02-14 DIAGNOSIS — K8010 Calculus of gallbladder with chronic cholecystitis without obstruction: Secondary | ICD-10-CM | POA: Diagnosis not present

## 2021-02-14 LAB — GLUCOSE, CAPILLARY
Glucose-Capillary: 174 mg/dL — ABNORMAL HIGH (ref 70–99)
Glucose-Capillary: 192 mg/dL — ABNORMAL HIGH (ref 70–99)

## 2021-02-14 SURGERY — CHOLECYSTECTOMY, ROBOT-ASSISTED, LAPAROSCOPIC
Anesthesia: General

## 2021-02-14 MED ORDER — ROCURONIUM BROMIDE 10 MG/ML (PF) SYRINGE
PREFILLED_SYRINGE | INTRAVENOUS | Status: DC | PRN
  Administered 2021-02-14: 50 mg via INTRAVENOUS

## 2021-02-14 MED ORDER — BUPIVACAINE-EPINEPHRINE (PF) 0.25% -1:200000 IJ SOLN
INTRAMUSCULAR | Status: AC
  Filled 2021-02-14: qty 30

## 2021-02-14 MED ORDER — SUGAMMADEX SODIUM 200 MG/2ML IV SOLN
INTRAVENOUS | Status: DC | PRN
Start: 2021-02-14 — End: 2021-02-14
  Administered 2021-02-14: 200 mg via INTRAVENOUS

## 2021-02-14 MED ORDER — TRAMADOL HCL 50 MG PO TABS: 50.0000 mg | ORAL_TABLET | Freq: Four times a day (QID) | ORAL | 0 refills | Status: AC | PRN

## 2021-02-14 MED ORDER — ACETAMINOPHEN 650 MG RE SUPP
650.0000 mg | RECTAL | Status: DC | PRN
  Filled 2021-02-14: qty 1

## 2021-02-14 MED ORDER — FENTANYL CITRATE (PF) 250 MCG/5ML IJ SOLN
INTRAMUSCULAR | Status: AC
  Filled 2021-02-14: qty 5

## 2021-02-14 MED ORDER — PROPOFOL 10 MG/ML IV BOLUS
INTRAVENOUS | Status: DC | PRN
  Administered 2021-02-14: 10 mg via INTRAVENOUS

## 2021-02-14 MED ORDER — CHLORHEXIDINE GLUCONATE 4 % EX LIQD: 60.0000 mL | Freq: Once | CUTANEOUS | Status: DC

## 2021-02-14 MED ORDER — BUPIVACAINE LIPOSOME 1.3 % IJ SUSP: 20.0000 mL | Freq: Once | INTRAMUSCULAR | Status: DC

## 2021-02-14 MED ORDER — INDOCYANINE GREEN 25 MG IV SOLR
2.5000 mg | Freq: Once | INTRAVENOUS | Status: AC
  Administered 2021-02-14: 2.5 mg via INTRAVENOUS
  Filled 2021-02-14: qty 1

## 2021-02-14 MED ORDER — ONDANSETRON HCL 4 MG/2ML IJ SOLN
INTRAMUSCULAR | Status: DC | PRN
  Administered 2021-02-14: 4 mg via INTRAVENOUS

## 2021-02-14 MED ORDER — BUPIVACAINE LIPOSOME 1.3 % IJ SUSP
INTRAMUSCULAR | Status: DC | PRN
  Administered 2021-02-14: 20 mL

## 2021-02-14 MED ORDER — LIDOCAINE 2% (20 MG/ML) 5 ML SYRINGE
INTRAMUSCULAR | Status: DC | PRN
  Administered 2021-02-14: 60 mg via INTRAVENOUS

## 2021-02-14 MED ORDER — SODIUM CHLORIDE 0.9 % IV SOLN: 250.0000 mL | INTRAVENOUS | Status: DC | PRN

## 2021-02-14 MED ORDER — LACTATED RINGERS IV SOLN: INTRAVENOUS | Status: DC

## 2021-02-14 MED ORDER — PROPOFOL 10 MG/ML IV BOLUS
INTRAVENOUS | Status: AC
  Filled 2021-02-14: qty 20

## 2021-02-14 MED ORDER — OXYCODONE HCL 5 MG PO TABS: 5.0000 mg | ORAL_TABLET | ORAL | Status: DC | PRN

## 2021-02-14 MED ORDER — ORAL CARE MOUTH RINSE: 15.0000 mL | Freq: Once | OROMUCOSAL | Status: AC

## 2021-02-14 MED ORDER — FENTANYL CITRATE PF 50 MCG/ML IJ SOSY: 25.0000 ug | PREFILLED_SYRINGE | INTRAMUSCULAR | Status: DC | PRN

## 2021-02-14 MED ORDER — EPHEDRINE SULFATE-NACL 50-0.9 MG/10ML-% IV SOSY
PREFILLED_SYRINGE | INTRAVENOUS | Status: DC | PRN
Start: 2021-02-14 — End: 2021-02-14
  Administered 2021-02-14: 15 mg via INTRAVENOUS

## 2021-02-14 MED ORDER — HYDRALAZINE HCL 20 MG/ML IJ SOLN
INTRAMUSCULAR | Status: AC
  Filled 2021-02-14: qty 1

## 2021-02-14 MED ORDER — ACETAMINOPHEN 500 MG PO TABS
1000.0000 mg | ORAL_TABLET | ORAL | Status: AC
  Administered 2021-02-14: 1000 mg via ORAL
  Filled 2021-02-14: qty 2

## 2021-02-14 MED ORDER — CHLORHEXIDINE GLUCONATE 0.12 % MT SOLN
15.0000 mL | Freq: Once | OROMUCOSAL | Status: AC
  Administered 2021-02-14: 15 mL via OROMUCOSAL

## 2021-02-14 MED ORDER — 0.9 % SODIUM CHLORIDE (POUR BTL) OPTIME
TOPICAL | Status: DC | PRN
  Administered 2021-02-14: 1000 mL

## 2021-02-14 MED ORDER — SODIUM CHLORIDE 0.9% FLUSH: 3.0000 mL | Freq: Two times a day (BID) | INTRAVENOUS | Status: DC

## 2021-02-14 MED ORDER — ACETAMINOPHEN 325 MG PO TABS: 650.0000 mg | ORAL_TABLET | ORAL | Status: DC | PRN

## 2021-02-14 MED ORDER — BUPIVACAINE-EPINEPHRINE 0.25% -1:200000 IJ SOLN
INTRAMUSCULAR | Status: DC | PRN
  Administered 2021-02-14: 30 mL

## 2021-02-14 MED ORDER — OXYCODONE HCL 5 MG PO TABS: 5.0000 mg | ORAL_TABLET | Freq: Once | ORAL | Status: DC | PRN

## 2021-02-14 MED ORDER — AMISULPRIDE (ANTIEMETIC) 5 MG/2ML IV SOLN
INTRAVENOUS | Status: AC
  Filled 2021-02-14: qty 4

## 2021-02-14 MED ORDER — FENTANYL CITRATE (PF) 100 MCG/2ML IJ SOLN
INTRAMUSCULAR | Status: DC | PRN
  Administered 2021-02-14 (×4): 50 ug via INTRAVENOUS

## 2021-02-14 MED ORDER — HEMOSTATIC AGENTS (NO CHARGE) OPTIME
TOPICAL | Status: DC | PRN
  Administered 2021-02-14: 1 via TOPICAL

## 2021-02-14 MED ORDER — AMISULPRIDE (ANTIEMETIC) 5 MG/2ML IV SOLN
10.0000 mg | Freq: Once | INTRAVENOUS | Status: AC | PRN
  Administered 2021-02-14: 10 mg via INTRAVENOUS

## 2021-02-14 MED ORDER — VANCOMYCIN HCL IN DEXTROSE 1-5 GM/200ML-% IV SOLN
1000.0000 mg | INTRAVENOUS | Status: AC
  Administered 2021-02-14: 1000 mg via INTRAVENOUS
  Filled 2021-02-14: qty 200

## 2021-02-14 MED ORDER — DEXAMETHASONE SODIUM PHOSPHATE 10 MG/ML IJ SOLN
INTRAMUSCULAR | Status: DC | PRN
  Administered 2021-02-14: 5 mg via INTRAVENOUS

## 2021-02-14 MED ORDER — SODIUM CHLORIDE 0.9% FLUSH: 3.0000 mL | INTRAVENOUS | Status: DC | PRN

## 2021-02-14 MED ORDER — BUPIVACAINE LIPOSOME 1.3 % IJ SUSP
INTRAMUSCULAR | Status: AC
  Filled 2021-02-14: qty 20

## 2021-02-14 MED ORDER — FENTANYL CITRATE PF 50 MCG/ML IJ SOSY
PREFILLED_SYRINGE | INTRAMUSCULAR | Status: AC
  Filled 2021-02-14: qty 3

## 2021-02-14 MED ORDER — OXYCODONE HCL 5 MG/5ML PO SOLN: 5.0000 mg | Freq: Once | ORAL | Status: DC | PRN

## 2021-02-14 MED ORDER — HYDRALAZINE HCL 20 MG/ML IJ SOLN
INTRAMUSCULAR | Status: DC | PRN
  Administered 2021-02-14: 5 mg via INTRAVENOUS

## 2021-02-14 SURGICAL SUPPLY — 53 items
APL PRP STRL LF DISP 70% ISPRP (MISCELLANEOUS) ×1
APPLIER CLIP 5 13 M/L LIGAMAX5 (MISCELLANEOUS)
BAG COUNTER SPONGE SURGICOUNT (BAG) ×2 IMPLANT
BAG SURGICOUNT SPONGE COUNTING (BAG) ×1
BLADE SURG SZ11 CARB STEEL (BLADE) ×3 IMPLANT
CHLORAPREP W/TINT 26 (MISCELLANEOUS) ×3 IMPLANT
CLIP APPLIE 5 13 M/L LIGAMAX5 (MISCELLANEOUS) IMPLANT
CLIP LIGATING HEMO O LOK GREEN (MISCELLANEOUS) ×3 IMPLANT
COVER TIP SHEARS 8 DVNC (MISCELLANEOUS) ×1 IMPLANT
COVER TIP SHEARS 8MM DA VINCI (MISCELLANEOUS) ×3
DECANTER SPIKE VIAL GLASS SM (MISCELLANEOUS) ×3 IMPLANT
DERMABOND ADVANCED (GAUZE/BANDAGES/DRESSINGS) ×2
DERMABOND ADVANCED .7 DNX12 (GAUZE/BANDAGES/DRESSINGS) ×1 IMPLANT
DRAPE ARM DVNC X/XI (DISPOSABLE) ×4 IMPLANT
DRAPE COLUMN DVNC XI (DISPOSABLE) ×1 IMPLANT
DRAPE DA VINCI XI ARM (DISPOSABLE) ×12
DRAPE DA VINCI XI COLUMN (DISPOSABLE) ×3
ELECT REM PT RETURN 15FT ADLT (MISCELLANEOUS) ×3 IMPLANT
GLOVE SURG ENC MOIS LTX SZ6 (GLOVE) ×6 IMPLANT
GLOVE SURG MICRO LTX SZ6 (GLOVE) ×3 IMPLANT
GLOVE SURG UNDER LTX SZ6.5 (GLOVE) ×6 IMPLANT
GOWN STRL REUS W/TWL LRG LVL3 (GOWN DISPOSABLE) ×6 IMPLANT
GOWN STRL REUS W/TWL XL LVL3 (GOWN DISPOSABLE) IMPLANT
GRASPER SUT TROCAR 14GX15 (MISCELLANEOUS) ×3 IMPLANT
HEMOSTAT SNOW SURGICEL 2X4 (HEMOSTASIS) ×3 IMPLANT
IRRIG SUCT STRYKERFLOW 2 WTIP (MISCELLANEOUS)
IRRIGATION SUCT STRKRFLW 2 WTP (MISCELLANEOUS) IMPLANT
KIT BASIN OR (CUSTOM PROCEDURE TRAY) ×3 IMPLANT
KIT PROCEDURE DA VINCI SI (MISCELLANEOUS)
KIT PROCEDURE DVNC SI (MISCELLANEOUS) IMPLANT
KIT TURNOVER KIT A (KITS) IMPLANT
MANIFOLD NEPTUNE II (INSTRUMENTS) ×3 IMPLANT
NEEDLE HYPO 22GX1.5 SAFETY (NEEDLE) ×3 IMPLANT
NEEDLE INSUFFLATION 14GA 120MM (NEEDLE) ×3 IMPLANT
PACK CARDIOVASCULAR III (CUSTOM PROCEDURE TRAY) ×3 IMPLANT
SEAL CANN UNIV 5-8 DVNC XI (MISCELLANEOUS) ×4 IMPLANT
SEAL XI 5MM-8MM UNIVERSAL (MISCELLANEOUS) ×12
SEALER VESSEL DA VINCI XI (MISCELLANEOUS)
SEALER VESSEL EXT DVNC XI (MISCELLANEOUS) IMPLANT
SOL ANTI FOG 6CC (MISCELLANEOUS) ×1 IMPLANT
SOLUTION ANTI FOG 6CC (MISCELLANEOUS) ×2
SOLUTION ELECTROLUBE (MISCELLANEOUS) ×3 IMPLANT
SPONGE T-LAP 18X18 ~~LOC~~+RFID (SPONGE) ×3 IMPLANT
SUT MNCRL AB 4-0 PS2 18 (SUTURE) ×3 IMPLANT
SUT VICRYL 0 TIES 12 18 (SUTURE) ×3 IMPLANT
SYR 20ML LL LF (SYRINGE) ×3 IMPLANT
SYR CONTROL 10ML LL (SYRINGE) ×3 IMPLANT
SYS RETRIEVAL 5MM INZII UNIV (BASKET) ×3
SYSTEM RETRIEVL 5MM INZII UNIV (BASKET) ×1 IMPLANT
TOWEL OR 17X26 10 PK STRL BLUE (TOWEL DISPOSABLE) ×3 IMPLANT
TOWEL OR NON WOVEN STRL DISP B (DISPOSABLE) ×3 IMPLANT
TRAY FOLEY MTR SLVR 16FR STAT (SET/KITS/TRAYS/PACK) IMPLANT
TUBING INSUFFLATION 10FT LAP (TUBING) ×3 IMPLANT

## 2021-02-14 NOTE — Anesthesia Preprocedure Evaluation (Addendum)
Anesthesia Evaluation  Patient identified by MRN, date of birth, ID band Patient awake    Reviewed: Allergy & Precautions, NPO status , Patient's Chart, lab work & pertinent test results  History of Anesthesia Complications (+) PONV  Airway Mallampati: II  TM Distance: >3 FB     Dental no notable dental hx.    Pulmonary sleep apnea and Continuous Positive Airway Pressure Ventilation ,    Pulmonary exam normal        Cardiovascular hypertension, Pt. on medications  Rhythm:Regular Rate:Normal     Neuro/Psych negative neurological ROS  negative psych ROS   GI/Hepatic Neg liver ROS, GERD  ,  Endo/Other  diabetes, Type 2, Insulin Dependent, Oral Hypoglycemic Agents  Renal/GU negative Renal ROS  negative genitourinary   Musculoskeletal  (+) Arthritis , Osteoarthritis,    Abdominal Normal abdominal exam  (+)   Peds  Hematology negative hematology ROS (+)   Anesthesia Other Findings   Reproductive/Obstetrics                            Anesthesia Physical Anesthesia Plan  ASA: 2  Anesthesia Plan: General   Post-op Pain Management:    Induction: Intravenous  PONV Risk Score and Plan: 4 or greater and Ondansetron, Dexamethasone and Treatment may vary due to age or medical condition  Airway Management Planned: Mask and Oral ETT  Additional Equipment: None  Intra-op Plan:   Post-operative Plan: Extubation in OR  Informed Consent: I have reviewed the patients History and Physical, chart, labs and discussed the procedure including the risks, benefits and alternatives for the proposed anesthesia with the patient or authorized representative who has indicated his/her understanding and acceptance.     Dental advisory given  Plan Discussed with: CRNA  Anesthesia Plan Comments: (Lab Results      Component                Value               Date                      WBC                       7.4                 01/26/2021                HGB                      13.0                01/26/2021                HCT                      39.2                01/26/2021                MCV                      92.2                01/26/2021                PLT  162                 01/26/2021           Lab Results      Component                Value               Date                      NA                       134 (L)             01/26/2021                K                        4.5                 01/26/2021                CO2                      22                  01/26/2021                GLUCOSE                  281 (H)             01/26/2021                BUN                      19                  01/26/2021                CREATININE               1.14 (H)            01/26/2021                CALCIUM                  8.9                 01/26/2021                GFRNONAA                 51 (L)              01/26/2021          )       Anesthesia Quick Evaluation

## 2021-02-14 NOTE — Discharge Instructions (Signed)
LAPAROSCOPIC SURGERY: POST OP INSTRUCTIONS   EAT Gradually transition to a high fiber diet with a fiber supplement over the next few weeks after discharge.  Start with a pureed / full liquid diet (see below)  WALK Walk an hour a day.  Control your pain to do that.    CONTROL PAIN Control pain so that you can walk, sleep, tolerate sneezing/coughing, go up/down stairs.  HAVE A BOWEL MOVEMENT DAILY Keep your bowels regular to avoid problems.  OK to try a laxative to override constipation.  OK to use an antidairrheal to slow down diarrhea.  Call if not better after 2 tries  CALL IF YOU HAVE PROBLEMS/CONCERNS Call if you are still struggling despite following these instructions. Call if you have concerns not answered by these instructions    DIET: Follow a light bland diet & liquids the first 24 hours after arrival home, such as soup, liquids, starches, etc.  Be sure to drink plenty of fluids.  Quickly advance to a usual solid diet within a few days.  Avoid fast food or heavy meals as your are more likely to get nauseated or have irregular bowels.  A low-sugar, high-fiber diet for the rest of your life is ideal.  Take your usually prescribed home medications unless otherwise directed.  PAIN CONTROL: Pain is best controlled by a usual combination of three different methods TOGETHER: Ice/Heat Over the counter pain medication Prescription pain medication Most patients will experience some swelling and bruising around the incisions.  Ice packs or heating pads (30-60 minutes up to 6 times a day) will help. Use ice for the first few days to help decrease swelling and bruising, then switch to heat to help relax tight/sore spots and speed recovery.  Some people prefer to use ice alone, heat alone, alternating between ice & heat.  Experiment to what works for you.  Swelling and bruising can take several weeks to resolve.   It is helpful to take an over-the-counter pain medication regularly for the  first few days: Naproxen (Aleve, etc)  Two 220mg  tabs twice a day OR Ibuprofen (Advil, etc) Three 200mg  tabs four times a day (every meal & bedtime) AND Acetaminophen (Tylenol, etc) 500-650mg  four times a day (every meal & bedtime) A  prescription for pain medication (such as oxycodone, hydrocodone, tramadol, gabapentin, methocarbamol, etc) should be given to you upon discharge.  Take your pain medication as prescribed, IF NEEDED.  If you are having problems/concerns with the prescription medicine (does not control pain, nausea, vomiting, rash, itching, etc), please call us [phone] to see if we need to switch you to a different pain medicine that will work better for you and/or control your side effect better. If you need a refill on your pain medication, please give [place] 48 hour notice.  contact your pharmacy.  They will contact our office to request authorization. Prescriptions will not be filled after 5 pm or on week-ends  Avoid getting constipated.   Between the surgery and the pain medications, it is common to experience some constipation.   Increasing fluid intake and taking a fiber supplement (such as Metamucil, Citrucel, FiberCon, MiraLax, etc) 1-2 times a day regularly will usually help prevent this problem from occurring.   A mild laxative (prune juice, Milk of Magnesia, MiraLax, etc) should be taken according to package directions if there are no bowel movements after 48 hours.   Watch out for diarrhea.   If you have many loose bowel movements, simplify your diet  to bland foods & liquids for a few days.   Stop any stool softeners and decrease your fiber supplement.   Switching to mild anti-diarrheal medications (Kayopectate, Pepto Bismol) can help.   If this worsens or does not improve, please call us.  Wash / shower every day.  You may shower over the skin glue which is waterproof  Glue will flake off after about 2 weeks.  You may leave the incision open to air.  You may replace a  dressing/Band-Aid to cover the incision for comfort if you wish.   ACTIVITIES as tolerated:   You may resume regular (light) daily activities beginning the next day--such as daily self-care, walking, climbing stairs--gradually increasing activities as tolerated.  If you can walk 30 minutes without difficulty, it is safe to try more intense activity such as jogging, treadmill, bicycling, low-impact aerobics, swimming, etc. Save the most intensive and strenuous activity for last such as sit-ups, heavy lifting, contact sports, etc  Refrain from any heavy lifting or straining until you are off narcotics for pain control.   DO NOT PUSH THROUGH PAIN.  Let pain be your guide: If it hurts to do something, don't do it.  Pain is your body warning you to avoid that activity for another week until the pain goes down. You may drive when you are no longer taking prescription pain medication, you can comfortably wear a seatbelt, and you can safely maneuver your car and apply brakes. You may have sexual intercourse when it is comfortable.  FOLLOW UP in our office Please call [facility] at [phone] to set up an appointment to see your surgeon in the office for a follow-up appointment approximately 2-3 weeks after your surgery. Make sure that you call for this appointment the day you arrive home to insure a convenient appointment time.  10. IF YOU HAVE DISABILITY OR FAMILY LEAVE FORMS, BRING THEM TO THE OFFICE FOR PROCESSING.  DO NOT GIVE THEM TO YOUR DOCTOR.   WHEN TO CALL us [phone]: Poor pain control Reactions / problems with new medications (rash/itching, nausea, etc)  Fever over 101.5 F (38.5 C) Inability to urinate Nausea and/or vomiting Worsening swelling or bruising Continued bleeding from incision. Increased pain, redness, or drainage from the incision   The clinic staff is available to answer your questions during regular business hours (8:30am-5pm).  Please don't hesitate to call and ask to  speak to one of our nurses for clinical concerns.   If you have a medical emergency, go to the nearest emergency room or call 911.  A surgeon from [facility] is always on call at the [facility], [place], [place], [place], [place]  [address] ? MAIN: [phone] ? TOLL FREE: [phone] ?  FAX [phone] [email or web address]

## 2021-02-14 NOTE — Op Note (Signed)
Operative Note  Lindsey Baldwin  [id number]  [id number]  02/14/2021   Surgeon: Romana Juniper MD FACS   Procedure performed: xi Robotic cholecystectomy   Preop diagnosis: Biliary colic Post-op diagnosis/intraop findings: Same, chronic cholecystitis, enlarged steatotic/ likely fibrosis appearing liver   Specimens: Gallbladder Retained items: No EBL: Normal cc Complications: none   Description of procedure:  After obtaining informed consent the patient was brought to the operating room. Antibiotics were administered. SCD's were applied. General endotracheal anesthesia was initiated and a formal time-out was performed. The abdomen was prepped and draped in the usual sterile fashion and peritoneal access gained using a left subcostal veress needle after instilling the site with local. Insufflation to 4mmHg was obtained, periumbilical 54mm trocar and camera inserted, and gross inspection revealed no evidence of injury from our entry.  The liver is noted to be enlarged and coarse in appearance, consistent with hepatic steatosis/likely fibrosis. bilateral laparoscopic assisted taps blocks were performed with Exparel mixed with quarter percent Marcaine.  Three additional 8 mm robotic trocars were introduced in the right midclavicular and right anterior axillary lines under direct visualization and following infiltration with local.  The robot was then docked and instruments inserted under direct visualization.   The gallbladder was retracted cephalad and the infundibulum was retracted laterally.  The gallbladder is noted to be very distended with fatty infiltration along the hepatocystic triangle consistent with chronic inflammation.  A combination of hook electrocautery and blunt dissection was utilized to clear the peritoneum from the neck and cystic duct, circumferentially isolating the cystic artery and cystic duct and lifting the gallbladder from the cystic plate.  The cystic duct is quite  diminutive.  The critical view of safety was achieved with the cystic artery, cystic duct, and liver bed visualized between them with no other structures.  This was concordant with the view on firefly.  The artery was clipped with a single clip proximally and distally and divided as was the cystic duct with three clips on the proximal end. The gallbladder was dissected from the liver plate using electrocautery.  Hemostasis along the liver bed was ensured with cautery as the dissection progressed.  The plane between the gallbladder and the liver bed was very thin and the tissue was friable and there was some oozing along the liver bed which was ultimately controlled with cautery and ultimately a piece of Surgicel snow was placed at the end of the case.  Small amount of bile but no stones were spilled from the gallbladder during dissection from the liver bed.  Once freed the gallbladder was placed in an endocatch bag and removed through the left upper quadrant trocar site.  The right upper quadrant was irrigated and aspirated, the effluent was clear.  Hemostasis was once again confirmed, and reinspection of the abdomen revealed no injuries. The clips were well opposed without any bile leak from the duct or the liver bed on direct visualization nor on firefly. The left upper quadrant extraction port site was closed with a 0 vicryl in the fascia under direct visualization using a PMI device. The abdomen was desufflated and all trocars removed. The skin incisions were closed with subcuticular 4-0 monocryl and Dermabond. The patient was awakened, extubated and transported to the recovery room in stable condition.      All counts were correct at the completion of the case.

## 2021-02-14 NOTE — H&P (Signed)
[facility] [id number]    Referring Provider:  Orvis Brill, MD     Subjective    Chief Complaint: New Consultation (Gallbladder)       History of Present Illness: 75 year old woman with history of diabetes, osteoporosis, obstructive sleep apnea on CPAP, osteoarthritis, hypertension, GERD who has been having nearly daily upper abdominal pain and nausea for about 4 months.  This often comes on after eating.  She denies any vomiting but has felt like she was about to many times.  Reports that her bowel movements have been less regular as well.  No previous abdominal surgery.  She has undergone work-up including CT scan, upper endoscopy (per patient's report was unremarkable) and ultrasound which does demonstrate gallstones and hepatic steatosis.     Review of Systems: A complete review of systems was obtained from the patient.  I have reviewed this information and discussed as appropriate with the patient.  See HPI as well for other ROS.     Medical History:     Past Medical History:  Diagnosis Date   Arthritis     Diabetes mellitus without complication (CMS-HCC)     Glaucoma (increased eye pressure)     Sleep apnea        There is no problem list on file for this patient.          Past Surgical History:  Procedure Laterality Date   TONSILLECTOMY               Allergies  Allergen Reactions   Penicillins Swelling and Other (See Comments)      Has patient had a PCN reaction causing immediate rash, facial/tongue/throat swelling, SOB or lightheadedness with hypotension: YES Has patient had a PCN reaction causing severe rash involving mucus membranes or skin necrosis: NO Has patient had a PCN reaction that required hospitalization: NO Has patient had a PCN reaction occurring within the last 10 years: NO If all of the above answers are "NO", then may proceed with Cephalosporin use.     Bimatoprost Itching   Dorzolamide-Timolol Itching   Sulfa (Sulfonamide  Antibiotics) Rash            Current Outpatient Medications on File Prior to Visit  Medication Sig Dispense Refill   famotidine (PEPCID) 20 MG tablet Take 20 mg by mouth once daily       insulin ASPART (NOVOLOG) injection (concentration 100 units/mL) 0-15 Units, Subcutaneous, 3 times daily with meals CBG < 70: Implement Hypoglycemia protocol CBG 70 - 120: 0 units CBG 121 - 150: 2 units CBG 151 - 200: 3 units CBG 201 - 250: 5 units CBG 251 - 300: 8 units CBG 301 - 350: 11 units CBG 351 - 400: 15 units CBG > 400: call MD       latanoprost (XALATAN) 0.005 % ophthalmic solution latanoprost 0.005 % eye drops       metoprolol tartrate (LOPRESSOR) 25 MG tablet Take 12.5 mg by mouth 3 (three) times daily as needed       mirabegron (MYRBETRIQ) 50 mg ER tablet Myrbetriq 50 mg tablet,extended release       tamsulosin (FLOMAX) 0.4 mg capsule Take 1 capsule by mouth once daily       timoloL maleate (TIMOPTIC) 0.5 % ophthalmic solution timolol maleate 0.5 % eye drops       acetaminophen (TYLENOL) 500 MG tablet Take by mouth       ALPRAZolam (XANAX) 0.5 MG tablet alprazolam 0.5 mg tablet  aspirin 81 MG chewable tablet Take 81 mg by mouth once daily       atorvastatin (LIPITOR) 40 MG tablet atorvastatin 40 mg tablet       calcium carbonate (TUMS) 200 mg calcium (500 mg) chewable tablet Take by mouth       enalapril (VASOTEC) 10 MG tablet         metFORMIN (GLUCOPHAGE) 1000 MG tablet metformin 1,000 mg tablet       multivitamin with minerals tablet Take 1 tablet by mouth once daily       ondansetron (ZOFRAN) 8 MG tablet         pantoprazole (PROTONIX) 40 MG DR tablet         SITagliptin (JANUVIA) 100 MG tablet Take 100 mg by mouth every morning        No current facility-administered medications on file prior to visit.      History reviewed. No pertinent family history.    Social History       Tobacco Use  Smoking Status Never Smoker  Smokeless Tobacco Never Used      Social  History        Socioeconomic History   Marital status: Single  Tobacco Use   Smoking status: Never Smoker   Smokeless tobacco: Never Used  Scientific laboratory technician Use: Never used  Substance and Sexual Activity   Alcohol use: Never   Drug use: Never      Objective:         Vitals:    01/13/21 1021  BP: (!) 140/90  Pulse: 98  Temp: 36.9 C (98.5 F)  SpO2: 99%  Weight: 85.5 kg (188 lb 9.6 oz)  Height: 157.5 cm (5\' 2" )    Body mass index is 34.5 kg/m.   Alert, calm Unlabored respirations Abdomen is obese, distended, mildly tender in the epigastrium and right upper quad   Assessment and Plan:  Diagnoses and all orders for this visit:   Biliary colic -     CCS Case Posting Request; Future     I recommend proceeding with laparoscopic or robotic cholecystectomy with possible cholangiogram.  Discussed the relevant anatomy using a diagram to demonstrate, and went over surgical technique.  Discussed risks of surgery including bleeding, pain, scarring, intraabdominal injury specifically to the common bile duct and sequelae, bile leak, conversion to open surgery, failure to resolve symptoms, blood clots/ pulmonary embolus, heart attack, pneumonia, stroke, death. Questions welcomed and answered to patient's satisfaction.   She does have her power of attorney with her here today who requests that she be involved with the scheduling process.   Chayton Murata Raquel James, MD

## 2021-02-14 NOTE — Anesthesia Procedure Notes (Signed)
Procedure Name: Intubation Date/Time: 02/14/2021 3:07 PM Performed by: Gean Maidens, CRNA Pre-anesthesia Checklist: Patient identified, Emergency Drugs available, Suction available, Patient being monitored and Timeout performed Patient Re-evaluated:Patient Re-evaluated prior to induction Oxygen Delivery Method: Circle system utilized Preoxygenation: Pre-oxygenation with 100% oxygen Induction Type: IV induction Ventilation: Mask ventilation without difficulty Laryngoscope Size: Mac and 4 Grade View: Grade II Tube type: Oral Tube size: 7.0 mm Number of attempts: 1 Airway Equipment and Method: Stylet Placement Confirmation: ETT inserted through vocal cords under direct vision, positive ETCO2 and breath sounds checked- equal and bilateral Secured at: 21 cm Tube secured with: Tape Dental Injury: Teeth and Oropharynx as per pre-operative assessment

## 2021-02-14 NOTE — Transfer of Care (Signed)
Immediate Anesthesia Transfer of Care Note  Patient: Lindsey Baldwin  Procedure(s) Performed: XI ROBOTIC ASSISTED LAPAROSCOPIC CHOLECYSTECTOMY  Patient Location: PACU  Anesthesia Type:General  Level of Consciousness: sedated, patient cooperative and responds to stimulation  Airway & Oxygen Therapy: Patient Spontanous Breathing and Patient connected to face mask oxygen  Post-op Assessment: Report given to RN and Post -op Vital signs reviewed and stable  Post vital signs: Reviewed and stable  Last Vitals:  Vitals Value Taken Time  BP 149/71 02/14/21 1617  Temp    Pulse 81 02/14/21 1620  Resp 17 02/14/21 1619  SpO2 98 % 02/14/21 1620  Vitals shown include unvalidated device data.  Last Pain:  Vitals:   02/14/21 1203  TempSrc:   PainSc: 0-No pain         Complications: No notable events documented.

## 2021-02-15 NOTE — Anesthesia Postprocedure Evaluation (Signed)
Anesthesia Post Note  Patient: Lindsey Baldwin  Procedure(s) Performed: XI ROBOTIC ASSISTED LAPAROSCOPIC CHOLECYSTECTOMY     Patient location during evaluation: PACU Anesthesia Type: General Level of consciousness: awake and alert Pain management: pain level controlled Vital Signs Assessment: post-procedure vital signs reviewed and stable Respiratory status: spontaneous breathing, nonlabored ventilation, respiratory function stable and patient connected to nasal cannula oxygen Cardiovascular status: blood pressure returned to baseline and stable Postop Assessment: no apparent nausea or vomiting Anesthetic complications: no   No notable events documented.  Last Vitals:  Vitals:   02/14/21 1715 02/14/21 1730  BP: 132/72 (!) 144/76  Pulse: 86 65  Resp: (!) 23 14  Temp:  36.7 C  SpO2: 92% 97%    Last Pain:  Vitals:   02/14/21 1730  TempSrc:   PainSc: 0-No pain                 [name]

## 2021-02-16 LAB — SURGICAL PATHOLOGY

## 2021-03-07 DIAGNOSIS — E7800 Pure hypercholesterolemia, unspecified: Secondary | ICD-10-CM | POA: Diagnosis not present

## 2021-03-07 DIAGNOSIS — I10 Essential (primary) hypertension: Secondary | ICD-10-CM | POA: Diagnosis not present

## 2021-03-07 DIAGNOSIS — Z7984 Long term (current) use of oral hypoglycemic drugs: Secondary | ICD-10-CM | POA: Diagnosis not present

## 2021-03-07 DIAGNOSIS — E1140 Type 2 diabetes mellitus with diabetic neuropathy, unspecified: Secondary | ICD-10-CM | POA: Diagnosis not present

## 2021-03-18 IMAGING — DX DG CHEST 1V PORT
1 series · 1 of 1 positions shown · non-contrast
Comparison: 02/12/2019

CLINICAL DATA: Short of breath chest pain COVID

EXAM:
PORTABLE CHEST 1 VIEW

[chest ap]
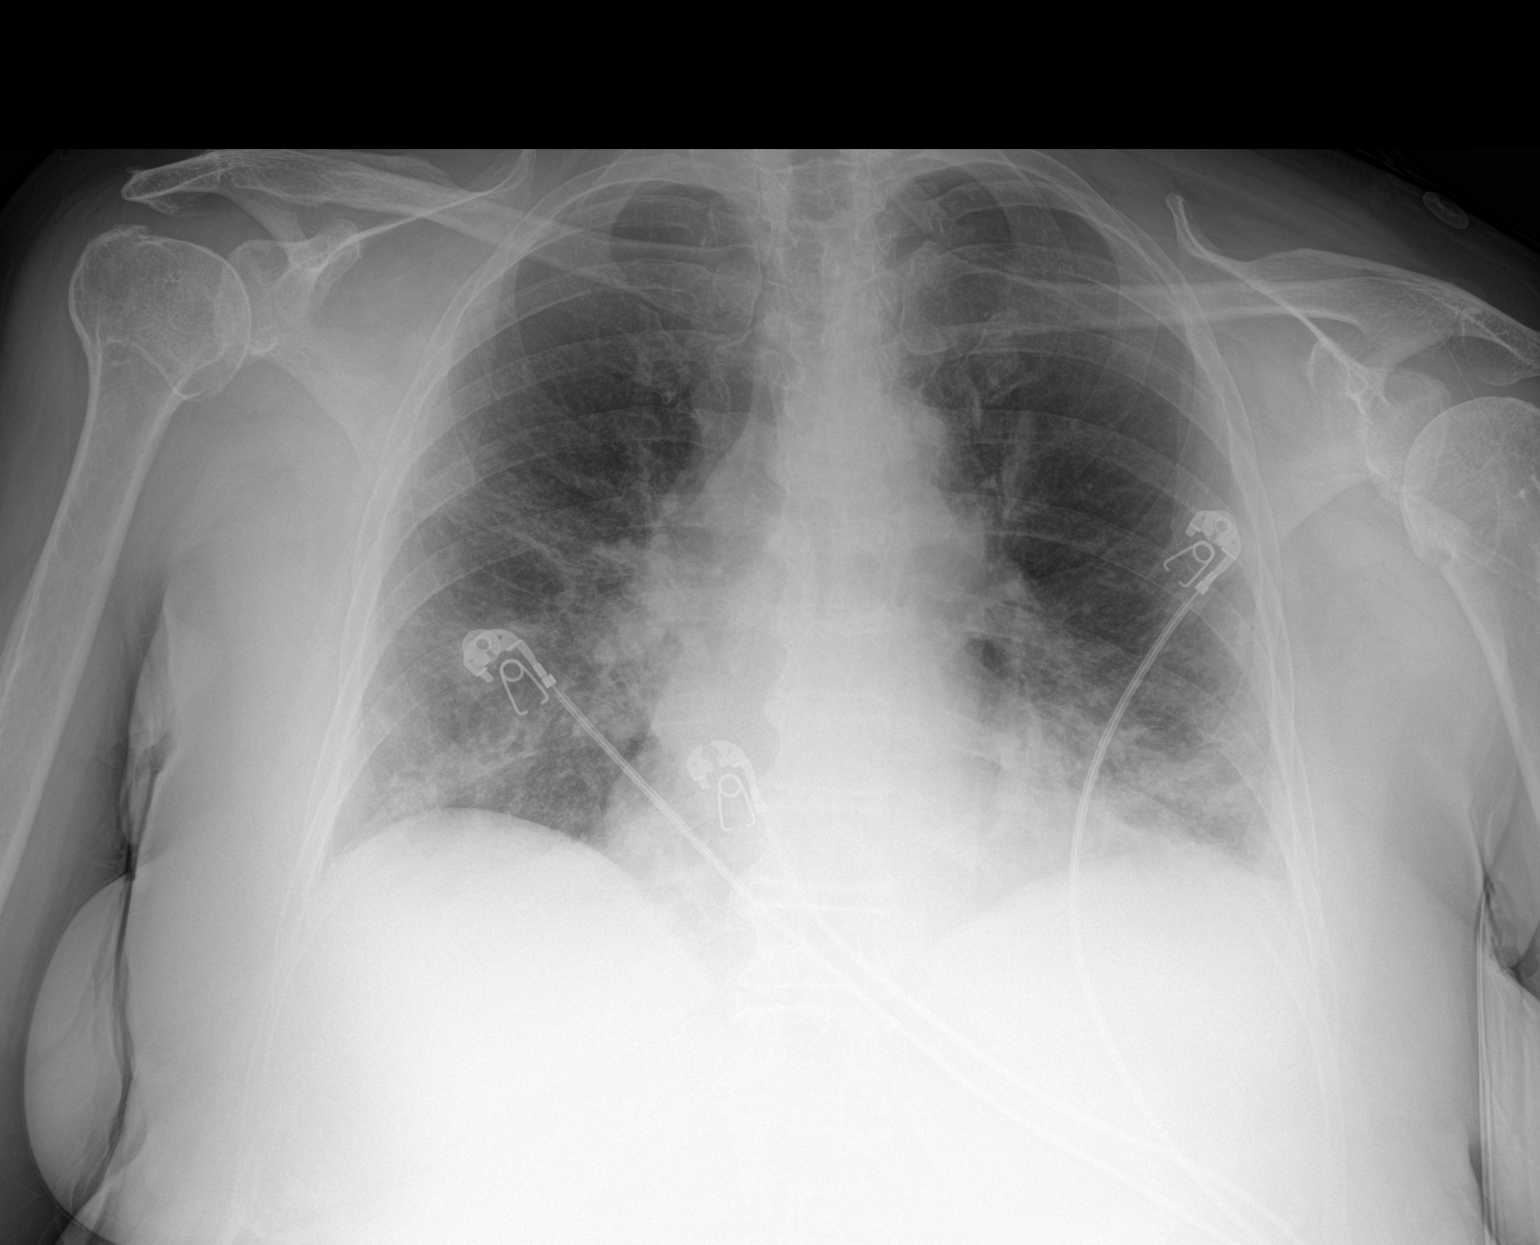

[1 of 1 positions shown; findings below may reference images not displayed]

FINDINGS: Interim development of patchy airspace disease within the bilateral
mid to lower lung zones. No pleural effusion. Stable
cardiomediastinal silhouette. No pneumothorax. Age indeterminate
suspected fracture deformity at the proximal right humerus.
IMPRESSION: 1. Interim development of bilateral airspace disease in the mid to
lower lungs compatible with multifocal pneumonia
2. Age indeterminate deformity of the proximal right humerus.
Dedicated right shoulder radiographs may be obtained if clinically
warranted.

## 2021-03-21 DIAGNOSIS — G4733 Obstructive sleep apnea (adult) (pediatric): Secondary | ICD-10-CM | POA: Diagnosis not present

## 2021-04-06 ENCOUNTER — Ambulatory Visit
Admission: RE | Admit: 2021-04-06 | Discharge: 2021-04-06 | Disposition: A | Discharge status: Home / Self Care | Payer: Medicare Other | Source: Ambulatory Visit | Attending: Family Medicine | Admitting: Family Medicine

## 2021-04-06 DIAGNOSIS — M85832 Other specified disorders of bone density and structure, left forearm: Secondary | ICD-10-CM | POA: Diagnosis not present

## 2021-04-06 DIAGNOSIS — M810 Age-related osteoporosis without current pathological fracture: Secondary | ICD-10-CM | POA: Diagnosis not present

## 2021-04-06 DIAGNOSIS — Z780 Asymptomatic menopausal state: Secondary | ICD-10-CM | POA: Diagnosis not present

## 2021-06-21 DIAGNOSIS — H401113 Primary open-angle glaucoma, right eye, severe stage: Secondary | ICD-10-CM | POA: Diagnosis not present

## 2021-06-28 DIAGNOSIS — R413 Other amnesia: Secondary | ICD-10-CM | POA: Diagnosis not present

## 2021-08-01 ENCOUNTER — Encounter: Payer: Self-pay | Admitting: Neurology

## 2021-08-01 ENCOUNTER — Ambulatory Visit: Payer: Medicare Other | Admitting: Neurology

## 2021-08-01 VITALS — BP 158/86 | HR 93 | Ht 62.0 in | Wt 179.0 lb

## 2021-08-01 DIAGNOSIS — G301 Alzheimer's disease with late onset: Secondary | ICD-10-CM

## 2021-08-01 DIAGNOSIS — E7800 Pure hypercholesterolemia, unspecified: Secondary | ICD-10-CM | POA: Insufficient documentation

## 2021-08-01 DIAGNOSIS — R7401 Elevation of levels of liver transaminase levels: Secondary | ICD-10-CM | POA: Insufficient documentation

## 2021-08-01 DIAGNOSIS — R413 Other amnesia: Secondary | ICD-10-CM | POA: Insufficient documentation

## 2021-08-01 DIAGNOSIS — M5136 Other intervertebral disc degeneration, lumbar region: Secondary | ICD-10-CM | POA: Insufficient documentation

## 2021-08-01 DIAGNOSIS — M810 Age-related osteoporosis without current pathological fracture: Secondary | ICD-10-CM | POA: Insufficient documentation

## 2021-08-01 DIAGNOSIS — G2581 Restless legs syndrome: Secondary | ICD-10-CM | POA: Insufficient documentation

## 2021-08-01 DIAGNOSIS — R262 Difficulty in walking, not elsewhere classified: Secondary | ICD-10-CM | POA: Insufficient documentation

## 2021-08-01 DIAGNOSIS — R1084 Generalized abdominal pain: Secondary | ICD-10-CM | POA: Insufficient documentation

## 2021-08-01 DIAGNOSIS — R1907 Generalized intra-abdominal and pelvic swelling, mass and lump: Secondary | ICD-10-CM | POA: Insufficient documentation

## 2021-08-01 DIAGNOSIS — F02A0 Dementia in other diseases classified elsewhere, mild, without behavioral disturbance, psychotic disturbance, mood disturbance, and anxiety: Secondary | ICD-10-CM | POA: Diagnosis not present

## 2021-08-01 DIAGNOSIS — K219 Gastro-esophageal reflux disease without esophagitis: Secondary | ICD-10-CM | POA: Insufficient documentation

## 2021-08-01 DIAGNOSIS — I700 Atherosclerosis of aorta: Secondary | ICD-10-CM | POA: Insufficient documentation

## 2021-08-01 DIAGNOSIS — Z860100 Personal history of colon polyps, unspecified: Secondary | ICD-10-CM | POA: Insufficient documentation

## 2021-08-01 DIAGNOSIS — E1142 Type 2 diabetes mellitus with diabetic polyneuropathy: Secondary | ICD-10-CM | POA: Insufficient documentation

## 2021-08-01 DIAGNOSIS — H409 Unspecified glaucoma: Secondary | ICD-10-CM | POA: Insufficient documentation

## 2021-08-01 DIAGNOSIS — I8393 Asymptomatic varicose veins of bilateral lower extremities: Secondary | ICD-10-CM | POA: Insufficient documentation

## 2021-08-01 DIAGNOSIS — R609 Edema, unspecified: Secondary | ICD-10-CM | POA: Insufficient documentation

## 2021-08-01 DIAGNOSIS — K760 Fatty (change of) liver, not elsewhere classified: Secondary | ICD-10-CM | POA: Insufficient documentation

## 2021-08-01 DIAGNOSIS — K8020 Calculus of gallbladder without cholecystitis without obstruction: Secondary | ICD-10-CM | POA: Insufficient documentation

## 2021-08-01 DIAGNOSIS — E1165 Type 2 diabetes mellitus with hyperglycemia: Secondary | ICD-10-CM | POA: Insufficient documentation

## 2021-08-01 DIAGNOSIS — N3281 Overactive bladder: Secondary | ICD-10-CM | POA: Insufficient documentation

## 2021-08-01 DIAGNOSIS — N200 Calculus of kidney: Secondary | ICD-10-CM | POA: Insufficient documentation

## 2021-08-01 DIAGNOSIS — K2940 Chronic atrophic gastritis without bleeding: Secondary | ICD-10-CM | POA: Insufficient documentation

## 2021-08-01 MED ORDER — RIVASTIGMINE TARTRATE 3 MG PO CAPS: 3.0000 mg | ORAL_CAPSULE | Freq: Two times a day (BID) | ORAL | 6 refills | Status: DC

## 2021-08-01 NOTE — Progress Notes (Signed)
? ?[facility] ? ?PATIENT: Lindsey Baldwin ?DOB: 1945-09-14 ? ?REQUESTING CLINICIAN: Donald Prose, MD ?HISTORY FROM: Patient and friend Seth Bake ?REASON FOR VISIT: Memory deficit  ? ? ?HISTORICAL ? ?CHIEF COMPLAINT:  ?Chief Complaint  ?Patient presents with  ? New Patient (Initial Visit)  ?  Rm 12. Accompanied by Seth Bake. ?NP/paper proficient/Vyvyan Nancy Fetter MD [facility]/Memory loss. ?Struggles with taking medication. ?[place]oca 20/30.  ? ? ?HISTORY OF PRESENT ILLNESS:  ?This is a 76 year old woman with medical history including hypertension, hyperlipidemia, diabetes mellitus, overactive bladder who is presenting with her longtime friend Seth Bake for memory deficit.  Per Seth Bake who is now at the power of attorney, she reports patient has been suffering with memory for the past 2 years.  She has known patient for more than 30 years.  Memory deficits started by patient repeating herself, being forgetful asking the same questions over and over.  She also mismanaged her money.  She sold her house and her financial advisor set her up for the next 10 years but she mismanaged the money and ran out of money in 4 years.  Currently she is unable to pay her own rent.  She cannot explain how or where the money is gone.  On top of that she forgets to take her medications, there are bottles of medications all over her apartment and she is also forgetting doctors appointments.  ?There is also concern for patient on self-care.  Currently she does live in independent living facility and Seth Bake is trying to take her to a assisted living facility. ? ?TBI:   No past history of TBI ?Stroke:   no past history of stroke ?Seizures:   no past history of seizures ?Sleep:  Yes, uses a CPAP machine.  ?Mood:  patient denies anxiety and depression ? ?Functional status: dependent in some ADLs and IADLs ?Patient lives independent living.  ?Cooking: Meals are provided ?Cleaning: No ?Shopping: Yes  ?Bathing: self  ?Toileting: self ?Driving:  Not driving  ?Bills: Need helps with her bills, cannot manage her own affair anymore  ? ?Ever left the stove on by accident?: Does not cook  ?Forget how to use items around the house?: No  ?Getting lost going to familiar places?: No  ?Forgetting loved ones names?: Yes  ?Word finding difficulty? No  ?Sleep: 6 to 7 hrs  ? ? ?OTHER MEDICAL CONDITIONS: Hypertension, hyperlipidemia, diabetes mellitus type 2, overactive bladder ? ? ?REVIEW OF SYSTEMS: Full 14 system review of systems performed and negative with exception of: as noted in the HPI  ? ?ALLERGIES: ?Allergies  ?Allergen Reactions  ? Apraclonidine Hcl   ?  Other reaction(s): Unknown  ? Penicillins Swelling and Other (See Comments)  ?  Has patient had a PCN reaction causing immediate rash, facial/tongue/throat swelling, SOB or lightheadedness with hypotension: YES ?Has patient had a PCN reaction causing severe rash involving mucus membranes or skin necrosis: NO ?Has patient had a PCN reaction that required hospitalization: NO ?Has patient had a PCN reaction occurring within the last 10 years: NO ?If all of the above answers are "NO", then may proceed with Cephalosporin use. ?  ? Sulfa Antibiotics Rash  ? ? ?HOME MEDICATIONS: ?Outpatient Medications Prior to Visit  ?Medication Sig Dispense Refill  ? acetaminophen (TYLENOL) 500 MG tablet Take 500 mg by mouth every 6 (six) hours as needed (for pain.).    ? aspirin 81 MG chewable tablet Chew 1 tablet (81 mg total) by mouth 2 (two) times daily. (Patient taking  differently: Chew 81 mg by mouth daily.) 60 tablet 1  ? atorvastatin (LIPITOR) 40 MG tablet Take 40 mg by mouth every evening.     ? calcium carbonate (TUMS - DOSED IN MG ELEMENTAL CALCIUM) 500 MG chewable tablet Chew 500 mg by mouth daily as needed for indigestion or heartburn.    ? enalapril (VASOTEC) 10 MG tablet Take 10 mg by mouth daily.    ? famotidine (PEPCID) 20 MG tablet Take 1 tablet (20 mg total) by mouth daily.    ? furosemide (LASIX) 20 MG tablet  Take 20 mg by mouth daily.     ? ibuprofen (ADVIL,MOTRIN) 200 MG tablet Take 200 mg by mouth every 8 (eight) hours as needed (pain.).    ? insulin aspart (NOVOLOG) 100 UNIT/ML injection 0-15 Units, Subcutaneous, 3 times daily with meals ?CBG < 70: Implement Hypoglycemia protocol ?CBG 70 - 120: 0 units ?CBG 121 - 150: 2 units ?CBG 151 - 200: 3 units ?CBG 201 - 250: 5 units ?CBG 251 - 300: 8 units ?CBG 301 - 350: 11 units ?CBG 351 - 400: 15 units ?CBG > 400: call MD 10 mL 11  ? latanoprost (XALATAN) 0.005 % ophthalmic solution Place 1 drop into the right eye at bedtime.    ? loratadine (CLARITIN) 10 MG tablet Take 10 mg by mouth daily as needed for allergies.    ? metFORMIN (GLUCOPHAGE-XR) 500 MG 24 hr tablet Take 1,000 mg by mouth at bedtime.    ? metoprolol tartrate (LOPRESSOR) 25 MG tablet Take 0.5 tablets (12.5 mg total) by mouth 2 (two) times daily.    ? Multiple Vitamins-Minerals (MULTIVITAMIN WITH MINERALS) tablet Take 1 tablet by mouth daily.    ? MYRBETRIQ 50 MG TB24 tablet TAKE 1 TABLET EACH DAY. 30 tablet PRN  ? ondansetron (ZOFRAN) 8 MG tablet TAKE 1 TABLET THREE TIMES DAILY AS NEEDED FOR NAUSEA. 30 tablet 0  ? pantoprazole (PROTONIX) 40 MG tablet Take 40 mg by mouth daily.    ? pilocarpine (PILOCAR) 1 % ophthalmic solution Place 1 drop into the right eye 4 (four) times daily.    ? solifenacin (VESICARE) 5 MG tablet Take 5 mg by mouth daily.    ? tamsulosin (FLOMAX) 0.4 MG CAPS capsule TAKE 1 CAPSULE BY MOUTH EVERY DAY 30 capsule 3  ? timolol (BETIMOL) 0.5 % ophthalmic solution Place 1 drop into the right eye 2 (two) times daily.    ? ?No facility-administered medications prior to visit.  ? ? ?PAST MEDICAL HISTORY: ?Past Medical History:  ?Diagnosis Date  ? 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 Bronchitis   ? GERD (gastroesophageal reflux disease)   ? Glaucoma, right eye   ? CLOSED ANGLE  ? History of kidney stones   ? Hypertension   ? OA (osteoarthritis)   ? KNEES , HANDS  ? OAB (overactive bladder)    ? OSA on CPAP   ? Osteoporosis   ? PONV (postoperative nausea and vomiting)   ? Type 2 diabetes mellitus ([place])   ? FOLLOWED BY PCP  ? Wears glasses   ? ? ?PAST SURGICAL HISTORY: ?Past Surgical History:  ?Procedure Laterality Date  ? CYST EXCISION  08/2017  ? POSTERIOR NECK  ? CYSTOSCOPY WITH RETROGRADE PYELOGRAM, URETEROSCOPY AND STENT PLACEMENT Left 06/16/2018  ? Procedure: diagnostic URETEROSCOPY AND STENT PLACEMENT;  Surgeon: Cleon Gustin, MD;  Location: [facility];  Service: Urology;  Laterality: Left;  1 HR  ? EYE SURGERY  Left 1992  ? REMOVE EYE AND PLACEMENT PROSTHESIS  ? KNEE ARTHROPLASTY Right 12/12/2017  ? Procedure: RIGHT TOTAL KNEE ARTHROPLASTY WITH COMPUTER NAVIGATION;  Surgeon: Rod Can, MD;  Location: [facility];  Service: Orthopedics;  Laterality: Right;  Needs RNFA  ? TONSILLECTOMY  AGE 76  ? ? ?FAMILY HISTORY: ?Family History  ?Problem Relation Age of Onset  ? Cancer Mother   ? Cancer Father   ? Breast cancer Maternal Aunt   ? ? ?SOCIAL HISTORY: ?Social History  ? ?Socioeconomic History  ? Marital status: Single  ?  Spouse name: Not on file  ? Number of children: Not on file  ? Years of education: Not on file  ? Highest education level: Not on file  ?Occupational History  ? Not on file  ?Tobacco Use  ? Smoking status: Never  ? Smokeless tobacco: Never  ?Vaping Use  ? Vaping Use: Never used  ?Substance and Sexual Activity  ? Alcohol use: Never  ? Drug use: Never  ? Sexual activity: Yes  ?  Birth control/protection: Post-menopausal  ?Other Topics Concern  ? Not on file  ?Social History Narrative  ? Not on file  ? ?Social Determinants of Health  ? ?Financial Resource Strain: Not on file  ?Food Insecurity: Not on file  ?Transportation Needs: Not on file  ?Physical Activity: Not on file  ?Stress: Not on file  ?Social Connections: Not on file  ?Intimate Partner Violence: Not on file  ? ? ?PHYSICAL EXAM ? ?GENERAL EXAM/CONSTITUTIONAL: ?Vitals:  ?Vitals:  ? 08/01/21 1509  ?BP: (!) 158/86  ?Pulse: 93   ?Weight: 179 lb (81.2 kg)  ?Height: '5\' 2"'$  (1.575 m)  ? ?Body mass index is 32.74 kg/m?. ?Wt Readings from Last 3 Encounters:  ?08/01/21 179 lb (81.2 kg)  ?02/14/21 182 lb 15.7 oz (83 kg)  ?01/26/21 183 l

## 2021-08-01 NOTE — Patient Instructions (Signed)
Start rivastigmine 3 mg daily for 1 week then increase to 3 mg twice daily ?We will obtain thyroid function test and vitamin B12 level ?Follow-up in 17-month, at that time we will consider increasing the rivastigmine or adding Namenda. ?

## 2021-08-02 LAB — VITAMIN B12: Vitamin B-12: 309 pg/mL (ref 232–1245)

## 2021-08-02 LAB — TSH: TSH: 2.89 u[IU]/mL (ref 0.450–4.500)

## 2021-08-15 DIAGNOSIS — R600 Localized edema: Secondary | ICD-10-CM | POA: Diagnosis not present

## 2021-08-15 DIAGNOSIS — R229 Localized swelling, mass and lump, unspecified: Secondary | ICD-10-CM | POA: Diagnosis not present

## 2021-08-15 DIAGNOSIS — N3281 Overactive bladder: Secondary | ICD-10-CM | POA: Diagnosis not present

## 2021-08-15 DIAGNOSIS — G301 Alzheimer's disease with late onset: Secondary | ICD-10-CM | POA: Diagnosis not present

## 2021-08-21 DIAGNOSIS — R3915 Urgency of urination: Secondary | ICD-10-CM | POA: Diagnosis not present

## 2021-08-21 DIAGNOSIS — N3281 Overactive bladder: Secondary | ICD-10-CM | POA: Diagnosis not present

## 2021-09-05 DIAGNOSIS — M545 Low back pain, unspecified: Secondary | ICD-10-CM | POA: Diagnosis not present

## 2021-09-05 DIAGNOSIS — S39012A Strain of muscle, fascia and tendon of lower back, initial encounter: Secondary | ICD-10-CM | POA: Diagnosis not present

## 2021-09-05 DIAGNOSIS — G301 Alzheimer's disease with late onset: Secondary | ICD-10-CM | POA: Diagnosis not present

## 2021-09-05 DIAGNOSIS — W19XXXA Unspecified fall, initial encounter: Secondary | ICD-10-CM | POA: Diagnosis not present

## 2021-09-07 DIAGNOSIS — K219 Gastro-esophageal reflux disease without esophagitis: Secondary | ICD-10-CM | POA: Diagnosis not present

## 2021-09-07 DIAGNOSIS — R1010 Upper abdominal pain, unspecified: Secondary | ICD-10-CM | POA: Diagnosis not present

## 2021-09-07 DIAGNOSIS — K760 Fatty (change of) liver, not elsewhere classified: Secondary | ICD-10-CM | POA: Diagnosis not present

## 2021-09-07 DIAGNOSIS — K639 Disease of intestine, unspecified: Secondary | ICD-10-CM | POA: Diagnosis not present

## 2021-09-13 ENCOUNTER — Ambulatory Visit: Payer: Self-pay | Admitting: Surgery

## 2021-09-13 DIAGNOSIS — R2231 Localized swelling, mass and lump, right upper limb: Secondary | ICD-10-CM | POA: Diagnosis not present

## 2021-09-13 NOTE — H&P (Signed)
Lindsey Baldwin [id number]   Referring Provider:  Laurann Montana, MD   Subjective   Chief Complaint: No chief complaint on file.     History of Present Illness: 76 year old woman known to me following robotic cholecystectomy in November 2022 with history of hypertension, hyperlipidemia, diabetes, overactive bladder, mild dementia, osteoporosis and history of right shoulder fracture, osteoarthritis, obstructive sleep apnea, coronary artery disease who is referred for evaluation of a possible lipoma on the right shoulder.  Has been present for about a month.  Reports it is increasing in size and painful.  Is interested in having it removed.     Review of Systems: A complete review of systems was obtained from the patient.  I have reviewed this information and discussed as appropriate with the patient.  See HPI as well for other ROS.   Medical History: Past Medical History:  Diagnosis Date   Arthritis    Diabetes mellitus without complication (CMS-HCC)    Glaucoma (increased eye pressure)    Sleep apnea     There is no problem list on file for this patient.   Past Surgical History:  Procedure Laterality Date   TONSILLECTOMY       Allergies  Allergen Reactions   Penicillins Swelling and Other (See Comments)    Has patient had a PCN reaction causing immediate rash, facial/tongue/throat swelling, SOB or lightheadedness with hypotension: YES Has patient had a PCN reaction causing severe rash involving mucus membranes or skin necrosis: NO Has patient had a PCN reaction that required hospitalization: NO Has patient had a PCN reaction occurring within the last 10 years: NO If all of the above answers are "NO", then may proceed with Cephalosporin use.    Bimatoprost Itching   Dorzolamide-Timolol Itching   Sulfa (Sulfonamide Antibiotics) Rash    Current Outpatient Medications on File Prior to Visit  Medication Sig Dispense Refill   acetaminophen (TYLENOL) 500 MG tablet  Take by mouth     ALPRAZolam (XANAX) 0.5 MG tablet alprazolam 0.5 mg tablet     aspirin 81 MG chewable tablet Take 81 mg by mouth once daily     atorvastatin (LIPITOR) 40 MG tablet atorvastatin 40 mg tablet     calcium carbonate (TUMS) 200 mg calcium (500 mg) chewable tablet Take by mouth     enalapril (VASOTEC) 10 MG tablet      famotidine (PEPCID) 20 MG tablet Take 20 mg by mouth once daily     insulin ASPART (NOVOLOG) injection (concentration 100 units/mL) 0-15 Units, Subcutaneous, 3 times daily with meals CBG < 70: Implement Hypoglycemia protocol CBG 70 - 120: 0 units CBG 121 - 150: 2 units CBG 151 - 200: 3 units CBG 201 - 250: 5 units CBG 251 - 300: 8 units CBG 301 - 350: 11 units CBG 351 - 400: 15 units CBG > 400: call MD     latanoprost (XALATAN) 0.005 % ophthalmic solution latanoprost 0.005 % eye drops     metFORMIN (GLUCOPHAGE) 1000 MG tablet metformin 1,000 mg tablet     metoprolol tartrate (LOPRESSOR) 25 MG tablet Take 12.5 mg by mouth 3 (three) times daily as needed     mirabegron (MYRBETRIQ) 50 mg ER tablet Myrbetriq 50 mg tablet,extended release     multivitamin with minerals tablet Take 1 tablet by mouth once daily     ondansetron (ZOFRAN) 8 MG tablet      pantoprazole (PROTONIX) 40 MG DR tablet      SITagliptin (JANUVIA) 100 MG  tablet Take 100 mg by mouth every morning     tamsulosin (FLOMAX) 0.4 mg capsule Take 1 capsule by mouth once daily     timoloL maleate (TIMOPTIC) 0.5 % ophthalmic solution timolol maleate 0.5 % eye drops     No current facility-administered medications on file prior to visit.    No family history on file.   Social History   Tobacco Use  Smoking Status Never  Smokeless Tobacco Never     Social History   Socioeconomic History   Marital status: Single  Tobacco Use   Smoking status: Never   Smokeless tobacco: Never  Vaping Use   Vaping Use: Never used  Substance and Sexual Activity   Alcohol use: Never   Drug use: Never     Objective:    There were no vitals filed for this visit.  There is no height or weight on file to calculate BMI.  Alert, calm, cooperative Unlabored respirations Smooth mobile subcutaneous mass on the anterior right shoulder at the lateral aspect of the clavicle, approximately by 7 cm, minimally tender.  Assessment and Plan:  Diagnoses and all orders for this visit:  Subcutaneous mass    Consistent with lipoma on exam.  We discussed options of ongoing observation versus excision under MAC.  Discussed risks of surgery including bleeding, infection, pain, scarring, injury to underlying structures, seroma/hematoma, recurrence of the lesion, and discussed that this is unlikely to resolve all of her shoulder pain.  Questions welcomed and answered.  I also discussed this with her caretaker, Seth Bake, on the phone.  Patient wishes to proceed with surgery.   Callum Wolf Raquel James, MD

## 2021-10-24 DIAGNOSIS — E1140 Type 2 diabetes mellitus with diabetic neuropathy, unspecified: Secondary | ICD-10-CM | POA: Diagnosis not present

## 2021-10-24 DIAGNOSIS — I700 Atherosclerosis of aorta: Secondary | ICD-10-CM | POA: Diagnosis not present

## 2021-10-24 DIAGNOSIS — R600 Localized edema: Secondary | ICD-10-CM | POA: Diagnosis not present

## 2021-10-24 DIAGNOSIS — E7800 Pure hypercholesterolemia, unspecified: Secondary | ICD-10-CM | POA: Diagnosis not present

## 2021-10-24 DIAGNOSIS — H409 Unspecified glaucoma: Secondary | ICD-10-CM | POA: Diagnosis not present

## 2021-10-24 DIAGNOSIS — I10 Essential (primary) hypertension: Secondary | ICD-10-CM | POA: Diagnosis not present

## 2021-10-24 DIAGNOSIS — G4733 Obstructive sleep apnea (adult) (pediatric): Secondary | ICD-10-CM | POA: Diagnosis not present

## 2021-10-24 DIAGNOSIS — Z0000 Encounter for general adult medical examination without abnormal findings: Secondary | ICD-10-CM | POA: Diagnosis not present

## 2021-10-24 DIAGNOSIS — G309 Alzheimer's disease, unspecified: Secondary | ICD-10-CM | POA: Diagnosis not present

## 2021-10-24 DIAGNOSIS — M810 Age-related osteoporosis without current pathological fracture: Secondary | ICD-10-CM | POA: Diagnosis not present

## 2021-10-24 DIAGNOSIS — K219 Gastro-esophageal reflux disease without esophagitis: Secondary | ICD-10-CM | POA: Diagnosis not present

## 2021-10-30 DIAGNOSIS — H401113 Primary open-angle glaucoma, right eye, severe stage: Secondary | ICD-10-CM | POA: Diagnosis not present

## 2021-11-02 NOTE — Patient Instructions (Signed)
SURGICAL WAITING ROOM VISITATION Patients having surgery or a procedure may have no more than 2 support people in the waiting area - these visitors may rotate.   Children under the age of 86 must have an adult with them who is not the patient. If the patient needs to stay at the hospital during part of their recovery, the visitor guidelines for inpatient rooms apply. Pre-op nurse will coordinate an appropriate time for 1 support person to accompany patient in pre-op.  This support person may not rotate.    Please refer to the [facility] website for the visitor guidelines for Inpatients (after your surgery is over and you are in a regular room).       Your procedure is scheduled on:  11/15/2021    Report to [facility] Main Entrance    Report to admitting at  [place] AM   Call this number if you have problems the morning of surgery [phone]   Do not eat food :After Midnight.   After Midnight you may have the following liquids until ___0530 am  DAY OF SURGERY  Water Non-Citrus Juices (without pulp, NO RED) Carbonated Beverages Black Coffee (NO MILK/CREAM OR CREAMERS, sugar ok)  Clear Tea (NO MILK/CREAM OR CREAMERS, sugar ok) regular and decaf                             Plain Jell-O (NO RED)                                           Fruit ices (not with fruit pulp, NO RED)                                     Popsicles (NO RED)                                                               Sports drinks like Gatorade (NO RED)                        Oral Hygiene is also important to reduce your risk of infection.                                    Remember - BRUSH YOUR TEETH THE MORNING OF SURGERY WITH YOUR REGULAR TOOTHPASTE   Do NOT smoke after Midnight   Take these medicines the morning of surgery with A SIP OF WATER:  vesicare, myrbetriq, eye drops as usual   DO NOT TAKE ANY ORAL DIABETIC MEDICATIONS DAY OF YOUR SURGERY  Bring CPAP mask and tubing day of  surgery.                              You may not have any metal on your body including hair pins, jewelry, and body piercing             Do not  wear make-up, lotions, powders, perfumes/cologne, or deodorant  Do not wear nail polish including gel and S&S, artificial/acrylic nails, or any other type of covering on natural nails including finger and toenails. If you have artificial nails, gel coating, etc. that needs to be removed by a nail salon please have this removed prior to surgery or surgery may need to be canceled/ delayed if the surgeon/ anesthesia feels like they are unable to be safely monitored.   Do not shave  48 hours prior to surgery.               Men may shave face and neck.   Do not bring valuables to the hospital. [place].   Contacts, dentures or bridgework may not be worn into surgery.   Bring small overnight bag day of surgery.   DO NOT [place]. PHARMACY WILL DISPENSE MEDICATIONS LISTED ON YOUR MEDICATION LIST TO YOU DURING YOUR ADMISSION [place]!    Patients discharged on the day of surgery will not be allowed to drive home.  Someone NEEDS to stay with you for the first 24 hours after anesthesia.   Special Instructions: Bring a copy of your healthcare power of attorney and living will documents         the day of surgery if you haven't scanned them before.              Please read over the following fact sheets you were given: IF YOU HAVE QUESTIONS ABOUT YOUR PRE-OP INSTRUCTIONS PLEASE CALL [phone]     [facility] - Preparing for Surgery Before surgery, you can play an important role.  Because skin is not sterile, your skin needs to be as free of germs as possible.  You can reduce the number of germs on your skin by washing with CHG (chlorahexidine gluconate) soap before surgery.  CHG is an antiseptic cleaner which kills germs and bonds with the skin to continue killing  germs even after washing. Please DO NOT use if you have an allergy to CHG or antibacterial soaps.  If your skin becomes reddened/irritated stop using the CHG and inform your nurse when you arrive at Short Stay. Do not shave (including legs and underarms) for at least 48 hours prior to the first CHG shower.  You may shave your face/neck. Please follow these instructions carefully:  1.  Shower with CHG Soap the night before surgery and the  morning of Surgery.  2.  If you choose to wash your hair, wash your hair first as usual with your  normal  shampoo.  3.  After you shampoo, rinse your hair and body thoroughly to remove the  shampoo.                           4.  Use CHG as you would any other liquid soap.  You can apply chg directly  to the skin and wash                       Gently with a scrungie or clean washcloth.  5.  Apply the CHG Soap to your body ONLY FROM THE NECK DOWN.   Do not use on face/ open  Wound or open sores. Avoid contact with eyes, ears mouth and genitals (private parts).                       Wash face,  Genitals (private parts) with your normal soap.             6.  Wash thoroughly, paying special attention to the area where your surgery  will be performed.  7.  Thoroughly rinse your body with warm water from the neck down.  8.  DO NOT shower/wash with your normal soap after using and rinsing off  the CHG Soap.                9.  Pat yourself dry with a clean towel.            10.  Wear clean pajamas.            11.  Place clean sheets on your bed the night of your first shower and do not  sleep with pets. Day of Surgery : Do not apply any lotions/deodorants the morning of surgery.  Please wear clean clothes to the hospital/surgery center.  FAILURE TO FOLLOW THESE INSTRUCTIONS MAY RESULT IN THE CANCELLATION OF YOUR SURGERY PATIENT SIGNATURE_________________________________  NURSE  SIGNATURE__________________________________  ________________________________________________________________________

## 2021-11-02 NOTE — Progress Notes (Addendum)
Anesthesia Review:  PCP: Pershing Cox  Cardiologist : Chest x-ray : EKG : 01/26/21  Echo : Stress test: Cardiac Cath :  Activity level:  Sleep Study/ CPAP : Fasting Blood Sugar :      / Checks Blood Sugar -- times a day:   Blood Thinner/ Instructions /Last Dose: ASA / Instructions/ Last Dose :   81 mg aspirin  DM- type  2 Checks glucose once daily in am  Hgba1c-  11/07/21- 8.0 - roiuted to Dr Romana Juniper on 11/07/21.  1005am- pt not at preop .  Called and LVMM at [phone] .  PT was 30 minutes late for preop appt.  Med hx and preop instructions completed along with vital signs and labs.   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  POA called at 1445 on 11/07/21 and stated she would be the one to bring pt for surgery .  - Name- Lindsey Baldwin- She is pt's POA .   PT came for preop along and answered all questions appropriately and was alert and oreinted x 3.  Pt left with copy of preop instructions.  Informed Lindsey Baldwin that pt had copy of preop instructions.

## 2021-11-07 ENCOUNTER — Encounter (HOSPITAL_COMMUNITY): Payer: Self-pay

## 2021-11-07 ENCOUNTER — Encounter (HOSPITAL_COMMUNITY)
Admission: RE | Admit: 2021-11-07 | Discharge: 2021-11-07 | Disposition: A | Discharge status: Home / Self Care | Payer: Medicare Other | Source: Ambulatory Visit | Attending: Surgery | Admitting: Surgery

## 2021-11-07 ENCOUNTER — Other Ambulatory Visit: Payer: Self-pay

## 2021-11-07 DIAGNOSIS — I10 Essential (primary) hypertension: Secondary | ICD-10-CM

## 2021-11-07 DIAGNOSIS — Z01812 Encounter for preprocedural laboratory examination: Secondary | ICD-10-CM | POA: Diagnosis not present

## 2021-11-07 DIAGNOSIS — E1165 Type 2 diabetes mellitus with hyperglycemia: Secondary | ICD-10-CM | POA: Diagnosis not present

## 2021-11-07 DIAGNOSIS — R1312 Dysphagia, oropharyngeal phase: Secondary | ICD-10-CM | POA: Diagnosis not present

## 2021-11-07 DIAGNOSIS — R1310 Dysphagia, unspecified: Secondary | ICD-10-CM | POA: Diagnosis not present

## 2021-11-07 LAB — CBC
HCT: 40.1 % (ref 36.0–46.0)
Hemoglobin: 13.2 g/dL (ref 12.0–15.0)
MCH: 30.2 pg (ref 26.0–34.0)
MCHC: 32.9 g/dL (ref 30.0–36.0)
MCV: 91.8 fL (ref 80.0–100.0)
Platelets: 168 10*3/uL (ref 150–400)
RBC: 4.37 MIL/uL (ref 3.87–5.11)
RDW: 12.8 % (ref 11.5–15.5)
WBC: 8 10*3/uL (ref 4.0–10.5)
nRBC: 0 % (ref 0.0–0.2)

## 2021-11-07 LAB — BASIC METABOLIC PANEL
Anion gap: 7 (ref 5–15)
BUN: 14 mg/dL (ref 8–23)
CO2: 24 mmol/L (ref 22–32)
Calcium: 9.6 mg/dL (ref 8.9–10.3)
Chloride: 107 mmol/L (ref 98–111)
Creatinine, Ser: 0.95 mg/dL (ref 0.44–1.00)
GFR, Estimated: >60 mL/min (ref >60)
Glucose, Bld: 189 mg/dL — ABNORMAL HIGH (ref 70–99)
Potassium: 4.6 mmol/L (ref 3.5–5.1)
Sodium: 138 mmol/L (ref 135–145)

## 2021-11-07 LAB — HEMOGLOBIN A1C
Hgb A1c MFr Bld: 8 % — ABNORMAL HIGH (ref 4.8–5.6)
Mean Plasma Glucose: 182.9 mg/dL

## 2021-11-07 LAB — GLUCOSE, CAPILLARY: Glucose-Capillary: 178 mg/dL — ABNORMAL HIGH (ref 70–99)

## 2021-11-08 DIAGNOSIS — R2689 Other abnormalities of gait and mobility: Secondary | ICD-10-CM | POA: Diagnosis not present

## 2021-11-08 DIAGNOSIS — R296 Repeated falls: Secondary | ICD-10-CM | POA: Diagnosis not present

## 2021-11-09 DIAGNOSIS — R1310 Dysphagia, unspecified: Secondary | ICD-10-CM | POA: Diagnosis not present

## 2021-11-09 DIAGNOSIS — R1312 Dysphagia, oropharyngeal phase: Secondary | ICD-10-CM | POA: Diagnosis not present

## 2021-11-10 DIAGNOSIS — M6281 Muscle weakness (generalized): Secondary | ICD-10-CM | POA: Diagnosis not present

## 2021-11-10 DIAGNOSIS — R2689 Other abnormalities of gait and mobility: Secondary | ICD-10-CM | POA: Diagnosis not present

## 2021-11-10 DIAGNOSIS — R278 Other lack of coordination: Secondary | ICD-10-CM | POA: Diagnosis not present

## 2021-11-10 DIAGNOSIS — R296 Repeated falls: Secondary | ICD-10-CM | POA: Diagnosis not present

## 2021-11-13 DIAGNOSIS — R1312 Dysphagia, oropharyngeal phase: Secondary | ICD-10-CM | POA: Diagnosis not present

## 2021-11-13 DIAGNOSIS — R1310 Dysphagia, unspecified: Secondary | ICD-10-CM | POA: Diagnosis not present

## 2021-11-14 DIAGNOSIS — R278 Other lack of coordination: Secondary | ICD-10-CM | POA: Diagnosis not present

## 2021-11-14 DIAGNOSIS — M6281 Muscle weakness (generalized): Secondary | ICD-10-CM | POA: Diagnosis not present

## 2021-11-14 DIAGNOSIS — R296 Repeated falls: Secondary | ICD-10-CM | POA: Diagnosis not present

## 2021-11-14 NOTE — Anesthesia Preprocedure Evaluation (Signed)
Anesthesia Evaluation  Patient identified by MRN, date of birth, ID band Patient awake    Reviewed: Allergy & Precautions, H&P , NPO status , Patient's Chart, lab work & pertinent test results  Airway Mallampati: III  TM Distance: >3 FB Neck ROM: Full    Dental no notable dental hx. (+) Teeth Intact, Dental Advisory Given   Pulmonary sleep apnea ,    Pulmonary exam normal breath sounds clear to auscultation       Cardiovascular Exercise Tolerance: Good hypertension, Pt. on medications  Rhythm:Regular Rate:Normal     Neuro/Psych negative neurological ROS  negative psych ROS   GI/Hepatic Neg liver ROS, GERD  Medicated,  Endo/Other  diabetes, Insulin Dependent, Oral Hypoglycemic Agents  Renal/GU negative Renal ROS  negative genitourinary   Musculoskeletal  (+) Arthritis , Osteoarthritis,    Abdominal   Peds  Hematology negative hematology ROS (+)   Anesthesia Other Findings   Reproductive/Obstetrics negative OB ROS                            Anesthesia Physical Anesthesia Plan  ASA: 3  Anesthesia Plan: MAC   Post-op Pain Management: Tylenol PO (pre-op)*   Induction: Intravenous  PONV Risk Score and Plan: 3 and Ondansetron, Propofol infusion and Treatment may vary due to age or medical condition  Airway Management Planned: Natural Airway and Simple Face Mask  Additional Equipment:   Intra-op Plan:   Post-operative Plan:   Informed Consent: I have reviewed the patients History and Physical, chart, labs and discussed the procedure including the risks, benefits and alternatives for the proposed anesthesia with the patient or authorized representative who has indicated his/her understanding and acceptance.     Dental advisory given  Plan Discussed with: CRNA  Anesthesia Plan Comments:        Anesthesia Quick Evaluation

## 2021-11-15 ENCOUNTER — Encounter (HOSPITAL_COMMUNITY): Payer: Self-pay | Admitting: Surgery

## 2021-11-15 ENCOUNTER — Encounter (HOSPITAL_COMMUNITY)
Admission: RE | Disposition: A | Discharge status: Home / Self Care | Payer: Self-pay | Source: Home / Self Care | Attending: Surgery

## 2021-11-15 ENCOUNTER — Ambulatory Visit (HOSPITAL_BASED_OUTPATIENT_CLINIC_OR_DEPARTMENT_OTHER): Payer: Medicare Other | Admitting: Anesthesiology

## 2021-11-15 ENCOUNTER — Other Ambulatory Visit: Payer: Self-pay

## 2021-11-15 ENCOUNTER — Ambulatory Visit (HOSPITAL_COMMUNITY)
Admission: RE | Admit: 2021-11-15 | Discharge: 2021-11-15 | Disposition: A | Discharge status: Home / Self Care | Payer: Medicare Other | Attending: Surgery | Admitting: Surgery

## 2021-11-15 ENCOUNTER — Ambulatory Visit (HOSPITAL_COMMUNITY): Payer: Medicare Other | Admitting: Emergency Medicine

## 2021-11-15 DIAGNOSIS — D171 Benign lipomatous neoplasm of skin and subcutaneous tissue of trunk: Secondary | ICD-10-CM | POA: Diagnosis not present

## 2021-11-15 DIAGNOSIS — R2231 Localized swelling, mass and lump, right upper limb: Secondary | ICD-10-CM

## 2021-11-15 DIAGNOSIS — E1165 Type 2 diabetes mellitus with hyperglycemia: Secondary | ICD-10-CM

## 2021-11-15 DIAGNOSIS — F03A0 Unspecified dementia, mild, without behavioral disturbance, psychotic disturbance, mood disturbance, and anxiety: Secondary | ICD-10-CM | POA: Insufficient documentation

## 2021-11-15 DIAGNOSIS — Z7984 Long term (current) use of oral hypoglycemic drugs: Secondary | ICD-10-CM

## 2021-11-15 DIAGNOSIS — K219 Gastro-esophageal reflux disease without esophagitis: Secondary | ICD-10-CM | POA: Diagnosis not present

## 2021-11-15 DIAGNOSIS — E119 Type 2 diabetes mellitus without complications: Secondary | ICD-10-CM | POA: Diagnosis not present

## 2021-11-15 DIAGNOSIS — E785 Hyperlipidemia, unspecified: Secondary | ICD-10-CM | POA: Insufficient documentation

## 2021-11-15 DIAGNOSIS — Z794 Long term (current) use of insulin: Secondary | ICD-10-CM

## 2021-11-15 DIAGNOSIS — M25511 Pain in right shoulder: Secondary | ICD-10-CM | POA: Diagnosis not present

## 2021-11-15 DIAGNOSIS — I10 Essential (primary) hypertension: Secondary | ICD-10-CM | POA: Diagnosis not present

## 2021-11-15 DIAGNOSIS — G4733 Obstructive sleep apnea (adult) (pediatric): Secondary | ICD-10-CM | POA: Insufficient documentation

## 2021-11-15 DIAGNOSIS — Z9049 Acquired absence of other specified parts of digestive tract: Secondary | ICD-10-CM | POA: Insufficient documentation

## 2021-11-15 DIAGNOSIS — D1721 Benign lipomatous neoplasm of skin and subcutaneous tissue of right arm: Secondary | ICD-10-CM | POA: Diagnosis not present

## 2021-11-15 HISTORY — PX: EXCISION MASS UPPER EXTREMETIES: SHX6704

## 2021-11-15 LAB — GLUCOSE, CAPILLARY
Glucose-Capillary: 200 mg/dL — ABNORMAL HIGH (ref 70–99)
Glucose-Capillary: 193 mg/dL — ABNORMAL HIGH (ref 70–99)

## 2021-11-15 SURGERY — EXCISION MASS UPPER EXTREMITIES
Anesthesia: Monitor Anesthesia Care | Laterality: Right

## 2021-11-15 MED ORDER — SODIUM CHLORIDE 0.9 % IV SOLN: 250.0000 mL | INTRAVENOUS | Status: DC | PRN

## 2021-11-15 MED ORDER — PROPOFOL 10 MG/ML IV BOLUS
INTRAVENOUS | Status: AC
  Filled 2021-11-15: qty 20

## 2021-11-15 MED ORDER — LIDOCAINE 2% (20 MG/ML) 5 ML SYRINGE
INTRAMUSCULAR | Status: DC | PRN
  Administered 2021-11-15: 100 mg via INTRAVENOUS

## 2021-11-15 MED ORDER — TRAMADOL HCL 50 MG PO TABS: 50.0000 mg | ORAL_TABLET | Freq: Four times a day (QID) | ORAL | 0 refills | Status: DC | PRN

## 2021-11-15 MED ORDER — FENTANYL CITRATE (PF) 100 MCG/2ML IJ SOLN
INTRAMUSCULAR | Status: DC | PRN
  Administered 2021-11-15 (×2): 25 ug via INTRAVENOUS

## 2021-11-15 MED ORDER — ORAL CARE MOUTH RINSE: 15.0000 mL | Freq: Once | OROMUCOSAL | Status: AC

## 2021-11-15 MED ORDER — SODIUM CHLORIDE 0.9% FLUSH: 3.0000 mL | INTRAVENOUS | Status: DC | PRN

## 2021-11-15 MED ORDER — VANCOMYCIN HCL IN DEXTROSE 1-5 GM/200ML-% IV SOLN
1000.0000 mg | INTRAVENOUS | Status: AC
  Administered 2021-11-15 (×2): 1000 mg via INTRAVENOUS
  Filled 2021-11-15: qty 200

## 2021-11-15 MED ORDER — PROPOFOL 10 MG/ML IV BOLUS
INTRAVENOUS | Status: DC | PRN
  Administered 2021-11-15: 10 mg via INTRAVENOUS

## 2021-11-15 MED ORDER — MIDAZOLAM HCL 2 MG/2ML IJ SOLN
INTRAMUSCULAR | Status: AC
  Filled 2021-11-15: qty 2

## 2021-11-15 MED ORDER — FENTANYL CITRATE PF 50 MCG/ML IJ SOSY: 25.0000 ug | PREFILLED_SYRINGE | INTRAMUSCULAR | Status: DC | PRN

## 2021-11-15 MED ORDER — MIDAZOLAM HCL 5 MG/5ML IJ SOLN
INTRAMUSCULAR | Status: DC | PRN
  Administered 2021-11-15: 2 mg via INTRAVENOUS

## 2021-11-15 MED ORDER — BUPIVACAINE-EPINEPHRINE (PF) 0.5% -1:200000 IJ SOLN
INTRAMUSCULAR | Status: AC
Start: 2021-11-15 — End: ?
  Filled 2021-11-15: qty 30

## 2021-11-15 MED ORDER — LACTATED RINGERS IV SOLN: INTRAVENOUS | Status: DC

## 2021-11-15 MED ORDER — ACETAMINOPHEN 500 MG PO TABS
1000.0000 mg | ORAL_TABLET | ORAL | Status: AC
  Administered 2021-11-15: 1000 mg via ORAL
  Filled 2021-11-15: qty 2

## 2021-11-15 MED ORDER — LIDOCAINE 2% (20 MG/ML) 5 ML SYRINGE
INTRAMUSCULAR | Status: AC
  Filled 2021-11-15: qty 5

## 2021-11-15 MED ORDER — OXYCODONE HCL 5 MG PO TABS: 5.0000 mg | ORAL_TABLET | ORAL | Status: DC | PRN

## 2021-11-15 MED ORDER — 0.9 % SODIUM CHLORIDE (POUR BTL) OPTIME
TOPICAL | Status: DC | PRN
  Administered 2021-11-15: 1000 mL

## 2021-11-15 MED ORDER — PROPOFOL 500 MG/50ML IV EMUL
INTRAVENOUS | Status: DC | PRN
  Administered 2021-11-15: 50 ug/kg/min via INTRAVENOUS

## 2021-11-15 MED ORDER — CHLORHEXIDINE GLUCONATE 4 % EX LIQD: 60.0000 mL | Freq: Once | CUTANEOUS | Status: DC

## 2021-11-15 MED ORDER — PROPOFOL 500 MG/50ML IV EMUL
INTRAVENOUS | Status: AC
  Filled 2021-11-15: qty 50

## 2021-11-15 MED ORDER — ACETAMINOPHEN 500 MG PO TABS: 1000.0000 mg | ORAL_TABLET | Freq: Once | ORAL | Status: DC

## 2021-11-15 MED ORDER — ONDANSETRON HCL 4 MG/2ML IJ SOLN
INTRAMUSCULAR | Status: AC
  Filled 2021-11-15: qty 2

## 2021-11-15 MED ORDER — BUPIVACAINE-EPINEPHRINE (PF) 0.5% -1:200000 IJ SOLN
INTRAMUSCULAR | Status: DC | PRN
  Administered 2021-11-15: 10 mL

## 2021-11-15 MED ORDER — ONDANSETRON HCL 4 MG/2ML IJ SOLN
INTRAMUSCULAR | Status: DC | PRN
  Administered 2021-11-15: 4 mg via INTRAVENOUS

## 2021-11-15 MED ORDER — CHLORHEXIDINE GLUCONATE 0.12 % MT SOLN
15.0000 mL | Freq: Once | OROMUCOSAL | Status: AC
  Administered 2021-11-15: 15 mL via OROMUCOSAL

## 2021-11-15 MED ORDER — FENTANYL CITRATE (PF) 100 MCG/2ML IJ SOLN
INTRAMUSCULAR | Status: AC
  Filled 2021-11-15: qty 2

## 2021-11-15 MED ORDER — SODIUM CHLORIDE 0.9% FLUSH: 3.0000 mL | Freq: Two times a day (BID) | INTRAVENOUS | Status: DC

## 2021-11-15 MED ORDER — ACETAMINOPHEN 325 MG PO TABS: 650.0000 mg | ORAL_TABLET | ORAL | Status: DC | PRN

## 2021-11-15 MED ORDER — DEXAMETHASONE SODIUM PHOSPHATE 10 MG/ML IJ SOLN
INTRAMUSCULAR | Status: DC | PRN
  Administered 2021-11-15: 10 mg via INTRAVENOUS

## 2021-11-15 MED ORDER — ACETAMINOPHEN 650 MG RE SUPP: 650.0000 mg | RECTAL | Status: DC | PRN

## 2021-11-15 MED ORDER — DEXAMETHASONE SODIUM PHOSPHATE 10 MG/ML IJ SOLN
INTRAMUSCULAR | Status: AC
  Filled 2021-11-15: qty 1

## 2021-11-15 SURGICAL SUPPLY — 31 items
ADH SKN CLS APL DERMABOND .7 (GAUZE/BANDAGES/DRESSINGS) ×1
BAG COUNTER SPONGE SURGICOUNT (BAG) IMPLANT
BAG SPNG CNTER NS LX DISP (BAG)
COVER SURGICAL LIGHT HANDLE (MISCELLANEOUS) ×2 IMPLANT
DERMABOND ADVANCED (GAUZE/BANDAGES/DRESSINGS) ×1
DERMABOND ADVANCED .7 DNX12 (GAUZE/BANDAGES/DRESSINGS) IMPLANT
DRAPE LAPAROSCOPIC ABDOMINAL (DRAPES) IMPLANT
DRAPE LAPAROTOMY T 102X78X121 (DRAPES) ×1 IMPLANT
DRAPE LAPAROTOMY TRNSV 102X78 (DRAPES) IMPLANT
DRAPE UTILITY XL STRL (DRAPES) ×2 IMPLANT
ELECT REM PT RETURN 15FT ADLT (MISCELLANEOUS) ×2 IMPLANT
GAUZE SPONGE 4X4 12PLY STRL (GAUZE/BANDAGES/DRESSINGS) ×1 IMPLANT
GLOVE BIO SURGEON STRL SZ 6 (GLOVE) ×2 IMPLANT
GLOVE INDICATOR 6.5 STRL GRN (GLOVE) ×2 IMPLANT
GOWN STRL REUS W/ TWL LRG LVL3 (GOWN DISPOSABLE) ×1 IMPLANT
GOWN STRL REUS W/ TWL XL LVL3 (GOWN DISPOSABLE) IMPLANT
GOWN STRL REUS W/TWL LRG LVL3 (GOWN DISPOSABLE) ×2
GOWN STRL REUS W/TWL XL LVL3 (GOWN DISPOSABLE)
KIT BASIN OR (CUSTOM PROCEDURE TRAY) ×2 IMPLANT
KIT TURNOVER KIT A (KITS) ×1 IMPLANT
MARKER SKIN DUAL TIP RULER LAB (MISCELLANEOUS) IMPLANT
NEEDLE HYPO 22GX1.5 SAFETY (NEEDLE) ×2 IMPLANT
PACK GENERAL/GYN (CUSTOM PROCEDURE TRAY) ×2 IMPLANT
SPIKE FLUID TRANSFER (MISCELLANEOUS) IMPLANT
STAPLER VISISTAT 35W (STAPLE) IMPLANT
SUT MNCRL AB 4-0 PS2 18 (SUTURE) ×2 IMPLANT
SUT VIC AB 3-0 SH 27 (SUTURE) ×2
SUT VIC AB 3-0 SH 27XBRD (SUTURE) ×1 IMPLANT
SYR CONTROL 10ML LL (SYRINGE) ×2 IMPLANT
TOWEL OR 17X26 10 PK STRL BLUE (TOWEL DISPOSABLE) ×2 IMPLANT
TOWEL OR NON WOVEN STRL DISP B (DISPOSABLE) ×2 IMPLANT

## 2021-11-15 NOTE — Anesthesia Postprocedure Evaluation (Signed)
Anesthesia Post Note  Patient: Lindsey Baldwin  Procedure(s) Performed: EXCISION OF SUBCUTANEOUS MASS RIGHT SHOULDER (Right)     Patient location during evaluation: PACU Anesthesia Type: MAC Level of consciousness: awake and alert Pain management: pain level controlled Vital Signs Assessment: post-procedure vital signs reviewed and stable Respiratory status: spontaneous breathing, nonlabored ventilation and respiratory function stable Cardiovascular status: stable and blood pressure returned to baseline Postop Assessment: no apparent nausea or vomiting Anesthetic complications: no   No notable events documented.  Last Vitals:  Vitals:   11/15/21 0915 11/15/21 0930  BP: (!) 152/76 (!) 152/78  Pulse: 67 65  Resp: 17 18  Temp: 36.4 C 36.4 C  SpO2: 96% 93%    Last Pain:  Vitals:   11/15/21 0930  TempSrc:   PainSc: 0-No pain                 Tydus Sanmiguel,W. EDMOND

## 2021-11-15 NOTE — Op Note (Signed)
Operative Note  Lindsey Baldwin  [id number]  [id number]  11/15/2021   Surgeon: Romana Juniper MD FACS   Procedure performed: Excision of subcutaneous mass, right shoulder, 5 x 3 x 3 cm   Preop diagnosis: Subcutaneous mass Post-op diagnosis/intraop findings: Same   Specimens: Right shoulder mass Retained items: No EBL: Minimal cc Complications: none   Description of procedure: After obtaining informed consent the patient was taken to the operating room and placed supine on operating room table where MAC was initiated, preoperative antibiotics were administered, SCDs applied, and a formal timeout was performed.  The right shoulder was prepped and draped in usual sterile fashion.  After infiltration with local (half percent Marcaine with epinephrine), an incision was made along the Langer's lines and the soft tissue dissected with cautery until the surface of the mass was encountered.  The mass was then able to be bluntly separated from its skin attachments to the surrounding soft tissue and was removed intact and handed off for pathology.  A photo was placed in patient's chart for her request.  Hemostasis was ensured in the wound with cautery and then the incision was closed with deep dermal 3-0 Vicryl, subcuticular 4-0 Monocryl and Dermabond.  The patient was then awakened and taken to PACU in stable condition.    All counts were correct at the completion of the case.

## 2021-11-15 NOTE — Transfer of Care (Signed)
Immediate Anesthesia Transfer of Care Note  Patient: Lindsey Baldwin  Procedure(s) Performed: EXCISION OF SUBCUTANEOUS MASS RIGHT SHOULDER (Right)  Patient Location: PACU  Anesthesia Type:MAC  Level of Consciousness: awake and alert   Airway & Oxygen Therapy: Patient Spontanous Breathing and Patient connected to face mask oxygen  Post-op Assessment: Report given to RN and Post -op Vital signs reviewed and stable  Post vital signs: Reviewed and stable  Last Vitals:  Vitals Value Taken Time  BP 142/72 11/15/21 0853  Temp    Pulse 73 11/15/21 0854  Resp 16 11/15/21 0854  SpO2 100 % 11/15/21 0854  Vitals shown include unvalidated device data.  Last Pain:  Vitals:   11/15/21 0642  TempSrc: Oral         Complications: No notable events documented.

## 2021-11-15 NOTE — H&P (Signed)
Lindsey Baldwin [id number]    Referring Provider:  Laurann Montana, MD     Subjective    Chief Complaint: No chief complaint on file.       History of Present Illness: 76 year old woman known to me following robotic cholecystectomy in November 2022 with history of hypertension, hyperlipidemia, diabetes, overactive bladder, mild dementia, osteoporosis and history of right shoulder fracture, osteoarthritis, obstructive sleep apnea, coronary artery disease who is referred for evaluation of a possible lipoma on the right shoulder.  Has been present for about a month.  Reports it is increasing in size and painful.  Is interested in having it removed.         Review of Systems: A complete review of systems was obtained from the patient.  I have reviewed this information and discussed as appropriate with the patient.  See HPI as well for other ROS.     Medical History:     Past Medical History:  Diagnosis Date   Arthritis     Diabetes mellitus without complication (CMS-HCC)     Glaucoma (increased eye pressure)     Sleep apnea        There is no problem list on file for this patient.          Past Surgical History:  Procedure Laterality Date   TONSILLECTOMY               Allergies  Allergen Reactions   Penicillins Swelling and Other (See Comments)      Has patient had a PCN reaction causing immediate rash, facial/tongue/throat swelling, SOB or lightheadedness with hypotension: YES Has patient had a PCN reaction causing severe rash involving mucus membranes or skin necrosis: NO Has patient had a PCN reaction that required hospitalization: NO Has patient had a PCN reaction occurring within the last 10 years: NO If all of the above answers are "NO", then may proceed with Cephalosporin use.     Bimatoprost Itching   Dorzolamide-Timolol Itching   Sulfa (Sulfonamide Antibiotics) Rash            Current Outpatient Medications on File Prior to Visit  Medication Sig  Dispense Refill   acetaminophen (TYLENOL) 500 MG tablet Take by mouth       ALPRAZolam (XANAX) 0.5 MG tablet alprazolam 0.5 mg tablet       aspirin 81 MG chewable tablet Take 81 mg by mouth once daily       atorvastatin (LIPITOR) 40 MG tablet atorvastatin 40 mg tablet       calcium carbonate (TUMS) 200 mg calcium (500 mg) chewable tablet Take by mouth       enalapril (VASOTEC) 10 MG tablet         famotidine (PEPCID) 20 MG tablet Take 20 mg by mouth once daily       insulin ASPART (NOVOLOG) injection (concentration 100 units/mL) 0-15 Units, Subcutaneous, 3 times daily with meals CBG < 70: Implement Hypoglycemia protocol CBG 70 - 120: 0 units CBG 121 - 150: 2 units CBG 151 - 200: 3 units CBG 201 - 250: 5 units CBG 251 - 300: 8 units CBG 301 - 350: 11 units CBG 351 - 400: 15 units CBG > 400: call MD       latanoprost (XALATAN) 0.005 % ophthalmic solution latanoprost 0.005 % eye drops       metFORMIN (GLUCOPHAGE) 1000 MG tablet metformin 1,000 mg tablet       metoprolol tartrate (LOPRESSOR) 25 MG tablet Take  12.5 mg by mouth 3 (three) times daily as needed       mirabegron (MYRBETRIQ) 50 mg ER tablet Myrbetriq 50 mg tablet,extended release       multivitamin with minerals tablet Take 1 tablet by mouth once daily       ondansetron (ZOFRAN) 8 MG tablet         pantoprazole (PROTONIX) 40 MG DR tablet         SITagliptin (JANUVIA) 100 MG tablet Take 100 mg by mouth every morning       tamsulosin (FLOMAX) 0.4 mg capsule Take 1 capsule by mouth once daily       timoloL maleate (TIMOPTIC) 0.5 % ophthalmic solution timolol maleate 0.5 % eye drops        No current facility-administered medications on file prior to visit.      No family history on file.    Social History       Tobacco Use  Smoking Status Never  Smokeless Tobacco Never      Social History        Socioeconomic History   Marital status: Single  Tobacco Use   Smoking status: Never   Smokeless tobacco: Never  Vaping  Use   Vaping Use: Never used  Substance and Sexual Activity   Alcohol use: Never   Drug use: Never      Objective:      There were no vitals filed for this visit.  There is no height or weight on file to calculate BMI.   Alert, calm, cooperative Unlabored respirations Smooth mobile subcutaneous mass on the anterior right shoulder at the lateral aspect of the clavicle, approximately by 7 cm, minimally tender.   Assessment and Plan:  Diagnoses and all orders for this visit:   Subcutaneous mass     Consistent with lipoma on exam.  We discussed options of ongoing observation versus excision under MAC.  Discussed risks of surgery including bleeding, infection, pain, scarring, injury to underlying structures, seroma/hematoma, recurrence of the lesion, and discussed that this is unlikely to resolve all of her shoulder pain.  Questions welcomed and answered.  I also discussed this with her caretaker, Seth Bake, on the phone.  Patient wishes to proceed with surgery.     Shamarie Call Raquel James, MD

## 2021-11-15 NOTE — Discharge Instructions (Addendum)
GENERAL SURGERY: POST OP INSTRUCTIONS  EAT Gradually transition to a high fiber diet with a fiber supplement over the next few weeks after discharge.  Start with a pureed / full liquid diet (see below)  WALK Walk an hour a day (cumulative, not all at once).  Control your pain to do that.    CONTROL PAIN Control pain so that you can walk, sleep, tolerate sneezing/coughing, go up/down stairs.  HAVE A BOWEL MOVEMENT DAILY Keep your bowels regular to avoid problems.  OK to try a laxative to override constipation.  OK to use an antidairrheal to slow down diarrhea.  Call if not better after 2 tries  CALL IF YOU HAVE PROBLEMS/CONCERNS Call if you are still struggling despite following these instructions. Call if you have concerns not answered by these instructions    DIET: Follow a light bland diet & liquids the first 24 hours after arrival home, such as soup, liquids, starches, etc.  Be sure to drink plenty of fluids.  Quickly advance to a usual solid diet within a few days.  Avoid fast food or heavy meals as your are more likely to get nauseated or have irregular bowels.  A low-sugar, high-fiber diet for the rest of your life is ideal.   Take your usually prescribed home medications unless otherwise directed. PAIN CONTROL: Pain is best controlled by a usual combination of three different methods TOGETHER: Ice/Heat Over the counter pain medication Prescription pain medication Most patients will experience some swelling and bruising around the incisions.  Ice packs or heating pads (30-60 minutes up to 6 times a day) will help. Use ice for the first few days to help decrease swelling and bruising, then switch to heat to help relax tight/sore spots and speed recovery.  Some people prefer to use ice alone, heat alone, alternating between ice & heat.  Experiment to what works for you.  Swelling and bruising can take several weeks to resolve.   It is helpful to take an over-the-counter pain  medication regularly for the first few weeks.  Choose one of the following that works best for you: Naproxen (Aleve, etc)  Two 220mg  tabs twice a day OR Ibuprofen (Advil, etc) Three 200mg  tabs four times a day (every meal & bedtime) AND Acetaminophen (Tylenol, etc) 500-650mg  four times a day (every meal & bedtime) A  prescription for pain medication (such as oxycodone, hydrocodone, etc) should be given to you upon discharge.  Take your pain medication as prescribed.  If you are having problems/concerns with the prescription medicine (does not control pain, nausea, vomiting, rash, itching, etc), please call us [phone] to see if we need to switch you to a different pain medicine that will work better for you and/or control your side effect better. If you need a refill on your pain medication, please contact your pharmacy.  They will contact our office to request authorization. Prescriptions will not be filled after 5 pm or on week-ends. Avoid getting constipated.  Between the surgery and the pain medications, it is common to experience some constipation.  Increasing fluid intake and taking a fiber supplement (such as Metamucil, Citrucel, FiberCon, MiraLax, etc) 1-2 times a day regularly will usually help prevent this problem from occurring.  A mild laxative (prune juice, Milk of Magnesia, MiraLax, etc) should be taken according to package directions if there are no bowel movements after 48 hours.   Wash / shower every day, starting 2 days after surgery.  You may shower over the  skin glue which is waterproof.  No rubbing, scrubbing, lotions or ointments to incision.  Do not soak or submerge incision. Glue will flake off after 1-2 weeks.  You may leave the incision open to air.  You may have skin tapes (Steri Strips) covering the incision(s).  Leave them on until one week, then remove.  You may replace a dressing/Band-Aid to cover the incision for comfort if you wish.      ACTIVITIES as tolerated:    You may resume regular (light) daily activities beginning the next day--such as daily self-care, walking, climbing stairs--gradually increasing activities as tolerated.  If you can walk 30 minutes without difficulty, it is safe to try more intense activity such as jogging, treadmill, bicycling, low-impact aerobics, swimming, etc. Save the most intensive and strenuous activity for last such as sit-ups, heavy lifting, contact sports, etc  Refrain from any heavy lifting or straining until you are off narcotics for pain control.   DO NOT PUSH THROUGH PAIN.  Let pain be your guide: If it hurts to do something, don't do it.  Pain is your body warning you to avoid that activity for another week until the pain goes down. You may drive when you are no longer taking prescription pain medication, you can comfortably wear a seatbelt, and you can safely maneuver your car and apply brakes. You may have sexual intercourse when it is comfortable.  FOLLOW UP in our office Please call [facility] at [phone] to set up an appointment to see your surgeon in the office for a follow-up appointment approximately 2-3 weeks after your surgery. Make sure that you call for this appointment the day you arrive home to insure a convenient appointment time. 9. IF YOU HAVE DISABILITY OR FAMILY LEAVE FORMS, BRING THEM TO THE OFFICE FOR PROCESSING.  DO NOT GIVE THEM TO YOUR DOCTOR.   WHEN TO CALL us [phone]: Poor pain control Reactions / problems with new medications (rash/itching, nausea, etc)  Fever over 101.5 F (38.5 C) Worsening swelling or bruising Continued bleeding from incision. Increased pain, redness, or drainage from the incision Difficulty breathing / swallowing   The clinic staff is available to answer your questions during regular business hours (8:30am-5pm).  Please don't hesitate to call and ask to speak to one of our nurses for clinical concerns.   If you have a medical emergency, go to the nearest  emergency room or call 911.  A surgeon from [facility] is always on call at the [facility], [place], [place], [place], [place]  [address] ? MAIN: [phone] ? TOLL FREE: [phone] ?  FAX [phone] [email or web address]

## 2021-11-15 NOTE — Anesthesia Procedure Notes (Signed)
Date/Time: 11/15/2021 8:16 AM  Performed by: Sharlette Dense, CRNAOxygen Delivery Method: Simple face mask

## 2021-11-16 ENCOUNTER — Encounter (HOSPITAL_COMMUNITY): Payer: Self-pay | Admitting: Surgery

## 2021-11-16 DIAGNOSIS — R296 Repeated falls: Secondary | ICD-10-CM | POA: Diagnosis not present

## 2021-11-16 DIAGNOSIS — R278 Other lack of coordination: Secondary | ICD-10-CM | POA: Diagnosis not present

## 2021-11-16 DIAGNOSIS — R1310 Dysphagia, unspecified: Secondary | ICD-10-CM | POA: Diagnosis not present

## 2021-11-16 DIAGNOSIS — R1312 Dysphagia, oropharyngeal phase: Secondary | ICD-10-CM | POA: Diagnosis not present

## 2021-11-16 DIAGNOSIS — M6281 Muscle weakness (generalized): Secondary | ICD-10-CM | POA: Diagnosis not present

## 2021-11-16 LAB — SURGICAL PATHOLOGY

## 2021-11-17 DIAGNOSIS — M6281 Muscle weakness (generalized): Secondary | ICD-10-CM | POA: Diagnosis not present

## 2021-11-17 DIAGNOSIS — R1312 Dysphagia, oropharyngeal phase: Secondary | ICD-10-CM | POA: Diagnosis not present

## 2021-11-17 DIAGNOSIS — R2689 Other abnormalities of gait and mobility: Secondary | ICD-10-CM | POA: Diagnosis not present

## 2021-11-17 DIAGNOSIS — R296 Repeated falls: Secondary | ICD-10-CM | POA: Diagnosis not present

## 2021-11-17 DIAGNOSIS — R278 Other lack of coordination: Secondary | ICD-10-CM | POA: Diagnosis not present

## 2021-11-17 DIAGNOSIS — R1310 Dysphagia, unspecified: Secondary | ICD-10-CM | POA: Diagnosis not present

## 2021-11-20 DIAGNOSIS — R278 Other lack of coordination: Secondary | ICD-10-CM | POA: Diagnosis not present

## 2021-11-20 DIAGNOSIS — R296 Repeated falls: Secondary | ICD-10-CM | POA: Diagnosis not present

## 2021-11-20 DIAGNOSIS — R2689 Other abnormalities of gait and mobility: Secondary | ICD-10-CM | POA: Diagnosis not present

## 2021-11-20 DIAGNOSIS — M6281 Muscle weakness (generalized): Secondary | ICD-10-CM | POA: Diagnosis not present

## 2021-11-21 DIAGNOSIS — R2689 Other abnormalities of gait and mobility: Secondary | ICD-10-CM | POA: Diagnosis not present

## 2021-11-21 DIAGNOSIS — R1310 Dysphagia, unspecified: Secondary | ICD-10-CM | POA: Diagnosis not present

## 2021-11-21 DIAGNOSIS — R1312 Dysphagia, oropharyngeal phase: Secondary | ICD-10-CM | POA: Diagnosis not present

## 2021-11-21 DIAGNOSIS — R278 Other lack of coordination: Secondary | ICD-10-CM | POA: Diagnosis not present

## 2021-11-21 DIAGNOSIS — R296 Repeated falls: Secondary | ICD-10-CM | POA: Diagnosis not present

## 2021-11-21 DIAGNOSIS — M6281 Muscle weakness (generalized): Secondary | ICD-10-CM | POA: Diagnosis not present

## 2021-11-22 DIAGNOSIS — R1312 Dysphagia, oropharyngeal phase: Secondary | ICD-10-CM | POA: Diagnosis not present

## 2021-11-22 DIAGNOSIS — R2689 Other abnormalities of gait and mobility: Secondary | ICD-10-CM | POA: Diagnosis not present

## 2021-11-22 DIAGNOSIS — R296 Repeated falls: Secondary | ICD-10-CM | POA: Diagnosis not present

## 2021-11-22 DIAGNOSIS — M6281 Muscle weakness (generalized): Secondary | ICD-10-CM | POA: Diagnosis not present

## 2021-11-22 DIAGNOSIS — R278 Other lack of coordination: Secondary | ICD-10-CM | POA: Diagnosis not present

## 2021-11-22 DIAGNOSIS — R1310 Dysphagia, unspecified: Secondary | ICD-10-CM | POA: Diagnosis not present

## 2021-11-23 DIAGNOSIS — M6281 Muscle weakness (generalized): Secondary | ICD-10-CM | POA: Diagnosis not present

## 2021-11-23 DIAGNOSIS — R278 Other lack of coordination: Secondary | ICD-10-CM | POA: Diagnosis not present

## 2021-11-23 DIAGNOSIS — R1312 Dysphagia, oropharyngeal phase: Secondary | ICD-10-CM | POA: Diagnosis not present

## 2021-11-23 DIAGNOSIS — R1310 Dysphagia, unspecified: Secondary | ICD-10-CM | POA: Diagnosis not present

## 2021-11-23 DIAGNOSIS — R296 Repeated falls: Secondary | ICD-10-CM | POA: Diagnosis not present

## 2021-11-24 DIAGNOSIS — R296 Repeated falls: Secondary | ICD-10-CM | POA: Diagnosis not present

## 2021-11-24 DIAGNOSIS — R1310 Dysphagia, unspecified: Secondary | ICD-10-CM | POA: Diagnosis not present

## 2021-11-24 DIAGNOSIS — R1312 Dysphagia, oropharyngeal phase: Secondary | ICD-10-CM | POA: Diagnosis not present

## 2021-11-24 DIAGNOSIS — M6281 Muscle weakness (generalized): Secondary | ICD-10-CM | POA: Diagnosis not present

## 2021-11-24 DIAGNOSIS — R278 Other lack of coordination: Secondary | ICD-10-CM | POA: Diagnosis not present

## 2021-11-27 DIAGNOSIS — R1310 Dysphagia, unspecified: Secondary | ICD-10-CM | POA: Diagnosis not present

## 2021-11-27 DIAGNOSIS — R296 Repeated falls: Secondary | ICD-10-CM | POA: Diagnosis not present

## 2021-11-27 DIAGNOSIS — M6281 Muscle weakness (generalized): Secondary | ICD-10-CM | POA: Diagnosis not present

## 2021-11-27 DIAGNOSIS — R1312 Dysphagia, oropharyngeal phase: Secondary | ICD-10-CM | POA: Diagnosis not present

## 2021-11-27 DIAGNOSIS — R278 Other lack of coordination: Secondary | ICD-10-CM | POA: Diagnosis not present

## 2021-11-27 DIAGNOSIS — R2689 Other abnormalities of gait and mobility: Secondary | ICD-10-CM | POA: Diagnosis not present

## 2021-11-28 DIAGNOSIS — R1312 Dysphagia, oropharyngeal phase: Secondary | ICD-10-CM | POA: Diagnosis not present

## 2021-11-28 DIAGNOSIS — M6281 Muscle weakness (generalized): Secondary | ICD-10-CM | POA: Diagnosis not present

## 2021-11-28 DIAGNOSIS — R278 Other lack of coordination: Secondary | ICD-10-CM | POA: Diagnosis not present

## 2021-11-28 DIAGNOSIS — R2689 Other abnormalities of gait and mobility: Secondary | ICD-10-CM | POA: Diagnosis not present

## 2021-11-28 DIAGNOSIS — R296 Repeated falls: Secondary | ICD-10-CM | POA: Diagnosis not present

## 2021-11-28 DIAGNOSIS — R1310 Dysphagia, unspecified: Secondary | ICD-10-CM | POA: Diagnosis not present

## 2021-11-29 DIAGNOSIS — R1310 Dysphagia, unspecified: Secondary | ICD-10-CM | POA: Diagnosis not present

## 2021-11-29 DIAGNOSIS — R1312 Dysphagia, oropharyngeal phase: Secondary | ICD-10-CM | POA: Diagnosis not present

## 2021-11-29 DIAGNOSIS — M6281 Muscle weakness (generalized): Secondary | ICD-10-CM | POA: Diagnosis not present

## 2021-11-29 DIAGNOSIS — R278 Other lack of coordination: Secondary | ICD-10-CM | POA: Diagnosis not present

## 2021-11-29 DIAGNOSIS — R296 Repeated falls: Secondary | ICD-10-CM | POA: Diagnosis not present

## 2021-11-30 DIAGNOSIS — R278 Other lack of coordination: Secondary | ICD-10-CM | POA: Diagnosis not present

## 2021-11-30 DIAGNOSIS — R1312 Dysphagia, oropharyngeal phase: Secondary | ICD-10-CM | POA: Diagnosis not present

## 2021-11-30 DIAGNOSIS — R2689 Other abnormalities of gait and mobility: Secondary | ICD-10-CM | POA: Diagnosis not present

## 2021-11-30 DIAGNOSIS — R1310 Dysphagia, unspecified: Secondary | ICD-10-CM | POA: Diagnosis not present

## 2021-11-30 DIAGNOSIS — R296 Repeated falls: Secondary | ICD-10-CM | POA: Diagnosis not present

## 2021-11-30 DIAGNOSIS — M6281 Muscle weakness (generalized): Secondary | ICD-10-CM | POA: Diagnosis not present

## 2021-12-01 DIAGNOSIS — R278 Other lack of coordination: Secondary | ICD-10-CM | POA: Diagnosis not present

## 2021-12-01 DIAGNOSIS — R296 Repeated falls: Secondary | ICD-10-CM | POA: Diagnosis not present

## 2021-12-01 DIAGNOSIS — M6281 Muscle weakness (generalized): Secondary | ICD-10-CM | POA: Diagnosis not present

## 2021-12-05 DIAGNOSIS — R1312 Dysphagia, oropharyngeal phase: Secondary | ICD-10-CM | POA: Diagnosis not present

## 2021-12-05 DIAGNOSIS — R1310 Dysphagia, unspecified: Secondary | ICD-10-CM | POA: Diagnosis not present

## 2021-12-06 DIAGNOSIS — R1310 Dysphagia, unspecified: Secondary | ICD-10-CM | POA: Diagnosis not present

## 2021-12-06 DIAGNOSIS — R2689 Other abnormalities of gait and mobility: Secondary | ICD-10-CM | POA: Diagnosis not present

## 2021-12-06 DIAGNOSIS — R278 Other lack of coordination: Secondary | ICD-10-CM | POA: Diagnosis not present

## 2021-12-06 DIAGNOSIS — R296 Repeated falls: Secondary | ICD-10-CM | POA: Diagnosis not present

## 2021-12-06 DIAGNOSIS — R1312 Dysphagia, oropharyngeal phase: Secondary | ICD-10-CM | POA: Diagnosis not present

## 2021-12-06 DIAGNOSIS — M6281 Muscle weakness (generalized): Secondary | ICD-10-CM | POA: Diagnosis not present

## 2021-12-07 DIAGNOSIS — R1310 Dysphagia, unspecified: Secondary | ICD-10-CM | POA: Diagnosis not present

## 2021-12-07 DIAGNOSIS — R296 Repeated falls: Secondary | ICD-10-CM | POA: Diagnosis not present

## 2021-12-07 DIAGNOSIS — R2689 Other abnormalities of gait and mobility: Secondary | ICD-10-CM | POA: Diagnosis not present

## 2021-12-07 DIAGNOSIS — R1312 Dysphagia, oropharyngeal phase: Secondary | ICD-10-CM | POA: Diagnosis not present

## 2021-12-08 DIAGNOSIS — R278 Other lack of coordination: Secondary | ICD-10-CM | POA: Diagnosis not present

## 2021-12-08 DIAGNOSIS — R2689 Other abnormalities of gait and mobility: Secondary | ICD-10-CM | POA: Diagnosis not present

## 2021-12-08 DIAGNOSIS — R296 Repeated falls: Secondary | ICD-10-CM | POA: Diagnosis not present

## 2021-12-08 DIAGNOSIS — M6281 Muscle weakness (generalized): Secondary | ICD-10-CM | POA: Diagnosis not present

## 2021-12-11 DIAGNOSIS — R1310 Dysphagia, unspecified: Secondary | ICD-10-CM | POA: Diagnosis not present

## 2021-12-11 DIAGNOSIS — R1312 Dysphagia, oropharyngeal phase: Secondary | ICD-10-CM | POA: Diagnosis not present

## 2021-12-12 DIAGNOSIS — R296 Repeated falls: Secondary | ICD-10-CM | POA: Diagnosis not present

## 2021-12-12 DIAGNOSIS — R2689 Other abnormalities of gait and mobility: Secondary | ICD-10-CM | POA: Diagnosis not present

## 2021-12-13 DIAGNOSIS — M6281 Muscle weakness (generalized): Secondary | ICD-10-CM | POA: Diagnosis not present

## 2021-12-13 DIAGNOSIS — R278 Other lack of coordination: Secondary | ICD-10-CM | POA: Diagnosis not present

## 2021-12-13 DIAGNOSIS — R1312 Dysphagia, oropharyngeal phase: Secondary | ICD-10-CM | POA: Diagnosis not present

## 2021-12-13 DIAGNOSIS — R1310 Dysphagia, unspecified: Secondary | ICD-10-CM | POA: Diagnosis not present

## 2021-12-13 DIAGNOSIS — R296 Repeated falls: Secondary | ICD-10-CM | POA: Diagnosis not present

## 2021-12-14 DIAGNOSIS — R1312 Dysphagia, oropharyngeal phase: Secondary | ICD-10-CM | POA: Diagnosis not present

## 2021-12-14 DIAGNOSIS — M6281 Muscle weakness (generalized): Secondary | ICD-10-CM | POA: Diagnosis not present

## 2021-12-14 DIAGNOSIS — R278 Other lack of coordination: Secondary | ICD-10-CM | POA: Diagnosis not present

## 2021-12-14 DIAGNOSIS — R296 Repeated falls: Secondary | ICD-10-CM | POA: Diagnosis not present

## 2021-12-14 DIAGNOSIS — R2689 Other abnormalities of gait and mobility: Secondary | ICD-10-CM | POA: Diagnosis not present

## 2021-12-14 DIAGNOSIS — R1310 Dysphagia, unspecified: Secondary | ICD-10-CM | POA: Diagnosis not present

## 2021-12-15 DIAGNOSIS — M6281 Muscle weakness (generalized): Secondary | ICD-10-CM | POA: Diagnosis not present

## 2021-12-15 DIAGNOSIS — R296 Repeated falls: Secondary | ICD-10-CM | POA: Diagnosis not present

## 2021-12-15 DIAGNOSIS — R2689 Other abnormalities of gait and mobility: Secondary | ICD-10-CM | POA: Diagnosis not present

## 2021-12-15 DIAGNOSIS — R1310 Dysphagia, unspecified: Secondary | ICD-10-CM | POA: Diagnosis not present

## 2021-12-15 DIAGNOSIS — R1312 Dysphagia, oropharyngeal phase: Secondary | ICD-10-CM | POA: Diagnosis not present

## 2021-12-15 DIAGNOSIS — R278 Other lack of coordination: Secondary | ICD-10-CM | POA: Diagnosis not present

## 2021-12-18 DIAGNOSIS — R1310 Dysphagia, unspecified: Secondary | ICD-10-CM | POA: Diagnosis not present

## 2021-12-18 DIAGNOSIS — R1312 Dysphagia, oropharyngeal phase: Secondary | ICD-10-CM | POA: Diagnosis not present

## 2021-12-19 DIAGNOSIS — R296 Repeated falls: Secondary | ICD-10-CM | POA: Diagnosis not present

## 2021-12-19 DIAGNOSIS — M6281 Muscle weakness (generalized): Secondary | ICD-10-CM | POA: Diagnosis not present

## 2021-12-19 DIAGNOSIS — R1310 Dysphagia, unspecified: Secondary | ICD-10-CM | POA: Diagnosis not present

## 2021-12-19 DIAGNOSIS — R1312 Dysphagia, oropharyngeal phase: Secondary | ICD-10-CM | POA: Diagnosis not present

## 2021-12-19 DIAGNOSIS — R278 Other lack of coordination: Secondary | ICD-10-CM | POA: Diagnosis not present

## 2021-12-20 DIAGNOSIS — R1310 Dysphagia, unspecified: Secondary | ICD-10-CM | POA: Diagnosis not present

## 2021-12-20 DIAGNOSIS — M6281 Muscle weakness (generalized): Secondary | ICD-10-CM | POA: Diagnosis not present

## 2021-12-20 DIAGNOSIS — R296 Repeated falls: Secondary | ICD-10-CM | POA: Diagnosis not present

## 2021-12-20 DIAGNOSIS — R278 Other lack of coordination: Secondary | ICD-10-CM | POA: Diagnosis not present

## 2021-12-20 DIAGNOSIS — R1312 Dysphagia, oropharyngeal phase: Secondary | ICD-10-CM | POA: Diagnosis not present

## 2021-12-20 DIAGNOSIS — R2689 Other abnormalities of gait and mobility: Secondary | ICD-10-CM | POA: Diagnosis not present

## 2021-12-22 ENCOUNTER — Other Ambulatory Visit: Payer: Self-pay | Admitting: Family Medicine

## 2021-12-22 DIAGNOSIS — R1310 Dysphagia, unspecified: Secondary | ICD-10-CM | POA: Diagnosis not present

## 2021-12-22 DIAGNOSIS — R1312 Dysphagia, oropharyngeal phase: Secondary | ICD-10-CM | POA: Diagnosis not present

## 2021-12-22 DIAGNOSIS — M6281 Muscle weakness (generalized): Secondary | ICD-10-CM | POA: Diagnosis not present

## 2021-12-22 DIAGNOSIS — R278 Other lack of coordination: Secondary | ICD-10-CM | POA: Diagnosis not present

## 2021-12-22 DIAGNOSIS — R296 Repeated falls: Secondary | ICD-10-CM | POA: Diagnosis not present

## 2021-12-22 DIAGNOSIS — Z1231 Encounter for screening mammogram for malignant neoplasm of breast: Secondary | ICD-10-CM

## 2021-12-25 DIAGNOSIS — R296 Repeated falls: Secondary | ICD-10-CM | POA: Diagnosis not present

## 2021-12-25 DIAGNOSIS — R1310 Dysphagia, unspecified: Secondary | ICD-10-CM | POA: Diagnosis not present

## 2021-12-25 DIAGNOSIS — R2689 Other abnormalities of gait and mobility: Secondary | ICD-10-CM | POA: Diagnosis not present

## 2021-12-25 DIAGNOSIS — R1312 Dysphagia, oropharyngeal phase: Secondary | ICD-10-CM | POA: Diagnosis not present

## 2021-12-26 DIAGNOSIS — R1310 Dysphagia, unspecified: Secondary | ICD-10-CM | POA: Diagnosis not present

## 2021-12-26 DIAGNOSIS — R296 Repeated falls: Secondary | ICD-10-CM | POA: Diagnosis not present

## 2021-12-26 DIAGNOSIS — R278 Other lack of coordination: Secondary | ICD-10-CM | POA: Diagnosis not present

## 2021-12-26 DIAGNOSIS — R1312 Dysphagia, oropharyngeal phase: Secondary | ICD-10-CM | POA: Diagnosis not present

## 2021-12-26 DIAGNOSIS — M6281 Muscle weakness (generalized): Secondary | ICD-10-CM | POA: Diagnosis not present

## 2021-12-27 DIAGNOSIS — R278 Other lack of coordination: Secondary | ICD-10-CM | POA: Diagnosis not present

## 2021-12-27 DIAGNOSIS — R296 Repeated falls: Secondary | ICD-10-CM | POA: Diagnosis not present

## 2021-12-27 DIAGNOSIS — R1312 Dysphagia, oropharyngeal phase: Secondary | ICD-10-CM | POA: Diagnosis not present

## 2021-12-27 DIAGNOSIS — R2689 Other abnormalities of gait and mobility: Secondary | ICD-10-CM | POA: Diagnosis not present

## 2021-12-27 DIAGNOSIS — M6281 Muscle weakness (generalized): Secondary | ICD-10-CM | POA: Diagnosis not present

## 2021-12-27 DIAGNOSIS — R1310 Dysphagia, unspecified: Secondary | ICD-10-CM | POA: Diagnosis not present

## 2021-12-28 DIAGNOSIS — M810 Age-related osteoporosis without current pathological fracture: Secondary | ICD-10-CM | POA: Diagnosis not present

## 2021-12-28 DIAGNOSIS — M6281 Muscle weakness (generalized): Secondary | ICD-10-CM | POA: Diagnosis not present

## 2021-12-28 DIAGNOSIS — G309 Alzheimer's disease, unspecified: Secondary | ICD-10-CM | POA: Diagnosis not present

## 2021-12-28 DIAGNOSIS — I10 Essential (primary) hypertension: Secondary | ICD-10-CM | POA: Diagnosis not present

## 2021-12-28 DIAGNOSIS — R296 Repeated falls: Secondary | ICD-10-CM | POA: Diagnosis not present

## 2021-12-28 DIAGNOSIS — E1140 Type 2 diabetes mellitus with diabetic neuropathy, unspecified: Secondary | ICD-10-CM | POA: Diagnosis not present

## 2021-12-28 DIAGNOSIS — E7800 Pure hypercholesterolemia, unspecified: Secondary | ICD-10-CM | POA: Diagnosis not present

## 2021-12-28 DIAGNOSIS — R278 Other lack of coordination: Secondary | ICD-10-CM | POA: Diagnosis not present

## 2021-12-29 DIAGNOSIS — R1310 Dysphagia, unspecified: Secondary | ICD-10-CM | POA: Diagnosis not present

## 2021-12-29 DIAGNOSIS — R1312 Dysphagia, oropharyngeal phase: Secondary | ICD-10-CM | POA: Diagnosis not present

## 2021-12-29 DIAGNOSIS — R296 Repeated falls: Secondary | ICD-10-CM | POA: Diagnosis not present

## 2021-12-29 DIAGNOSIS — R2689 Other abnormalities of gait and mobility: Secondary | ICD-10-CM | POA: Diagnosis not present

## 2022-01-01 DIAGNOSIS — M6281 Muscle weakness (generalized): Secondary | ICD-10-CM | POA: Diagnosis not present

## 2022-01-01 DIAGNOSIS — R1310 Dysphagia, unspecified: Secondary | ICD-10-CM | POA: Diagnosis not present

## 2022-01-01 DIAGNOSIS — R2689 Other abnormalities of gait and mobility: Secondary | ICD-10-CM | POA: Diagnosis not present

## 2022-01-01 DIAGNOSIS — R296 Repeated falls: Secondary | ICD-10-CM | POA: Diagnosis not present

## 2022-01-01 DIAGNOSIS — R278 Other lack of coordination: Secondary | ICD-10-CM | POA: Diagnosis not present

## 2022-01-01 DIAGNOSIS — R1312 Dysphagia, oropharyngeal phase: Secondary | ICD-10-CM | POA: Diagnosis not present

## 2022-01-02 DIAGNOSIS — R296 Repeated falls: Secondary | ICD-10-CM | POA: Diagnosis not present

## 2022-01-02 DIAGNOSIS — R2689 Other abnormalities of gait and mobility: Secondary | ICD-10-CM | POA: Diagnosis not present

## 2022-01-03 DIAGNOSIS — R278 Other lack of coordination: Secondary | ICD-10-CM | POA: Diagnosis not present

## 2022-01-03 DIAGNOSIS — R296 Repeated falls: Secondary | ICD-10-CM | POA: Diagnosis not present

## 2022-01-03 DIAGNOSIS — M6281 Muscle weakness (generalized): Secondary | ICD-10-CM | POA: Diagnosis not present

## 2022-01-04 DIAGNOSIS — R1312 Dysphagia, oropharyngeal phase: Secondary | ICD-10-CM | POA: Diagnosis not present

## 2022-01-04 DIAGNOSIS — R1310 Dysphagia, unspecified: Secondary | ICD-10-CM | POA: Diagnosis not present

## 2022-01-04 DIAGNOSIS — R2689 Other abnormalities of gait and mobility: Secondary | ICD-10-CM | POA: Diagnosis not present

## 2022-01-04 DIAGNOSIS — R296 Repeated falls: Secondary | ICD-10-CM | POA: Diagnosis not present

## 2022-01-05 DIAGNOSIS — R1310 Dysphagia, unspecified: Secondary | ICD-10-CM | POA: Diagnosis not present

## 2022-01-05 DIAGNOSIS — R1312 Dysphagia, oropharyngeal phase: Secondary | ICD-10-CM | POA: Diagnosis not present

## 2022-01-05 DIAGNOSIS — R278 Other lack of coordination: Secondary | ICD-10-CM | POA: Diagnosis not present

## 2022-01-05 DIAGNOSIS — R296 Repeated falls: Secondary | ICD-10-CM | POA: Diagnosis not present

## 2022-01-05 DIAGNOSIS — M6281 Muscle weakness (generalized): Secondary | ICD-10-CM | POA: Diagnosis not present

## 2022-01-08 DIAGNOSIS — R1312 Dysphagia, oropharyngeal phase: Secondary | ICD-10-CM | POA: Diagnosis not present

## 2022-01-08 DIAGNOSIS — R1310 Dysphagia, unspecified: Secondary | ICD-10-CM | POA: Diagnosis not present

## 2022-01-09 ENCOUNTER — Emergency Department (HOSPITAL_COMMUNITY): Payer: Medicare Other

## 2022-01-09 ENCOUNTER — Emergency Department (HOSPITAL_COMMUNITY)
Admission: EM | Admit: 2022-01-09 | Discharge: 2022-01-10 | Disposition: A | Discharge status: Skilled Nursing Facility | Payer: Medicare Other | Attending: Emergency Medicine | Admitting: Emergency Medicine

## 2022-01-09 ENCOUNTER — Encounter (HOSPITAL_COMMUNITY): Payer: Self-pay

## 2022-01-09 ENCOUNTER — Emergency Department (HOSPITAL_BASED_OUTPATIENT_CLINIC_OR_DEPARTMENT_OTHER)
Admit: 2022-01-09 | Discharge: 2022-01-09 | Disposition: A | Discharge status: Home / Self Care | Payer: Medicare Other | Attending: Emergency Medicine | Admitting: Emergency Medicine

## 2022-01-09 DIAGNOSIS — Z794 Long term (current) use of insulin: Secondary | ICD-10-CM | POA: Insufficient documentation

## 2022-01-09 DIAGNOSIS — Z741 Need for assistance with personal care: Secondary | ICD-10-CM | POA: Insufficient documentation

## 2022-01-09 DIAGNOSIS — I10 Essential (primary) hypertension: Secondary | ICD-10-CM | POA: Diagnosis not present

## 2022-01-09 DIAGNOSIS — R600 Localized edema: Secondary | ICD-10-CM

## 2022-01-09 DIAGNOSIS — H6123 Impacted cerumen, bilateral: Secondary | ICD-10-CM | POA: Diagnosis not present

## 2022-01-09 DIAGNOSIS — R52 Pain, unspecified: Secondary | ICD-10-CM | POA: Diagnosis not present

## 2022-01-09 DIAGNOSIS — S0990XA Unspecified injury of head, initial encounter: Secondary | ICD-10-CM | POA: Diagnosis not present

## 2022-01-09 DIAGNOSIS — Z7984 Long term (current) use of oral hypoglycemic drugs: Secondary | ICD-10-CM | POA: Insufficient documentation

## 2022-01-09 DIAGNOSIS — Z7982 Long term (current) use of aspirin: Secondary | ICD-10-CM | POA: Insufficient documentation

## 2022-01-09 DIAGNOSIS — Z96651 Presence of right artificial knee joint: Secondary | ICD-10-CM | POA: Insufficient documentation

## 2022-01-09 DIAGNOSIS — Z20822 Contact with and (suspected) exposure to covid-19: Secondary | ICD-10-CM | POA: Insufficient documentation

## 2022-01-09 DIAGNOSIS — F0390 Unspecified dementia without behavioral disturbance: Secondary | ICD-10-CM | POA: Diagnosis not present

## 2022-01-09 DIAGNOSIS — M545 Low back pain, unspecified: Secondary | ICD-10-CM | POA: Diagnosis not present

## 2022-01-09 DIAGNOSIS — E1140 Type 2 diabetes mellitus with diabetic neuropathy, unspecified: Secondary | ICD-10-CM | POA: Insufficient documentation

## 2022-01-09 DIAGNOSIS — Z789 Other specified health status: Secondary | ICD-10-CM

## 2022-01-09 DIAGNOSIS — I6782 Cerebral ischemia: Secondary | ICD-10-CM | POA: Diagnosis not present

## 2022-01-09 DIAGNOSIS — Z79899 Other long term (current) drug therapy: Secondary | ICD-10-CM | POA: Diagnosis not present

## 2022-01-09 DIAGNOSIS — Z8616 Personal history of COVID-19: Secondary | ICD-10-CM | POA: Diagnosis not present

## 2022-01-09 LAB — COMPREHENSIVE METABOLIC PANEL
ALT: 34 U/L (ref 0–44)
AST: 40 U/L (ref 15–41)
Albumin: 4.1 g/dL (ref 3.5–5.0)
Alkaline Phosphatase: 55 U/L (ref 38–126)
Anion gap: 10 (ref 5–15)
BUN: 15 mg/dL (ref 8–23)
CO2: 20 mmol/L — ABNORMAL LOW (ref 22–32)
Calcium: 9.8 mg/dL (ref 8.9–10.3)
Chloride: 108 mmol/L (ref 98–111)
Creatinine, Ser: 0.84 mg/dL (ref 0.44–1.00)
GFR, Estimated: >60 mL/min (ref >60)
Glucose, Bld: 176 mg/dL — ABNORMAL HIGH (ref 70–99)
Potassium: 4.1 mmol/L (ref 3.5–5.1)
Sodium: 138 mmol/L (ref 135–145)
Total Bilirubin: 0.7 mg/dL (ref 0.3–1.2)
Total Protein: 6.8 g/dL (ref 6.5–8.1)

## 2022-01-09 LAB — URINALYSIS, ROUTINE W REFLEX MICROSCOPIC
Bilirubin Urine: NEGATIVE
Glucose, UA: NEGATIVE mg/dL
Ketones, ur: NEGATIVE mg/dL
Leukocytes,Ua: NEGATIVE
Nitrite: NEGATIVE
Protein, ur: NEGATIVE mg/dL
Specific Gravity, Urine: 1.005 (ref 1.005–1.030)
pH: 6 (ref 5.0–8.0)

## 2022-01-09 LAB — CBC WITH DIFFERENTIAL/PLATELET
Abs Immature Granulocytes: 0.02 10*3/uL (ref 0.00–0.07)
Basophils Absolute: 0 10*3/uL (ref 0.0–0.1)
Basophils Relative: 1 %
Eosinophils Absolute: 0.2 10*3/uL (ref 0.0–0.5)
Eosinophils Relative: 3 %
HCT: 37.7 % (ref 36.0–46.0)
Hemoglobin: 12.7 g/dL (ref 12.0–15.0)
Immature Granulocytes: 0 %
Lymphocytes Relative: 27 %
Lymphs Abs: 2 10*3/uL (ref 0.7–4.0)
MCH: 30.8 pg (ref 26.0–34.0)
MCHC: 33.7 g/dL (ref 30.0–36.0)
MCV: 91.3 fL (ref 80.0–100.0)
Monocytes Absolute: 0.5 10*3/uL (ref 0.1–1.0)
Monocytes Relative: 7 %
Neutro Abs: 4.5 10*3/uL (ref 1.7–7.7)
Neutrophils Relative %: 62 %
Platelets: 144 10*3/uL — ABNORMAL LOW (ref 150–400)
RBC: 4.13 MIL/uL (ref 3.87–5.11)
RDW: 13 % (ref 11.5–15.5)
WBC: 7.2 10*3/uL (ref 4.0–10.5)
nRBC: 0 % (ref 0.0–0.2)

## 2022-01-09 LAB — RESP PANEL BY RT-PCR (FLU A&B, COVID) ARPGX2
Influenza A by PCR: NEGATIVE
Influenza B by PCR: NEGATIVE
SARS Coronavirus 2 by RT PCR: NEGATIVE

## 2022-01-09 LAB — CBG MONITORING, ED: Glucose-Capillary: 168 mg/dL — ABNORMAL HIGH (ref 70–99)

## 2022-01-09 MED ORDER — TIMOLOL MALEATE 0.5 % OP SOLN
1.0000 [drp] | Freq: Two times a day (BID) | OPHTHALMIC | Status: DC
  Administered 2022-01-09 – 2022-01-10 (×3): 1 [drp] via OPHTHALMIC
  Filled 2022-01-09: qty 5

## 2022-01-09 MED ORDER — FUROSEMIDE 40 MG PO TABS
20.0000 mg | ORAL_TABLET | Freq: Every day | ORAL | Status: DC
  Administered 2022-01-09 – 2022-01-10 (×2): 20 mg via ORAL
  Filled 2022-01-09 (×2): qty 1

## 2022-01-09 MED ORDER — TIMOLOL HEMIHYDRATE 0.5 % OP SOLN: 1.0000 [drp] | Freq: Two times a day (BID) | OPHTHALMIC | Status: DC

## 2022-01-09 MED ORDER — LINAGLIPTIN 5 MG PO TABS
5.0000 mg | ORAL_TABLET | Freq: Every day | ORAL | Status: DC
  Administered 2022-01-09 – 2022-01-10 (×2): 5 mg via ORAL
  Filled 2022-01-09 (×2): qty 1

## 2022-01-09 MED ORDER — FAMOTIDINE 20 MG PO TABS: 20.0000 mg | ORAL_TABLET | Freq: Every day | ORAL | Status: DC | PRN

## 2022-01-09 MED ORDER — PILOCARPINE HCL 1 % OP SOLN
1.0000 [drp] | Freq: Four times a day (QID) | OPHTHALMIC | Status: DC
  Administered 2022-01-09 – 2022-01-10 (×4): 1 [drp] via OPHTHALMIC
  Filled 2022-01-09: qty 15

## 2022-01-09 MED ORDER — RIVASTIGMINE TARTRATE 3 MG PO CAPS
3.0000 mg | ORAL_CAPSULE | Freq: Two times a day (BID) | ORAL | Status: DC
  Administered 2022-01-09 – 2022-01-10 (×3): 3 mg via ORAL
  Filled 2022-01-09 (×3): qty 1

## 2022-01-09 MED ORDER — LATANOPROST 0.005 % OP SOLN
1.0000 [drp] | Freq: Every day | OPHTHALMIC | Status: DC
  Administered 2022-01-09: 1 [drp] via OPHTHALMIC
  Filled 2022-01-09: qty 2.5

## 2022-01-09 MED ORDER — ENALAPRIL MALEATE 10 MG PO TABS
10.0000 mg | ORAL_TABLET | Freq: Every day | ORAL | Status: DC
  Administered 2022-01-09 – 2022-01-10 (×2): 10 mg via ORAL
  Filled 2022-01-09 (×2): qty 1

## 2022-01-09 MED ORDER — METFORMIN HCL ER 500 MG PO TB24
1000.0000 mg | ORAL_TABLET | Freq: Every day | ORAL | Status: DC
  Administered 2022-01-09: 1000 mg via ORAL
  Filled 2022-01-09: qty 2

## 2022-01-09 MED ORDER — ATORVASTATIN CALCIUM 40 MG PO TABS
40.0000 mg | ORAL_TABLET | Freq: Every evening | ORAL | Status: DC
  Administered 2022-01-09: 40 mg via ORAL
  Filled 2022-01-09: qty 1

## 2022-01-09 MED ORDER — ASPIRIN 81 MG PO CHEW
81.0000 mg | CHEWABLE_TABLET | Freq: Every day | ORAL | Status: DC
  Administered 2022-01-09 – 2022-01-10 (×2): 81 mg via ORAL
  Filled 2022-01-09 (×2): qty 1

## 2022-01-09 MED ORDER — MIRABEGRON ER 25 MG PO TB24: 50.0000 mg | ORAL_TABLET | Freq: Every day | ORAL | Status: DC

## 2022-01-09 NOTE — Progress Notes (Signed)
Bilateral lower extremity venous duplex has been completed. Preliminary results can be found in CV Proc through chart review.  Results were given to Dr. Langston Masker.  01/09/22 10:55 AM Lindsey Baldwin RVT

## 2022-01-09 NOTE — ED Notes (Signed)
Visitor for pt Lindsey Baldwin let staff know she is leaving for the evening, she has asked if there are any updates could we please call her and let her know! Her information is in the chart but her number is [phone]

## 2022-01-09 NOTE — Social Work (Signed)
CSW spoke to [facility] while down in the ED. POA wants to take the friend to [facility] tomorrow when the Pt is DC. TOC will update when the Pt can be DC to [facility]

## 2022-01-09 NOTE — Evaluation (Addendum)
Physical Therapy Evaluation Patient Details Name: Lindsey Baldwin MRN: [id number] DOB: [birth year] Today's Date: 01/09/2022  History of Present Illness  76 yo  female admitted 01/09/22 for fall, HA,  and inability to care for self. PMH: dementiaDM,OSA, HTN,gluacoma L eye prosthesis, RTKA,  Clinical Impression  The patient was admitted for above problems.  Patient's friend, Seth Bake present, and provided information, patient able to follow directions, ambulated with RW x 60'. Ambulated x 5' with cane with loss of balance, steady assistance to support.    Patient has been in independent living and now requires higher level of  care. Patient has  h/o falls and is noted with decreased balance.  Pt admitted with above diagnosis.  Pt currently with functional limitations due to the deficits listed below (see PT Problem List). Pt will benefit from skilled PT to increase their independence and safety with mobility to allow discharge to the venue listed below.        Recommendations for follow up therapy are one component of a multi-disciplinary discharge planning process, led by the attending physician.  Recommendations may be updated based on patient status, additional functional criteria and insurance authorization.  Follow Up Recommendations Skilled nursing-short term rehab (<3 hours/day) Can patient physically be transported by private vehicle: Yes    Assistance Recommended at Discharge Intermittent Supervision/Assistance  Patient can return home with the following  A little help with walking and/or transfers;A little help with bathing/dressing/bathroom;Assistance with cooking/housework;Assist for transportation;Help with stairs or ramp for entrance    Equipment Recommendations None recommended by PT  Recommendations for Other Services       Functional Status Assessment Patient has had a recent decline in their functional status and demonstrates the ability to make significant improvements  in function in a reasonable and predictable amount of time.     Precautions / Restrictions Precautions Precautions: Fall   LEGALLY BLIND, LEFT EYE PROSTHESIS     Mobility  Bed Mobility Overal bed mobility: Needs Assistance Bed Mobility: Supine to Sit, Sit to Supine     Supine to sit: Supervision Sit to supine: Supervision        Transfers Overall transfer level: Needs assistance Equipment used: Rolling walker (2 wheels) Transfers: Sit to/from Stand Sit to Stand: Min guard                Ambulation/Gait Ambulation/Gait assistance: Min guard, Min assist Gait Distance (Feet): 60 Feet Assistive device: Rolling walker (2 wheels) Gait Pattern/deviations: Step-through pattern, Shuffle Gait velocity: decr     General Gait Details: balance  fair with Rw, loss of balance when ambulted with cane so returned RW o for ambulation.  Stairs            Wheelchair Mobility    Modified Rankin (Stroke Patients Only)       Balance Overall balance assessment: Needs assistance, History of Falls Sitting-balance support: No upper extremity supported, Feet supported Sitting balance-Leahy Scale: Good     Standing balance support: Single extremity supported, During functional activity, Reliant on assistive device for balance Standing balance-Leahy Scale: Poor                               Pertinent Vitals/Pain Pain Assessment Pain Location: low back Pain Intervention(s): Monitored during session    Home Living Family/patient expects to be discharged to:: Skilled nursing facility Living Arrangements: Alone   Type of Home: Independent living facility Home Access: Level entry  Home Layout: One level        Prior Function Prior Level of Function : Needs assist       Physical Assist : Mobility (physical)     Mobility Comments: used rollator or cane       Hand Dominance   Dominant Hand: Right    Extremity/Trunk Assessment         Lower Extremity Assessment Lower Extremity Assessment: Generalized weakness    Cervical / Trunk Assessment Cervical / Trunk Assessment: Normal  Communication   Communication: No difficulties  Cognition Arousal/Alertness: Awake/alert Behavior During Therapy: WFL for tasks assessed/performed Overall Cognitive Status: History of cognitive impairments - at baseline                                 General Comments: oriented to place, follow directions        General Comments      Exercises     Assessment/Plan    PT Assessment Patient needs continued PT services  PT Problem List         PT Treatment Interventions DME instruction;Therapeutic activities;Cognitive remediation;Balance training;Gait training;Functional mobility training;Patient/family education    PT Goals (Current goals can be found in the Care Plan section)  Acute Rehab PT Goals Patient Stated Goal: pt did not state PT Goal Formulation: All assessment and education complete, DC therapy Time For Goal Achievement: 01/23/22 Potential to Achieve Goals: Fair    Frequency Min 2X/week     Co-evaluation               AM-PAC PT "6 Clicks" Mobility  Outcome Measure Help needed turning from your back to your side while in a flat bed without using bedrails?: None Help needed moving from lying on your back to sitting on the side of a flat bed without using bedrails?: None Help needed moving to and from a bed to a chair (including a wheelchair)?: A Little Help needed standing up from a chair using your arms (e.g., wheelchair or bedside chair)?: A Little Help needed to walk in hospital room?: A Little Help needed climbing 3-5 steps with a railing? : A Lot 6 Click Score: 19    End of Session   Activity Tolerance: Patient tolerated treatment well Patient left: in bed;with call bell/phone within reach;with family/visitor present Nurse Communication: Mobility status PT Visit Diagnosis:  Unsteadiness on feet (R26.81);Repeated falls (R29.6);Pain Pain - Right/Left: Left Pain - part of body: Shoulder    Time: 1352-1415 PT Time Calculation (min) (ACUTE ONLY): 23 min   Charges:   PT Evaluation $PT Eval Low Complexity: 1 Low          [place] Office [phone] Weekend [phone]   Claretha Cooper 01/09/2022, 2:50 PM

## 2022-01-09 NOTE — Social Work (Addendum)
Pt was approved by INS AUTH to go to [facility].TOC will update with DC information.

## 2022-01-09 NOTE — ED Triage Notes (Signed)
Pt arrived via POV with family. C/o leg swelling and pain, feeling unsteady on her feet. Intermittent high blood sugar and back pain. Endorses multiple falls recently.

## 2022-01-09 NOTE — Social Work (Signed)
CSW sent out Pt to [facility] which reports that they already spoke to Pt and POA. [facility] has accepted the Pt. The CSW sent out INS AUTH is awaiting that to come back. Pt will DC to [facility].

## 2022-01-09 NOTE — Progress Notes (Signed)
Transition of Care [facility]) - Emergency Department Mini Assessment   Patient Details  Name: Lindsey Baldwin MRN: [id number] Date of Birth: Oct 09, 1945  [facility]) CM/SW Contact:    Kimber Relic, LCSW Phone Number: 01/09/2022, 10:46 AM   Clinical Narrative: Pt came in to ED c/o leg swelling, pain, and reported falls. Per Seth Bake, pt's POA, the pt has dementia and she Seth Bake) handles everything for her. Seth Bake shared that she is having to go to court next week due to the pt being up for eviction as a result of unpaid rent. Seth Bake states she is looking to place the pt for LTC; however, she reports that she "hasn't had any help" in finding placement. Seth Bake reported that she did begin a Medicaid application for the pt on 8/28 and that "the social worker handed me two pieces of paper and said here, go do the leg work." Seth Bake prefers that the pt stays in [facility] and is interested in the pt going to [facility] since she was there previously. Seth Bake reported speaking with someone at [facility] and states "everybody told me this was the best way to do it. To go through the hospital and have her placed for rehab and then transition into long term care." This CSW informed Seth Bake that we can place the pt for short term care if PT recommends STR at a SNF. This CSW informed that the hospital does not work with DSS on the Medicaid process and assured her that the facility would inform her of any information needed to transition the pt to LTC. This CSW advised Seth Bake to contact the Medicaid Caseworker to inquire about the status of the pt's Medicaid application so that she can provide the SNF with any updates. The pt is currently awaiting PT's eval. TOC following.    ED Mini Assessment: What brought you to the Emergency Department? : leg swelling/pain  Barriers to Discharge: No Barriers Identified     Means of departure: Ambulance       Patient Contact and Communications Key Contact 1:  Laurena Bering (POA)     Contact Date: 01/09/22,   Contact time: 1027 Contact Phone Number: [phone] Call outcome: Agreeable to SNF placement.    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   Diabetic peripheral neuropathy associated with type 2 diabetes mellitus ([place]) 08/01/2021   Difficulty walking 08/01/2021   Edema 08/01/2021   Elevated liver transaminase level 08/01/2021   Gallstones 08/01/2021   Gastroesophageal reflux disease 08/01/2021   Generalized intra-abdominal and pelvic swelling, mass and lump 08/01/2021   Glaucoma 08/01/2021   Hardening of the aorta (main artery of the heart) ([place]) 08/01/2021   Hyperglycemia due to type 2 diabetes mellitus ([place]) 08/01/2021   Kidney stone 08/01/2021   Osteoporosis 08/01/2021   Other intervertebral disc degeneration, lumbar region 08/01/2021   Overactive bladder 08/01/2021   Personal history of colonic polyps 08/01/2021   Pure hypercholesterolemia 08/01/2021   Restless legs syndrome 08/01/2021   Steatosis of liver 08/01/2021   Generalized abdominal pain 08/01/2021   Pneumonia due to COVID-19 virus 02/15/2019   Acute respiratory failure due to  COVID-19 ([place]) 02/15/2019   DM2 (diabetes mellitus, type 2) ([place]) 02/15/2019   Essential hypertension 02/15/2019   Hyperlipemia 02/15/2019   OSA on CPAP 02/15/2019   Osteoarthritis of right knee 12/12/2017   Legally blind 10/30/2017   Ptosis of eyelid 05/08/2016   Swelling of eyelid 05/08/2016   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

## 2022-01-09 NOTE — NC FL2 (Signed)
[place] LEVEL OF CARE SCREENING TOOL     IDENTIFICATION  Patient Name: Lindsey Baldwin Birthdate: October 16, 1945 Sex: female Admission Date (Current Location): 01/09/2022  [facility] and [place] Number:  Herbalist and Address:  [facility],  [place] [address] [place]      Provider Number:    Attending Physician Name and Address:  Default, Provider, MD  Relative Name and Phone Number:  Laurena Bering POA # [phone]    Current Level of Care: Hospital Recommended Level of Care: [place] Prior Approval Number:    Date Approved/Denied: 01/09/22 PASRR Number: [id number]  Discharge Plan: SNF    Current Diagnoses: Patient Active Problem List   Diagnosis Date Noted   Memory loss 08/01/2021   Asymptomatic varicose veins of bilateral lower extremities 08/01/2021   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   Diabetic peripheral neuropathy associated with type 2 diabetes mellitus ([place]) 08/01/2021   Difficulty walking 08/01/2021   Edema 08/01/2021   Elevated liver transaminase level 08/01/2021   Gallstones 08/01/2021   Gastroesophageal reflux disease 08/01/2021   Generalized intra-abdominal and pelvic swelling, mass and lump 08/01/2021   Glaucoma 08/01/2021   Hardening of the aorta (main artery of the heart) ([place]) 08/01/2021   Hyperglycemia due to type 2 diabetes mellitus ([place]) 08/01/2021   Kidney stone 08/01/2021   Osteoporosis 08/01/2021   Other intervertebral disc degeneration, lumbar region 08/01/2021   Overactive bladder 08/01/2021   Personal history of colonic polyps 08/01/2021   Pure hypercholesterolemia 08/01/2021   Restless legs syndrome 08/01/2021   Steatosis of liver 08/01/2021   Generalized abdominal pain 08/01/2021   Pneumonia due to COVID-19 virus 02/15/2019   Acute respiratory failure due to COVID-19 ([place]) 02/15/2019   DM2 (diabetes mellitus, type 2) ([place]) 02/15/2019   Essential hypertension  02/15/2019   Hyperlipemia 02/15/2019   OSA on CPAP 02/15/2019   Osteoarthritis of right knee 12/12/2017   Legally blind 10/30/2017   Ptosis of eyelid 05/08/2016   Swelling of eyelid 05/08/2016   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   Retinopathy of prematurity of right eye 02/04/2011    Orientation RESPIRATION BLADDER Height & Weight     Self, Time, Situation, Place  Normal Continent Weight:   Height:     BEHAVIORAL SYMPTOMS/MOOD NEUROLOGICAL BOWEL NUTRITION STATUS      Continent    AMBULATORY STATUS COMMUNICATION OF NEEDS Skin   Limited Assist Verbally                         Personal Care Assistance Level of Assistance  Bathing, Feeding, Dressing Bathing Assistance: Limited assistance Feeding assistance: Limited assistance Dressing Assistance: Limited assistance     Functional Limitations Info  Sight, Hearing, Speech Sight Info: Adequate Hearing Info: Adequate Speech Info: Adequate    SPECIAL CARE FACTORS FREQUENCY                       Contractures      Additional Factors Info  Code Status, Allergies Code Status Info: Prior Allergies Info: Apraclonidine Hcl Not Specified Unspecified Other (See Comments) Unknown reaction Pt does not recall  Penicillins Not Specified Allergy Swelling, Other (See Comments) Has patient had a PCN reaction causing immediate rash, facial/tongue/throat swelling, SOB or lightheadedness with hypotension: YES Has patient had a PCN reaction causing severe rash involving mucus membranes or skin necrosis: NO Has patient had a PCN reaction that required hospitalization: NO Has patient had  a PCN reaction occurring within the last 10 years: NO If all of the above answers are "NO", then may proceed with Cephalosporin use.  Sulfa Antibiotics           Current Medications (01/09/2022):  This is the current hospital active medication list Current Facility-Administered Medications  Medication Dose Route Frequency Provider Last Rate Last Admin    aspirin chewable tablet 81 mg  81 mg Oral Daily Cristie Hem, MD   81 mg at 01/09/22 1410   atorvastatin (LIPITOR) tablet 40 mg  40 mg Oral QPM Cristie Hem, MD       enalapril (VASOTEC) tablet 10 mg  10 mg Oral Daily Cristie Hem, MD   10 mg at 01/09/22 1410   famotidine (PEPCID) tablet 20 mg  20 mg Oral Daily PRN Cristie Hem, MD       furosemide (LASIX) tablet 20 mg  20 mg Oral Daily Cristie Hem, MD   20 mg at 01/09/22 1409   latanoprost (XALATAN) 0.005 % ophthalmic solution 1 drop  1 drop Right Eye QHS Cristie Hem, MD       linagliptin (TRADJENTA) tablet 5 mg  5 mg Oral Daily Cristie Hem, MD   5 mg at 01/09/22 1410   metFORMIN (GLUCOPHAGE-XR) 24 hr tablet 1,000 mg  1,000 mg Oral Q supper Cristie Hem, MD       pilocarpine (PILOCAR) 1 % ophthalmic solution 1 drop  1 drop Right Eye QID Cristie Hem, MD   1 drop at 01/09/22 1411   rivastigmine (EXELON) capsule 3 mg  3 mg Oral BID Cristie Hem, MD   3 mg at 01/09/22 1409   timolol (TIMOPTIC) 0.5 % ophthalmic solution 1 drop  1 drop Right Eye BID Cristie Hem, MD   1 drop at 01/09/22 1411   Current Outpatient Medications  Medication Sig Dispense Refill   acetaminophen (TYLENOL) 500 MG tablet Take 500 mg by mouth every 6 (six) hours as needed for moderate pain.     alendronate (FOSAMAX) 70 MG tablet Take 70 mg by mouth every Monday.     ASPIRIN 81 PO Take 81 mg by mouth daily.     atorvastatin (LIPITOR) 40 MG tablet Take 40 mg by mouth in the morning.     calcium carbonate (TUMS - DOSED IN MG ELEMENTAL CALCIUM) 500 MG chewable tablet Chew 500 mg by mouth 4 (four) times daily as needed for heartburn.     enalapril (VASOTEC) 10 MG tablet Take 10 mg by mouth daily.     famotidine (PEPCID) 20 MG tablet Take 1 tablet (20 mg total) by mouth daily. (Patient taking differently: Take 20 mg by mouth daily as needed for heartburn or indigestion.)     furosemide (LASIX) 20 MG  tablet Take 20 mg by mouth every other day.     ibuprofen (ADVIL) 200 MG tablet Take 400 mg by mouth daily as needed for headache or mild pain.     latanoprost (XALATAN) 0.005 % ophthalmic solution Place 1 drop into the right eye at bedtime.     loratadine (CLARITIN) 10 MG tablet Take 10 mg by mouth daily as needed for allergies.     Menthol, Topical Analgesic, (BIOFREEZE) 10 % CREA Apply 1 Application topically daily as needed (pain).     metFORMIN (GLUCOPHAGE-XR) 500 MG 24 hr tablet Take 1,000 mg by mouth at bedtime.     Multiple Vitamins-Minerals (MULTIVITAMIN WITH MINERALS) tablet  Take 1 tablet by mouth daily.     naproxen sodium (ALEVE) 220 MG tablet Take 440 mg by mouth daily as needed (pain).     pilocarpine (PILOCAR) 1 % ophthalmic solution Place 1 drop into the right eye 4 (four) times daily.     rivastigmine (EXELON) 3 MG capsule Take 1 capsule (3 mg total) by mouth 2 (two) times daily. 60 capsule 6   sitaGLIPtin (JANUVIA) 100 MG tablet Take 100 mg by mouth daily.     solifenacin (VESICARE) 10 MG tablet Take 10 mg by mouth daily.     timolol (BETIMOL) 0.5 % ophthalmic solution Place 1 drop into the right eye 2 (two) times daily.     insulin aspart (NOVOLOG) 100 UNIT/ML injection 0-15 Units, Subcutaneous, 3 times daily with meals CBG < 70: Implement Hypoglycemia protocol CBG 70 - 120: 0 units CBG 121 - 150: 2 units CBG 151 - 200: 3 units CBG 201 - 250: 5 units CBG 251 - 300: 8 units CBG 301 - 350: 11 units CBG 351 - 400: 15 units CBG > 400: call MD (Patient not taking: Reported on 01/09/2022) 10 mL 11   metoprolol tartrate (LOPRESSOR) 25 MG tablet Take 0.5 tablets (12.5 mg total) by mouth 2 (two) times daily. (Patient not taking: Reported on 11/01/2021)     MYRBETRIQ 50 MG TB24 tablet TAKE 1 TABLET EACH DAY. (Patient not taking: Reported on 01/09/2022) 30 tablet PRN   tamsulosin (FLOMAX) 0.4 MG CAPS capsule TAKE 1 CAPSULE BY MOUTH EVERY DAY (Patient not taking: Reported on 11/01/2021)  30 capsule 3   traMADol (ULTRAM) 50 MG tablet Take 1 tablet (50 mg total) by mouth every 6 (six) hours as needed for severe pain (Pain not controlled by Tylenol, ibuprofen, ice, rest, etc.). (Patient not taking: Reported on 01/09/2022) 5 tablet 0     Discharge Medications: Please see discharge summary for a list of discharge medications.  Relevant Imaging Results:  Relevant Lab Results:   Additional Information [place] Voyd Groft LCSW-A Pt SSI # [id number]  Rodney Booze, [place]

## 2022-01-09 NOTE — ED Provider Notes (Signed)
[facility] DEPT Provider Note  CSN: [id number] Arrival date & time: 01/09/22 0757  Chief Complaint(s) Leg Swelling  HPI Lindsey Baldwin is a 76 y.o. female with history of hypertension, dementia, diabetes presenting with multiple complaints.  Patient's family reports that patient fell a few weeks ago, since then has had some lower back pain worse with movement.  Reportedly did hit her head and has had some residual headaches.  No numbness or tingling.  No weakness.  She also has chronic edema of the bilateral lower extremities, worse on the right, unclear if this is worse or not but it is impacting patient's ambulation.  No fevers or chills, redness.  Patient's daughter reports that the primary reason she brought her in was because patient is no longer able to take care of herself at home and lives alone.  She also has an unstable living environment.  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   Diabetic peripheral neuropathy associated with type 2 diabetes mellitus ([place]) 08/01/2021   Difficulty walking 08/01/2021   Edema 08/01/2021   Elevated liver transaminase level 08/01/2021   Gallstones 08/01/2021   Gastroesophageal reflux disease 08/01/2021   Generalized intra-abdominal and pelvic swelling, mass and lump 08/01/2021   Glaucoma 08/01/2021   Hardening of the aorta  (main artery of the heart) ([place]) 08/01/2021   Hyperglycemia due to type 2 diabetes mellitus ([place]) 08/01/2021   Kidney stone 08/01/2021   Osteoporosis 08/01/2021   Other intervertebral disc degeneration, lumbar region 08/01/2021   Overactive bladder 08/01/2021   Personal history of colonic polyps 08/01/2021   Pure hypercholesterolemia 08/01/2021   Restless legs syndrome 08/01/2021   Steatosis of liver 08/01/2021   Generalized abdominal pain 08/01/2021   Pneumonia due to COVID-19 virus 02/15/2019   Acute respiratory failure due to COVID-19 ([place]) 02/15/2019   DM2 (diabetes mellitus, type 2) ([place]) 02/15/2019   Essential hypertension 02/15/2019   Hyperlipemia 02/15/2019   OSA on CPAP 02/15/2019   Osteoarthritis of right knee 12/12/2017   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 Authorizing Provider  acetaminophen (TYLENOL) 500 MG tablet Take 500 mg by mouth every 6 (six) hours as needed for moderate pain.   Yes [provider]  alendronate (FOSAMAX) 70 MG tablet Take 70 mg by mouth every Monday.   Yes [provider]  ASPIRIN 81 PO Take 81 mg by mouth daily.   Yes [provider]  atorvastatin (LIPITOR) 40 MG tablet Take 40 mg by mouth in the morning.   Yes  [provider]  calcium carbonate (TUMS - DOSED IN MG ELEMENTAL CALCIUM) 500 MG chewable tablet Chew 500 mg by mouth 4 (four) times daily as needed for heartburn.   Yes [provider]  enalapril (VASOTEC) 10 MG tablet Take 10 mg by mouth daily.   Yes [provider]  famotidine (PEPCID) 20 MG tablet Take 1 tablet (20 mg total) by mouth daily. Patient taking differently: Take 20 mg by mouth daily as needed for heartburn or indigestion. 03/14/19  Yes  Ghimire, Henreitta Leber, MD  furosemide (LASIX) 20 MG tablet Take 20 mg by mouth every other day.   Yes [provider]  ibuprofen (ADVIL) 200 MG tablet Take 400 mg by mouth daily as needed for headache or mild pain.   Yes [provider]  latanoprost (XALATAN) 0.005 % ophthalmic solution Place 1 drop into the right eye at bedtime.   Yes [provider]  loratadine (CLARITIN) 10 MG tablet Take 10 mg by mouth daily as needed for allergies.   Yes [provider]  Menthol, Topical Analgesic, (BIOFREEZE) 10 % CREA Apply 1 Application topically daily as needed (pain).   Yes [provider]  metFORMIN (GLUCOPHAGE-XR) 500 MG 24 hr tablet Take 1,000 mg by mouth at bedtime. 04/21/18  Yes [provider]  Multiple Vitamins-Minerals (MULTIVITAMIN WITH MINERALS) tablet Take 1 tablet by mouth daily.   Yes [provider]  naproxen sodium (ALEVE) 220 MG tablet Take 440 mg by mouth daily as needed (pain).   Yes [provider]  pilocarpine (PILOCAR) 1 % ophthalmic solution Place 1 drop into the right eye 4 (four) times daily.   Yes [provider]  rivastigmine (EXELON) 3 MG capsule Take 1 capsule (3 mg total) by mouth 2 (two) times daily. 08/01/21 02/27/22 Yes Camara, Maryan Puls, MD  sitaGLIPtin (JANUVIA) 100 MG tablet Take 100 mg by mouth daily.   Yes [provider]  solifenacin (VESICARE) 10 MG tablet Take 10 mg by mouth daily.   Yes [provider]  timolol (BETIMOL) 0.5 % ophthalmic solution Place 1 drop into the right eye 2 (two) times daily.   Yes [provider]  insulin aspart (NOVOLOG) 100 UNIT/ML injection 0-15 Units, Subcutaneous, 3 times daily with meals CBG < 70: Implement Hypoglycemia protocol CBG 70 - 120: 0 units CBG 121 - 150: 2 units CBG 151 - 200: 3 units CBG 201 - 250: 5 units CBG 251 - 300: 8 units CBG 301 - 350: 11 units CBG 351 - 400: 15 units CBG > 400: call MD Patient not taking:  Reported on 01/09/2022 03/13/19   Jonetta Osgood, MD  metoprolol tartrate (LOPRESSOR) 25 MG tablet Take 0.5 tablets (12.5 mg total) by mouth 2 (two) times daily. Patient not taking: Reported on 11/01/2021 03/13/19   Jonetta Osgood, MD  MYRBETRIQ 50 MG TB24 tablet TAKE 1 TABLET EACH DAY. Patient not taking: Reported on 01/09/2022 11/23/20   Cleon Gustin, MD  tamsulosin (FLOMAX) 0.4 MG CAPS capsule TAKE 1 CAPSULE BY MOUTH EVERY DAY Patient not taking: Reported on 11/01/2021 07/15/19   Cleon Gustin, MD  traMADol (ULTRAM) 50 MG tablet Take 1 tablet (50 mg total) by mouth every 6 (six) hours as needed for severe pain (Pain not controlled by Tylenol, ibuprofen, ice, rest, etc.). Patient not taking: Reported on 01/09/2022 11/15/21   Clovis Riley, MD  Past Surgical History Past Surgical History:  Procedure Laterality Date   CHOLECYSTECTOMY     CYST EXCISION  08/2017   POSTERIOR NECK   CYSTOSCOPY WITH RETROGRADE PYELOGRAM, URETEROSCOPY AND STENT PLACEMENT Left 06/16/2018   Procedure: diagnostic URETEROSCOPY AND STENT PLACEMENT;  Surgeon: Cleon Gustin, MD;  Location: [facility];  Service: Urology;  Laterality: Left;  1 HR   EXCISION MASS UPPER EXTREMETIES Right 11/15/2021   Procedure: EXCISION OF SUBCUTANEOUS MASS RIGHT SHOULDER;  Surgeon: Clovis Riley, MD;  Location: [facility];  Service: General;  Laterality: Right;   EYE SURGERY Left 1992   [place] Right 12/12/2017   Procedure: RIGHT TOTAL KNEE ARTHROPLASTY WITH COMPUTER NAVIGATION;  Surgeon: Rod Can, MD;  Location: [facility];  Service: Orthopedics;  Laterality: Right;  Needs RNFA   TONSILLECTOMY  AGE 41   Family History Family History  Problem Relation Age of Onset   Cancer Mother    Cancer Father    Breast cancer Maternal Aunt      Social History Social History   Tobacco Use   Smoking status: Never   Smokeless tobacco: Never  Vaping Use   Vaping Use: Never used  Substance Use Topics   Alcohol use: Never   Drug use: Never   Allergies Apraclonidine hcl, Penicillins, and Sulfa antibiotics  Review of Systems Review of Systems  All other systems reviewed and are negative.   Physical Exam Vital Signs  I have reviewed the triage vital signs BP (!) 152/72 (BP Location: Right Arm)   Pulse 64   Temp 98 F (36.7 C) (Oral)   Resp 16   SpO2 98%  Physical Exam Vitals and nursing note reviewed.  Constitutional:      General: She is not in acute distress.    Appearance: She is well-developed.  HENT:     Head: Normocephalic and atraumatic.     Mouth/Throat:     Mouth: Mucous membranes are moist.  Eyes:     Pupils: Pupils are equal, round, and reactive to light.  Cardiovascular:     Rate and Rhythm: Normal rate and regular rhythm.     Heart sounds: No murmur heard. Pulmonary:     Effort: Pulmonary effort is normal. No respiratory distress.     Breath sounds: Normal breath sounds.  Abdominal:     General: Abdomen is flat.     Palpations: Abdomen is soft.     Tenderness: There is no abdominal tenderness.  Musculoskeletal:        General: No tenderness.     Comments: Bilateral lower extremity edema, mild tenderness, worse on the right.  No midline C, T, L-spine tenderness, bilateral paraspinal L-spine tenderness  Skin:    General: Skin is warm and dry.  Neurological:     General: No focal deficit present.     Mental Status: She is alert. Mental status is at baseline.     Comments: Strength 5 out of 5 in the bilateral lower extremities without sensory deficit  Psychiatric:        Mood and Affect: Mood normal.        Behavior: Behavior normal.     ED Results and Treatments Labs (all labs ordered are listed, but only abnormal results are displayed) Labs Reviewed  COMPREHENSIVE METABOLIC PANEL  - Abnormal; Notable for the following components:      Result Value   CO2 20 (*)    Glucose, Bld 176 (*)  All other components within normal limits  CBC WITH DIFFERENTIAL/PLATELET - Abnormal; Notable for the following components:   Platelets 144 (*)    All other components within normal limits  URINALYSIS, ROUTINE W REFLEX MICROSCOPIC - Abnormal; Notable for the following components:   Color, Urine STRAW (*)    Hgb urine dipstick SMALL (*)    Bacteria, UA RARE (*)    All other components within normal limits  CBG MONITORING, ED - Abnormal; Notable for the following components:   Glucose-Capillary 168 (*)    All other components within normal limits  RESP PANEL BY RT-PCR (FLU A&B, COVID) ARPGX2                                                                                                                          Radiology VAS [place] LOWER EXTREMITY VENOUS (DVT) (7a-7p)  Result Date: 01/09/2022  Lower Venous DVT Study Patient Name:  Lindsey Baldwin  Date of Exam:   01/09/2022 Medical Rec #: [id number]        Accession #:    [id number] Date of Birth: [birth year]        Patient Gender: F Patient Age:   89 years Exam Location:  [facility] Procedure:      VAS [place] LOWER EXTREMITY VENOUS (DVT) Referring Phys: Garnette Gunner --------------------------------------------------------------------------------  Indications: Pain.  Risk Factors: None identified. Limitations: Poor ultrasound/tissue interface. Comparison Study: No prior studies. Performing Technologist: Oliver Hum RVT  Examination Guidelines: A complete evaluation includes B-mode imaging, spectral Doppler, color Doppler, and power Doppler as needed of all accessible portions of each vessel. Bilateral testing is considered an integral part of a complete examination. Limited examinations for reoccurring indications may be performed as noted. The reflux portion of the exam is performed with the patient in reverse Trendelenburg.   +---------+---------------+---------+-----------+----------+--------------+ RIGHT    CompressibilityPhasicitySpontaneityPropertiesThrombus Aging +---------+---------------+---------+-----------+----------+--------------+ CFV      Full           Yes      Yes                                 +---------+---------------+---------+-----------+----------+--------------+ SFJ      Full                                                        +---------+---------------+---------+-----------+----------+--------------+ FV Prox  Full                                                        +---------+---------------+---------+-----------+----------+--------------+ FV Mid   Full                                                        +---------+---------------+---------+-----------+----------+--------------+  FV DistalFull                                                        +---------+---------------+---------+-----------+----------+--------------+ PFV      Full                                                        +---------+---------------+---------+-----------+----------+--------------+ POP      Full           Yes      Yes                                 +---------+---------------+---------+-----------+----------+--------------+ PTV      Full                                                        +---------+---------------+---------+-----------+----------+--------------+ PERO     Full                                                        +---------+---------------+---------+-----------+----------+--------------+   +---------+---------------+---------+-----------+----------+--------------+ LEFT     CompressibilityPhasicitySpontaneityPropertiesThrombus Aging +---------+---------------+---------+-----------+----------+--------------+ CFV      Full           Yes      Yes                                  +---------+---------------+---------+-----------+----------+--------------+ SFJ      Full                                                        +---------+---------------+---------+-----------+----------+--------------+ FV Prox  Full                                                        +---------+---------------+---------+-----------+----------+--------------+ FV Mid   Full                                                        +---------+---------------+---------+-----------+----------+--------------+ FV DistalFull                                                        +---------+---------------+---------+-----------+----------+--------------+  PFV      Full                                                        +---------+---------------+---------+-----------+----------+--------------+ POP      Full           Yes      Yes                                 +---------+---------------+---------+-----------+----------+--------------+ PTV      Full                                                        +---------+---------------+---------+-----------+----------+--------------+ PERO     Full                                                        +---------+---------------+---------+-----------+----------+--------------+    Summary: RIGHT: - There is no evidence of deep vein thrombosis in the lower extremity.  - No cystic structure found in the popliteal fossa.  LEFT: - There is no evidence of deep vein thrombosis in the lower extremity.  - No cystic structure found in the popliteal fossa.  *See table(s) above for measurements and observations.    Preliminary    CT Head Wo Contrast  Result Date: 01/09/2022 CLINICAL DATA:  Trauma EXAM: CT HEAD WITHOUT CONTRAST TECHNIQUE: Contiguous axial images were obtained from the base of the skull through the vertex without intravenous contrast. RADIATION DOSE REDUCTION: This exam was performed according to the departmental  dose-optimization program which includes automated exposure control, adjustment of the mA and/or kV according to patient size and/or use of iterative reconstruction technique. COMPARISON:  None Available. FINDINGS: Brain: No evidence of acute infarction, hemorrhage, hydrocephalus, extra-axial collection or mass lesion/mass effect. Sequela of chronic microvascular ischemic change. Vascular: No hyperdense vessel or unexpected calcification. Skull: Normal. Negative for fracture or focal lesion. Sinuses/Orbits: Left globe prosthesis and lid weight place. Mild mucosal thickening right maxillary sinus. There is impacted cerumen bilateral EACs. Other: None. IMPRESSION: No CT evidence of intracranial injury. Electronically Signed   By: Marin Roberts M.D.   On: 01/09/2022 09:28   DG Lumbar Spine Complete  Result Date: 01/09/2022 CLINICAL DATA:  Back pain.  Unsteady on her feet. EXAM: LUMBAR SPINE - COMPLETE 4 VIEW COMPARISON:  None Available. FINDINGS: There is no evidence of lumbar spine fracture.  Alignment is normal. There are severe lower lumbar spine predominant degenerative changes with moderate disc space loss at L4-L5 and L5-S1. There are moderate bilateral facet degenerative changes at L3-L4 and severe facet degenerative changes L4-L5 and L5-S1. Calcified uterine fibroid is visualized. IMPRESSION: Severe lower lumbar spine predominant degenerative changes with moderate disc space loss at L4-L5 and L5-S1. Electronically Signed   By: Marin Roberts M.D.   On: 01/09/2022 09:23    Pertinent labs & imaging results that were available during my care of the patient were reviewed  by me and considered in my medical decision making (see MDM for details).  Medications Ordered in ED Medications  aspirin chewable tablet 81 mg (has no administration in time range)  atorvastatin (LIPITOR) tablet 40 mg (has no administration in time range)  enalapril (VASOTEC) tablet 10 mg (has no administration in time range)   famotidine (PEPCID) tablet 20 mg (has no administration in time range)  furosemide (LASIX) tablet 20 mg (has no administration in time range)  latanoprost (XALATAN) 0.005 % ophthalmic solution 1 drop (has no administration in time range)  metFORMIN (GLUCOPHAGE-XR) 24 hr tablet 1,000 mg (has no administration in time range)  mirabegron ER (MYRBETRIQ) tablet 50 mg (has no administration in time range)  rivastigmine (EXELON) capsule 3 mg (has no administration in time range)  linagliptin (TRADJENTA) tablet 5 mg (has no administration in time range)  pilocarpine (PILOCAR) 1 % ophthalmic solution 1 drop (has no administration in time range)                                                                                                                                     Procedures Procedures  (including critical care time)  Medical Decision Making / ED Course   MDM:  76 year old female presenting with multiple complaints.  On further history, patient's daughter brought her in primarily because she is unable to take care of herself and is requesting placement.  Will evaluate bilateral lower extremity swelling with ultrasound, suspect chronic venous stasis changes, patient not short of breath, not hypoxic, lungs clear, low concern for CHF.  Patient also reports that she hit her head a few weeks ago, has had some residual headaches will obtain CT scan, also has some low back pains without any red flag symptoms so will obtain x-ray.  Doubt fracture, spinal cord compression, cauda equina syndrome.  Doubt occult infectious process.   Clinical Course as of 01/09/22 1329  Tue Jan 09, 2022  1325 Medical work-up overall unremarkable.  Patient placed in TOC border status.  Patient awaiting evaluation by occupational physical therapy to help determine whether patient meets criteria for placement.  Social work has already seen the patient and her daughter. [WS]    Clinical Course User Index [WS]  Truett Mainland, Livingston Diones, MD     Additional history obtained: -Additional history obtained from family -External records from outside source obtained and reviewed including: Chart review including previous notes, labs, imaging, consultation notes   Lab Tests: -I ordered, reviewed, and interpreted labs.   The pertinent results include:   Labs Reviewed  COMPREHENSIVE METABOLIC PANEL - Abnormal; Notable for the following components:      Result Value   CO2 20 (*)    Glucose, Bld 176 (*)    All other components within normal limits  CBC WITH DIFFERENTIAL/PLATELET - Abnormal; Notable for the following components:   Platelets 144 (*)    All other components within normal limits  URINALYSIS, ROUTINE W REFLEX MICROSCOPIC - Abnormal; Notable for the following components:   Color, Urine STRAW (*)    Hgb urine dipstick SMALL (*)    Bacteria, UA RARE (*)    All other components within normal limits  CBG MONITORING, ED - Abnormal; Notable for the following components:   Glucose-Capillary 168 (*)    All other components within normal limits  RESP PANEL BY RT-PCR (FLU A&B, COVID) ARPGX2      EKG   EKG Interpretation  Date/Time:    Ventricular Rate:    PR Interval:    QRS Duration:   QT Interval:    QTC Calculation:   R Axis:     Text Interpretation:           Imaging Studies ordered: I ordered imaging studies including CT head, DVT US LE On my interpretation imaging demonstrates no acute process I independently visualized and interpreted imaging. I agree with the radiologist interpretation   Medicines ordered and prescription drug management: Meds ordered this encounter  Medications   aspirin chewable tablet 81 mg   atorvastatin (LIPITOR) tablet 40 mg   enalapril (VASOTEC) tablet 10 mg   famotidine (PEPCID) tablet 20 mg   furosemide (LASIX) tablet 20 mg   latanoprost (XALATAN) 0.005 % ophthalmic solution 1 drop   metFORMIN (GLUCOPHAGE-XR) 24 hr tablet 1,000 mg    mirabegron ER (MYRBETRIQ) tablet 50 mg   rivastigmine (EXELON) capsule 3 mg   linagliptin (TRADJENTA) tablet 5 mg   DISCONTD: timolol (BETIMOL) 0.5 % ophthalmic solution 1 drop   pilocarpine (PILOCAR) 1 % ophthalmic solution 1 drop    -I have reviewed the patients home medicines and have made adjustments as needed   Consultations Obtained: I requested consultation with the social worker,  and discussed lab and imaging findings as well as pertinent plan - they recommend: PT eval for placement   Cardiac Monitoring: The patient was maintained on a cardiac monitor.  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  Despite best efforts to proofread,  errors can occur which can change the documentation meaning.    Cristie Hem, MD 01/09/22 1330

## 2022-01-09 NOTE — Evaluation (Signed)
Occupational Therapy Evaluation Patient Details Name: Lindsey Baldwin MRN: [id number] DOB: 10-02-1945 Today's Date: 01/09/2022   History of Present Illness patient is a 76 year old female who presented with low back pain after a fall a few weeks prior with unstable living environment. CT revealed "Severe lower lumbar spine predominant degenerative changes with moderate disc space loss at L4-L5 and L5-S1."PMH: osteoporosis, overactive bladder, HTN, type II DM, glaucoma R eye, dementia   Clinical Impression   Patient is a 76 year old female who was living in ILF PLOF prior to admission to hospital. Patient currently is unable to return to ILF at current state with need for min guard with standing balance and max A for LB dressing/bathing tasks. Patient was noted to have decreased standing balance, decreased safety awareness, decreased functional activity tolerance impacting participation in Adls. Patient would continue to benefit from skilled OT services at this time while admitted and after d/c to address noted deficits in order to improve overall safety and independence in ADLs.       Recommendations for follow up therapy are one component of a multi-disciplinary discharge planning process, led by the attending physician.  Recommendations may be updated based on patient status, additional functional criteria and insurance authorization.   Follow Up Recommendations  Skilled nursing-short term rehab (<3 hours/day)    Assistance Recommended at Discharge Frequent or constant Supervision/Assistance  Patient can return home with the following A little help with walking and/or transfers;Assist for transportation;Direct supervision/assist for medications management;Assistance with cooking/housework;Direct supervision/assist for financial management;Help with stairs or ramp for entrance    Functional Status Assessment  Patient has had a recent decline in their functional status and demonstrates the  ability to make significant improvements in function in a reasonable and predictable amount of time.  Equipment Recommendations       Recommendations for Other Services       Precautions / Restrictions Precautions Precautions: Fall Precaution Comments: LEGALLY BLIND, LEFT EYE PROSTHESIS      Mobility Bed Mobility Overal bed mobility: Needs Assistance Bed Mobility: Supine to Sit, Sit to Supine     Supine to sit: Supervision Sit to supine: Supervision        Transfers Overall transfer level: Needs assistance Equipment used: Rolling walker (2 wheels) Transfers: Sit to/from Stand Sit to Stand: Min guard                  Balance Overall balance assessment: Needs assistance, History of Falls Sitting-balance support: No upper extremity supported, Feet supported Sitting balance-Leahy Scale: Good     Standing balance support: Single extremity supported, During functional activity, Reliant on assistive device for balance Standing balance-Leahy Scale: Poor                             ADL either performed or assessed with clinical judgement   ADL Overall ADL's : Needs assistance/impaired     Grooming: Set up;Sitting   Upper Body Bathing: Min guard;Sitting   Lower Body Bathing: Moderate assistance;Sit to/from stand;Sitting/lateral leans   Upper Body Dressing : Minimal assistance;Sitting   Lower Body Dressing: Sit to/from stand;Sitting/lateral leans;Maximal assistance Lower Body Dressing Details (indicate cue type and reason): patient was max A for donning socks sitting EOB with increased time. Toilet Transfer: Minimal Print production planner Details (indicate cue type and reason): with RW. noted to have LOB during turn with cane with increased time. Toileting- Water quality scientist and Hygiene: Sit to/from  stand;Moderate assistance Toileting - Clothing Manipulation Details (indicate cue type and reason): needed BUE support to maintain balance with  simulated tasks             Vision Patient Visual Report: No change from baseline       Perception     Praxis      Pertinent Vitals/Pain Pain Assessment Pain Assessment: 0-10 Pain Score: 4  Pain Location: low back     Hand Dominance Right   Extremity/Trunk Assessment Upper Extremity Assessment Upper Extremity Assessment: RUE deficits/detail;LUE deficits/detail RUE Deficits / Details: patient was noted to have about 40 degrees FF and abduction bilaterally. was able to reach top of head one at a time. h/o abscess removal on R side   Lower Extremity Assessment Lower Extremity Assessment: Defer to PT evaluation   Cervical / Trunk Assessment Cervical / Trunk Assessment: Normal   Communication Communication Communication: No difficulties   Cognition Arousal/Alertness: Awake/alert Behavior During Therapy: WFL for tasks assessed/performed Overall Cognitive Status: History of cognitive impairments - at baseline                                 General Comments: oriented to place, follow directions     General Comments       Exercises     Shoulder Instructions      Home Living Family/patient expects to be discharged to:: Skilled nursing facility Living Arrangements: Alone   Type of Home: Independent living facility Home Access: Level entry     Home Layout: One level                          Prior Functioning/Environment Prior Level of Function : Needs assist       Physical Assist : Mobility (physical)     Mobility Comments: used rollator or cane          OT Problem List: Decreased activity tolerance;Pain;Impaired UE functional use;Decreased knowledge of use of DME or AE;Decreased safety awareness;Decreased knowledge of precautions;Cardiopulmonary status limiting activity      OT Treatment/Interventions: Self-care/ADL training;DME and/or AE instruction;Therapeutic activities;Balance training;Therapeutic  exercise;Neuromuscular education;Patient/family education    OT Goals(Current goals can be found in the care plan section) Acute Rehab OT Goals Patient Stated Goal: to get balance better OT Goal Formulation: With patient/family Time For Goal Achievement: 01/23/22 Potential to Achieve Goals: Fair  OT Frequency: Min 2X/week    Co-evaluation PT/OT/SLP Co-Evaluation/Treatment: Yes Reason for Co-Treatment: For patient/therapist safety;To address functional/ADL transfers PT goals addressed during session: Mobility/safety with mobility OT goals addressed during session: ADL's and self-care      AM-PAC OT "6 Clicks" Daily Activity     Outcome Measure Help from another person eating meals?: None Help from another person taking care of personal grooming?: A Little Help from another person toileting, which includes using toliet, bedpan, or urinal?: A Lot Help from another person bathing (including washing, rinsing, drying)?: A Lot Help from another person to put on and taking off regular upper body clothing?: A Little Help from another person to put on and taking off regular lower body clothing?: A Lot 6 Click Score: 16   End of Session Nurse Communication: Other (comment) (updated on patients participation in session)  Activity Tolerance: Patient tolerated treatment well Patient left: in bed;with call bell/phone within reach;with family/visitor present  OT Visit Diagnosis: Unsteadiness on feet (R26.81);Other abnormalities of gait and mobility (  R26.89)                Time: 0761-9155 OT Time Calculation (min): 20 min Charges:  OT General Charges $OT Visit: 1 Visit OT Evaluation $OT Eval Moderate Complexity: 1 Mod  Jahmire Ruffins OTR/L, MS Acute Rehabilitation Department Office# [phone]   Marcellina Millin 01/09/2022, 3:27 PM

## 2022-01-10 DIAGNOSIS — Z96651 Presence of right artificial knee joint: Secondary | ICD-10-CM | POA: Diagnosis not present

## 2022-01-10 DIAGNOSIS — G4733 Obstructive sleep apnea (adult) (pediatric): Secondary | ICD-10-CM | POA: Diagnosis not present

## 2022-01-10 DIAGNOSIS — G8929 Other chronic pain: Secondary | ICD-10-CM | POA: Diagnosis not present

## 2022-01-10 DIAGNOSIS — R1310 Dysphagia, unspecified: Secondary | ICD-10-CM | POA: Diagnosis not present

## 2022-01-10 DIAGNOSIS — M1990 Unspecified osteoarthritis, unspecified site: Secondary | ICD-10-CM | POA: Diagnosis not present

## 2022-01-10 DIAGNOSIS — R2681 Unsteadiness on feet: Secondary | ICD-10-CM | POA: Diagnosis not present

## 2022-01-10 DIAGNOSIS — K219 Gastro-esophageal reflux disease without esophagitis: Secondary | ICD-10-CM | POA: Diagnosis not present

## 2022-01-10 DIAGNOSIS — E1142 Type 2 diabetes mellitus with diabetic polyneuropathy: Secondary | ICD-10-CM | POA: Diagnosis not present

## 2022-01-10 DIAGNOSIS — E1140 Type 2 diabetes mellitus with diabetic neuropathy, unspecified: Secondary | ICD-10-CM | POA: Diagnosis not present

## 2022-01-10 DIAGNOSIS — Z794 Long term (current) use of insulin: Secondary | ICD-10-CM | POA: Diagnosis not present

## 2022-01-10 DIAGNOSIS — R5381 Other malaise: Secondary | ICD-10-CM | POA: Diagnosis not present

## 2022-01-10 DIAGNOSIS — E785 Hyperlipidemia, unspecified: Secondary | ICD-10-CM | POA: Diagnosis not present

## 2022-01-10 DIAGNOSIS — Z79899 Other long term (current) drug therapy: Secondary | ICD-10-CM | POA: Diagnosis not present

## 2022-01-10 DIAGNOSIS — D696 Thrombocytopenia, unspecified: Secondary | ICD-10-CM | POA: Diagnosis not present

## 2022-01-10 DIAGNOSIS — I110 Hypertensive heart disease with heart failure: Secondary | ICD-10-CM | POA: Diagnosis not present

## 2022-01-10 DIAGNOSIS — Z7185 Encounter for immunization safety counseling: Secondary | ICD-10-CM | POA: Diagnosis not present

## 2022-01-10 DIAGNOSIS — Z20822 Contact with and (suspected) exposure to covid-19: Secondary | ICD-10-CM | POA: Diagnosis not present

## 2022-01-10 DIAGNOSIS — I509 Heart failure, unspecified: Secondary | ICD-10-CM | POA: Diagnosis not present

## 2022-01-10 DIAGNOSIS — Z741 Need for assistance with personal care: Secondary | ICD-10-CM | POA: Diagnosis not present

## 2022-01-10 DIAGNOSIS — Z9181 History of falling: Secondary | ICD-10-CM | POA: Diagnosis not present

## 2022-01-10 DIAGNOSIS — R609 Edema, unspecified: Secondary | ICD-10-CM | POA: Diagnosis not present

## 2022-01-10 DIAGNOSIS — Z23 Encounter for immunization: Secondary | ICD-10-CM | POA: Diagnosis not present

## 2022-01-10 DIAGNOSIS — M6281 Muscle weakness (generalized): Secondary | ICD-10-CM | POA: Diagnosis not present

## 2022-01-10 DIAGNOSIS — I10 Essential (primary) hypertension: Secondary | ICD-10-CM | POA: Diagnosis not present

## 2022-01-10 DIAGNOSIS — M519 Unspecified thoracic, thoracolumbar and lumbosacral intervertebral disc disorder: Secondary | ICD-10-CM | POA: Diagnosis not present

## 2022-01-10 DIAGNOSIS — Z7984 Long term (current) use of oral hypoglycemic drugs: Secondary | ICD-10-CM | POA: Diagnosis not present

## 2022-01-10 DIAGNOSIS — H409 Unspecified glaucoma: Secondary | ICD-10-CM | POA: Diagnosis not present

## 2022-01-10 DIAGNOSIS — R2689 Other abnormalities of gait and mobility: Secondary | ICD-10-CM | POA: Diagnosis not present

## 2022-01-10 DIAGNOSIS — Z7982 Long term (current) use of aspirin: Secondary | ICD-10-CM | POA: Diagnosis not present

## 2022-01-10 DIAGNOSIS — Z8616 Personal history of COVID-19: Secondary | ICD-10-CM | POA: Diagnosis not present

## 2022-01-10 DIAGNOSIS — R600 Localized edema: Secondary | ICD-10-CM | POA: Diagnosis not present

## 2022-01-10 DIAGNOSIS — M810 Age-related osteoporosis without current pathological fracture: Secondary | ICD-10-CM | POA: Diagnosis not present

## 2022-01-10 DIAGNOSIS — M545 Low back pain, unspecified: Secondary | ICD-10-CM | POA: Diagnosis not present

## 2022-01-10 NOTE — ED Notes (Signed)
Report called to [facility]. Spoke w/ Carlyon Shadow, LPN to give report.

## 2022-01-10 NOTE — Progress Notes (Addendum)
This CSW has accepted [employer] in the hub. Pt to be d/c to SNF today. TOC following.  Addend @ 9:28 AM This CSW attempted to contact [facility] to inquire about room assignment/report #. Left HIPAA Compliant voicemail. This CSW contacted Anne Shutter, to obtain DSS Caseworker contact info. This CSW attempted to contact DSS caseworker Ozella Almond at [phone]. [place]'s email is: [email or web address]. TOC following.   Addend @ 10:32 AM DSS Case Worker, Cathlean Cower, called back and stated that they did not need an FL2 directly from the hospital. Cathlean Cower shared that they will work with the facility to receive the [facility] and approve her Medicaid once received This information has been communicated to [place], [place]. She states she understands. Medicaid #: [id number] K. Seth Bake, [place], to transport pt to SNF.

## 2022-01-10 NOTE — Progress Notes (Signed)
This CSW has faxed AVS to Admissions rep, [facility], and notified Sharrie Rothman, RN, of room assignment and report #.

## 2022-01-10 NOTE — ED Provider Notes (Signed)
Emergency Medicine Observation Re-evaluation Note  Lindsey Baldwin is a 76 y.o. female, seen on rounds today.  Pt initially presented to the ED for complaints of Leg Swelling Currently, the patient is resting comfortably.  Physical Exam  BP 137/74   Pulse 67   Temp 97.9 F (36.6 C) (Oral)   Resp 18   SpO2 99%  Physical Exam General: No acute distress  EKG:   I have reviewed the labs performed to date as well as medications administered while in observation.  Recent changes in the last 24 hours include none.  Plan  Current plan is for placement today.    Lacretia Leigh, MD 01/10/22 [phone]

## 2022-01-10 NOTE — Discharge Instructions (Addendum)
Go to the new facility

## 2022-01-11 DIAGNOSIS — G4733 Obstructive sleep apnea (adult) (pediatric): Secondary | ICD-10-CM | POA: Diagnosis not present

## 2022-01-11 DIAGNOSIS — H409 Unspecified glaucoma: Secondary | ICD-10-CM | POA: Diagnosis not present

## 2022-01-11 DIAGNOSIS — E785 Hyperlipidemia, unspecified: Secondary | ICD-10-CM | POA: Diagnosis not present

## 2022-01-11 DIAGNOSIS — M519 Unspecified thoracic, thoracolumbar and lumbosacral intervertebral disc disorder: Secondary | ICD-10-CM | POA: Diagnosis not present

## 2022-01-11 DIAGNOSIS — K219 Gastro-esophageal reflux disease without esophagitis: Secondary | ICD-10-CM | POA: Diagnosis not present

## 2022-01-11 DIAGNOSIS — I509 Heart failure, unspecified: Secondary | ICD-10-CM | POA: Diagnosis not present

## 2022-01-11 DIAGNOSIS — E1142 Type 2 diabetes mellitus with diabetic polyneuropathy: Secondary | ICD-10-CM | POA: Diagnosis not present

## 2022-01-11 DIAGNOSIS — D696 Thrombocytopenia, unspecified: Secondary | ICD-10-CM | POA: Diagnosis not present

## 2022-01-11 DIAGNOSIS — I110 Hypertensive heart disease with heart failure: Secondary | ICD-10-CM | POA: Diagnosis not present

## 2022-01-11 DIAGNOSIS — R5381 Other malaise: Secondary | ICD-10-CM | POA: Diagnosis not present

## 2022-01-11 DIAGNOSIS — R609 Edema, unspecified: Secondary | ICD-10-CM | POA: Diagnosis not present

## 2022-01-16 ENCOUNTER — Encounter: Payer: Self-pay | Admitting: Neurology

## 2022-01-17 ENCOUNTER — Other Ambulatory Visit: Payer: Self-pay | Admitting: *Deleted

## 2022-01-17 DIAGNOSIS — Z1231 Encounter for screening mammogram for malignant neoplasm of breast: Secondary | ICD-10-CM

## 2022-01-17 DIAGNOSIS — M1990 Unspecified osteoarthritis, unspecified site: Secondary | ICD-10-CM | POA: Diagnosis not present

## 2022-01-17 DIAGNOSIS — R5381 Other malaise: Secondary | ICD-10-CM | POA: Diagnosis not present

## 2022-01-17 DIAGNOSIS — E1142 Type 2 diabetes mellitus with diabetic polyneuropathy: Secondary | ICD-10-CM | POA: Diagnosis not present

## 2022-01-17 DIAGNOSIS — G8929 Other chronic pain: Secondary | ICD-10-CM | POA: Diagnosis not present

## 2022-01-17 DIAGNOSIS — M545 Low back pain, unspecified: Secondary | ICD-10-CM | POA: Diagnosis not present

## 2022-01-17 DIAGNOSIS — R2681 Unsteadiness on feet: Secondary | ICD-10-CM | POA: Diagnosis not present

## 2022-01-17 DIAGNOSIS — M6281 Muscle weakness (generalized): Secondary | ICD-10-CM | POA: Diagnosis not present

## 2022-01-17 DIAGNOSIS — M810 Age-related osteoporosis without current pathological fracture: Secondary | ICD-10-CM | POA: Diagnosis not present

## 2022-01-17 NOTE — Patient Outreach (Signed)
Lindsey Baldwin resides in [facility]. Screening for potential care coordination needs as benefit of insurance plan and PCP.   Update received from [place], [place] indicating Ms. Grismore received [facility] with last covered day 01/19/22. Will remain long term care.    No identifiable care coordination needs.   Marthenia Rolling, MSN, RN,BSN [place] Acute Care Coordinator [phone] (Direct dial)

## 2022-01-18 DIAGNOSIS — M810 Age-related osteoporosis without current pathological fracture: Secondary | ICD-10-CM | POA: Diagnosis not present

## 2022-01-18 DIAGNOSIS — E1142 Type 2 diabetes mellitus with diabetic polyneuropathy: Secondary | ICD-10-CM | POA: Diagnosis not present

## 2022-01-18 DIAGNOSIS — G8929 Other chronic pain: Secondary | ICD-10-CM | POA: Diagnosis not present

## 2022-01-18 DIAGNOSIS — M545 Low back pain, unspecified: Secondary | ICD-10-CM | POA: Diagnosis not present

## 2022-01-18 DIAGNOSIS — M1990 Unspecified osteoarthritis, unspecified site: Secondary | ICD-10-CM | POA: Diagnosis not present

## 2022-01-18 DIAGNOSIS — M6281 Muscle weakness (generalized): Secondary | ICD-10-CM | POA: Diagnosis not present

## 2022-01-18 DIAGNOSIS — R5381 Other malaise: Secondary | ICD-10-CM | POA: Diagnosis not present

## 2022-01-18 DIAGNOSIS — R2681 Unsteadiness on feet: Secondary | ICD-10-CM | POA: Diagnosis not present

## 2022-01-22 DIAGNOSIS — R2681 Unsteadiness on feet: Secondary | ICD-10-CM | POA: Diagnosis not present

## 2022-01-22 DIAGNOSIS — M6281 Muscle weakness (generalized): Secondary | ICD-10-CM | POA: Diagnosis not present

## 2022-01-22 DIAGNOSIS — M810 Age-related osteoporosis without current pathological fracture: Secondary | ICD-10-CM | POA: Diagnosis not present

## 2022-01-22 DIAGNOSIS — E1142 Type 2 diabetes mellitus with diabetic polyneuropathy: Secondary | ICD-10-CM | POA: Diagnosis not present

## 2022-01-22 DIAGNOSIS — R5381 Other malaise: Secondary | ICD-10-CM | POA: Diagnosis not present

## 2022-01-22 DIAGNOSIS — M545 Low back pain, unspecified: Secondary | ICD-10-CM | POA: Diagnosis not present

## 2022-01-22 DIAGNOSIS — M1990 Unspecified osteoarthritis, unspecified site: Secondary | ICD-10-CM | POA: Diagnosis not present

## 2022-01-22 DIAGNOSIS — G8929 Other chronic pain: Secondary | ICD-10-CM | POA: Diagnosis not present

## 2022-01-30 DIAGNOSIS — I509 Heart failure, unspecified: Secondary | ICD-10-CM | POA: Diagnosis not present

## 2022-01-30 DIAGNOSIS — G4733 Obstructive sleep apnea (adult) (pediatric): Secondary | ICD-10-CM | POA: Diagnosis not present

## 2022-01-30 DIAGNOSIS — E1169 Type 2 diabetes mellitus with other specified complication: Secondary | ICD-10-CM | POA: Diagnosis not present

## 2022-01-30 DIAGNOSIS — R6889 Other general symptoms and signs: Secondary | ICD-10-CM | POA: Diagnosis not present

## 2022-01-30 DIAGNOSIS — R031 Nonspecific low blood-pressure reading: Secondary | ICD-10-CM | POA: Diagnosis not present

## 2022-01-30 DIAGNOSIS — Z7984 Long term (current) use of oral hypoglycemic drugs: Secondary | ICD-10-CM | POA: Diagnosis not present

## 2022-01-30 DIAGNOSIS — K219 Gastro-esophageal reflux disease without esophagitis: Secondary | ICD-10-CM | POA: Diagnosis not present

## 2022-01-30 DIAGNOSIS — Z9989 Dependence on other enabling machines and devices: Secondary | ICD-10-CM | POA: Diagnosis not present

## 2022-02-01 DIAGNOSIS — R609 Edema, unspecified: Secondary | ICD-10-CM | POA: Diagnosis not present

## 2022-02-01 DIAGNOSIS — I10 Essential (primary) hypertension: Secondary | ICD-10-CM | POA: Diagnosis not present

## 2022-02-01 DIAGNOSIS — R031 Nonspecific low blood-pressure reading: Secondary | ICD-10-CM | POA: Diagnosis not present

## 2022-02-01 DIAGNOSIS — I959 Hypotension, unspecified: Secondary | ICD-10-CM | POA: Diagnosis not present

## 2022-02-05 ENCOUNTER — Encounter: Payer: Self-pay | Admitting: Neurology

## 2022-02-05 ENCOUNTER — Ambulatory Visit (INDEPENDENT_AMBULATORY_CARE_PROVIDER_SITE_OTHER): Payer: Medicare Other | Admitting: Neurology

## 2022-02-05 VITALS — BP 136/75 | HR 71 | Ht 63.0 in

## 2022-02-05 DIAGNOSIS — I700 Atherosclerosis of aorta: Secondary | ICD-10-CM

## 2022-02-05 DIAGNOSIS — E119 Type 2 diabetes mellitus without complications: Secondary | ICD-10-CM | POA: Diagnosis not present

## 2022-02-05 DIAGNOSIS — J9600 Acute respiratory failure, unspecified whether with hypoxia or hypercapnia: Secondary | ICD-10-CM

## 2022-02-05 DIAGNOSIS — F02A0 Dementia in other diseases classified elsewhere, mild, without behavioral disturbance, psychotic disturbance, mood disturbance, and anxiety: Secondary | ICD-10-CM

## 2022-02-05 DIAGNOSIS — G301 Alzheimer's disease with late onset: Secondary | ICD-10-CM | POA: Diagnosis not present

## 2022-02-05 MED ORDER — RIVASTIGMINE TARTRATE 4.5 MG PO CAPS: 4.5000 mg | ORAL_CAPSULE | Freq: Two times a day (BID) | ORAL | 6 refills | Status: DC

## 2022-02-05 NOTE — Patient Instructions (Signed)
Increase Exelon to 4.5 mg twice daily  Vitamin B 12 supplement Continue your current medications  Increase physical activity  Follow up in a year or sooner if worse

## 2022-02-05 NOTE — Progress Notes (Unsigned)
[facility]  PATIENT: Lindsey Baldwin DOB: 11-Mar-1946  REQUESTING CLINICIAN: Donald Prose, MD HISTORY FROM: Patient and friend Seth Bake REASON FOR VISIT: Memory deficit    HISTORICAL  CHIEF COMPLAINT:  Chief Complaint  Patient presents with   Follow-up    Rm 13. Accompanied by friend, Seth Bake. Memory f/u. [place] 23/30.   INTERVAL HISTORY 02/05/2022:  She is still at [place]:  This is a 76 year old woman with medical history including hypertension, hyperlipidemia, diabetes mellitus, overactive bladder who is presenting with her longtime friend Seth Bake for memory deficit.  Per Seth Bake who is now at the power of attorney, she reports patient has been suffering with memory for the past 2 years.  She has known patient for more than 30 years.  Memory deficits started by patient repeating herself, being forgetful asking the same questions over and over.  She also mismanaged her money.  She sold her house and her financial advisor set her up for the next 10 years but she mismanaged the money and ran out of money in 4 years.  Currently she is unable to pay her own rent.  She cannot explain how or where the money is gone.  On top of that she forgets to take her medications, there are bottles of medications all over her apartment and she is also forgetting doctors appointments.  There is also concern for patient on self-care.  Currently she does live in independent living facility and Seth Bake is trying to take her to a assisted living facility.  TBI:   No past history of TBI Stroke:   no past history of stroke Seizures:   no past history of seizures Sleep:  Yes, uses a CPAP machine.  Mood:  patient denies anxiety and depression  Functional status: dependent in some ADLs and IADLs Patient lives independent living.  Cooking: Meals are provided Cleaning: No Shopping: Yes  Bathing: self  Toileting: self Driving: Not driving  Bills:  Need helps with her bills, cannot manage her own affair anymore   Ever left the stove on by accident?: Does not cook  Forget how to use items around the house?: No  Getting lost going to familiar places?: No  Forgetting loved ones names?: Yes  Word finding difficulty? No  Sleep: 6 to 7 hrs    OTHER MEDICAL CONDITIONS: Hypertension, hyperlipidemia, diabetes mellitus type 2, overactive bladder   REVIEW OF SYSTEMS: Full 14 system review of systems performed and negative with exception of: as noted in the HPI   ALLERGIES: Allergies  Allergen Reactions   Apraclonidine Hcl Other (See Comments)    Unknown reaction Pt does not recall   Penicillins Swelling and Other (See Comments)    Has patient had a PCN reaction causing immediate rash, facial/tongue/throat swelling, SOB or lightheadedness with hypotension: YES Has patient had a PCN reaction causing severe rash involving mucus membranes or skin necrosis: NO Has patient had a PCN reaction that required hospitalization: NO Has patient had a PCN reaction occurring within the last 10 years: NO If all of the above answers are "NO", then may proceed with Cephalosporin use.    Sulfa Antibiotics Rash    HOME MEDICATIONS: Outpatient Medications Prior to Visit  Medication Sig Dispense Refill   acetaminophen (TYLENOL) 500 MG tablet Take 500 mg by mouth every 6 (six) hours as needed for moderate pain.     alendronate (FOSAMAX) 70 MG tablet Take 70 mg by mouth  every Monday.     ASPIRIN 81 PO Take 81 mg by mouth daily.     atorvastatin (LIPITOR) 40 MG tablet Take 40 mg by mouth in the morning.     calcium carbonate (TUMS - DOSED IN MG ELEMENTAL CALCIUM) 500 MG chewable tablet Chew 500 mg by mouth 4 (four) times daily as needed for heartburn.     Cholecalciferol (D3 2000) 50 MCG (2000 UT) CAPS Take 2,000 Units by mouth daily.     enalapril (VASOTEC) 10 MG tablet Take 10 mg by mouth daily.     famotidine (PEPCID) 20 MG tablet Take 1 tablet (20 mg  total) by mouth daily. (Patient taking differently: Take 20 mg by mouth daily as needed for heartburn or indigestion.)     furosemide (LASIX) 20 MG tablet Take 20 mg by mouth every other day.     guaiFENesin (MUCINEX) 600 MG 12 hr tablet Take 600 mg by mouth 2 (two) times daily as needed.     ibuprofen (ADVIL) 200 MG tablet Take 400 mg by mouth daily as needed for headache or mild pain.     insulin aspart (NOVOLOG) 100 UNIT/ML injection 0-15 Units, Subcutaneous, 3 times daily with meals CBG < 70: Implement Hypoglycemia protocol CBG 70 - 120: 0 units CBG 121 - 150: 2 units CBG 151 - 200: 3 units CBG 201 - 250: 5 units CBG 251 - 300: 8 units CBG 301 - 350: 11 units CBG 351 - 400: 15 units CBG > 400: call MD 10 mL 11   latanoprost (XALATAN) 0.005 % ophthalmic solution Place 1 drop into the right eye at bedtime.     lidocaine (XYLOCAINE) 4 % external solution Apply topically as needed.     loratadine (CLARITIN) 10 MG tablet Take 10 mg by mouth daily as needed for allergies.     Menthol, Topical Analgesic, (BIOFREEZE) 10 % CREA Apply 1 Application topically daily as needed (pain).     metFORMIN (GLUCOPHAGE-XR) 500 MG 24 hr tablet Take 1,000 mg by mouth at bedtime.     metoprolol tartrate (LOPRESSOR) 25 MG tablet Take 0.5 tablets (12.5 mg total) by mouth 2 (two) times daily.     miconazole (MICOTIN) 2 % cream Apply 1 Application topically 2 (two) times daily.     Multiple Vitamins-Minerals (MULTIVITAMIN WITH MINERALS) tablet Take 1 tablet by mouth daily.     MYRBETRIQ 50 MG TB24 tablet TAKE 1 TABLET EACH DAY. 30 tablet PRN   naproxen sodium (ALEVE) 220 MG tablet Take 440 mg by mouth daily as needed (pain).     pilocarpine (PILOCAR) 1 % ophthalmic solution Place 1 drop into the right eye 4 (four) times daily.     rivastigmine (EXELON) 3 MG capsule Take 1 capsule (3 mg total) by mouth 2 (two) times daily. 60 capsule 6   sitaGLIPtin (JANUVIA) 100 MG tablet Take 100 mg by mouth daily.      solifenacin (VESICARE) 10 MG tablet Take 10 mg by mouth daily.     tamsulosin (FLOMAX) 0.4 MG CAPS capsule TAKE 1 CAPSULE BY MOUTH EVERY DAY 30 capsule 3   traMADol (ULTRAM) 50 MG tablet Take 1 tablet (50 mg total) by mouth every 6 (six) hours as needed for severe pain (Pain not controlled by Tylenol, ibuprofen, ice, rest, etc.). 5 tablet 0   timolol (BETIMOL) 0.5 % ophthalmic solution Place 1 drop into the right eye 2 (two) times daily.     No facility-administered medications prior to visit.  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   Bronchitis    GERD (gastroesophageal reflux disease)    Glaucoma, right eye    CLOSED ANGLE   History of kidney stones    Hypertension    OA (osteoarthritis)    KNEES , HANDS   OAB (overactive bladder)    OSA on CPAP    Osteoporosis    Type 2 diabetes mellitus ([place])    FOLLOWED BY PCP   Wears glasses     PAST SURGICAL HISTORY: Past Surgical History:  Procedure Laterality Date   CHOLECYSTECTOMY     CYST EXCISION  08/2017   POSTERIOR NECK   CYSTOSCOPY WITH RETROGRADE PYELOGRAM, URETEROSCOPY AND STENT PLACEMENT Left 06/16/2018   Procedure: diagnostic URETEROSCOPY AND STENT PLACEMENT;  Surgeon: Cleon Gustin, MD;  Location: [facility];  Service: Urology;  Laterality: Left;  1 HR   EXCISION MASS UPPER EXTREMETIES Right 11/15/2021   Procedure: EXCISION OF SUBCUTANEOUS MASS RIGHT SHOULDER;  Surgeon: Clovis Riley, MD;  Location: [facility];  Service: General;  Laterality: Right;   EYE SURGERY Left 1992   [place] Right 12/12/2017   Procedure: RIGHT TOTAL KNEE ARTHROPLASTY WITH COMPUTER NAVIGATION;  Surgeon: Rod Can, MD;  Location: [facility];  Service: Orthopedics;  Laterality: Right;  Needs RNFA   TONSILLECTOMY  AGE 10    FAMILY HISTORY: Family History  Problem Relation Age of Onset   Cancer Mother    Cancer Father    Breast  cancer Maternal Aunt     SOCIAL HISTORY: Social History   Socioeconomic History   Marital status: Single    Spouse name: Not on file   Number of children: Not on file   Years of education: Not on file   Highest education level: Not on file  Occupational History   Not on file  Tobacco Use   Smoking status: Never   Smokeless tobacco: Never  Vaping Use   Vaping Use: Never used  Substance and Sexual Activity   Alcohol use: Never   Drug use: Never   Sexual activity: Yes    Birth control/protection: Post-menopausal  Other Topics Concern   Not on file  Social History Narrative   Not on file   Social Determinants of Health   Financial Resource Strain: Not on file  Food Insecurity: Not on file  Transportation Needs: Not on file  Physical Activity: Not on file  Stress: Not on file  Social Connections: Not on file  Intimate Partner Violence: Not on file    PHYSICAL EXAM  GENERAL EXAM/CONSTITUTIONAL: Vitals:  Vitals:   02/05/22 1403  BP: 136/75  Pulse: 71  Height: '5\' 3"'$  (1.6 m)   Body mass index is 29.58 kg/m. Wt Readings from Last 3 Encounters:  11/07/21 167 lb (75.8 kg)  08/01/21 179 lb (81.2 kg)  02/14/21 182 lb 15.7 oz (83 kg)   Patient is in no distress; well developed, nourished and groomed; neck is supple  MUSCULOSKELETAL: Gait, strength, tone, movements noted in Neurologic exam below  NEUROLOGIC: MENTAL STATUS:      No data to display            02/05/2022    2:12 PM 08/01/2021    3:11 PM  Montreal Cognitive Assessment   Visuospatial/ Executive (0/5) 3 3  Naming (0/3) 2 3  Attention: Read list of digits (0/2) 2 1  Attention: Read list of letters (0/1) 1 1  Attention: Serial 7 subtraction starting at 100 (0/3) 2 1  Language: Repeat phrase (0/2) 1 1  Language : Fluency (0/1) 0 0  Abstraction (0/2) 1 1  Delayed Recall (0/5) 4 3  Orientation (0/6) 6 5  Total 22 19  Adjusted Score (based on education) 23 20    CRANIAL NERVE:  5th - facial  sensation symmetric 7th - facial strength symmetric 8th - hearing intact 9th - palate elevates symmetrically, uvula midline 11th - shoulder shrug symmetric 12th - tongue protrusion midline  MOTOR:  normal bulk and tone, at least antigravity. There is bilateral lower extremities edema   SENSORY:  normal and symmetric to light touch  GAIT/STATION:  Uses a cane    DIAGNOSTIC DATA (LABS, IMAGING, TESTING) - I reviewed patient records, labs, notes, testing and imaging myself where available.  Lab Results  Component Value Date   WBC 7.2 01/09/2022   HGB 12.7 01/09/2022   HCT 37.7 01/09/2022   MCV 91.3 01/09/2022   PLT 144 (L) 01/09/2022      Component Value Date/Time   NA 138 01/09/2022 0932   K 4.1 01/09/2022 0932   CL 108 01/09/2022 0932   CO2 20 (L) 01/09/2022 0932   GLUCOSE 176 (H) 01/09/2022 0932   BUN 15 01/09/2022 0932   CREATININE 0.84 01/09/2022 0932   CALCIUM 9.8 01/09/2022 0932   PROT 6.8 01/09/2022 0932   ALBUMIN 4.1 01/09/2022 0932   AST 40 01/09/2022 0932   ALT 34 01/09/2022 0932   ALKPHOS 55 01/09/2022 0932   BILITOT 0.7 01/09/2022 0932   GFRNONAA >60 01/09/2022 0932   GFRAA >60 03/10/2019 0245   Lab Results  Component Value Date   TRIG 163 (H) 02/15/2019   Lab Results  Component Value Date   HGBA1C 8.0 (H) 11/07/2021   Lab Results  Component Value Date   VITAMINB12 309 08/01/2021   Lab Results  Component Value Date   TSH 2.890 08/01/2021     ASSESSMENT AND PLAN  76 y.o. year old female with hypertension, hyperlipidemia, diabetes mellitus who is presenting with memory decline for the past 2 years.  Memory decline described as being forgetful, missing appointments, forgetting to take her medications and mismanaging her money.  On exam today she scored 20 out of 30 on the [place].  Patient history and exam is consistent with mild late onset Alzheimer's dementia.  I will start her on rivastigmine 3 mg daily for 1 week then increase to 3 mg twice  daily.  Aricept was not approved by her insurance.  I will also obtain a thyroid function panel and vitamin B12 level.  I will see the patient in 6 months for follow-up, at that time we will consider increasing the rivastigmine or adding Namenda.  Return sooner if worse.   No diagnosis found.    There are no Patient Instructions on file for this visit.  No orders of the defined types were placed in this encounter.   No orders of the defined types were placed in this encounter.   No follow-ups on file.  I have spent a total of 47 minutes dedicated to this patient today, preparing to see patient, performing a medically appropriate examination and evaluation, ordering tests and/or medications and procedures, and counseling and educating the patient/family/caregiver; independently interpreting result and communicating results to the family/patient/caregiver; and documenting clinical information in the electronic medical record.   Alric Ran, MD 02/05/2022, 2:28 PM  [facility] [address], [address] [place], [place] [address] [phone] 

## 2022-02-06 DIAGNOSIS — I10 Essential (primary) hypertension: Secondary | ICD-10-CM | POA: Diagnosis not present

## 2022-02-06 DIAGNOSIS — I951 Orthostatic hypotension: Secondary | ICD-10-CM | POA: Diagnosis not present

## 2022-02-06 DIAGNOSIS — G4733 Obstructive sleep apnea (adult) (pediatric): Secondary | ICD-10-CM | POA: Diagnosis not present

## 2022-02-06 DIAGNOSIS — R0982 Postnasal drip: Secondary | ICD-10-CM | POA: Diagnosis not present

## 2022-02-06 DIAGNOSIS — R609 Edema, unspecified: Secondary | ICD-10-CM | POA: Diagnosis not present

## 2022-02-06 DIAGNOSIS — Z9989 Dependence on other enabling machines and devices: Secondary | ICD-10-CM | POA: Diagnosis not present

## 2022-02-06 DIAGNOSIS — Z7189 Other specified counseling: Secondary | ICD-10-CM | POA: Diagnosis not present

## 2022-02-06 MED ORDER — VITAMIN B-12 1000 MCG PO TABS: 1000.0000 ug | ORAL_TABLET | Freq: Every day | ORAL | 2 refills | Status: AC

## 2022-02-07 ENCOUNTER — Encounter: Payer: Self-pay | Admitting: Neurology

## 2022-02-08 DIAGNOSIS — R609 Edema, unspecified: Secondary | ICD-10-CM | POA: Diagnosis not present

## 2022-02-08 DIAGNOSIS — I951 Orthostatic hypotension: Secondary | ICD-10-CM | POA: Diagnosis not present

## 2022-02-20 DIAGNOSIS — E1142 Type 2 diabetes mellitus with diabetic polyneuropathy: Secondary | ICD-10-CM | POA: Diagnosis not present

## 2022-02-20 DIAGNOSIS — I951 Orthostatic hypotension: Secondary | ICD-10-CM | POA: Diagnosis not present

## 2022-02-20 DIAGNOSIS — E785 Hyperlipidemia, unspecified: Secondary | ICD-10-CM | POA: Diagnosis not present

## 2022-02-20 DIAGNOSIS — Z794 Long term (current) use of insulin: Secondary | ICD-10-CM | POA: Diagnosis not present

## 2022-02-20 DIAGNOSIS — Z7984 Long term (current) use of oral hypoglycemic drugs: Secondary | ICD-10-CM | POA: Diagnosis not present

## 2022-02-21 DIAGNOSIS — E119 Type 2 diabetes mellitus without complications: Secondary | ICD-10-CM | POA: Diagnosis not present

## 2022-02-26 DIAGNOSIS — E785 Hyperlipidemia, unspecified: Secondary | ICD-10-CM | POA: Diagnosis not present

## 2022-02-26 DIAGNOSIS — Z09 Encounter for follow-up examination after completed treatment for conditions other than malignant neoplasm: Secondary | ICD-10-CM | POA: Diagnosis not present

## 2022-02-26 DIAGNOSIS — E1169 Type 2 diabetes mellitus with other specified complication: Secondary | ICD-10-CM | POA: Diagnosis not present

## 2022-02-26 DIAGNOSIS — Z862 Personal history of diseases of the blood and blood-forming organs and certain disorders involving the immune mechanism: Secondary | ICD-10-CM | POA: Diagnosis not present

## 2022-03-09 DIAGNOSIS — M519 Unspecified thoracic, thoracolumbar and lumbosacral intervertebral disc disorder: Secondary | ICD-10-CM | POA: Diagnosis not present

## 2022-03-09 DIAGNOSIS — I509 Heart failure, unspecified: Secondary | ICD-10-CM | POA: Diagnosis not present

## 2022-03-09 DIAGNOSIS — E1140 Type 2 diabetes mellitus with diabetic neuropathy, unspecified: Secondary | ICD-10-CM | POA: Diagnosis not present

## 2022-03-09 DIAGNOSIS — G4733 Obstructive sleep apnea (adult) (pediatric): Secondary | ICD-10-CM | POA: Diagnosis not present

## 2022-03-09 DIAGNOSIS — R0982 Postnasal drip: Secondary | ICD-10-CM | POA: Diagnosis not present

## 2022-03-09 DIAGNOSIS — R5381 Other malaise: Secondary | ICD-10-CM | POA: Diagnosis not present

## 2022-03-09 DIAGNOSIS — Z0189 Encounter for other specified special examinations: Secondary | ICD-10-CM | POA: Diagnosis not present

## 2022-03-09 DIAGNOSIS — I951 Orthostatic hypotension: Secondary | ICD-10-CM | POA: Diagnosis not present

## 2022-03-09 DIAGNOSIS — H409 Unspecified glaucoma: Secondary | ICD-10-CM | POA: Diagnosis not present

## 2022-03-09 DIAGNOSIS — E1142 Type 2 diabetes mellitus with diabetic polyneuropathy: Secondary | ICD-10-CM | POA: Diagnosis not present

## 2022-03-09 DIAGNOSIS — B351 Tinea unguium: Secondary | ICD-10-CM | POA: Diagnosis not present

## 2022-03-09 DIAGNOSIS — E785 Hyperlipidemia, unspecified: Secondary | ICD-10-CM | POA: Diagnosis not present

## 2022-03-13 DIAGNOSIS — E1142 Type 2 diabetes mellitus with diabetic polyneuropathy: Secondary | ICD-10-CM | POA: Diagnosis not present

## 2022-03-13 DIAGNOSIS — R1310 Dysphagia, unspecified: Secondary | ICD-10-CM | POA: Diagnosis not present

## 2022-03-14 DIAGNOSIS — R1310 Dysphagia, unspecified: Secondary | ICD-10-CM | POA: Diagnosis not present

## 2022-03-14 DIAGNOSIS — E1142 Type 2 diabetes mellitus with diabetic polyneuropathy: Secondary | ICD-10-CM | POA: Diagnosis not present

## 2022-03-15 DIAGNOSIS — E1142 Type 2 diabetes mellitus with diabetic polyneuropathy: Secondary | ICD-10-CM | POA: Diagnosis not present

## 2022-03-15 DIAGNOSIS — R1310 Dysphagia, unspecified: Secondary | ICD-10-CM | POA: Diagnosis not present

## 2022-03-19 DIAGNOSIS — E1142 Type 2 diabetes mellitus with diabetic polyneuropathy: Secondary | ICD-10-CM | POA: Diagnosis not present

## 2022-03-19 DIAGNOSIS — R1310 Dysphagia, unspecified: Secondary | ICD-10-CM | POA: Diagnosis not present

## 2022-03-20 DIAGNOSIS — E1142 Type 2 diabetes mellitus with diabetic polyneuropathy: Secondary | ICD-10-CM | POA: Diagnosis not present

## 2022-03-20 DIAGNOSIS — R1310 Dysphagia, unspecified: Secondary | ICD-10-CM | POA: Diagnosis not present

## 2022-03-21 DIAGNOSIS — E1142 Type 2 diabetes mellitus with diabetic polyneuropathy: Secondary | ICD-10-CM | POA: Diagnosis not present

## 2022-03-21 DIAGNOSIS — R1310 Dysphagia, unspecified: Secondary | ICD-10-CM | POA: Diagnosis not present

## 2022-03-23 DIAGNOSIS — E1142 Type 2 diabetes mellitus with diabetic polyneuropathy: Secondary | ICD-10-CM | POA: Diagnosis not present

## 2022-03-23 DIAGNOSIS — R1310 Dysphagia, unspecified: Secondary | ICD-10-CM | POA: Diagnosis not present

## 2022-03-27 DIAGNOSIS — R051 Acute cough: Secondary | ICD-10-CM | POA: Diagnosis not present

## 2022-03-27 DIAGNOSIS — E1142 Type 2 diabetes mellitus with diabetic polyneuropathy: Secondary | ICD-10-CM | POA: Diagnosis not present

## 2022-03-27 DIAGNOSIS — R1310 Dysphagia, unspecified: Secondary | ICD-10-CM | POA: Diagnosis not present

## 2022-03-29 DIAGNOSIS — R1310 Dysphagia, unspecified: Secondary | ICD-10-CM | POA: Diagnosis not present

## 2022-03-29 DIAGNOSIS — E1142 Type 2 diabetes mellitus with diabetic polyneuropathy: Secondary | ICD-10-CM | POA: Diagnosis not present

## 2022-03-30 DIAGNOSIS — E1142 Type 2 diabetes mellitus with diabetic polyneuropathy: Secondary | ICD-10-CM | POA: Diagnosis not present

## 2022-03-30 DIAGNOSIS — R1310 Dysphagia, unspecified: Secondary | ICD-10-CM | POA: Diagnosis not present

## 2022-03-30 DIAGNOSIS — R5381 Other malaise: Secondary | ICD-10-CM | POA: Diagnosis not present

## 2022-03-30 DIAGNOSIS — M6281 Muscle weakness (generalized): Secondary | ICD-10-CM | POA: Diagnosis not present

## 2022-04-02 DIAGNOSIS — R1310 Dysphagia, unspecified: Secondary | ICD-10-CM | POA: Diagnosis not present

## 2022-04-02 DIAGNOSIS — E1142 Type 2 diabetes mellitus with diabetic polyneuropathy: Secondary | ICD-10-CM | POA: Diagnosis not present

## 2022-04-02 DIAGNOSIS — R2681 Unsteadiness on feet: Secondary | ICD-10-CM | POA: Diagnosis not present

## 2022-04-02 DIAGNOSIS — M6281 Muscle weakness (generalized): Secondary | ICD-10-CM | POA: Diagnosis not present

## 2022-04-03 DIAGNOSIS — E1142 Type 2 diabetes mellitus with diabetic polyneuropathy: Secondary | ICD-10-CM | POA: Diagnosis not present

## 2022-04-03 DIAGNOSIS — M6281 Muscle weakness (generalized): Secondary | ICD-10-CM | POA: Diagnosis not present

## 2022-04-03 DIAGNOSIS — R2681 Unsteadiness on feet: Secondary | ICD-10-CM | POA: Diagnosis not present

## 2022-04-03 DIAGNOSIS — R1310 Dysphagia, unspecified: Secondary | ICD-10-CM | POA: Diagnosis not present

## 2022-04-04 DIAGNOSIS — R2681 Unsteadiness on feet: Secondary | ICD-10-CM | POA: Diagnosis not present

## 2022-04-04 DIAGNOSIS — M6281 Muscle weakness (generalized): Secondary | ICD-10-CM | POA: Diagnosis not present

## 2022-04-04 DIAGNOSIS — R1310 Dysphagia, unspecified: Secondary | ICD-10-CM | POA: Diagnosis not present

## 2022-04-04 DIAGNOSIS — E1142 Type 2 diabetes mellitus with diabetic polyneuropathy: Secondary | ICD-10-CM | POA: Diagnosis not present

## 2022-04-05 DIAGNOSIS — R2681 Unsteadiness on feet: Secondary | ICD-10-CM | POA: Diagnosis not present

## 2022-04-05 DIAGNOSIS — R1310 Dysphagia, unspecified: Secondary | ICD-10-CM | POA: Diagnosis not present

## 2022-04-05 DIAGNOSIS — E1142 Type 2 diabetes mellitus with diabetic polyneuropathy: Secondary | ICD-10-CM | POA: Diagnosis not present

## 2022-04-05 DIAGNOSIS — M6281 Muscle weakness (generalized): Secondary | ICD-10-CM | POA: Diagnosis not present

## 2022-04-06 DIAGNOSIS — R2681 Unsteadiness on feet: Secondary | ICD-10-CM | POA: Diagnosis not present

## 2022-04-06 DIAGNOSIS — M6281 Muscle weakness (generalized): Secondary | ICD-10-CM | POA: Diagnosis not present

## 2022-04-06 DIAGNOSIS — E1142 Type 2 diabetes mellitus with diabetic polyneuropathy: Secondary | ICD-10-CM | POA: Diagnosis not present

## 2022-04-06 DIAGNOSIS — R1310 Dysphagia, unspecified: Secondary | ICD-10-CM | POA: Diagnosis not present

## 2022-04-09 DIAGNOSIS — R1310 Dysphagia, unspecified: Secondary | ICD-10-CM | POA: Diagnosis not present

## 2022-04-09 DIAGNOSIS — Z9989 Dependence on other enabling machines and devices: Secondary | ICD-10-CM | POA: Diagnosis not present

## 2022-04-09 DIAGNOSIS — E1142 Type 2 diabetes mellitus with diabetic polyneuropathy: Secondary | ICD-10-CM | POA: Diagnosis not present

## 2022-04-09 DIAGNOSIS — M6281 Muscle weakness (generalized): Secondary | ICD-10-CM | POA: Diagnosis not present

## 2022-04-09 DIAGNOSIS — R42 Dizziness and giddiness: Secondary | ICD-10-CM | POA: Diagnosis not present

## 2022-04-09 DIAGNOSIS — I10 Essential (primary) hypertension: Secondary | ICD-10-CM | POA: Diagnosis not present

## 2022-04-09 DIAGNOSIS — Z7984 Long term (current) use of oral hypoglycemic drugs: Secondary | ICD-10-CM | POA: Diagnosis not present

## 2022-04-09 DIAGNOSIS — R2681 Unsteadiness on feet: Secondary | ICD-10-CM | POA: Diagnosis not present

## 2022-04-09 DIAGNOSIS — Z8679 Personal history of other diseases of the circulatory system: Secondary | ICD-10-CM | POA: Diagnosis not present

## 2022-04-09 DIAGNOSIS — R609 Edema, unspecified: Secondary | ICD-10-CM | POA: Diagnosis not present

## 2022-04-10 DIAGNOSIS — R1310 Dysphagia, unspecified: Secondary | ICD-10-CM | POA: Diagnosis not present

## 2022-04-10 DIAGNOSIS — M6281 Muscle weakness (generalized): Secondary | ICD-10-CM | POA: Diagnosis not present

## 2022-04-10 DIAGNOSIS — R2681 Unsteadiness on feet: Secondary | ICD-10-CM | POA: Diagnosis not present

## 2022-04-10 DIAGNOSIS — E1142 Type 2 diabetes mellitus with diabetic polyneuropathy: Secondary | ICD-10-CM | POA: Diagnosis not present

## 2022-04-11 DIAGNOSIS — M6281 Muscle weakness (generalized): Secondary | ICD-10-CM | POA: Diagnosis not present

## 2022-04-11 DIAGNOSIS — E1142 Type 2 diabetes mellitus with diabetic polyneuropathy: Secondary | ICD-10-CM | POA: Diagnosis not present

## 2022-04-11 DIAGNOSIS — R2681 Unsteadiness on feet: Secondary | ICD-10-CM | POA: Diagnosis not present

## 2022-04-11 DIAGNOSIS — R1310 Dysphagia, unspecified: Secondary | ICD-10-CM | POA: Diagnosis not present

## 2022-04-12 DIAGNOSIS — R1310 Dysphagia, unspecified: Secondary | ICD-10-CM | POA: Diagnosis not present

## 2022-04-12 DIAGNOSIS — E1142 Type 2 diabetes mellitus with diabetic polyneuropathy: Secondary | ICD-10-CM | POA: Diagnosis not present

## 2022-04-12 DIAGNOSIS — R42 Dizziness and giddiness: Secondary | ICD-10-CM | POA: Diagnosis not present

## 2022-04-12 DIAGNOSIS — R2681 Unsteadiness on feet: Secondary | ICD-10-CM | POA: Diagnosis not present

## 2022-04-12 DIAGNOSIS — M6281 Muscle weakness (generalized): Secondary | ICD-10-CM | POA: Diagnosis not present

## 2022-04-13 DIAGNOSIS — M6281 Muscle weakness (generalized): Secondary | ICD-10-CM | POA: Diagnosis not present

## 2022-04-13 DIAGNOSIS — R2681 Unsteadiness on feet: Secondary | ICD-10-CM | POA: Diagnosis not present

## 2022-04-13 DIAGNOSIS — E1142 Type 2 diabetes mellitus with diabetic polyneuropathy: Secondary | ICD-10-CM | POA: Diagnosis not present

## 2022-04-13 DIAGNOSIS — R1310 Dysphagia, unspecified: Secondary | ICD-10-CM | POA: Diagnosis not present

## 2022-04-14 DIAGNOSIS — R2681 Unsteadiness on feet: Secondary | ICD-10-CM | POA: Diagnosis not present

## 2022-04-14 DIAGNOSIS — R1310 Dysphagia, unspecified: Secondary | ICD-10-CM | POA: Diagnosis not present

## 2022-04-14 DIAGNOSIS — E1142 Type 2 diabetes mellitus with diabetic polyneuropathy: Secondary | ICD-10-CM | POA: Diagnosis not present

## 2022-04-14 DIAGNOSIS — M6281 Muscle weakness (generalized): Secondary | ICD-10-CM | POA: Diagnosis not present

## 2022-04-16 DIAGNOSIS — R5381 Other malaise: Secondary | ICD-10-CM | POA: Diagnosis not present

## 2022-04-16 DIAGNOSIS — E1142 Type 2 diabetes mellitus with diabetic polyneuropathy: Secondary | ICD-10-CM | POA: Diagnosis not present

## 2022-04-16 DIAGNOSIS — R2681 Unsteadiness on feet: Secondary | ICD-10-CM | POA: Diagnosis not present

## 2022-04-16 DIAGNOSIS — M6281 Muscle weakness (generalized): Secondary | ICD-10-CM | POA: Diagnosis not present

## 2022-04-16 DIAGNOSIS — R1310 Dysphagia, unspecified: Secondary | ICD-10-CM | POA: Diagnosis not present

## 2022-04-17 DIAGNOSIS — E1142 Type 2 diabetes mellitus with diabetic polyneuropathy: Secondary | ICD-10-CM | POA: Diagnosis not present

## 2022-04-17 DIAGNOSIS — M6281 Muscle weakness (generalized): Secondary | ICD-10-CM | POA: Diagnosis not present

## 2022-04-17 DIAGNOSIS — R1310 Dysphagia, unspecified: Secondary | ICD-10-CM | POA: Diagnosis not present

## 2022-04-17 DIAGNOSIS — R2681 Unsteadiness on feet: Secondary | ICD-10-CM | POA: Diagnosis not present

## 2022-04-18 DIAGNOSIS — E1142 Type 2 diabetes mellitus with diabetic polyneuropathy: Secondary | ICD-10-CM | POA: Diagnosis not present

## 2022-04-18 DIAGNOSIS — M6281 Muscle weakness (generalized): Secondary | ICD-10-CM | POA: Diagnosis not present

## 2022-04-18 DIAGNOSIS — R1310 Dysphagia, unspecified: Secondary | ICD-10-CM | POA: Diagnosis not present

## 2022-04-18 DIAGNOSIS — R2681 Unsteadiness on feet: Secondary | ICD-10-CM | POA: Diagnosis not present

## 2022-04-19 DIAGNOSIS — R2681 Unsteadiness on feet: Secondary | ICD-10-CM | POA: Diagnosis not present

## 2022-04-19 DIAGNOSIS — E1142 Type 2 diabetes mellitus with diabetic polyneuropathy: Secondary | ICD-10-CM | POA: Diagnosis not present

## 2022-04-19 DIAGNOSIS — M6281 Muscle weakness (generalized): Secondary | ICD-10-CM | POA: Diagnosis not present

## 2022-04-19 DIAGNOSIS — R1310 Dysphagia, unspecified: Secondary | ICD-10-CM | POA: Diagnosis not present

## 2022-04-20 DIAGNOSIS — R2681 Unsteadiness on feet: Secondary | ICD-10-CM | POA: Diagnosis not present

## 2022-04-20 DIAGNOSIS — R1310 Dysphagia, unspecified: Secondary | ICD-10-CM | POA: Diagnosis not present

## 2022-04-20 DIAGNOSIS — E1142 Type 2 diabetes mellitus with diabetic polyneuropathy: Secondary | ICD-10-CM | POA: Diagnosis not present

## 2022-04-20 DIAGNOSIS — M6281 Muscle weakness (generalized): Secondary | ICD-10-CM | POA: Diagnosis not present

## 2022-04-23 DIAGNOSIS — Z9989 Dependence on other enabling machines and devices: Secondary | ICD-10-CM | POA: Diagnosis not present

## 2022-04-23 DIAGNOSIS — M6281 Muscle weakness (generalized): Secondary | ICD-10-CM | POA: Diagnosis not present

## 2022-04-23 DIAGNOSIS — Z794 Long term (current) use of insulin: Secondary | ICD-10-CM | POA: Diagnosis not present

## 2022-04-23 DIAGNOSIS — Z7984 Long term (current) use of oral hypoglycemic drugs: Secondary | ICD-10-CM | POA: Diagnosis not present

## 2022-04-23 DIAGNOSIS — J40 Bronchitis, not specified as acute or chronic: Secondary | ICD-10-CM | POA: Diagnosis not present

## 2022-04-23 DIAGNOSIS — E1142 Type 2 diabetes mellitus with diabetic polyneuropathy: Secondary | ICD-10-CM | POA: Diagnosis not present

## 2022-04-23 DIAGNOSIS — R1310 Dysphagia, unspecified: Secondary | ICD-10-CM | POA: Diagnosis not present

## 2022-04-23 DIAGNOSIS — Z09 Encounter for follow-up examination after completed treatment for conditions other than malignant neoplasm: Secondary | ICD-10-CM | POA: Diagnosis not present

## 2022-04-23 DIAGNOSIS — Z8679 Personal history of other diseases of the circulatory system: Secondary | ICD-10-CM | POA: Diagnosis not present

## 2022-04-23 DIAGNOSIS — R2681 Unsteadiness on feet: Secondary | ICD-10-CM | POA: Diagnosis not present

## 2022-04-24 DIAGNOSIS — R2681 Unsteadiness on feet: Secondary | ICD-10-CM | POA: Diagnosis not present

## 2022-04-24 DIAGNOSIS — R1310 Dysphagia, unspecified: Secondary | ICD-10-CM | POA: Diagnosis not present

## 2022-04-24 DIAGNOSIS — M6281 Muscle weakness (generalized): Secondary | ICD-10-CM | POA: Diagnosis not present

## 2022-04-24 DIAGNOSIS — E1142 Type 2 diabetes mellitus with diabetic polyneuropathy: Secondary | ICD-10-CM | POA: Diagnosis not present

## 2022-04-25 DIAGNOSIS — R2681 Unsteadiness on feet: Secondary | ICD-10-CM | POA: Diagnosis not present

## 2022-04-25 DIAGNOSIS — M6281 Muscle weakness (generalized): Secondary | ICD-10-CM | POA: Diagnosis not present

## 2022-04-25 DIAGNOSIS — E1142 Type 2 diabetes mellitus with diabetic polyneuropathy: Secondary | ICD-10-CM | POA: Diagnosis not present

## 2022-04-25 DIAGNOSIS — R1310 Dysphagia, unspecified: Secondary | ICD-10-CM | POA: Diagnosis not present

## 2022-04-26 DIAGNOSIS — R2681 Unsteadiness on feet: Secondary | ICD-10-CM | POA: Diagnosis not present

## 2022-04-26 DIAGNOSIS — E1142 Type 2 diabetes mellitus with diabetic polyneuropathy: Secondary | ICD-10-CM | POA: Diagnosis not present

## 2022-04-26 DIAGNOSIS — H401114 Primary open-angle glaucoma, right eye, indeterminate stage: Secondary | ICD-10-CM | POA: Diagnosis not present

## 2022-04-26 DIAGNOSIS — M6281 Muscle weakness (generalized): Secondary | ICD-10-CM | POA: Diagnosis not present

## 2022-04-26 DIAGNOSIS — R1310 Dysphagia, unspecified: Secondary | ICD-10-CM | POA: Diagnosis not present

## 2022-04-27 DIAGNOSIS — E1142 Type 2 diabetes mellitus with diabetic polyneuropathy: Secondary | ICD-10-CM | POA: Diagnosis not present

## 2022-04-27 DIAGNOSIS — R1310 Dysphagia, unspecified: Secondary | ICD-10-CM | POA: Diagnosis not present

## 2022-04-27 DIAGNOSIS — R2681 Unsteadiness on feet: Secondary | ICD-10-CM | POA: Diagnosis not present

## 2022-04-27 DIAGNOSIS — M6281 Muscle weakness (generalized): Secondary | ICD-10-CM | POA: Diagnosis not present

## 2022-04-30 DIAGNOSIS — E1142 Type 2 diabetes mellitus with diabetic polyneuropathy: Secondary | ICD-10-CM | POA: Diagnosis not present

## 2022-04-30 DIAGNOSIS — M6281 Muscle weakness (generalized): Secondary | ICD-10-CM | POA: Diagnosis not present

## 2022-04-30 DIAGNOSIS — R2681 Unsteadiness on feet: Secondary | ICD-10-CM | POA: Diagnosis not present

## 2022-04-30 DIAGNOSIS — R1310 Dysphagia, unspecified: Secondary | ICD-10-CM | POA: Diagnosis not present

## 2022-05-02 DIAGNOSIS — R1310 Dysphagia, unspecified: Secondary | ICD-10-CM | POA: Diagnosis not present

## 2022-05-02 DIAGNOSIS — E1142 Type 2 diabetes mellitus with diabetic polyneuropathy: Secondary | ICD-10-CM | POA: Diagnosis not present

## 2022-05-02 DIAGNOSIS — M6281 Muscle weakness (generalized): Secondary | ICD-10-CM | POA: Diagnosis not present

## 2022-05-02 DIAGNOSIS — R2681 Unsteadiness on feet: Secondary | ICD-10-CM | POA: Diagnosis not present

## 2022-05-03 DIAGNOSIS — M6281 Muscle weakness (generalized): Secondary | ICD-10-CM | POA: Diagnosis not present

## 2022-05-03 DIAGNOSIS — R2681 Unsteadiness on feet: Secondary | ICD-10-CM | POA: Diagnosis not present

## 2022-05-03 DIAGNOSIS — E1142 Type 2 diabetes mellitus with diabetic polyneuropathy: Secondary | ICD-10-CM | POA: Diagnosis not present

## 2022-05-04 DIAGNOSIS — R2681 Unsteadiness on feet: Secondary | ICD-10-CM | POA: Diagnosis not present

## 2022-05-04 DIAGNOSIS — M6281 Muscle weakness (generalized): Secondary | ICD-10-CM | POA: Diagnosis not present

## 2022-05-04 DIAGNOSIS — E1142 Type 2 diabetes mellitus with diabetic polyneuropathy: Secondary | ICD-10-CM | POA: Diagnosis not present

## 2022-05-09 DIAGNOSIS — R2681 Unsteadiness on feet: Secondary | ICD-10-CM | POA: Diagnosis not present

## 2022-05-09 DIAGNOSIS — E1142 Type 2 diabetes mellitus with diabetic polyneuropathy: Secondary | ICD-10-CM | POA: Diagnosis not present

## 2022-05-09 DIAGNOSIS — M6281 Muscle weakness (generalized): Secondary | ICD-10-CM | POA: Diagnosis not present

## 2022-05-10 DIAGNOSIS — E1142 Type 2 diabetes mellitus with diabetic polyneuropathy: Secondary | ICD-10-CM | POA: Diagnosis not present

## 2022-05-10 DIAGNOSIS — M6281 Muscle weakness (generalized): Secondary | ICD-10-CM | POA: Diagnosis not present

## 2022-05-10 DIAGNOSIS — R2681 Unsteadiness on feet: Secondary | ICD-10-CM | POA: Diagnosis not present

## 2022-05-11 DIAGNOSIS — E1142 Type 2 diabetes mellitus with diabetic polyneuropathy: Secondary | ICD-10-CM | POA: Diagnosis not present

## 2022-05-11 DIAGNOSIS — M6281 Muscle weakness (generalized): Secondary | ICD-10-CM | POA: Diagnosis not present

## 2022-05-11 DIAGNOSIS — R2681 Unsteadiness on feet: Secondary | ICD-10-CM | POA: Diagnosis not present

## 2022-05-16 DIAGNOSIS — R2681 Unsteadiness on feet: Secondary | ICD-10-CM | POA: Diagnosis not present

## 2022-05-16 DIAGNOSIS — E1142 Type 2 diabetes mellitus with diabetic polyneuropathy: Secondary | ICD-10-CM | POA: Diagnosis not present

## 2022-05-16 DIAGNOSIS — M6281 Muscle weakness (generalized): Secondary | ICD-10-CM | POA: Diagnosis not present

## 2022-05-23 DIAGNOSIS — M2041 Other hammer toe(s) (acquired), right foot: Secondary | ICD-10-CM | POA: Diagnosis not present

## 2022-05-23 DIAGNOSIS — M2042 Other hammer toe(s) (acquired), left foot: Secondary | ICD-10-CM | POA: Diagnosis not present

## 2022-05-23 DIAGNOSIS — B351 Tinea unguium: Secondary | ICD-10-CM | POA: Diagnosis not present

## 2022-05-23 DIAGNOSIS — L603 Nail dystrophy: Secondary | ICD-10-CM | POA: Diagnosis not present

## 2022-05-23 DIAGNOSIS — Z7984 Long term (current) use of oral hypoglycemic drugs: Secondary | ICD-10-CM | POA: Diagnosis not present

## 2022-05-23 DIAGNOSIS — E1151 Type 2 diabetes mellitus with diabetic peripheral angiopathy without gangrene: Secondary | ICD-10-CM | POA: Diagnosis not present

## 2022-05-28 DIAGNOSIS — I10 Essential (primary) hypertension: Secondary | ICD-10-CM | POA: Diagnosis not present

## 2022-06-06 DIAGNOSIS — I509 Heart failure, unspecified: Secondary | ICD-10-CM | POA: Diagnosis not present

## 2022-06-06 DIAGNOSIS — Z7984 Long term (current) use of oral hypoglycemic drugs: Secondary | ICD-10-CM | POA: Diagnosis not present

## 2022-06-06 DIAGNOSIS — M519 Unspecified thoracic, thoracolumbar and lumbosacral intervertebral disc disorder: Secondary | ICD-10-CM | POA: Diagnosis not present

## 2022-06-06 DIAGNOSIS — R197 Diarrhea, unspecified: Secondary | ICD-10-CM | POA: Diagnosis not present

## 2022-06-06 DIAGNOSIS — Z0189 Encounter for other specified special examinations: Secondary | ICD-10-CM | POA: Diagnosis not present

## 2022-06-06 DIAGNOSIS — E1169 Type 2 diabetes mellitus with other specified complication: Secondary | ICD-10-CM | POA: Diagnosis not present

## 2022-06-06 DIAGNOSIS — G4733 Obstructive sleep apnea (adult) (pediatric): Secondary | ICD-10-CM | POA: Diagnosis not present

## 2022-06-06 DIAGNOSIS — H409 Unspecified glaucoma: Secondary | ICD-10-CM | POA: Diagnosis not present

## 2022-06-06 DIAGNOSIS — E785 Hyperlipidemia, unspecified: Secondary | ICD-10-CM | POA: Diagnosis not present

## 2022-06-06 DIAGNOSIS — E1142 Type 2 diabetes mellitus with diabetic polyneuropathy: Secondary | ICD-10-CM | POA: Diagnosis not present

## 2022-06-14 DIAGNOSIS — J302 Other seasonal allergic rhinitis: Secondary | ICD-10-CM | POA: Diagnosis not present

## 2022-06-14 DIAGNOSIS — Z8679 Personal history of other diseases of the circulatory system: Secondary | ICD-10-CM | POA: Diagnosis not present

## 2022-06-14 DIAGNOSIS — H409 Unspecified glaucoma: Secondary | ICD-10-CM | POA: Diagnosis not present

## 2022-06-14 DIAGNOSIS — R0982 Postnasal drip: Secondary | ICD-10-CM | POA: Diagnosis not present

## 2022-06-14 DIAGNOSIS — Z09 Encounter for follow-up examination after completed treatment for conditions other than malignant neoplasm: Secondary | ICD-10-CM | POA: Diagnosis not present

## 2022-06-21 DIAGNOSIS — Z7189 Other specified counseling: Secondary | ICD-10-CM | POA: Diagnosis not present

## 2022-06-21 DIAGNOSIS — I951 Orthostatic hypotension: Secondary | ICD-10-CM | POA: Diagnosis not present

## 2022-06-21 DIAGNOSIS — J302 Other seasonal allergic rhinitis: Secondary | ICD-10-CM | POA: Diagnosis not present

## 2022-06-21 DIAGNOSIS — Z7984 Long term (current) use of oral hypoglycemic drugs: Secondary | ICD-10-CM | POA: Diagnosis not present

## 2022-06-21 DIAGNOSIS — N182 Chronic kidney disease, stage 2 (mild): Secondary | ICD-10-CM | POA: Diagnosis not present

## 2022-06-21 DIAGNOSIS — E1142 Type 2 diabetes mellitus with diabetic polyneuropathy: Secondary | ICD-10-CM | POA: Diagnosis not present

## 2022-06-21 DIAGNOSIS — I129 Hypertensive chronic kidney disease with stage 1 through stage 4 chronic kidney disease, or unspecified chronic kidney disease: Secondary | ICD-10-CM | POA: Diagnosis not present

## 2022-06-21 DIAGNOSIS — E1122 Type 2 diabetes mellitus with diabetic chronic kidney disease: Secondary | ICD-10-CM | POA: Diagnosis not present

## 2022-06-22 DIAGNOSIS — E119 Type 2 diabetes mellitus without complications: Secondary | ICD-10-CM | POA: Diagnosis not present

## 2022-06-28 DIAGNOSIS — H401113 Primary open-angle glaucoma, right eye, severe stage: Secondary | ICD-10-CM | POA: Diagnosis not present

## 2022-07-02 DIAGNOSIS — R1310 Dysphagia, unspecified: Secondary | ICD-10-CM | POA: Diagnosis not present

## 2022-07-02 DIAGNOSIS — E1142 Type 2 diabetes mellitus with diabetic polyneuropathy: Secondary | ICD-10-CM | POA: Diagnosis not present

## 2022-07-03 DIAGNOSIS — R1310 Dysphagia, unspecified: Secondary | ICD-10-CM | POA: Diagnosis not present

## 2022-07-03 DIAGNOSIS — E1142 Type 2 diabetes mellitus with diabetic polyneuropathy: Secondary | ICD-10-CM | POA: Diagnosis not present

## 2022-07-04 DIAGNOSIS — R1310 Dysphagia, unspecified: Secondary | ICD-10-CM | POA: Diagnosis not present

## 2022-07-04 DIAGNOSIS — E1142 Type 2 diabetes mellitus with diabetic polyneuropathy: Secondary | ICD-10-CM | POA: Diagnosis not present

## 2022-07-05 DIAGNOSIS — E1142 Type 2 diabetes mellitus with diabetic polyneuropathy: Secondary | ICD-10-CM | POA: Diagnosis not present

## 2022-07-05 DIAGNOSIS — R1310 Dysphagia, unspecified: Secondary | ICD-10-CM | POA: Diagnosis not present

## 2022-07-06 DIAGNOSIS — R1310 Dysphagia, unspecified: Secondary | ICD-10-CM | POA: Diagnosis not present

## 2022-07-06 DIAGNOSIS — E1142 Type 2 diabetes mellitus with diabetic polyneuropathy: Secondary | ICD-10-CM | POA: Diagnosis not present

## 2022-07-09 DIAGNOSIS — R1310 Dysphagia, unspecified: Secondary | ICD-10-CM | POA: Diagnosis not present

## 2022-07-09 DIAGNOSIS — E1142 Type 2 diabetes mellitus with diabetic polyneuropathy: Secondary | ICD-10-CM | POA: Diagnosis not present

## 2022-07-11 DIAGNOSIS — E1142 Type 2 diabetes mellitus with diabetic polyneuropathy: Secondary | ICD-10-CM | POA: Diagnosis not present

## 2022-07-11 DIAGNOSIS — R1310 Dysphagia, unspecified: Secondary | ICD-10-CM | POA: Diagnosis not present

## 2022-07-12 DIAGNOSIS — E1142 Type 2 diabetes mellitus with diabetic polyneuropathy: Secondary | ICD-10-CM | POA: Diagnosis not present

## 2022-07-12 DIAGNOSIS — R1310 Dysphagia, unspecified: Secondary | ICD-10-CM | POA: Diagnosis not present

## 2022-07-13 DIAGNOSIS — E1142 Type 2 diabetes mellitus with diabetic polyneuropathy: Secondary | ICD-10-CM | POA: Diagnosis not present

## 2022-07-13 DIAGNOSIS — R1310 Dysphagia, unspecified: Secondary | ICD-10-CM | POA: Diagnosis not present

## 2022-07-16 DIAGNOSIS — R1310 Dysphagia, unspecified: Secondary | ICD-10-CM | POA: Diagnosis not present

## 2022-07-16 DIAGNOSIS — E1142 Type 2 diabetes mellitus with diabetic polyneuropathy: Secondary | ICD-10-CM | POA: Diagnosis not present

## 2022-07-17 DIAGNOSIS — E1142 Type 2 diabetes mellitus with diabetic polyneuropathy: Secondary | ICD-10-CM | POA: Diagnosis not present

## 2022-07-17 DIAGNOSIS — R1310 Dysphagia, unspecified: Secondary | ICD-10-CM | POA: Diagnosis not present

## 2022-07-18 DIAGNOSIS — R1310 Dysphagia, unspecified: Secondary | ICD-10-CM | POA: Diagnosis not present

## 2022-07-18 DIAGNOSIS — E1142 Type 2 diabetes mellitus with diabetic polyneuropathy: Secondary | ICD-10-CM | POA: Diagnosis not present

## 2022-07-19 DIAGNOSIS — R1310 Dysphagia, unspecified: Secondary | ICD-10-CM | POA: Diagnosis not present

## 2022-07-19 DIAGNOSIS — E1142 Type 2 diabetes mellitus with diabetic polyneuropathy: Secondary | ICD-10-CM | POA: Diagnosis not present

## 2022-07-20 DIAGNOSIS — E1142 Type 2 diabetes mellitus with diabetic polyneuropathy: Secondary | ICD-10-CM | POA: Diagnosis not present

## 2022-07-20 DIAGNOSIS — R1310 Dysphagia, unspecified: Secondary | ICD-10-CM | POA: Diagnosis not present

## 2022-07-23 DIAGNOSIS — E1142 Type 2 diabetes mellitus with diabetic polyneuropathy: Secondary | ICD-10-CM | POA: Diagnosis not present

## 2022-07-23 DIAGNOSIS — Z9189 Other specified personal risk factors, not elsewhere classified: Secondary | ICD-10-CM | POA: Diagnosis not present

## 2022-07-23 DIAGNOSIS — Z7984 Long term (current) use of oral hypoglycemic drugs: Secondary | ICD-10-CM | POA: Diagnosis not present

## 2022-07-23 DIAGNOSIS — N182 Chronic kidney disease, stage 2 (mild): Secondary | ICD-10-CM | POA: Diagnosis not present

## 2022-07-23 DIAGNOSIS — E1122 Type 2 diabetes mellitus with diabetic chronic kidney disease: Secondary | ICD-10-CM | POA: Diagnosis not present

## 2022-07-23 DIAGNOSIS — R1310 Dysphagia, unspecified: Secondary | ICD-10-CM | POA: Diagnosis not present

## 2022-07-23 DIAGNOSIS — Z029 Encounter for administrative examinations, unspecified: Secondary | ICD-10-CM | POA: Diagnosis not present

## 2022-07-23 DIAGNOSIS — K219 Gastro-esophageal reflux disease without esophagitis: Secondary | ICD-10-CM | POA: Diagnosis not present

## 2022-07-24 DIAGNOSIS — R1310 Dysphagia, unspecified: Secondary | ICD-10-CM | POA: Diagnosis not present

## 2022-07-24 DIAGNOSIS — E1142 Type 2 diabetes mellitus with diabetic polyneuropathy: Secondary | ICD-10-CM | POA: Diagnosis not present

## 2022-07-25 DIAGNOSIS — E1142 Type 2 diabetes mellitus with diabetic polyneuropathy: Secondary | ICD-10-CM | POA: Diagnosis not present

## 2022-07-25 DIAGNOSIS — R1310 Dysphagia, unspecified: Secondary | ICD-10-CM | POA: Diagnosis not present

## 2022-07-27 DIAGNOSIS — E1142 Type 2 diabetes mellitus with diabetic polyneuropathy: Secondary | ICD-10-CM | POA: Diagnosis not present

## 2022-07-27 DIAGNOSIS — R1310 Dysphagia, unspecified: Secondary | ICD-10-CM | POA: Diagnosis not present

## 2022-07-30 DIAGNOSIS — R1310 Dysphagia, unspecified: Secondary | ICD-10-CM | POA: Diagnosis not present

## 2022-07-30 DIAGNOSIS — E1142 Type 2 diabetes mellitus with diabetic polyneuropathy: Secondary | ICD-10-CM | POA: Diagnosis not present

## 2022-07-30 IMAGING — MG MM DIGITAL SCREENING BILAT W/ TOMO AND CAD
8 series · 9 of 24 positions shown · non-contrast
Comparison: Previous exam(s).

CLINICAL DATA: Screening.

EXAM:
DIGITAL SCREENING BILATERAL MAMMOGRAM WITH TOMOSYNTHESIS AND CAD
TECHNIQUE: Bilateral screening digital craniocaudal and mediolateral oblique
mammograms were obtained. Bilateral screening digital breast
tomosynthesis was performed. The images were evaluated with
computer-aided detection.

[R CC synth-2D]
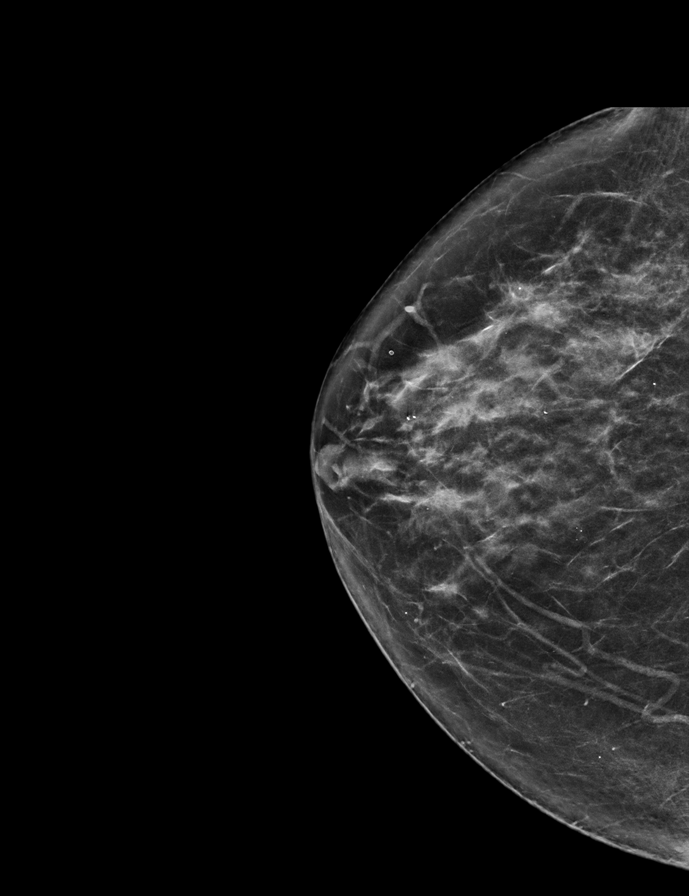

[L CC synth-2D]
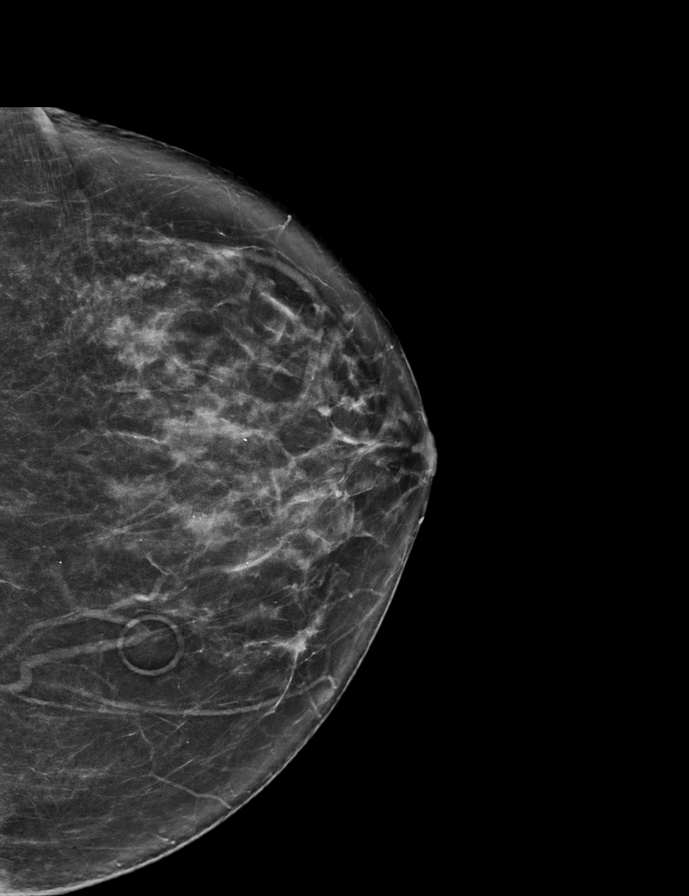

[R MLO synth-2D]
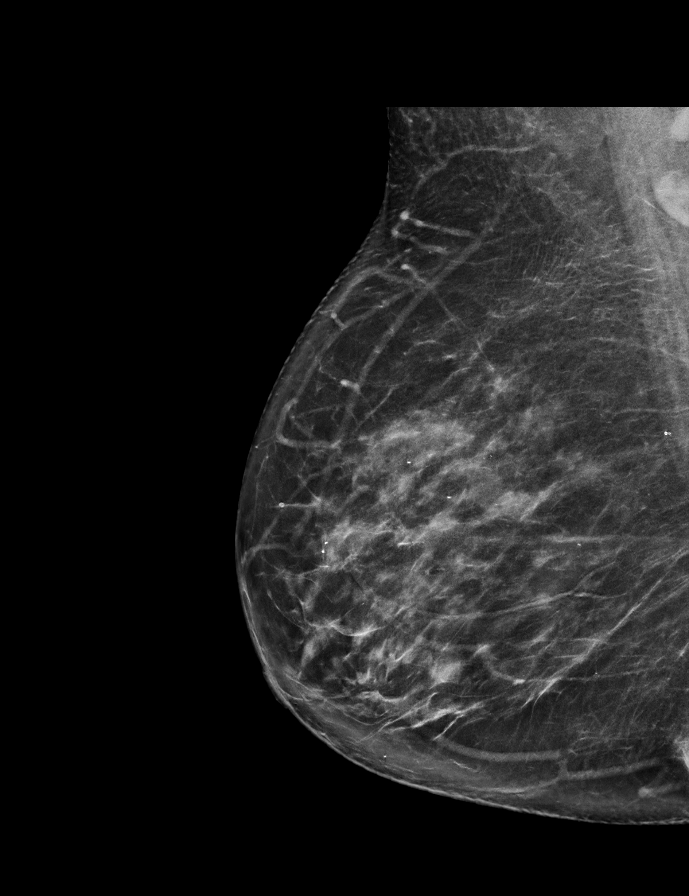

[L MLO synth-2D]
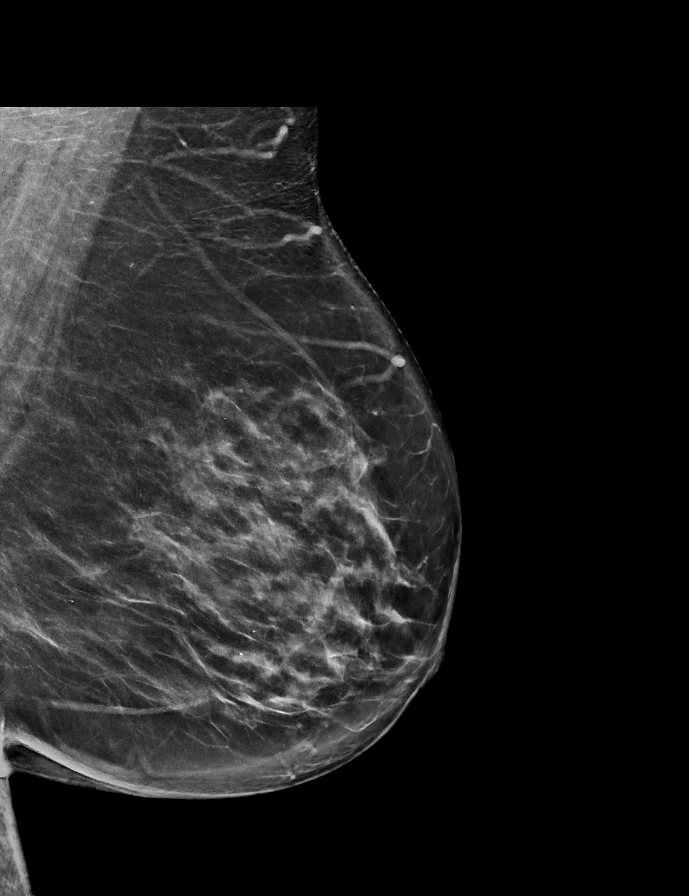

[L MLO tomo · 2 of 82 frames shown]
[frame 27/82]
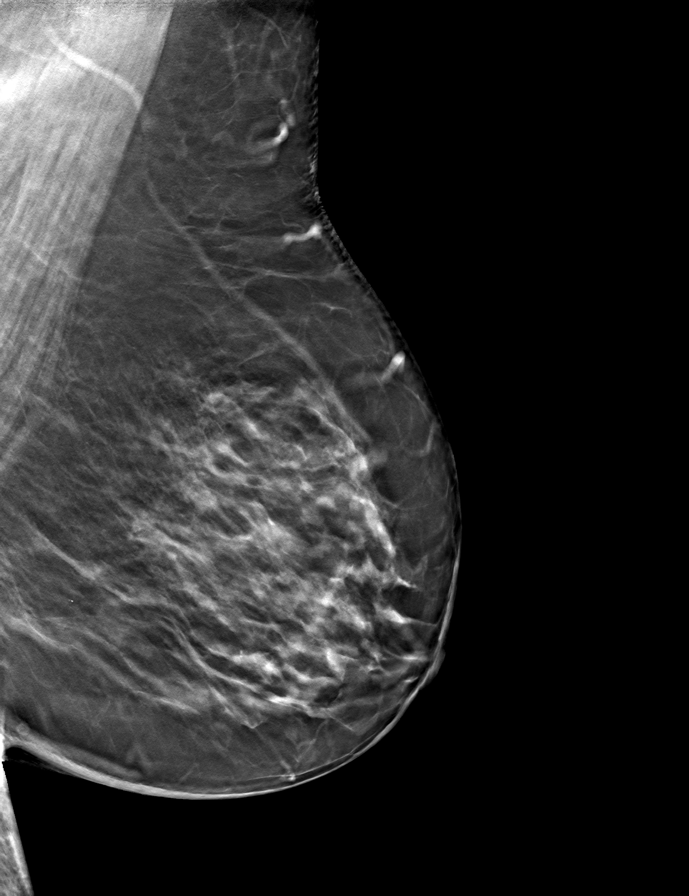
[frame 41/82]
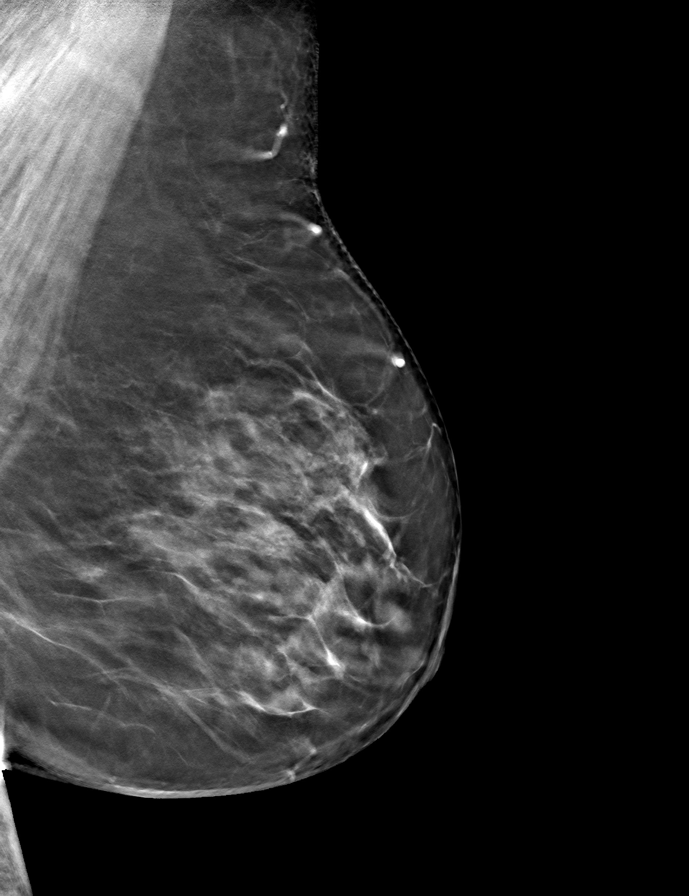

[R MLO tomo · tomo slice 38/75.0]
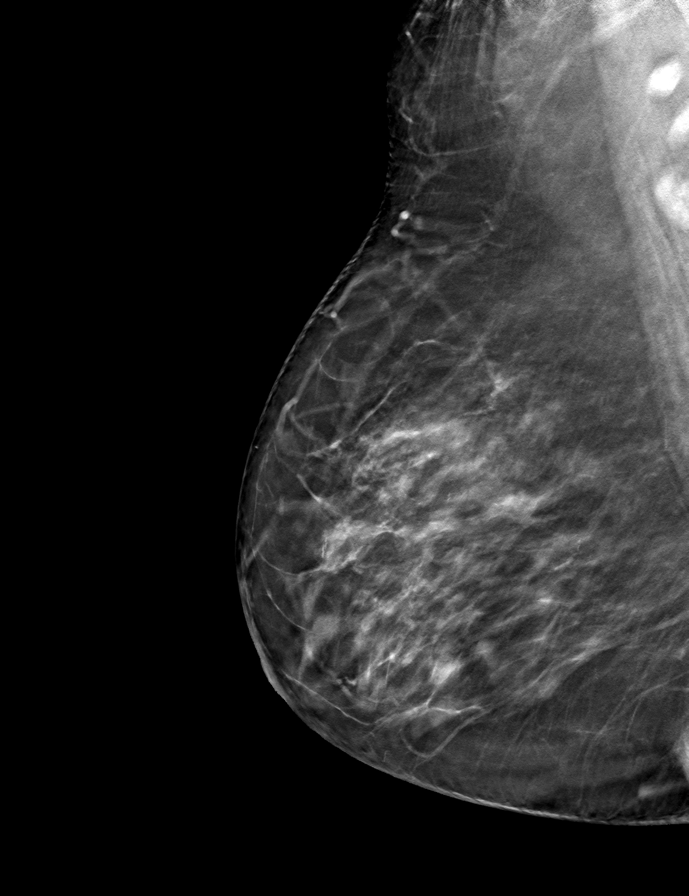

[R CC tomo · tomo slice 33/66.0]
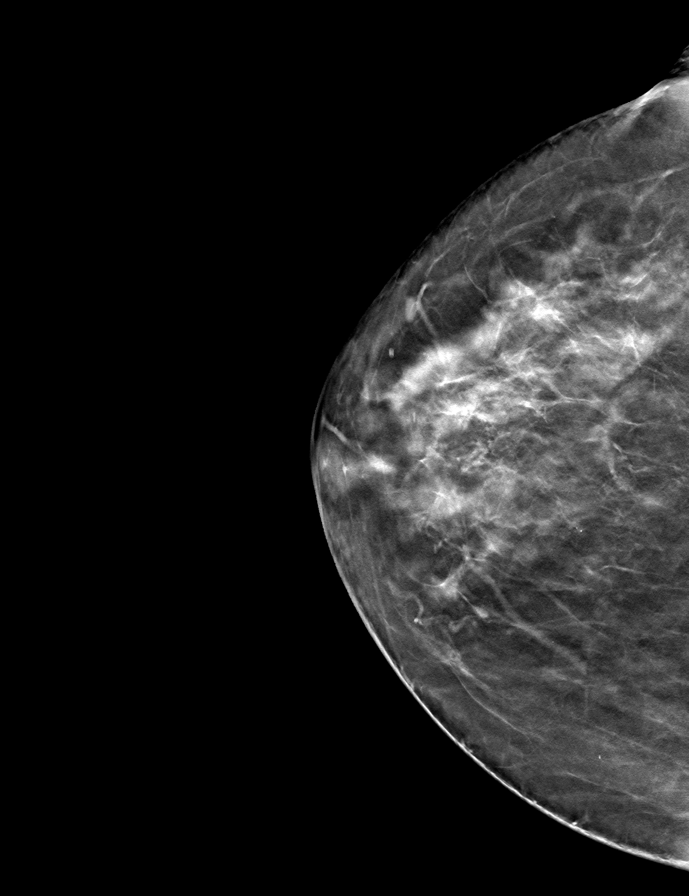

[L CC tomo · tomo slice 33/66.0]
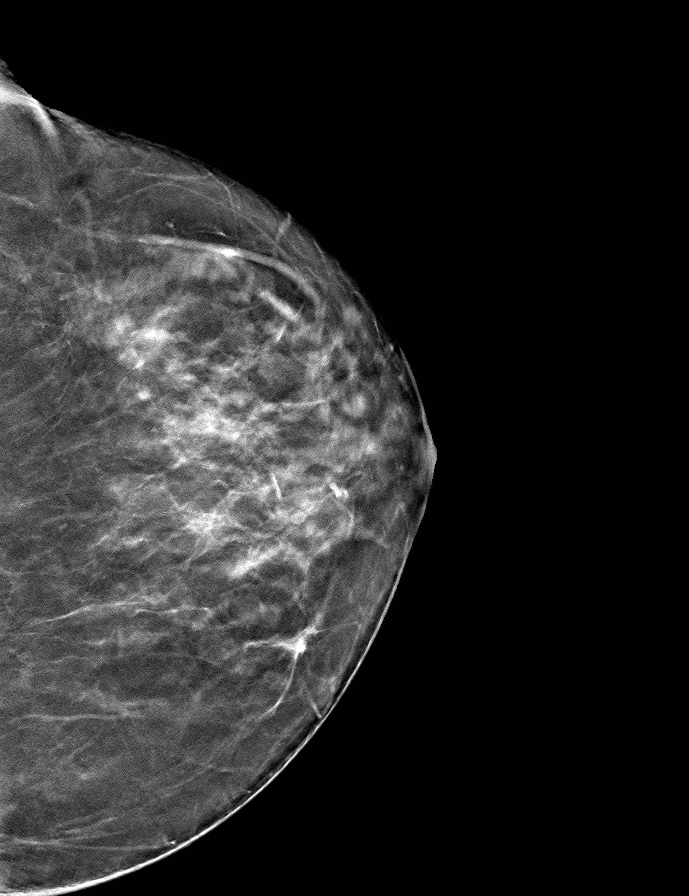

[9 of 24 positions shown; findings below may reference images not displayed]

ACR Breast Density Category c: The breast tissue is heterogeneously
dense, which may obscure small masses.
FINDINGS: There are no findings suspicious for malignancy. The images were
evaluated with computer-aided detection.
IMPRESSION: No mammographic evidence of malignancy. A result letter of this
screening mammogram will be mailed directly to the patient.

RECOMMENDATION:
Screening mammogram in one year. (Code:T4-5-GWO)

BI-RADS CATEGORY  1: Negative.

## 2022-07-31 DIAGNOSIS — I10 Essential (primary) hypertension: Secondary | ICD-10-CM | POA: Diagnosis not present

## 2022-07-31 DIAGNOSIS — Z9989 Dependence on other enabling machines and devices: Secondary | ICD-10-CM | POA: Diagnosis not present

## 2022-07-31 DIAGNOSIS — G4733 Obstructive sleep apnea (adult) (pediatric): Secondary | ICD-10-CM | POA: Diagnosis not present

## 2022-07-31 DIAGNOSIS — E785 Hyperlipidemia, unspecified: Secondary | ICD-10-CM | POA: Diagnosis not present

## 2022-08-02 DIAGNOSIS — I10 Essential (primary) hypertension: Secondary | ICD-10-CM | POA: Diagnosis not present

## 2022-08-03 DIAGNOSIS — I10 Essential (primary) hypertension: Secondary | ICD-10-CM | POA: Diagnosis not present

## 2022-08-06 DIAGNOSIS — E785 Hyperlipidemia, unspecified: Secondary | ICD-10-CM | POA: Diagnosis not present

## 2022-08-06 DIAGNOSIS — E538 Deficiency of other specified B group vitamins: Secondary | ICD-10-CM | POA: Diagnosis not present

## 2022-08-06 DIAGNOSIS — I129 Hypertensive chronic kidney disease with stage 1 through stage 4 chronic kidney disease, or unspecified chronic kidney disease: Secondary | ICD-10-CM | POA: Diagnosis not present

## 2022-08-06 DIAGNOSIS — N182 Chronic kidney disease, stage 2 (mild): Secondary | ICD-10-CM | POA: Diagnosis not present

## 2022-08-13 DIAGNOSIS — J302 Other seasonal allergic rhinitis: Secondary | ICD-10-CM | POA: Diagnosis not present

## 2022-08-13 DIAGNOSIS — U071 COVID-19: Secondary | ICD-10-CM | POA: Diagnosis not present

## 2022-08-13 DIAGNOSIS — N182 Chronic kidney disease, stage 2 (mild): Secondary | ICD-10-CM | POA: Diagnosis not present

## 2022-08-23 DIAGNOSIS — L603 Nail dystrophy: Secondary | ICD-10-CM | POA: Diagnosis not present

## 2022-08-23 DIAGNOSIS — E1151 Type 2 diabetes mellitus with diabetic peripheral angiopathy without gangrene: Secondary | ICD-10-CM | POA: Diagnosis not present

## 2022-09-20 DIAGNOSIS — M810 Age-related osteoporosis without current pathological fracture: Secondary | ICD-10-CM | POA: Diagnosis not present

## 2022-09-20 DIAGNOSIS — M519 Unspecified thoracic, thoracolumbar and lumbosacral intervertebral disc disorder: Secondary | ICD-10-CM | POA: Diagnosis not present

## 2022-09-20 DIAGNOSIS — I10 Essential (primary) hypertension: Secondary | ICD-10-CM | POA: Diagnosis not present

## 2022-09-20 DIAGNOSIS — M1990 Unspecified osteoarthritis, unspecified site: Secondary | ICD-10-CM | POA: Diagnosis not present

## 2022-09-21 DIAGNOSIS — E119 Type 2 diabetes mellitus without complications: Secondary | ICD-10-CM | POA: Diagnosis not present

## 2022-09-24 DIAGNOSIS — E1142 Type 2 diabetes mellitus with diabetic polyneuropathy: Secondary | ICD-10-CM | POA: Diagnosis not present

## 2022-09-24 DIAGNOSIS — Z0189 Encounter for other specified special examinations: Secondary | ICD-10-CM | POA: Diagnosis not present

## 2022-09-24 DIAGNOSIS — M1990 Unspecified osteoarthritis, unspecified site: Secondary | ICD-10-CM | POA: Diagnosis not present

## 2022-09-24 DIAGNOSIS — Z7984 Long term (current) use of oral hypoglycemic drugs: Secondary | ICD-10-CM | POA: Diagnosis not present

## 2022-09-26 DIAGNOSIS — M6281 Muscle weakness (generalized): Secondary | ICD-10-CM | POA: Diagnosis not present

## 2022-09-27 DIAGNOSIS — M6281 Muscle weakness (generalized): Secondary | ICD-10-CM | POA: Diagnosis not present

## 2022-09-28 DIAGNOSIS — M6281 Muscle weakness (generalized): Secondary | ICD-10-CM | POA: Diagnosis not present

## 2022-10-01 DIAGNOSIS — E1142 Type 2 diabetes mellitus with diabetic polyneuropathy: Secondary | ICD-10-CM | POA: Diagnosis not present

## 2022-10-01 DIAGNOSIS — R1310 Dysphagia, unspecified: Secondary | ICD-10-CM | POA: Diagnosis not present

## 2022-10-01 DIAGNOSIS — M6281 Muscle weakness (generalized): Secondary | ICD-10-CM | POA: Diagnosis not present

## 2022-10-02 DIAGNOSIS — M6281 Muscle weakness (generalized): Secondary | ICD-10-CM | POA: Diagnosis not present

## 2022-10-02 DIAGNOSIS — E1142 Type 2 diabetes mellitus with diabetic polyneuropathy: Secondary | ICD-10-CM | POA: Diagnosis not present

## 2022-10-02 DIAGNOSIS — R1310 Dysphagia, unspecified: Secondary | ICD-10-CM | POA: Diagnosis not present

## 2022-10-03 DIAGNOSIS — M19032 Primary osteoarthritis, left wrist: Secondary | ICD-10-CM | POA: Diagnosis not present

## 2022-10-03 DIAGNOSIS — E1122 Type 2 diabetes mellitus with diabetic chronic kidney disease: Secondary | ICD-10-CM | POA: Diagnosis not present

## 2022-10-03 DIAGNOSIS — E1142 Type 2 diabetes mellitus with diabetic polyneuropathy: Secondary | ICD-10-CM | POA: Diagnosis not present

## 2022-10-03 DIAGNOSIS — M19031 Primary osteoarthritis, right wrist: Secondary | ICD-10-CM | POA: Diagnosis not present

## 2022-10-03 DIAGNOSIS — Z7984 Long term (current) use of oral hypoglycemic drugs: Secondary | ICD-10-CM | POA: Diagnosis not present

## 2022-10-03 DIAGNOSIS — M519 Unspecified thoracic, thoracolumbar and lumbosacral intervertebral disc disorder: Secondary | ICD-10-CM | POA: Diagnosis not present

## 2022-10-03 DIAGNOSIS — R1310 Dysphagia, unspecified: Secondary | ICD-10-CM | POA: Diagnosis not present

## 2022-10-03 DIAGNOSIS — N183 Chronic kidney disease, stage 3 unspecified: Secondary | ICD-10-CM | POA: Diagnosis not present

## 2022-10-03 DIAGNOSIS — M6281 Muscle weakness (generalized): Secondary | ICD-10-CM | POA: Diagnosis not present

## 2022-10-03 DIAGNOSIS — M19011 Primary osteoarthritis, right shoulder: Secondary | ICD-10-CM | POA: Diagnosis not present

## 2022-10-03 DIAGNOSIS — M19012 Primary osteoarthritis, left shoulder: Secondary | ICD-10-CM | POA: Diagnosis not present

## 2022-10-04 DIAGNOSIS — R1310 Dysphagia, unspecified: Secondary | ICD-10-CM | POA: Diagnosis not present

## 2022-10-04 DIAGNOSIS — M6281 Muscle weakness (generalized): Secondary | ICD-10-CM | POA: Diagnosis not present

## 2022-10-04 DIAGNOSIS — E1142 Type 2 diabetes mellitus with diabetic polyneuropathy: Secondary | ICD-10-CM | POA: Diagnosis not present

## 2022-10-05 DIAGNOSIS — E1142 Type 2 diabetes mellitus with diabetic polyneuropathy: Secondary | ICD-10-CM | POA: Diagnosis not present

## 2022-10-05 DIAGNOSIS — M6281 Muscle weakness (generalized): Secondary | ICD-10-CM | POA: Diagnosis not present

## 2022-10-05 DIAGNOSIS — R1310 Dysphagia, unspecified: Secondary | ICD-10-CM | POA: Diagnosis not present

## 2022-10-08 DIAGNOSIS — M519 Unspecified thoracic, thoracolumbar and lumbosacral intervertebral disc disorder: Secondary | ICD-10-CM | POA: Diagnosis not present

## 2022-10-08 DIAGNOSIS — M19012 Primary osteoarthritis, left shoulder: Secondary | ICD-10-CM | POA: Diagnosis not present

## 2022-10-08 DIAGNOSIS — R1310 Dysphagia, unspecified: Secondary | ICD-10-CM | POA: Diagnosis not present

## 2022-10-08 DIAGNOSIS — R42 Dizziness and giddiness: Secondary | ICD-10-CM | POA: Diagnosis not present

## 2022-10-08 DIAGNOSIS — M19031 Primary osteoarthritis, right wrist: Secondary | ICD-10-CM | POA: Diagnosis not present

## 2022-10-08 DIAGNOSIS — M19032 Primary osteoarthritis, left wrist: Secondary | ICD-10-CM | POA: Diagnosis not present

## 2022-10-08 DIAGNOSIS — M6281 Muscle weakness (generalized): Secondary | ICD-10-CM | POA: Diagnosis not present

## 2022-10-08 DIAGNOSIS — E1142 Type 2 diabetes mellitus with diabetic polyneuropathy: Secondary | ICD-10-CM | POA: Diagnosis not present

## 2022-10-08 DIAGNOSIS — M19011 Primary osteoarthritis, right shoulder: Secondary | ICD-10-CM | POA: Diagnosis not present

## 2022-10-09 DIAGNOSIS — E1142 Type 2 diabetes mellitus with diabetic polyneuropathy: Secondary | ICD-10-CM | POA: Diagnosis not present

## 2022-10-09 DIAGNOSIS — M6281 Muscle weakness (generalized): Secondary | ICD-10-CM | POA: Diagnosis not present

## 2022-10-09 DIAGNOSIS — R1310 Dysphagia, unspecified: Secondary | ICD-10-CM | POA: Diagnosis not present

## 2022-10-10 DIAGNOSIS — E1142 Type 2 diabetes mellitus with diabetic polyneuropathy: Secondary | ICD-10-CM | POA: Diagnosis not present

## 2022-10-10 DIAGNOSIS — M6281 Muscle weakness (generalized): Secondary | ICD-10-CM | POA: Diagnosis not present

## 2022-10-10 DIAGNOSIS — R1310 Dysphagia, unspecified: Secondary | ICD-10-CM | POA: Diagnosis not present

## 2022-10-11 DIAGNOSIS — N3281 Overactive bladder: Secondary | ICD-10-CM | POA: Diagnosis not present

## 2022-10-11 DIAGNOSIS — M150 Primary generalized (osteo)arthritis: Secondary | ICD-10-CM | POA: Diagnosis not present

## 2022-10-11 DIAGNOSIS — N183 Chronic kidney disease, stage 3 unspecified: Secondary | ICD-10-CM | POA: Diagnosis not present

## 2022-10-11 DIAGNOSIS — M810 Age-related osteoporosis without current pathological fracture: Secondary | ICD-10-CM | POA: Diagnosis not present

## 2022-10-11 DIAGNOSIS — Z9189 Other specified personal risk factors, not elsewhere classified: Secondary | ICD-10-CM | POA: Diagnosis not present

## 2022-10-11 DIAGNOSIS — G4733 Obstructive sleep apnea (adult) (pediatric): Secondary | ICD-10-CM | POA: Diagnosis not present

## 2022-10-11 DIAGNOSIS — E1122 Type 2 diabetes mellitus with diabetic chronic kidney disease: Secondary | ICD-10-CM | POA: Diagnosis not present

## 2022-10-11 DIAGNOSIS — K760 Fatty (change of) liver, not elsewhere classified: Secondary | ICD-10-CM | POA: Diagnosis not present

## 2022-10-11 DIAGNOSIS — R1310 Dysphagia, unspecified: Secondary | ICD-10-CM | POA: Diagnosis not present

## 2022-10-11 DIAGNOSIS — E538 Deficiency of other specified B group vitamins: Secondary | ICD-10-CM | POA: Diagnosis not present

## 2022-10-11 DIAGNOSIS — K219 Gastro-esophageal reflux disease without esophagitis: Secondary | ICD-10-CM | POA: Diagnosis not present

## 2022-10-11 DIAGNOSIS — M519 Unspecified thoracic, thoracolumbar and lumbosacral intervertebral disc disorder: Secondary | ICD-10-CM | POA: Diagnosis not present

## 2022-10-11 DIAGNOSIS — M6281 Muscle weakness (generalized): Secondary | ICD-10-CM | POA: Diagnosis not present

## 2022-10-11 DIAGNOSIS — I10 Essential (primary) hypertension: Secondary | ICD-10-CM | POA: Diagnosis not present

## 2022-10-11 DIAGNOSIS — Z7984 Long term (current) use of oral hypoglycemic drugs: Secondary | ICD-10-CM | POA: Diagnosis not present

## 2022-10-11 DIAGNOSIS — E1142 Type 2 diabetes mellitus with diabetic polyneuropathy: Secondary | ICD-10-CM | POA: Diagnosis not present

## 2022-10-11 DIAGNOSIS — E785 Hyperlipidemia, unspecified: Secondary | ICD-10-CM | POA: Diagnosis not present

## 2022-10-11 DIAGNOSIS — R413 Other amnesia: Secondary | ICD-10-CM | POA: Diagnosis not present

## 2022-10-11 DIAGNOSIS — H409 Unspecified glaucoma: Secondary | ICD-10-CM | POA: Diagnosis not present

## 2022-10-11 DIAGNOSIS — R0982 Postnasal drip: Secondary | ICD-10-CM | POA: Diagnosis not present

## 2022-10-11 DIAGNOSIS — Z9989 Dependence on other enabling machines and devices: Secondary | ICD-10-CM | POA: Diagnosis not present

## 2022-10-12 DIAGNOSIS — M6281 Muscle weakness (generalized): Secondary | ICD-10-CM | POA: Diagnosis not present

## 2022-10-12 DIAGNOSIS — R1310 Dysphagia, unspecified: Secondary | ICD-10-CM | POA: Diagnosis not present

## 2022-10-12 DIAGNOSIS — E1142 Type 2 diabetes mellitus with diabetic polyneuropathy: Secondary | ICD-10-CM | POA: Diagnosis not present

## 2022-10-13 DIAGNOSIS — M6281 Muscle weakness (generalized): Secondary | ICD-10-CM | POA: Diagnosis not present

## 2022-10-13 DIAGNOSIS — E1142 Type 2 diabetes mellitus with diabetic polyneuropathy: Secondary | ICD-10-CM | POA: Diagnosis not present

## 2022-10-13 DIAGNOSIS — I10 Essential (primary) hypertension: Secondary | ICD-10-CM | POA: Diagnosis not present

## 2022-10-13 DIAGNOSIS — R1310 Dysphagia, unspecified: Secondary | ICD-10-CM | POA: Diagnosis not present

## 2022-10-13 DIAGNOSIS — E559 Vitamin D deficiency, unspecified: Secondary | ICD-10-CM | POA: Diagnosis not present

## 2022-10-15 DIAGNOSIS — M6281 Muscle weakness (generalized): Secondary | ICD-10-CM | POA: Diagnosis not present

## 2022-10-15 DIAGNOSIS — E1142 Type 2 diabetes mellitus with diabetic polyneuropathy: Secondary | ICD-10-CM | POA: Diagnosis not present

## 2022-10-15 DIAGNOSIS — R1310 Dysphagia, unspecified: Secondary | ICD-10-CM | POA: Diagnosis not present

## 2022-10-16 DIAGNOSIS — R1310 Dysphagia, unspecified: Secondary | ICD-10-CM | POA: Diagnosis not present

## 2022-10-16 DIAGNOSIS — E1142 Type 2 diabetes mellitus with diabetic polyneuropathy: Secondary | ICD-10-CM | POA: Diagnosis not present

## 2022-10-16 DIAGNOSIS — M6281 Muscle weakness (generalized): Secondary | ICD-10-CM | POA: Diagnosis not present

## 2022-10-17 DIAGNOSIS — M6281 Muscle weakness (generalized): Secondary | ICD-10-CM | POA: Diagnosis not present

## 2022-10-17 DIAGNOSIS — E1142 Type 2 diabetes mellitus with diabetic polyneuropathy: Secondary | ICD-10-CM | POA: Diagnosis not present

## 2022-10-17 DIAGNOSIS — R1310 Dysphagia, unspecified: Secondary | ICD-10-CM | POA: Diagnosis not present

## 2022-10-18 DIAGNOSIS — M6281 Muscle weakness (generalized): Secondary | ICD-10-CM | POA: Diagnosis not present

## 2022-10-18 DIAGNOSIS — E1142 Type 2 diabetes mellitus with diabetic polyneuropathy: Secondary | ICD-10-CM | POA: Diagnosis not present

## 2022-10-18 DIAGNOSIS — R1310 Dysphagia, unspecified: Secondary | ICD-10-CM | POA: Diagnosis not present

## 2022-10-19 DIAGNOSIS — M6281 Muscle weakness (generalized): Secondary | ICD-10-CM | POA: Diagnosis not present

## 2022-10-19 DIAGNOSIS — E1142 Type 2 diabetes mellitus with diabetic polyneuropathy: Secondary | ICD-10-CM | POA: Diagnosis not present

## 2022-10-19 DIAGNOSIS — R1310 Dysphagia, unspecified: Secondary | ICD-10-CM | POA: Diagnosis not present

## 2022-10-22 DIAGNOSIS — E1142 Type 2 diabetes mellitus with diabetic polyneuropathy: Secondary | ICD-10-CM | POA: Diagnosis not present

## 2022-10-22 DIAGNOSIS — R1310 Dysphagia, unspecified: Secondary | ICD-10-CM | POA: Diagnosis not present

## 2022-10-22 DIAGNOSIS — M6281 Muscle weakness (generalized): Secondary | ICD-10-CM | POA: Diagnosis not present

## 2022-10-23 DIAGNOSIS — E1142 Type 2 diabetes mellitus with diabetic polyneuropathy: Secondary | ICD-10-CM | POA: Diagnosis not present

## 2022-10-23 DIAGNOSIS — M6281 Muscle weakness (generalized): Secondary | ICD-10-CM | POA: Diagnosis not present

## 2022-10-23 DIAGNOSIS — R1310 Dysphagia, unspecified: Secondary | ICD-10-CM | POA: Diagnosis not present

## 2022-10-24 DIAGNOSIS — E1142 Type 2 diabetes mellitus with diabetic polyneuropathy: Secondary | ICD-10-CM | POA: Diagnosis not present

## 2022-10-24 DIAGNOSIS — R1310 Dysphagia, unspecified: Secondary | ICD-10-CM | POA: Diagnosis not present

## 2022-10-24 DIAGNOSIS — M6281 Muscle weakness (generalized): Secondary | ICD-10-CM | POA: Diagnosis not present

## 2022-10-25 DIAGNOSIS — R1310 Dysphagia, unspecified: Secondary | ICD-10-CM | POA: Diagnosis not present

## 2022-10-25 DIAGNOSIS — M6281 Muscle weakness (generalized): Secondary | ICD-10-CM | POA: Diagnosis not present

## 2022-10-25 DIAGNOSIS — E1142 Type 2 diabetes mellitus with diabetic polyneuropathy: Secondary | ICD-10-CM | POA: Diagnosis not present

## 2022-10-26 DIAGNOSIS — M6281 Muscle weakness (generalized): Secondary | ICD-10-CM | POA: Diagnosis not present

## 2022-10-26 DIAGNOSIS — E1142 Type 2 diabetes mellitus with diabetic polyneuropathy: Secondary | ICD-10-CM | POA: Diagnosis not present

## 2022-10-26 DIAGNOSIS — R1310 Dysphagia, unspecified: Secondary | ICD-10-CM | POA: Diagnosis not present

## 2022-10-29 DIAGNOSIS — E1142 Type 2 diabetes mellitus with diabetic polyneuropathy: Secondary | ICD-10-CM | POA: Diagnosis not present

## 2022-10-29 DIAGNOSIS — R1310 Dysphagia, unspecified: Secondary | ICD-10-CM | POA: Diagnosis not present

## 2022-10-29 DIAGNOSIS — M6281 Muscle weakness (generalized): Secondary | ICD-10-CM | POA: Diagnosis not present

## 2022-10-30 DIAGNOSIS — R1310 Dysphagia, unspecified: Secondary | ICD-10-CM | POA: Diagnosis not present

## 2022-10-30 DIAGNOSIS — E1142 Type 2 diabetes mellitus with diabetic polyneuropathy: Secondary | ICD-10-CM | POA: Diagnosis not present

## 2022-10-30 DIAGNOSIS — M6281 Muscle weakness (generalized): Secondary | ICD-10-CM | POA: Diagnosis not present

## 2022-10-31 DIAGNOSIS — M6281 Muscle weakness (generalized): Secondary | ICD-10-CM | POA: Diagnosis not present

## 2022-10-31 DIAGNOSIS — R1310 Dysphagia, unspecified: Secondary | ICD-10-CM | POA: Diagnosis not present

## 2022-10-31 DIAGNOSIS — E1142 Type 2 diabetes mellitus with diabetic polyneuropathy: Secondary | ICD-10-CM | POA: Diagnosis not present

## 2022-11-01 DIAGNOSIS — R1310 Dysphagia, unspecified: Secondary | ICD-10-CM | POA: Diagnosis not present

## 2022-11-01 DIAGNOSIS — M6281 Muscle weakness (generalized): Secondary | ICD-10-CM | POA: Diagnosis not present

## 2022-11-01 DIAGNOSIS — E1142 Type 2 diabetes mellitus with diabetic polyneuropathy: Secondary | ICD-10-CM | POA: Diagnosis not present

## 2022-11-02 DIAGNOSIS — M6281 Muscle weakness (generalized): Secondary | ICD-10-CM | POA: Diagnosis not present

## 2022-11-02 DIAGNOSIS — E1142 Type 2 diabetes mellitus with diabetic polyneuropathy: Secondary | ICD-10-CM | POA: Diagnosis not present

## 2022-11-02 DIAGNOSIS — R1310 Dysphagia, unspecified: Secondary | ICD-10-CM | POA: Diagnosis not present

## 2022-11-05 DIAGNOSIS — E1142 Type 2 diabetes mellitus with diabetic polyneuropathy: Secondary | ICD-10-CM | POA: Diagnosis not present

## 2022-11-05 DIAGNOSIS — R1310 Dysphagia, unspecified: Secondary | ICD-10-CM | POA: Diagnosis not present

## 2022-11-05 DIAGNOSIS — M6281 Muscle weakness (generalized): Secondary | ICD-10-CM | POA: Diagnosis not present

## 2022-11-06 DIAGNOSIS — R1310 Dysphagia, unspecified: Secondary | ICD-10-CM | POA: Diagnosis not present

## 2022-11-06 DIAGNOSIS — E1142 Type 2 diabetes mellitus with diabetic polyneuropathy: Secondary | ICD-10-CM | POA: Diagnosis not present

## 2022-11-06 DIAGNOSIS — M6281 Muscle weakness (generalized): Secondary | ICD-10-CM | POA: Diagnosis not present

## 2022-11-07 DIAGNOSIS — E1142 Type 2 diabetes mellitus with diabetic polyneuropathy: Secondary | ICD-10-CM | POA: Diagnosis not present

## 2022-11-07 DIAGNOSIS — M6281 Muscle weakness (generalized): Secondary | ICD-10-CM | POA: Diagnosis not present

## 2022-11-07 DIAGNOSIS — R1310 Dysphagia, unspecified: Secondary | ICD-10-CM | POA: Diagnosis not present

## 2022-11-08 DIAGNOSIS — R1310 Dysphagia, unspecified: Secondary | ICD-10-CM | POA: Diagnosis not present

## 2022-11-08 DIAGNOSIS — E1142 Type 2 diabetes mellitus with diabetic polyneuropathy: Secondary | ICD-10-CM | POA: Diagnosis not present

## 2022-11-08 DIAGNOSIS — M6281 Muscle weakness (generalized): Secondary | ICD-10-CM | POA: Diagnosis not present

## 2022-11-09 DIAGNOSIS — M6281 Muscle weakness (generalized): Secondary | ICD-10-CM | POA: Diagnosis not present

## 2022-11-09 DIAGNOSIS — E1142 Type 2 diabetes mellitus with diabetic polyneuropathy: Secondary | ICD-10-CM | POA: Diagnosis not present

## 2022-11-09 DIAGNOSIS — R1310 Dysphagia, unspecified: Secondary | ICD-10-CM | POA: Diagnosis not present

## 2022-11-18 IMAGING — US US ABDOMEN COMPLETE
1 series · 13 of 25 positions shown · non-contrast
Comparison: CT abdomen pelvis dated 05/10/2018.

CLINICAL DATA: 74-year-old female with generalized abdominal pain.

EXAM:
ABDOMEN ULTRASOUND COMPLETE

[Series 1: us abdomen complete · 0.23mm/px · 13 of 105 slices shown]
[im 1/105]
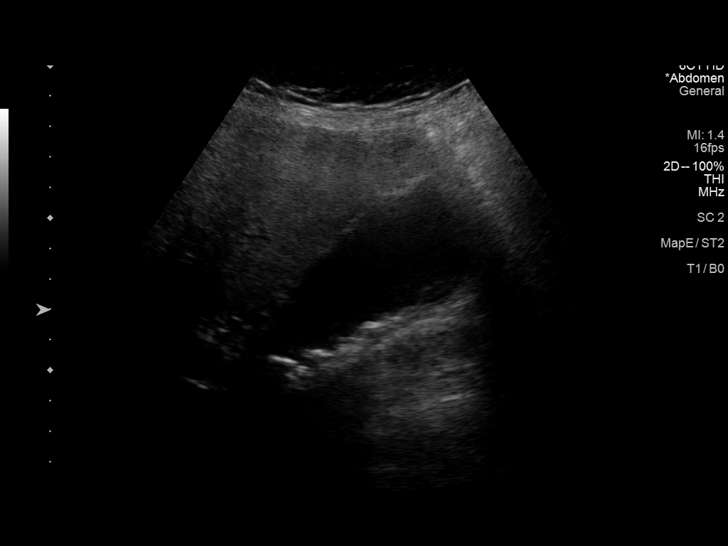
[im 9/105]
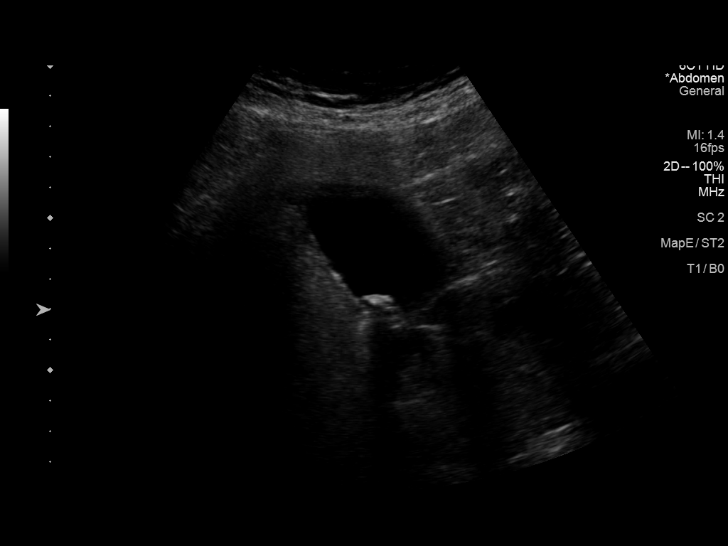
[im 18/105]
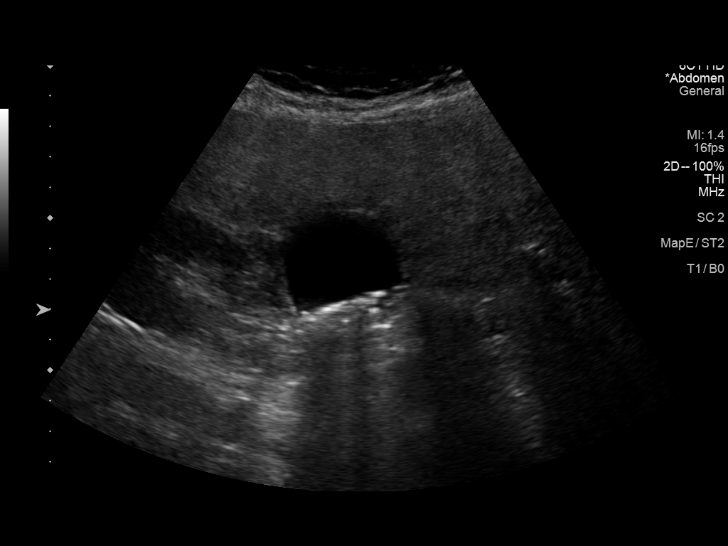
[im 27/105]
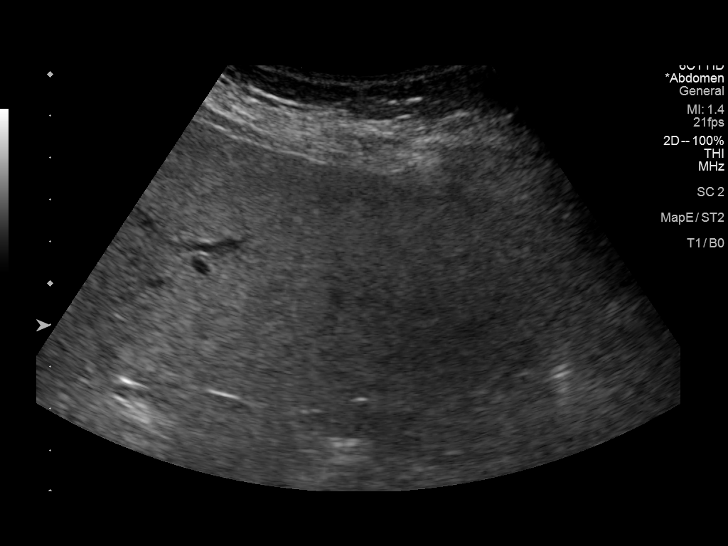
[im 35/105]
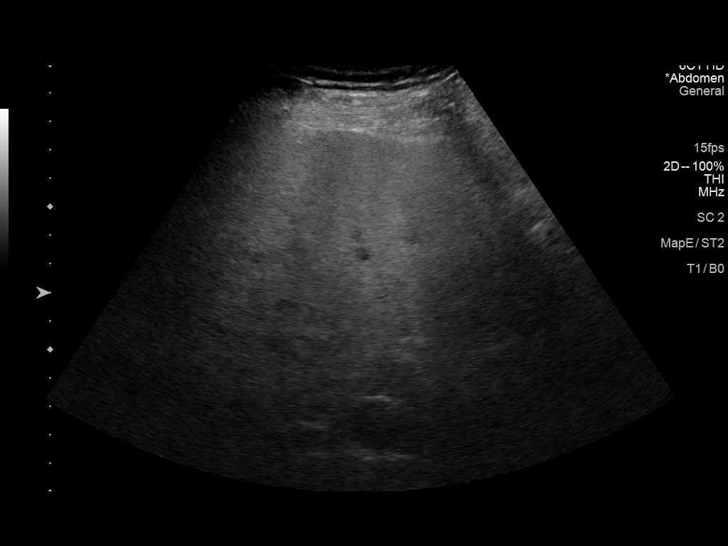
[im 44/105]
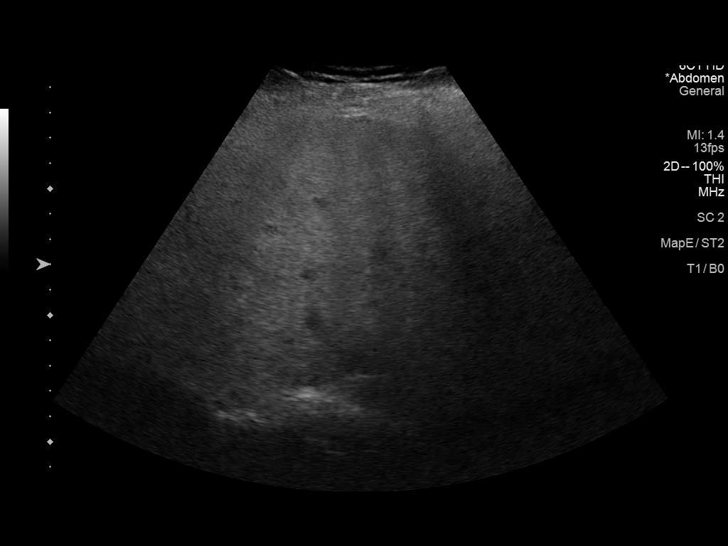
[im 53/105]
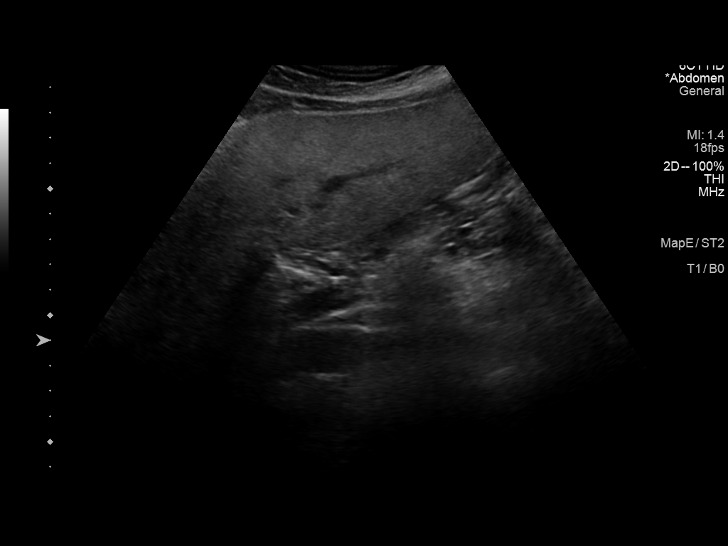
[im 61/105]
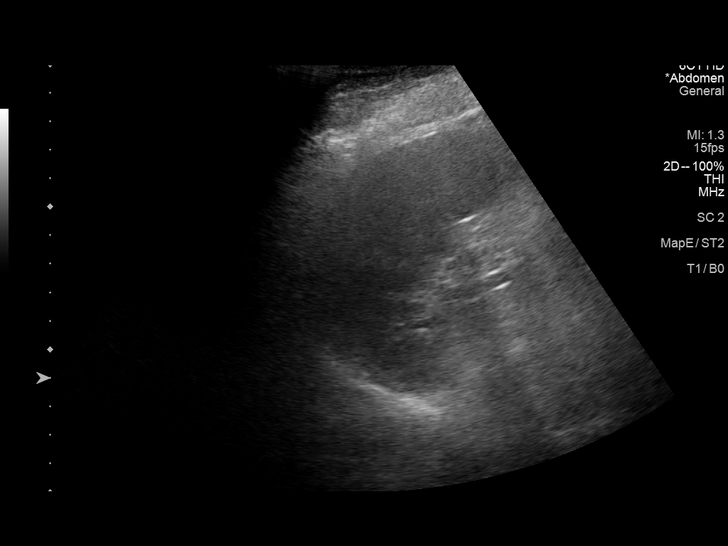
[im 70/105]
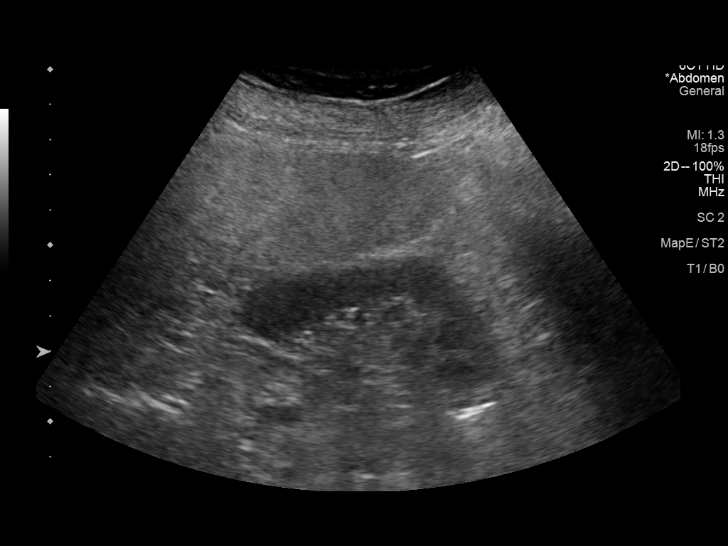
[im 79/105]
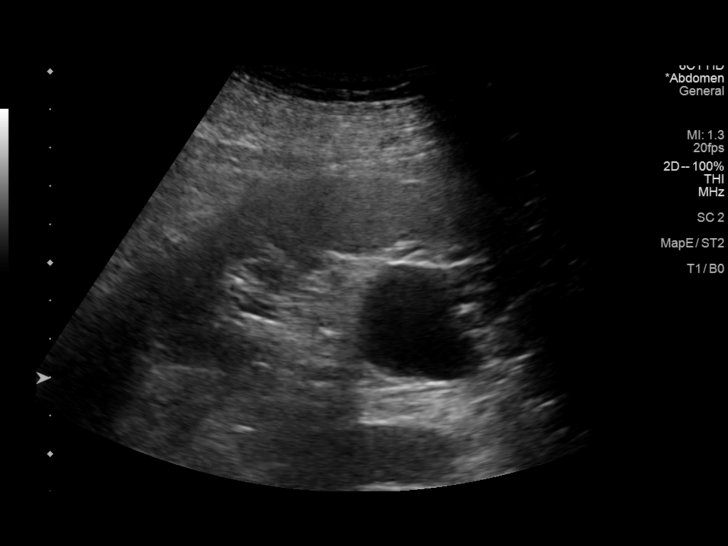
[im 87/105]
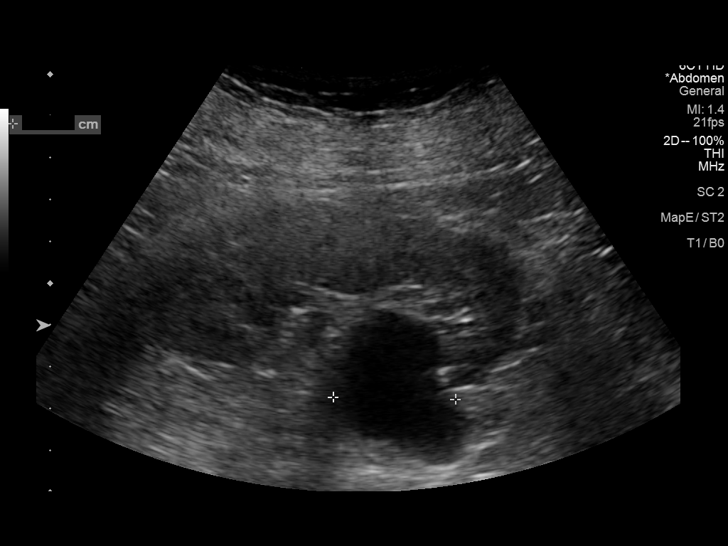
[im 96/105]
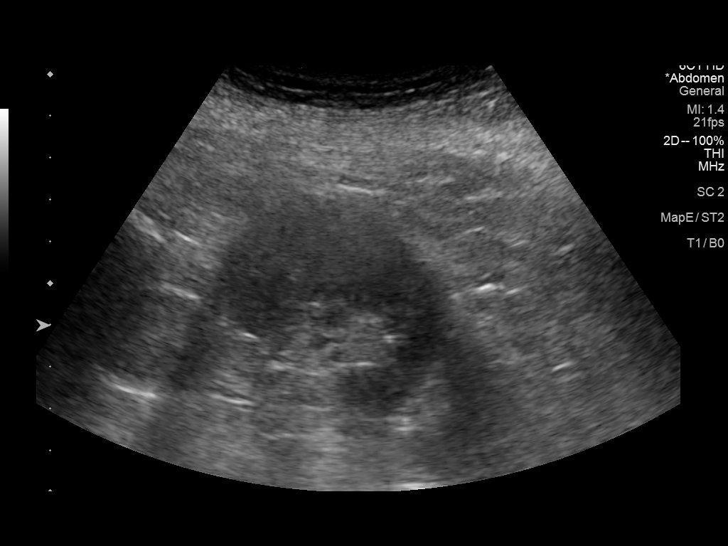
[im 105/105]
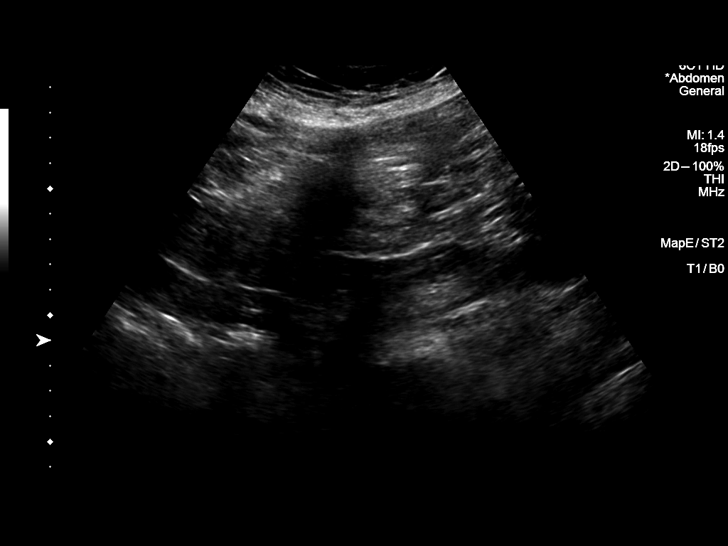

[13 of 25 positions shown; findings below may reference images not displayed]

FINDINGS: Gallbladder: There multiple stones within the gallbladder. No
gallbladder wall thickening or pericholecystic fluid. Negative
sonographic Murphy's sign.

Common bile duct: Diameter: 5 mm

Liver: There is diffuse increased liver echogenicity most commonly
seen in the setting of fatty infiltration. Superimposed inflammation
or fibrosis is not excluded. Clinical correlation is recommended.
Portal vein is patent on color Doppler imaging with normal direction
of blood flow towards the liver.

IVC: No abnormality visualized.

Pancreas: Visualized portion unremarkable.

Spleen: Size and appearance within normal limits.

Right Kidney: Length: 9.6 cm. Echogenicity within normal limits. No
mass or hydronephrosis visualized.

Left Kidney: Length: 9.8 cm. Normal echogenicity. No hydronephrosis
or shadowing stone. Small cysts measure up to 3.5 cm in the lower
pole.

Abdominal aorta: No aneurysm visualized.

Other findings: None.
IMPRESSION: 1. Cholelithiasis without sonographic evidence of acute
cholecystitis.
2. Fatty liver.
3. Left renal cysts.

## 2022-11-22 DIAGNOSIS — H401113 Primary open-angle glaucoma, right eye, severe stage: Secondary | ICD-10-CM | POA: Diagnosis not present

## 2022-12-10 DIAGNOSIS — N183 Chronic kidney disease, stage 3 unspecified: Secondary | ICD-10-CM | POA: Diagnosis not present

## 2022-12-10 DIAGNOSIS — E1122 Type 2 diabetes mellitus with diabetic chronic kidney disease: Secondary | ICD-10-CM | POA: Diagnosis not present

## 2022-12-10 DIAGNOSIS — M150 Primary generalized (osteo)arthritis: Secondary | ICD-10-CM | POA: Diagnosis not present

## 2022-12-10 DIAGNOSIS — I509 Heart failure, unspecified: Secondary | ICD-10-CM | POA: Diagnosis not present

## 2022-12-10 DIAGNOSIS — I10 Essential (primary) hypertension: Secondary | ICD-10-CM | POA: Diagnosis not present

## 2022-12-10 DIAGNOSIS — M810 Age-related osteoporosis without current pathological fracture: Secondary | ICD-10-CM | POA: Diagnosis not present

## 2022-12-10 DIAGNOSIS — R0982 Postnasal drip: Secondary | ICD-10-CM | POA: Diagnosis not present

## 2022-12-10 DIAGNOSIS — E538 Deficiency of other specified B group vitamins: Secondary | ICD-10-CM | POA: Diagnosis not present

## 2022-12-10 DIAGNOSIS — I2510 Atherosclerotic heart disease of native coronary artery without angina pectoris: Secondary | ICD-10-CM | POA: Diagnosis not present

## 2022-12-10 DIAGNOSIS — E785 Hyperlipidemia, unspecified: Secondary | ICD-10-CM | POA: Diagnosis not present

## 2022-12-10 DIAGNOSIS — J302 Other seasonal allergic rhinitis: Secondary | ICD-10-CM | POA: Diagnosis not present

## 2022-12-10 DIAGNOSIS — Z7984 Long term (current) use of oral hypoglycemic drugs: Secondary | ICD-10-CM | POA: Diagnosis not present

## 2022-12-14 DIAGNOSIS — E119 Type 2 diabetes mellitus without complications: Secondary | ICD-10-CM | POA: Diagnosis not present

## 2022-12-14 DIAGNOSIS — D649 Anemia, unspecified: Secondary | ICD-10-CM | POA: Diagnosis not present

## 2022-12-14 DIAGNOSIS — I4891 Unspecified atrial fibrillation: Secondary | ICD-10-CM | POA: Diagnosis not present

## 2022-12-14 DIAGNOSIS — E559 Vitamin D deficiency, unspecified: Secondary | ICD-10-CM | POA: Diagnosis not present

## 2022-12-14 DIAGNOSIS — I10 Essential (primary) hypertension: Secondary | ICD-10-CM | POA: Diagnosis not present

## 2022-12-17 DIAGNOSIS — L602 Onychogryphosis: Secondary | ICD-10-CM | POA: Diagnosis not present

## 2022-12-17 DIAGNOSIS — Z794 Long term (current) use of insulin: Secondary | ICD-10-CM | POA: Diagnosis not present

## 2022-12-17 DIAGNOSIS — Z23 Encounter for immunization: Secondary | ICD-10-CM | POA: Diagnosis not present

## 2022-12-17 DIAGNOSIS — L603 Nail dystrophy: Secondary | ICD-10-CM | POA: Diagnosis not present

## 2022-12-17 DIAGNOSIS — E1151 Type 2 diabetes mellitus with diabetic peripheral angiopathy without gangrene: Secondary | ICD-10-CM | POA: Diagnosis not present

## 2022-12-19 DIAGNOSIS — Z4420 Encounter for fitting and adjustment of artificial eye, unspecified: Secondary | ICD-10-CM | POA: Diagnosis not present

## 2022-12-20 DIAGNOSIS — Z4422 Encounter for fitting and adjustment of artificial left eye: Secondary | ICD-10-CM | POA: Diagnosis not present

## 2022-12-21 DIAGNOSIS — E119 Type 2 diabetes mellitus without complications: Secondary | ICD-10-CM | POA: Diagnosis not present

## 2022-12-22 DIAGNOSIS — M79604 Pain in right leg: Secondary | ICD-10-CM | POA: Diagnosis not present

## 2022-12-22 DIAGNOSIS — E785 Hyperlipidemia, unspecified: Secondary | ICD-10-CM | POA: Diagnosis not present

## 2022-12-22 DIAGNOSIS — I10 Essential (primary) hypertension: Secondary | ICD-10-CM | POA: Diagnosis not present

## 2022-12-22 DIAGNOSIS — M79651 Pain in right thigh: Secondary | ICD-10-CM | POA: Diagnosis not present

## 2022-12-22 DIAGNOSIS — M150 Primary generalized (osteo)arthritis: Secondary | ICD-10-CM | POA: Diagnosis not present

## 2022-12-22 DIAGNOSIS — K760 Fatty (change of) liver, not elsewhere classified: Secondary | ICD-10-CM | POA: Diagnosis not present

## 2022-12-22 DIAGNOSIS — E119 Type 2 diabetes mellitus without complications: Secondary | ICD-10-CM | POA: Diagnosis not present

## 2022-12-22 DIAGNOSIS — Z7984 Long term (current) use of oral hypoglycemic drugs: Secondary | ICD-10-CM | POA: Diagnosis not present

## 2022-12-22 DIAGNOSIS — M25561 Pain in right knee: Secondary | ICD-10-CM | POA: Diagnosis not present

## 2022-12-24 DIAGNOSIS — R1310 Dysphagia, unspecified: Secondary | ICD-10-CM | POA: Diagnosis not present

## 2022-12-24 DIAGNOSIS — M6281 Muscle weakness (generalized): Secondary | ICD-10-CM | POA: Diagnosis not present

## 2022-12-24 DIAGNOSIS — E1142 Type 2 diabetes mellitus with diabetic polyneuropathy: Secondary | ICD-10-CM | POA: Diagnosis not present

## 2022-12-25 DIAGNOSIS — E1142 Type 2 diabetes mellitus with diabetic polyneuropathy: Secondary | ICD-10-CM | POA: Diagnosis not present

## 2022-12-25 DIAGNOSIS — M6281 Muscle weakness (generalized): Secondary | ICD-10-CM | POA: Diagnosis not present

## 2022-12-25 DIAGNOSIS — R1310 Dysphagia, unspecified: Secondary | ICD-10-CM | POA: Diagnosis not present

## 2022-12-26 DIAGNOSIS — E1142 Type 2 diabetes mellitus with diabetic polyneuropathy: Secondary | ICD-10-CM | POA: Diagnosis not present

## 2022-12-26 DIAGNOSIS — M6281 Muscle weakness (generalized): Secondary | ICD-10-CM | POA: Diagnosis not present

## 2022-12-26 DIAGNOSIS — R1310 Dysphagia, unspecified: Secondary | ICD-10-CM | POA: Diagnosis not present

## 2022-12-27 DIAGNOSIS — M6281 Muscle weakness (generalized): Secondary | ICD-10-CM | POA: Diagnosis not present

## 2022-12-27 DIAGNOSIS — M150 Primary generalized (osteo)arthritis: Secondary | ICD-10-CM | POA: Diagnosis not present

## 2022-12-27 DIAGNOSIS — I10 Essential (primary) hypertension: Secondary | ICD-10-CM | POA: Diagnosis not present

## 2022-12-27 DIAGNOSIS — E1142 Type 2 diabetes mellitus with diabetic polyneuropathy: Secondary | ICD-10-CM | POA: Diagnosis not present

## 2022-12-27 DIAGNOSIS — W19XXXD Unspecified fall, subsequent encounter: Secondary | ICD-10-CM | POA: Diagnosis not present

## 2022-12-27 DIAGNOSIS — M25561 Pain in right knee: Secondary | ICD-10-CM | POA: Diagnosis not present

## 2022-12-27 DIAGNOSIS — M810 Age-related osteoporosis without current pathological fracture: Secondary | ICD-10-CM | POA: Diagnosis not present

## 2022-12-27 DIAGNOSIS — R1310 Dysphagia, unspecified: Secondary | ICD-10-CM | POA: Diagnosis not present

## 2022-12-28 DIAGNOSIS — R1310 Dysphagia, unspecified: Secondary | ICD-10-CM | POA: Diagnosis not present

## 2022-12-28 DIAGNOSIS — E1142 Type 2 diabetes mellitus with diabetic polyneuropathy: Secondary | ICD-10-CM | POA: Diagnosis not present

## 2022-12-28 DIAGNOSIS — M6281 Muscle weakness (generalized): Secondary | ICD-10-CM | POA: Diagnosis not present

## 2022-12-31 DIAGNOSIS — M6281 Muscle weakness (generalized): Secondary | ICD-10-CM | POA: Diagnosis not present

## 2022-12-31 DIAGNOSIS — E1142 Type 2 diabetes mellitus with diabetic polyneuropathy: Secondary | ICD-10-CM | POA: Diagnosis not present

## 2022-12-31 DIAGNOSIS — R1310 Dysphagia, unspecified: Secondary | ICD-10-CM | POA: Diagnosis not present

## 2023-01-01 DIAGNOSIS — M6281 Muscle weakness (generalized): Secondary | ICD-10-CM | POA: Diagnosis not present

## 2023-01-01 DIAGNOSIS — E1142 Type 2 diabetes mellitus with diabetic polyneuropathy: Secondary | ICD-10-CM | POA: Diagnosis not present

## 2023-01-02 DIAGNOSIS — E1142 Type 2 diabetes mellitus with diabetic polyneuropathy: Secondary | ICD-10-CM | POA: Diagnosis not present

## 2023-01-02 DIAGNOSIS — M6281 Muscle weakness (generalized): Secondary | ICD-10-CM | POA: Diagnosis not present

## 2023-01-03 DIAGNOSIS — Z7185 Encounter for immunization safety counseling: Secondary | ICD-10-CM | POA: Diagnosis not present

## 2023-01-03 DIAGNOSIS — Z23 Encounter for immunization: Secondary | ICD-10-CM | POA: Diagnosis not present

## 2023-01-03 DIAGNOSIS — E1142 Type 2 diabetes mellitus with diabetic polyneuropathy: Secondary | ICD-10-CM | POA: Diagnosis not present

## 2023-01-03 DIAGNOSIS — M6281 Muscle weakness (generalized): Secondary | ICD-10-CM | POA: Diagnosis not present

## 2023-01-04 DIAGNOSIS — M6281 Muscle weakness (generalized): Secondary | ICD-10-CM | POA: Diagnosis not present

## 2023-01-04 DIAGNOSIS — E1142 Type 2 diabetes mellitus with diabetic polyneuropathy: Secondary | ICD-10-CM | POA: Diagnosis not present

## 2023-01-07 DIAGNOSIS — E1142 Type 2 diabetes mellitus with diabetic polyneuropathy: Secondary | ICD-10-CM | POA: Diagnosis not present

## 2023-01-07 DIAGNOSIS — M6281 Muscle weakness (generalized): Secondary | ICD-10-CM | POA: Diagnosis not present

## 2023-01-08 DIAGNOSIS — M6281 Muscle weakness (generalized): Secondary | ICD-10-CM | POA: Diagnosis not present

## 2023-01-08 DIAGNOSIS — E1142 Type 2 diabetes mellitus with diabetic polyneuropathy: Secondary | ICD-10-CM | POA: Diagnosis not present

## 2023-01-09 ENCOUNTER — Other Ambulatory Visit: Payer: Self-pay | Admitting: Gastroenterology

## 2023-01-09 DIAGNOSIS — R1311 Dysphagia, oral phase: Secondary | ICD-10-CM | POA: Diagnosis not present

## 2023-01-09 DIAGNOSIS — K639 Disease of intestine, unspecified: Secondary | ICD-10-CM | POA: Diagnosis not present

## 2023-01-09 DIAGNOSIS — E1142 Type 2 diabetes mellitus with diabetic polyneuropathy: Secondary | ICD-10-CM | POA: Diagnosis not present

## 2023-01-09 DIAGNOSIS — M6281 Muscle weakness (generalized): Secondary | ICD-10-CM | POA: Diagnosis not present

## 2023-01-09 DIAGNOSIS — R1084 Generalized abdominal pain: Secondary | ICD-10-CM | POA: Diagnosis not present

## 2023-01-10 DIAGNOSIS — M6281 Muscle weakness (generalized): Secondary | ICD-10-CM | POA: Diagnosis not present

## 2023-01-10 DIAGNOSIS — E1142 Type 2 diabetes mellitus with diabetic polyneuropathy: Secondary | ICD-10-CM | POA: Diagnosis not present

## 2023-01-10 DIAGNOSIS — M19032 Primary osteoarthritis, left wrist: Secondary | ICD-10-CM | POA: Diagnosis not present

## 2023-01-10 DIAGNOSIS — R748 Abnormal levels of other serum enzymes: Secondary | ICD-10-CM | POA: Diagnosis not present

## 2023-01-10 DIAGNOSIS — M170 Bilateral primary osteoarthritis of knee: Secondary | ICD-10-CM | POA: Diagnosis not present

## 2023-01-10 DIAGNOSIS — M19012 Primary osteoarthritis, left shoulder: Secondary | ICD-10-CM | POA: Diagnosis not present

## 2023-01-10 DIAGNOSIS — M19031 Primary osteoarthritis, right wrist: Secondary | ICD-10-CM | POA: Diagnosis not present

## 2023-01-10 DIAGNOSIS — M19011 Primary osteoarthritis, right shoulder: Secondary | ICD-10-CM | POA: Diagnosis not present

## 2023-01-11 DIAGNOSIS — M6281 Muscle weakness (generalized): Secondary | ICD-10-CM | POA: Diagnosis not present

## 2023-01-11 DIAGNOSIS — E1142 Type 2 diabetes mellitus with diabetic polyneuropathy: Secondary | ICD-10-CM | POA: Diagnosis not present

## 2023-01-14 DIAGNOSIS — E1142 Type 2 diabetes mellitus with diabetic polyneuropathy: Secondary | ICD-10-CM | POA: Diagnosis not present

## 2023-01-14 DIAGNOSIS — M6281 Muscle weakness (generalized): Secondary | ICD-10-CM | POA: Diagnosis not present

## 2023-01-15 DIAGNOSIS — M6281 Muscle weakness (generalized): Secondary | ICD-10-CM | POA: Diagnosis not present

## 2023-01-15 DIAGNOSIS — E1142 Type 2 diabetes mellitus with diabetic polyneuropathy: Secondary | ICD-10-CM | POA: Diagnosis not present

## 2023-01-16 DIAGNOSIS — E1142 Type 2 diabetes mellitus with diabetic polyneuropathy: Secondary | ICD-10-CM | POA: Diagnosis not present

## 2023-01-16 DIAGNOSIS — M6281 Muscle weakness (generalized): Secondary | ICD-10-CM | POA: Diagnosis not present

## 2023-01-17 DIAGNOSIS — M6281 Muscle weakness (generalized): Secondary | ICD-10-CM | POA: Diagnosis not present

## 2023-01-17 DIAGNOSIS — E1142 Type 2 diabetes mellitus with diabetic polyneuropathy: Secondary | ICD-10-CM | POA: Diagnosis not present

## 2023-01-18 DIAGNOSIS — E1142 Type 2 diabetes mellitus with diabetic polyneuropathy: Secondary | ICD-10-CM | POA: Diagnosis not present

## 2023-01-18 DIAGNOSIS — M6281 Muscle weakness (generalized): Secondary | ICD-10-CM | POA: Diagnosis not present

## 2023-01-21 DIAGNOSIS — E1142 Type 2 diabetes mellitus with diabetic polyneuropathy: Secondary | ICD-10-CM | POA: Diagnosis not present

## 2023-01-21 DIAGNOSIS — M6281 Muscle weakness (generalized): Secondary | ICD-10-CM | POA: Diagnosis not present

## 2023-01-22 DIAGNOSIS — M6281 Muscle weakness (generalized): Secondary | ICD-10-CM | POA: Diagnosis not present

## 2023-01-22 DIAGNOSIS — E1151 Type 2 diabetes mellitus with diabetic peripheral angiopathy without gangrene: Secondary | ICD-10-CM | POA: Diagnosis not present

## 2023-01-22 DIAGNOSIS — E1142 Type 2 diabetes mellitus with diabetic polyneuropathy: Secondary | ICD-10-CM | POA: Diagnosis not present

## 2023-01-23 DIAGNOSIS — M6281 Muscle weakness (generalized): Secondary | ICD-10-CM | POA: Diagnosis not present

## 2023-01-23 DIAGNOSIS — E1142 Type 2 diabetes mellitus with diabetic polyneuropathy: Secondary | ICD-10-CM | POA: Diagnosis not present

## 2023-01-24 ENCOUNTER — Ambulatory Visit
Admission: RE | Admit: 2023-01-24 | Discharge: 2023-01-24 | Disposition: A | Discharge status: Home / Self Care | Payer: 59 | Source: Ambulatory Visit | Attending: Gastroenterology | Admitting: Gastroenterology

## 2023-01-24 ENCOUNTER — Other Ambulatory Visit: Payer: Self-pay | Admitting: Gastroenterology

## 2023-01-24 DIAGNOSIS — E1142 Type 2 diabetes mellitus with diabetic polyneuropathy: Secondary | ICD-10-CM | POA: Diagnosis not present

## 2023-01-24 DIAGNOSIS — R1311 Dysphagia, oral phase: Secondary | ICD-10-CM

## 2023-01-24 DIAGNOSIS — M6281 Muscle weakness (generalized): Secondary | ICD-10-CM | POA: Diagnosis not present

## 2023-01-25 DIAGNOSIS — M6281 Muscle weakness (generalized): Secondary | ICD-10-CM | POA: Diagnosis not present

## 2023-01-25 DIAGNOSIS — E1142 Type 2 diabetes mellitus with diabetic polyneuropathy: Secondary | ICD-10-CM | POA: Diagnosis not present

## 2023-01-26 DIAGNOSIS — E1142 Type 2 diabetes mellitus with diabetic polyneuropathy: Secondary | ICD-10-CM | POA: Diagnosis not present

## 2023-01-26 DIAGNOSIS — M6281 Muscle weakness (generalized): Secondary | ICD-10-CM | POA: Diagnosis not present

## 2023-01-28 DIAGNOSIS — E1142 Type 2 diabetes mellitus with diabetic polyneuropathy: Secondary | ICD-10-CM | POA: Diagnosis not present

## 2023-01-28 DIAGNOSIS — M6281 Muscle weakness (generalized): Secondary | ICD-10-CM | POA: Diagnosis not present

## 2023-01-29 DIAGNOSIS — M6281 Muscle weakness (generalized): Secondary | ICD-10-CM | POA: Diagnosis not present

## 2023-01-29 DIAGNOSIS — E1142 Type 2 diabetes mellitus with diabetic polyneuropathy: Secondary | ICD-10-CM | POA: Diagnosis not present

## 2023-01-30 DIAGNOSIS — M6281 Muscle weakness (generalized): Secondary | ICD-10-CM | POA: Diagnosis not present

## 2023-01-30 DIAGNOSIS — E1142 Type 2 diabetes mellitus with diabetic polyneuropathy: Secondary | ICD-10-CM | POA: Diagnosis not present

## 2023-01-31 DIAGNOSIS — M6281 Muscle weakness (generalized): Secondary | ICD-10-CM | POA: Diagnosis not present

## 2023-01-31 DIAGNOSIS — E1142 Type 2 diabetes mellitus with diabetic polyneuropathy: Secondary | ICD-10-CM | POA: Diagnosis not present

## 2023-02-01 DIAGNOSIS — M6281 Muscle weakness (generalized): Secondary | ICD-10-CM | POA: Diagnosis not present

## 2023-02-01 DIAGNOSIS — E1142 Type 2 diabetes mellitus with diabetic polyneuropathy: Secondary | ICD-10-CM | POA: Diagnosis not present

## 2023-02-04 DIAGNOSIS — N183 Chronic kidney disease, stage 3 unspecified: Secondary | ICD-10-CM | POA: Diagnosis not present

## 2023-02-04 DIAGNOSIS — M170 Bilateral primary osteoarthritis of knee: Secondary | ICD-10-CM | POA: Diagnosis not present

## 2023-02-04 DIAGNOSIS — I110 Hypertensive heart disease with heart failure: Secondary | ICD-10-CM | POA: Diagnosis not present

## 2023-02-04 DIAGNOSIS — E785 Hyperlipidemia, unspecified: Secondary | ICD-10-CM | POA: Diagnosis not present

## 2023-02-04 DIAGNOSIS — M6281 Muscle weakness (generalized): Secondary | ICD-10-CM | POA: Diagnosis not present

## 2023-02-04 DIAGNOSIS — M810 Age-related osteoporosis without current pathological fracture: Secondary | ICD-10-CM | POA: Diagnosis not present

## 2023-02-04 DIAGNOSIS — K760 Fatty (change of) liver, not elsewhere classified: Secondary | ICD-10-CM | POA: Diagnosis not present

## 2023-02-04 DIAGNOSIS — E1122 Type 2 diabetes mellitus with diabetic chronic kidney disease: Secondary | ICD-10-CM | POA: Diagnosis not present

## 2023-02-04 DIAGNOSIS — M19012 Primary osteoarthritis, left shoulder: Secondary | ICD-10-CM | POA: Diagnosis not present

## 2023-02-04 DIAGNOSIS — R748 Abnormal levels of other serum enzymes: Secondary | ICD-10-CM | POA: Diagnosis not present

## 2023-02-04 DIAGNOSIS — E538 Deficiency of other specified B group vitamins: Secondary | ICD-10-CM | POA: Diagnosis not present

## 2023-02-04 DIAGNOSIS — E1142 Type 2 diabetes mellitus with diabetic polyneuropathy: Secondary | ICD-10-CM | POA: Diagnosis not present

## 2023-02-04 DIAGNOSIS — I509 Heart failure, unspecified: Secondary | ICD-10-CM | POA: Diagnosis not present

## 2023-02-04 DIAGNOSIS — J302 Other seasonal allergic rhinitis: Secondary | ICD-10-CM | POA: Diagnosis not present

## 2023-02-05 DIAGNOSIS — M6281 Muscle weakness (generalized): Secondary | ICD-10-CM | POA: Diagnosis not present

## 2023-02-05 DIAGNOSIS — H401114 Primary open-angle glaucoma, right eye, indeterminate stage: Secondary | ICD-10-CM | POA: Diagnosis not present

## 2023-02-05 DIAGNOSIS — E1142 Type 2 diabetes mellitus with diabetic polyneuropathy: Secondary | ICD-10-CM | POA: Diagnosis not present

## 2023-02-06 DIAGNOSIS — E1142 Type 2 diabetes mellitus with diabetic polyneuropathy: Secondary | ICD-10-CM | POA: Diagnosis not present

## 2023-02-06 DIAGNOSIS — M6281 Muscle weakness (generalized): Secondary | ICD-10-CM | POA: Diagnosis not present

## 2023-02-07 DIAGNOSIS — E1142 Type 2 diabetes mellitus with diabetic polyneuropathy: Secondary | ICD-10-CM | POA: Diagnosis not present

## 2023-02-07 DIAGNOSIS — M6281 Muscle weakness (generalized): Secondary | ICD-10-CM | POA: Diagnosis not present

## 2023-02-08 DIAGNOSIS — L02421 Furuncle of right axilla: Secondary | ICD-10-CM | POA: Diagnosis not present

## 2023-02-08 DIAGNOSIS — I509 Heart failure, unspecified: Secondary | ICD-10-CM | POA: Diagnosis not present

## 2023-02-08 DIAGNOSIS — M6281 Muscle weakness (generalized): Secondary | ICD-10-CM | POA: Diagnosis not present

## 2023-02-08 DIAGNOSIS — N183 Chronic kidney disease, stage 3 unspecified: Secondary | ICD-10-CM | POA: Diagnosis not present

## 2023-02-08 DIAGNOSIS — E1142 Type 2 diabetes mellitus with diabetic polyneuropathy: Secondary | ICD-10-CM | POA: Diagnosis not present

## 2023-02-08 DIAGNOSIS — I130 Hypertensive heart and chronic kidney disease with heart failure and stage 1 through stage 4 chronic kidney disease, or unspecified chronic kidney disease: Secondary | ICD-10-CM | POA: Diagnosis not present

## 2023-02-08 DIAGNOSIS — Z7984 Long term (current) use of oral hypoglycemic drugs: Secondary | ICD-10-CM | POA: Diagnosis not present

## 2023-02-08 DIAGNOSIS — E1122 Type 2 diabetes mellitus with diabetic chronic kidney disease: Secondary | ICD-10-CM | POA: Diagnosis not present

## 2023-02-11 ENCOUNTER — Ambulatory Visit (INDEPENDENT_AMBULATORY_CARE_PROVIDER_SITE_OTHER): Payer: 59 | Admitting: Neurology

## 2023-02-11 ENCOUNTER — Encounter: Payer: Self-pay | Admitting: Neurology

## 2023-02-11 VITALS — BP 154/83 | HR 74 | Ht 63.0 in | Wt 170.0 lb

## 2023-02-11 DIAGNOSIS — G301 Alzheimer's disease with late onset: Secondary | ICD-10-CM | POA: Diagnosis not present

## 2023-02-11 DIAGNOSIS — M6281 Muscle weakness (generalized): Secondary | ICD-10-CM | POA: Diagnosis not present

## 2023-02-11 DIAGNOSIS — F02A0 Dementia in other diseases classified elsewhere, mild, without behavioral disturbance, psychotic disturbance, mood disturbance, and anxiety: Secondary | ICD-10-CM

## 2023-02-11 DIAGNOSIS — E1142 Type 2 diabetes mellitus with diabetic polyneuropathy: Secondary | ICD-10-CM | POA: Diagnosis not present

## 2023-02-11 MED ORDER — RIVASTIGMINE TARTRATE 4.5 MG PO CAPS: 4.5000 mg | ORAL_CAPSULE | Freq: Two times a day (BID) | ORAL | 11 refills | Status: DC

## 2023-02-11 NOTE — Patient Instructions (Signed)
Continue current medications including Exelon 4.5 mg twice daily Follow-up in 1 year or sooner if worse.   There are well-accepted and sensible ways to reduce risk for Alzheimers disease and other degenerative brain disorders .  Exercise Daily Walk A daily 20 minute walk should be part of your routine. Disease related apathy can be a significant roadblock to exercise and the only way to overcome this is to make it a daily routine and perhaps have a reward at the end (something your loved one loves to eat or drink perhaps) or a personal trainer coming to the home can also be very useful. Most importantly, the patient is much more likely to exercise if the caregiver / spouse does it with him/her. In general a structured, repetitive schedule is best.  General Health: Any diseases which effect your body will effect your brain such as a pneumonia, urinary infection, blood clot, heart attack or stroke. Keep contact with your primary care doctor for regular follow ups.  Sleep. A good nights sleep is healthy for the brain. Seven hours is recommended. If you have insomnia or poor sleep habits we can give you some instructions. If you have sleep apnea wear your mask.  Diet: Eating a heart healthy diet is also a good idea; fish and poultry instead of red meat, nuts (mostly non-peanuts), vegetables, fruits, olive oil or canola oil (instead of butter), minimal salt (use other spices to flavor foods), whole grain rice, bread, cereal and pasta and wine in moderation.Research is now showing that the MIND diet, which is a combination of The Mediterranean diet and the DASH diet, is beneficial for cognitive processing and longevity. Information about this diet can be found in The MIND Diet, a book by Alonna Minium, MS, RDN, and online at [employer]  Finances, Power of [address] and Advance Directives: You should consider putting legal safeguards in place with regard to financial and  medical decision making. While the spouse always has power of attorney for medical and financial issues in the absence of any form, you should consider what you want in case the spouse / caregiver is no longer around or capable of making decisions.

## 2023-02-11 NOTE — Progress Notes (Signed)
[facility]  PATIENT: Lindsey Baldwin DOB: 14-Sep-1945  REQUESTING CLINICIAN: Deatra James, MD HISTORY FROM: Patient and friend Sue Lush REASON FOR VISIT: Dementia follow up   HISTORICAL  CHIEF COMPLAINT:  Chief Complaint  Patient presents with   Follow-up    Rm 13. Patient with legal guardian. Took her off exelon at the facility and then put her back on it, care giver want her on something, whether it is exelon or not. Did report starting to shake and having more trouble with memory. Please print any prescriptions for facility. Has had falls recently as well    INTERVAL HISTORY 02/11/2023:  Patient presents today for follow-up, she is accompanied by her legal guardian Sue Lush.  Last visit was a year ago since then she has been stable.  She does live at an assisted living facility, on Exelon 4.5 mg twice daily.  They reported her memory is stable, no new concerns.  At 1 point in September she was off the Exelon but the facility put her back.    INTERVAL HISTORY 02/05/2022:  Patient presents today for follow-up, she is accompanied by a friend Sue Lush.  Since last visit she continues to improve.  Currently she is a resident at [facility] and is doing well.  She likes the facility and said that she is getting physical therapy, she is eating well, she is sleeping well and currently does not have any complaints.  Sue Lush is now taking care of her finances.   HISTORY OF PRESENT ILLNESS:  This is a 77 year old woman with medical history including hypertension, hyperlipidemia, diabetes mellitus, overactive bladder who is presenting with her longtime friend Sue Lush for memory deficit.  Per Sue Lush who is now at the power of attorney, she reports patient has been suffering with memory for the past 2 years.  She has known patient for more than 30 years.  Memory deficits started by patient repeating herself, being forgetful asking the same questions over and over.  She also mismanaged her  money.  She sold her house and her financial advisor set her up for the next 10 years but she mismanaged the money and ran out of money in 4 years.  Currently she is unable to pay her own rent.  She cannot explain how or where the money is gone.  On top of that she forgets to take her medications, there are bottles of medications all over her apartment and she is also forgetting doctors appointments.  There is also concern for patient on self-care.  Currently she does live in independent living facility and Sue Lush is trying to take her to a assisted living facility.  TBI:   No past history of TBI Stroke:   no past history of stroke Seizures:   no past history of seizures Sleep:  Yes, uses a CPAP machine.  Mood:  patient denies anxiety and depression  Functional status: dependent in some ADLs and IADLs Patient lives independent living.  Cooking: Meals are provided Cleaning: No Shopping: Yes  Bathing: self  Toileting: self Driving: Not driving  Bills: Need helps with her bills, cannot manage her own affair anymore   Ever left the stove on by accident?: Does not cook  Forget how to use items around the house?: No  Getting lost going to familiar places?: No  Forgetting loved ones names?: Yes  Word finding difficulty? No  Sleep: 6 to 7 hrs    OTHER MEDICAL CONDITIONS: Hypertension, hyperlipidemia, diabetes mellitus type 2, overactive bladder  REVIEW OF SYSTEMS: Full 14 system review of systems performed and negative with exception of: as noted in the HPI   ALLERGIES: Allergies  Allergen Reactions   Apraclonidine Hcl Other (See Comments)    Unknown reaction Pt does not recall   Penicillins Swelling and Other (See Comments)    Has patient had a PCN reaction causing immediate rash, facial/tongue/throat swelling, SOB or lightheadedness with hypotension: YES Has patient had a PCN reaction causing severe rash involving mucus membranes or skin necrosis: NO Has patient had a PCN reaction  that required hospitalization: NO Has patient had a PCN reaction occurring within the last 10 years: NO If all of the above answers are "NO", then may proceed with Cephalosporin use.    Sulfa Antibiotics Rash    HOME MEDICATIONS: Outpatient Medications Prior to Visit  Medication Sig Dispense Refill   doxycycline (VIBRAMYCIN) 100 MG capsule Take 100 mg by mouth 2 (two) times daily.     DULoxetine (CYMBALTA) 60 MG capsule Take 60 mg by mouth daily.     acetaminophen (TYLENOL) 500 MG tablet Take 500 mg by mouth every 6 (six) hours as needed for moderate pain.     alendronate (FOSAMAX) 70 MG tablet Take 70 mg by mouth every Monday.     ASPIRIN 81 PO Take 81 mg by mouth daily.     atorvastatin (LIPITOR) 40 MG tablet Take 40 mg by mouth in the morning.     calcium carbonate (TUMS - DOSED IN MG ELEMENTAL CALCIUM) 500 MG chewable tablet Chew 500 mg by mouth 4 (four) times daily as needed for heartburn.     Cholecalciferol (D3 2000) 50 MCG (2000 UT) CAPS Take 2,000 Units by mouth daily.     cyanocobalamin (VITAMIN B12) 1000 MCG tablet Take 1 tablet (1,000 mcg total) by mouth daily. 90 tablet 2   enalapril (VASOTEC) 10 MG tablet Take 10 mg by mouth daily.     famotidine (PEPCID) 20 MG tablet Take 1 tablet (20 mg total) by mouth daily. (Patient taking differently: Take 20 mg by mouth daily as needed for heartburn or indigestion.)     furosemide (LASIX) 20 MG tablet Take 20 mg by mouth every other day.     guaiFENesin (MUCINEX) 600 MG 12 hr tablet Take 600 mg by mouth 2 (two) times daily as needed.     ibuprofen (ADVIL) 200 MG tablet Take 400 mg by mouth daily as needed for headache or mild pain.     insulin aspart (NOVOLOG) 100 UNIT/ML injection 0-15 Units, Subcutaneous, 3 times daily with meals CBG < 70: Implement Hypoglycemia protocol CBG 70 - 120: 0 units CBG 121 - 150: 2 units CBG 151 - 200: 3 units CBG 201 - 250: 5 units CBG 251 - 300: 8 units CBG 301 - 350: 11 units CBG 351 - 400: 15  units CBG > 400: call MD 10 mL 11   latanoprost (XALATAN) 0.005 % ophthalmic solution Place 1 drop into the right eye at bedtime.     lidocaine (XYLOCAINE) 4 % external solution Apply topically as needed.     loratadine (CLARITIN) 10 MG tablet Take 10 mg by mouth daily as needed for allergies.     Menthol, Topical Analgesic, (BIOFREEZE) 10 % CREA Apply 1 Application topically daily as needed (pain).     metFORMIN (GLUCOPHAGE-XR) 500 MG 24 hr tablet Take 1,000 mg by mouth at bedtime.     metoprolol tartrate (LOPRESSOR) 25 MG tablet Take 0.5 tablets (12.5 mg  total) by mouth 2 (two) times daily.     miconazole (MICOTIN) 2 % cream Apply 1 Application topically 2 (two) times daily.     Multiple Vitamins-Minerals (MULTIVITAMIN WITH MINERALS) tablet Take 1 tablet by mouth daily.     MYRBETRIQ 50 MG TB24 tablet TAKE 1 TABLET EACH DAY. 30 tablet PRN   naproxen sodium (ALEVE) 220 MG tablet Take 440 mg by mouth daily as needed (pain).     pilocarpine (PILOCAR) 1 % ophthalmic solution Place 1 drop into the right eye 4 (four) times daily.     sitaGLIPtin (JANUVIA) 100 MG tablet Take 100 mg by mouth daily.     solifenacin (VESICARE) 10 MG tablet Take 10 mg by mouth daily.     tamsulosin (FLOMAX) 0.4 MG CAPS capsule TAKE 1 CAPSULE BY MOUTH EVERY DAY 30 capsule 3   traMADol (ULTRAM) 50 MG tablet Take 1 tablet (50 mg total) by mouth every 6 (six) hours as needed for severe pain (Pain not controlled by Tylenol, ibuprofen, ice, rest, etc.). 5 tablet 0   rivastigmine (EXELON) 4.5 MG capsule Take 1 capsule (4.5 mg total) by mouth 2 (two) times daily. 60 capsule 6   No facility-administered medications prior to visit.    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   Bronchitis    GERD (gastroesophageal reflux disease)    Glaucoma, right eye    CLOSED ANGLE   History of kidney stones    Hypertension    OA (osteoarthritis)    KNEES , HANDS   OAB  (overactive bladder)    OSA on CPAP    Osteoporosis    Type 2 diabetes mellitus (HCC)    FOLLOWED BY PCP   Wears glasses     PAST SURGICAL HISTORY: Past Surgical History:  Procedure Laterality Date   CHOLECYSTECTOMY     CYST EXCISION  08/2017   POSTERIOR NECK   CYSTOSCOPY WITH RETROGRADE PYELOGRAM, URETEROSCOPY AND STENT PLACEMENT Left 06/16/2018   Procedure: diagnostic URETEROSCOPY AND STENT PLACEMENT;  Surgeon: Malen Gauze, MD;  Location: [facility];  Service: Urology;  Laterality: Left;  1 HR   EXCISION MASS UPPER EXTREMETIES Right 11/15/2021   Procedure: EXCISION OF SUBCUTANEOUS MASS RIGHT SHOULDER;  Surgeon: Berna Bue, MD;  Location: [facility];  Service: General;  Laterality: Right;   EYE SURGERY Left 1992   REMOVE EYE AND PLACEMENT PROSTHESIS   KNEE ARTHROPLASTY Right 12/12/2017   Procedure: RIGHT TOTAL KNEE ARTHROPLASTY WITH COMPUTER NAVIGATION;  Surgeon: Samson Frederic, MD;  Location: [facility];  Service: Orthopedics;  Laterality: Right;  Needs RNFA   TONSILLECTOMY  AGE 44    FAMILY HISTORY: Family History  Problem Relation Age of Onset   Cancer Mother    Cancer Father    Breast cancer Maternal Aunt     SOCIAL HISTORY: Social History   Socioeconomic History   Marital status: Single    Spouse name: Not on file   Number of children: Not on file   Years of education: Not on file   Highest education level: Not on file  Occupational History   Not on file  Tobacco Use   Smoking status: Never   Smokeless tobacco: Never  Vaping Use   Vaping status: Never Used  Substance and Sexual Activity   Alcohol use: Never   Drug use: Never   Sexual activity: Yes    Birth control/protection: Post-menopausal  Other  Topics Concern   Not on file  Social History Narrative   Not on file   Social Determinants of Health   Financial Resource Strain: Not on file  Food Insecurity: Not on file  Transportation Needs: Not on file  Physical Activity: Not on file  Stress:  Not on file  Social Connections: Not on file  Intimate Partner Violence: Not on file    PHYSICAL EXAM  GENERAL EXAM/CONSTITUTIONAL: Vitals:  Vitals:   02/11/23 1449 02/11/23 1503  BP: (!) 152/84 (!) 154/83  Pulse: 74 74  Weight: 170 lb (77.1 kg)   Height: 5\' 3"  (1.6 m)     Body mass index is 30.11 kg/m. Wt Readings from Last 3 Encounters:  02/11/23 170 lb (77.1 kg)  11/07/21 167 lb (75.8 kg)  08/01/21 179 lb (81.2 kg)   Patient is in no distress; well developed, nourished and groomed; neck is supple  MUSCULOSKELETAL: Gait, strength, tone, movements noted in Neurologic exam below  NEUROLOGIC: MENTAL STATUS:      No data to display            02/11/2023    2:49 PM 02/05/2022    2:12 PM 08/01/2021    3:11 PM  Montreal Cognitive Assessment   Visuospatial/ Executive (0/5) 2 3 3   Naming (0/3) 3 2 3   Attention: Read list of digits (0/2) 2 2 1   Attention: Read list of letters (0/1) 1 1 1   Attention: Serial 7 subtraction starting at 100 (0/3) 1 2 1   Language: Repeat phrase (0/2) 0 1 1  Language : Fluency (0/1) 1 0 0  Abstraction (0/2) 0 1 1  Delayed Recall (0/5) 5 4 3   Orientation (0/6) 6 6 5   Total 21 22 19   Adjusted Score (based on education)  23 20    CRANIAL NERVE:  5th - facial sensation symmetric 7th - facial strength symmetric 8th - hearing intact 9th - palate elevates symmetrically, uvula midline 11th - shoulder shrug symmetric 12th - tongue protrusion midline  MOTOR:  normal bulk and tone, at least antigravity. No resting or action tremors noted   SENSORY:  normal and symmetric to light touch  GAIT/STATION:  Deferred, in a wheelchair    DIAGNOSTIC DATA (LABS, IMAGING, TESTING) - I reviewed patient records, labs, notes, testing and imaging myself where available.  Lab Results  Component Value Date   WBC 7.2 01/09/2022   HGB 12.7 01/09/2022   HCT 37.7 01/09/2022   MCV 91.3 01/09/2022   PLT 144 (L) 01/09/2022      Component Value  Date/Time   NA 138 01/09/2022 0932   K 4.1 01/09/2022 0932   CL 108 01/09/2022 0932   CO2 20 (L) 01/09/2022 0932   GLUCOSE 176 (H) 01/09/2022 0932   BUN 15 01/09/2022 0932   CREATININE 0.84 01/09/2022 0932   CALCIUM 9.8 01/09/2022 0932   PROT 6.8 01/09/2022 0932   ALBUMIN 4.1 01/09/2022 0932   AST 40 01/09/2022 0932   ALT 34 01/09/2022 0932   ALKPHOS 55 01/09/2022 0932   BILITOT 0.7 01/09/2022 0932   GFRNONAA >60 01/09/2022 0932   GFRAA >60 03/10/2019 0245   Lab Results  Component Value Date   TRIG 163 (H) 02/15/2019   Lab Results  Component Value Date   HGBA1C 8.0 (H) 11/07/2021   Lab Results  Component Value Date   VITAMINB12 309 08/01/2021   Lab Results  Component Value Date   TSH 2.890 08/01/2021   Head CT 01/09/2022 No CT  evidence of intracranial injury.   ASSESSMENT AND PLAN  77 y.o. year old female with hypertension, hyperlipidemia, diabetes mellitus who is presenting for Demential follow up.  Overall she is doing well, she is now a resident of [facility] where she enjoys the facility, doing physical therapy.  She tolerating the medication very well.  Her memory is stable. Plan will be to continue current medications including Exelon 4.5 mg twice daily and to follow up in a year.   1. Mild late onset Alzheimer's dementia without behavioral disturbance, psychotic disturbance, mood disturbance, or anxiety (HCC)      Patient Instructions  Continue current medications including Exelon 4.5 mg twice daily Follow-up in 1 year or sooner if worse.   There are well-accepted and sensible ways to reduce risk for Alzheimers disease and other degenerative brain disorders .  Exercise Daily Walk A daily 20 minute walk should be part of your routine. Disease related apathy can be a significant roadblock to exercise and the only way to overcome this is to make it a daily routine and perhaps have a reward at the end (something your loved one loves to eat or drink perhaps) or a  personal trainer coming to the home can also be very useful. Most importantly, the patient is much more likely to exercise if the caregiver / spouse does it with him/her. In general a structured, repetitive schedule is best.  General Health: Any diseases which effect your body will effect your brain such as a pneumonia, urinary infection, blood clot, heart attack or stroke. Keep contact with your primary care doctor for regular follow ups.  Sleep. A good nights sleep is healthy for the brain. Seven hours is recommended. If you have insomnia or poor sleep habits we can give you some instructions. If you have sleep apnea wear your mask.  Diet: Eating a heart healthy diet is also a good idea; fish and poultry instead of red meat, nuts (mostly non-peanuts), vegetables, fruits, olive oil or canola oil (instead of butter), minimal salt (use other spices to flavor foods), whole grain rice, bread, cereal and pasta and wine in moderation.Research is now showing that the MIND diet, which is a combination of The Mediterranean diet and the DASH diet, is beneficial for cognitive processing and longevity. Information about this diet can be found in The MIND Diet, a book by Alonna Minium, MS, RDN, and online at [employer]  Finances, Power of [address] and Advance Directives: You should consider putting legal safeguards in place with regard to financial and medical decision making. While the spouse always has power of attorney for medical and financial issues in the absence of any form, you should consider what you want in case the spouse / caregiver is no longer around or capable of making decisions.     No orders of the defined types were placed in this encounter.   Meds ordered this encounter  Medications   rivastigmine (EXELON) 4.5 MG capsule    Sig: Take 1 capsule (4.5 mg total) by mouth 2 (two) times daily.    Dispense:  60 capsule    Refill:  11    Return in about 1  year (around 02/11/2024).    Windell Norfolk, MD 02/11/2023, 5:17 PM  [facility] [address], [address] [place], [place] [address] [phone]

## 2023-02-12 DIAGNOSIS — M6281 Muscle weakness (generalized): Secondary | ICD-10-CM | POA: Diagnosis not present

## 2023-02-12 DIAGNOSIS — E1142 Type 2 diabetes mellitus with diabetic polyneuropathy: Secondary | ICD-10-CM | POA: Diagnosis not present

## 2023-02-13 DIAGNOSIS — E1142 Type 2 diabetes mellitus with diabetic polyneuropathy: Secondary | ICD-10-CM | POA: Diagnosis not present

## 2023-02-13 DIAGNOSIS — M6281 Muscle weakness (generalized): Secondary | ICD-10-CM | POA: Diagnosis not present

## 2023-02-14 DIAGNOSIS — M6281 Muscle weakness (generalized): Secondary | ICD-10-CM | POA: Diagnosis not present

## 2023-02-14 DIAGNOSIS — E1142 Type 2 diabetes mellitus with diabetic polyneuropathy: Secondary | ICD-10-CM | POA: Diagnosis not present

## 2023-02-15 DIAGNOSIS — E1142 Type 2 diabetes mellitus with diabetic polyneuropathy: Secondary | ICD-10-CM | POA: Diagnosis not present

## 2023-02-15 DIAGNOSIS — M6281 Muscle weakness (generalized): Secondary | ICD-10-CM | POA: Diagnosis not present

## 2023-02-18 DIAGNOSIS — E1142 Type 2 diabetes mellitus with diabetic polyneuropathy: Secondary | ICD-10-CM | POA: Diagnosis not present

## 2023-02-18 DIAGNOSIS — M6281 Muscle weakness (generalized): Secondary | ICD-10-CM | POA: Diagnosis not present

## 2023-02-19 DIAGNOSIS — E1142 Type 2 diabetes mellitus with diabetic polyneuropathy: Secondary | ICD-10-CM | POA: Diagnosis not present

## 2023-02-19 DIAGNOSIS — M6281 Muscle weakness (generalized): Secondary | ICD-10-CM | POA: Diagnosis not present

## 2023-02-20 DIAGNOSIS — E1142 Type 2 diabetes mellitus with diabetic polyneuropathy: Secondary | ICD-10-CM | POA: Diagnosis not present

## 2023-02-20 DIAGNOSIS — M6281 Muscle weakness (generalized): Secondary | ICD-10-CM | POA: Diagnosis not present

## 2023-02-22 DIAGNOSIS — M6281 Muscle weakness (generalized): Secondary | ICD-10-CM | POA: Diagnosis not present

## 2023-02-22 DIAGNOSIS — E1142 Type 2 diabetes mellitus with diabetic polyneuropathy: Secondary | ICD-10-CM | POA: Diagnosis not present

## 2023-03-04 DIAGNOSIS — M79604 Pain in right leg: Secondary | ICD-10-CM | POA: Diagnosis not present

## 2023-03-04 DIAGNOSIS — M810 Age-related osteoporosis without current pathological fracture: Secondary | ICD-10-CM | POA: Diagnosis not present

## 2023-03-04 DIAGNOSIS — M170 Bilateral primary osteoarthritis of knee: Secondary | ICD-10-CM | POA: Diagnosis not present

## 2023-03-04 DIAGNOSIS — W19XXXD Unspecified fall, subsequent encounter: Secondary | ICD-10-CM | POA: Diagnosis not present

## 2023-03-04 DIAGNOSIS — M79651 Pain in right thigh: Secondary | ICD-10-CM | POA: Diagnosis not present

## 2023-03-04 DIAGNOSIS — M25561 Pain in right knee: Secondary | ICD-10-CM | POA: Diagnosis not present

## 2023-03-05 DIAGNOSIS — M6281 Muscle weakness (generalized): Secondary | ICD-10-CM | POA: Diagnosis not present

## 2023-03-05 DIAGNOSIS — E1142 Type 2 diabetes mellitus with diabetic polyneuropathy: Secondary | ICD-10-CM | POA: Diagnosis not present

## 2023-03-05 DIAGNOSIS — R2681 Unsteadiness on feet: Secondary | ICD-10-CM | POA: Diagnosis not present

## 2023-03-06 DIAGNOSIS — R1310 Dysphagia, unspecified: Secondary | ICD-10-CM | POA: Diagnosis not present

## 2023-03-06 DIAGNOSIS — R933 Abnormal findings on diagnostic imaging of other parts of digestive tract: Secondary | ICD-10-CM | POA: Diagnosis not present

## 2023-03-07 DIAGNOSIS — M6281 Muscle weakness (generalized): Secondary | ICD-10-CM | POA: Diagnosis not present

## 2023-03-07 DIAGNOSIS — E1142 Type 2 diabetes mellitus with diabetic polyneuropathy: Secondary | ICD-10-CM | POA: Diagnosis not present

## 2023-03-07 DIAGNOSIS — R2681 Unsteadiness on feet: Secondary | ICD-10-CM | POA: Diagnosis not present

## 2023-03-08 DIAGNOSIS — R2681 Unsteadiness on feet: Secondary | ICD-10-CM | POA: Diagnosis not present

## 2023-03-08 DIAGNOSIS — M6281 Muscle weakness (generalized): Secondary | ICD-10-CM | POA: Diagnosis not present

## 2023-03-08 DIAGNOSIS — E1142 Type 2 diabetes mellitus with diabetic polyneuropathy: Secondary | ICD-10-CM | POA: Diagnosis not present

## 2023-03-09 DIAGNOSIS — E1142 Type 2 diabetes mellitus with diabetic polyneuropathy: Secondary | ICD-10-CM | POA: Diagnosis not present

## 2023-03-09 DIAGNOSIS — M6281 Muscle weakness (generalized): Secondary | ICD-10-CM | POA: Diagnosis not present

## 2023-03-09 DIAGNOSIS — R2681 Unsteadiness on feet: Secondary | ICD-10-CM | POA: Diagnosis not present

## 2023-03-10 DIAGNOSIS — R2681 Unsteadiness on feet: Secondary | ICD-10-CM | POA: Diagnosis not present

## 2023-03-10 DIAGNOSIS — E1142 Type 2 diabetes mellitus with diabetic polyneuropathy: Secondary | ICD-10-CM | POA: Diagnosis not present

## 2023-03-10 DIAGNOSIS — M6281 Muscle weakness (generalized): Secondary | ICD-10-CM | POA: Diagnosis not present

## 2023-03-12 DIAGNOSIS — E1142 Type 2 diabetes mellitus with diabetic polyneuropathy: Secondary | ICD-10-CM | POA: Diagnosis not present

## 2023-03-12 DIAGNOSIS — R2681 Unsteadiness on feet: Secondary | ICD-10-CM | POA: Diagnosis not present

## 2023-03-12 DIAGNOSIS — M6281 Muscle weakness (generalized): Secondary | ICD-10-CM | POA: Diagnosis not present

## 2023-03-13 DIAGNOSIS — R2681 Unsteadiness on feet: Secondary | ICD-10-CM | POA: Diagnosis not present

## 2023-03-13 DIAGNOSIS — M6281 Muscle weakness (generalized): Secondary | ICD-10-CM | POA: Diagnosis not present

## 2023-03-13 DIAGNOSIS — E1142 Type 2 diabetes mellitus with diabetic polyneuropathy: Secondary | ICD-10-CM | POA: Diagnosis not present

## 2023-03-14 DIAGNOSIS — M6281 Muscle weakness (generalized): Secondary | ICD-10-CM | POA: Diagnosis not present

## 2023-03-14 DIAGNOSIS — R2681 Unsteadiness on feet: Secondary | ICD-10-CM | POA: Diagnosis not present

## 2023-03-14 DIAGNOSIS — E1142 Type 2 diabetes mellitus with diabetic polyneuropathy: Secondary | ICD-10-CM | POA: Diagnosis not present

## 2023-03-15 DIAGNOSIS — R2681 Unsteadiness on feet: Secondary | ICD-10-CM | POA: Diagnosis not present

## 2023-03-15 DIAGNOSIS — E1142 Type 2 diabetes mellitus with diabetic polyneuropathy: Secondary | ICD-10-CM | POA: Diagnosis not present

## 2023-03-15 DIAGNOSIS — M6281 Muscle weakness (generalized): Secondary | ICD-10-CM | POA: Diagnosis not present

## 2023-03-18 DIAGNOSIS — E1142 Type 2 diabetes mellitus with diabetic polyneuropathy: Secondary | ICD-10-CM | POA: Diagnosis not present

## 2023-03-18 DIAGNOSIS — R2681 Unsteadiness on feet: Secondary | ICD-10-CM | POA: Diagnosis not present

## 2023-03-18 DIAGNOSIS — M6281 Muscle weakness (generalized): Secondary | ICD-10-CM | POA: Diagnosis not present

## 2023-03-19 DIAGNOSIS — R051 Acute cough: Secondary | ICD-10-CM | POA: Diagnosis not present

## 2023-03-19 DIAGNOSIS — M6281 Muscle weakness (generalized): Secondary | ICD-10-CM | POA: Diagnosis not present

## 2023-03-19 DIAGNOSIS — R2681 Unsteadiness on feet: Secondary | ICD-10-CM | POA: Diagnosis not present

## 2023-03-19 DIAGNOSIS — E1142 Type 2 diabetes mellitus with diabetic polyneuropathy: Secondary | ICD-10-CM | POA: Diagnosis not present

## 2023-03-19 DIAGNOSIS — I509 Heart failure, unspecified: Secondary | ICD-10-CM | POA: Diagnosis not present

## 2023-03-20 DIAGNOSIS — M170 Bilateral primary osteoarthritis of knee: Secondary | ICD-10-CM | POA: Diagnosis not present

## 2023-03-20 DIAGNOSIS — W19XXXD Unspecified fall, subsequent encounter: Secondary | ICD-10-CM | POA: Diagnosis not present

## 2023-03-20 DIAGNOSIS — E1142 Type 2 diabetes mellitus with diabetic polyneuropathy: Secondary | ICD-10-CM | POA: Diagnosis not present

## 2023-03-20 DIAGNOSIS — R2681 Unsteadiness on feet: Secondary | ICD-10-CM | POA: Diagnosis not present

## 2023-03-20 DIAGNOSIS — M6281 Muscle weakness (generalized): Secondary | ICD-10-CM | POA: Diagnosis not present

## 2023-03-20 DIAGNOSIS — S20219A Contusion of unspecified front wall of thorax, initial encounter: Secondary | ICD-10-CM | POA: Diagnosis not present

## 2023-03-20 DIAGNOSIS — R29898 Other symptoms and signs involving the musculoskeletal system: Secondary | ICD-10-CM | POA: Diagnosis not present

## 2023-03-21 DIAGNOSIS — S40021A Contusion of right upper arm, initial encounter: Secondary | ICD-10-CM | POA: Diagnosis not present

## 2023-03-21 DIAGNOSIS — S40022A Contusion of left upper arm, initial encounter: Secondary | ICD-10-CM | POA: Diagnosis not present

## 2023-03-22 DIAGNOSIS — E119 Type 2 diabetes mellitus without complications: Secondary | ICD-10-CM | POA: Diagnosis not present

## 2023-03-25 DIAGNOSIS — R2681 Unsteadiness on feet: Secondary | ICD-10-CM | POA: Diagnosis not present

## 2023-03-25 DIAGNOSIS — M6281 Muscle weakness (generalized): Secondary | ICD-10-CM | POA: Diagnosis not present

## 2023-03-25 DIAGNOSIS — E1142 Type 2 diabetes mellitus with diabetic polyneuropathy: Secondary | ICD-10-CM | POA: Diagnosis not present

## 2023-03-26 DIAGNOSIS — R2681 Unsteadiness on feet: Secondary | ICD-10-CM | POA: Diagnosis not present

## 2023-03-26 DIAGNOSIS — S60222A Contusion of left hand, initial encounter: Secondary | ICD-10-CM | POA: Diagnosis not present

## 2023-03-26 DIAGNOSIS — M25561 Pain in right knee: Secondary | ICD-10-CM | POA: Diagnosis not present

## 2023-03-26 DIAGNOSIS — I10 Essential (primary) hypertension: Secondary | ICD-10-CM | POA: Diagnosis not present

## 2023-03-26 DIAGNOSIS — M170 Bilateral primary osteoarthritis of knee: Secondary | ICD-10-CM | POA: Diagnosis not present

## 2023-03-26 DIAGNOSIS — S60221A Contusion of right hand, initial encounter: Secondary | ICD-10-CM | POA: Diagnosis not present

## 2023-03-26 DIAGNOSIS — W19XXXD Unspecified fall, subsequent encounter: Secondary | ICD-10-CM | POA: Diagnosis not present

## 2023-03-26 DIAGNOSIS — R29898 Other symptoms and signs involving the musculoskeletal system: Secondary | ICD-10-CM | POA: Diagnosis not present

## 2023-03-26 DIAGNOSIS — E1142 Type 2 diabetes mellitus with diabetic polyneuropathy: Secondary | ICD-10-CM | POA: Diagnosis not present

## 2023-03-26 DIAGNOSIS — S40021A Contusion of right upper arm, initial encounter: Secondary | ICD-10-CM | POA: Diagnosis not present

## 2023-03-26 DIAGNOSIS — M6281 Muscle weakness (generalized): Secondary | ICD-10-CM | POA: Diagnosis not present

## 2023-03-26 DIAGNOSIS — S40022A Contusion of left upper arm, initial encounter: Secondary | ICD-10-CM | POA: Diagnosis not present

## 2023-03-27 DIAGNOSIS — M79642 Pain in left hand: Secondary | ICD-10-CM | POA: Diagnosis not present

## 2023-03-27 DIAGNOSIS — M25532 Pain in left wrist: Secondary | ICD-10-CM | POA: Diagnosis not present

## 2023-03-27 DIAGNOSIS — M79641 Pain in right hand: Secondary | ICD-10-CM | POA: Diagnosis not present

## 2023-03-27 DIAGNOSIS — M25531 Pain in right wrist: Secondary | ICD-10-CM | POA: Diagnosis not present

## 2023-03-28 DIAGNOSIS — E1142 Type 2 diabetes mellitus with diabetic polyneuropathy: Secondary | ICD-10-CM | POA: Diagnosis not present

## 2023-03-28 DIAGNOSIS — R2681 Unsteadiness on feet: Secondary | ICD-10-CM | POA: Diagnosis not present

## 2023-03-28 DIAGNOSIS — M6281 Muscle weakness (generalized): Secondary | ICD-10-CM | POA: Diagnosis not present

## 2023-03-29 DIAGNOSIS — R609 Edema, unspecified: Secondary | ICD-10-CM | POA: Diagnosis not present

## 2023-04-01 DIAGNOSIS — M6281 Muscle weakness (generalized): Secondary | ICD-10-CM | POA: Diagnosis not present

## 2023-04-01 DIAGNOSIS — R2681 Unsteadiness on feet: Secondary | ICD-10-CM | POA: Diagnosis not present

## 2023-04-01 DIAGNOSIS — E1142 Type 2 diabetes mellitus with diabetic polyneuropathy: Secondary | ICD-10-CM | POA: Diagnosis not present

## 2023-04-04 DIAGNOSIS — M6281 Muscle weakness (generalized): Secondary | ICD-10-CM | POA: Diagnosis not present

## 2023-04-04 DIAGNOSIS — R2681 Unsteadiness on feet: Secondary | ICD-10-CM | POA: Diagnosis not present

## 2023-04-04 DIAGNOSIS — E1142 Type 2 diabetes mellitus with diabetic polyneuropathy: Secondary | ICD-10-CM | POA: Diagnosis not present

## 2023-04-05 DIAGNOSIS — M6281 Muscle weakness (generalized): Secondary | ICD-10-CM | POA: Diagnosis not present

## 2023-04-05 DIAGNOSIS — R2681 Unsteadiness on feet: Secondary | ICD-10-CM | POA: Diagnosis not present

## 2023-04-05 DIAGNOSIS — E1142 Type 2 diabetes mellitus with diabetic polyneuropathy: Secondary | ICD-10-CM | POA: Diagnosis not present

## 2023-04-08 DIAGNOSIS — E1142 Type 2 diabetes mellitus with diabetic polyneuropathy: Secondary | ICD-10-CM | POA: Diagnosis not present

## 2023-04-08 DIAGNOSIS — R2681 Unsteadiness on feet: Secondary | ICD-10-CM | POA: Diagnosis not present

## 2023-04-08 DIAGNOSIS — M6281 Muscle weakness (generalized): Secondary | ICD-10-CM | POA: Diagnosis not present

## 2023-04-09 DIAGNOSIS — R531 Weakness: Secondary | ICD-10-CM | POA: Diagnosis not present

## 2023-04-09 DIAGNOSIS — R5381 Other malaise: Secondary | ICD-10-CM | POA: Diagnosis not present

## 2023-04-10 DIAGNOSIS — E559 Vitamin D deficiency, unspecified: Secondary | ICD-10-CM | POA: Diagnosis not present

## 2023-04-10 DIAGNOSIS — E119 Type 2 diabetes mellitus without complications: Secondary | ICD-10-CM | POA: Diagnosis not present

## 2023-04-10 DIAGNOSIS — M6281 Muscle weakness (generalized): Secondary | ICD-10-CM | POA: Diagnosis not present

## 2023-04-10 DIAGNOSIS — E1142 Type 2 diabetes mellitus with diabetic polyneuropathy: Secondary | ICD-10-CM | POA: Diagnosis not present

## 2023-04-10 DIAGNOSIS — D649 Anemia, unspecified: Secondary | ICD-10-CM | POA: Diagnosis not present

## 2023-04-10 DIAGNOSIS — R2681 Unsteadiness on feet: Secondary | ICD-10-CM | POA: Diagnosis not present

## 2023-04-10 DIAGNOSIS — I10 Essential (primary) hypertension: Secondary | ICD-10-CM | POA: Diagnosis not present

## 2023-04-11 DIAGNOSIS — M6281 Muscle weakness (generalized): Secondary | ICD-10-CM | POA: Diagnosis not present

## 2023-04-11 DIAGNOSIS — E1142 Type 2 diabetes mellitus with diabetic polyneuropathy: Secondary | ICD-10-CM | POA: Diagnosis not present

## 2023-04-11 DIAGNOSIS — R2681 Unsteadiness on feet: Secondary | ICD-10-CM | POA: Diagnosis not present

## 2023-04-12 DIAGNOSIS — E1122 Type 2 diabetes mellitus with diabetic chronic kidney disease: Secondary | ICD-10-CM | POA: Diagnosis not present

## 2023-04-12 DIAGNOSIS — S60222A Contusion of left hand, initial encounter: Secondary | ICD-10-CM | POA: Diagnosis not present

## 2023-04-12 DIAGNOSIS — E1142 Type 2 diabetes mellitus with diabetic polyneuropathy: Secondary | ICD-10-CM | POA: Diagnosis not present

## 2023-04-12 DIAGNOSIS — R2681 Unsteadiness on feet: Secondary | ICD-10-CM | POA: Diagnosis not present

## 2023-04-12 DIAGNOSIS — I509 Heart failure, unspecified: Secondary | ICD-10-CM | POA: Diagnosis not present

## 2023-04-12 DIAGNOSIS — S60221A Contusion of right hand, initial encounter: Secondary | ICD-10-CM | POA: Diagnosis not present

## 2023-04-12 DIAGNOSIS — I130 Hypertensive heart and chronic kidney disease with heart failure and stage 1 through stage 4 chronic kidney disease, or unspecified chronic kidney disease: Secondary | ICD-10-CM | POA: Diagnosis not present

## 2023-04-12 DIAGNOSIS — H6123 Impacted cerumen, bilateral: Secondary | ICD-10-CM | POA: Diagnosis not present

## 2023-04-12 DIAGNOSIS — M6281 Muscle weakness (generalized): Secondary | ICD-10-CM | POA: Diagnosis not present

## 2023-04-12 DIAGNOSIS — G8929 Other chronic pain: Secondary | ICD-10-CM | POA: Diagnosis not present

## 2023-04-12 DIAGNOSIS — Z7984 Long term (current) use of oral hypoglycemic drugs: Secondary | ICD-10-CM | POA: Diagnosis not present

## 2023-04-12 DIAGNOSIS — R29898 Other symptoms and signs involving the musculoskeletal system: Secondary | ICD-10-CM | POA: Diagnosis not present

## 2023-04-12 DIAGNOSIS — M170 Bilateral primary osteoarthritis of knee: Secondary | ICD-10-CM | POA: Diagnosis not present

## 2023-04-12 DIAGNOSIS — N183 Chronic kidney disease, stage 3 unspecified: Secondary | ICD-10-CM | POA: Diagnosis not present

## 2023-04-13 DIAGNOSIS — R2681 Unsteadiness on feet: Secondary | ICD-10-CM | POA: Diagnosis not present

## 2023-04-13 DIAGNOSIS — M6281 Muscle weakness (generalized): Secondary | ICD-10-CM | POA: Diagnosis not present

## 2023-04-13 DIAGNOSIS — E1142 Type 2 diabetes mellitus with diabetic polyneuropathy: Secondary | ICD-10-CM | POA: Diagnosis not present

## 2023-04-15 DIAGNOSIS — R5381 Other malaise: Secondary | ICD-10-CM | POA: Diagnosis not present

## 2023-04-15 DIAGNOSIS — E1142 Type 2 diabetes mellitus with diabetic polyneuropathy: Secondary | ICD-10-CM | POA: Diagnosis not present

## 2023-04-15 DIAGNOSIS — R2681 Unsteadiness on feet: Secondary | ICD-10-CM | POA: Diagnosis not present

## 2023-04-15 DIAGNOSIS — M6281 Muscle weakness (generalized): Secondary | ICD-10-CM | POA: Diagnosis not present

## 2023-04-15 DIAGNOSIS — R531 Weakness: Secondary | ICD-10-CM | POA: Diagnosis not present

## 2023-04-16 DIAGNOSIS — R2681 Unsteadiness on feet: Secondary | ICD-10-CM | POA: Diagnosis not present

## 2023-04-16 DIAGNOSIS — M6281 Muscle weakness (generalized): Secondary | ICD-10-CM | POA: Diagnosis not present

## 2023-04-16 DIAGNOSIS — E1142 Type 2 diabetes mellitus with diabetic polyneuropathy: Secondary | ICD-10-CM | POA: Diagnosis not present

## 2023-04-17 DIAGNOSIS — E1142 Type 2 diabetes mellitus with diabetic polyneuropathy: Secondary | ICD-10-CM | POA: Diagnosis not present

## 2023-04-17 DIAGNOSIS — R2681 Unsteadiness on feet: Secondary | ICD-10-CM | POA: Diagnosis not present

## 2023-04-17 DIAGNOSIS — M6281 Muscle weakness (generalized): Secondary | ICD-10-CM | POA: Diagnosis not present

## 2023-04-18 DIAGNOSIS — E1142 Type 2 diabetes mellitus with diabetic polyneuropathy: Secondary | ICD-10-CM | POA: Diagnosis not present

## 2023-04-18 DIAGNOSIS — M6281 Muscle weakness (generalized): Secondary | ICD-10-CM | POA: Diagnosis not present

## 2023-04-18 DIAGNOSIS — R2681 Unsteadiness on feet: Secondary | ICD-10-CM | POA: Diagnosis not present

## 2023-04-19 DIAGNOSIS — M6281 Muscle weakness (generalized): Secondary | ICD-10-CM | POA: Diagnosis not present

## 2023-04-19 DIAGNOSIS — R2681 Unsteadiness on feet: Secondary | ICD-10-CM | POA: Diagnosis not present

## 2023-04-19 DIAGNOSIS — E1142 Type 2 diabetes mellitus with diabetic polyneuropathy: Secondary | ICD-10-CM | POA: Diagnosis not present

## 2023-04-22 DIAGNOSIS — R2681 Unsteadiness on feet: Secondary | ICD-10-CM | POA: Diagnosis not present

## 2023-04-22 DIAGNOSIS — M6281 Muscle weakness (generalized): Secondary | ICD-10-CM | POA: Diagnosis not present

## 2023-04-22 DIAGNOSIS — E1142 Type 2 diabetes mellitus with diabetic polyneuropathy: Secondary | ICD-10-CM | POA: Diagnosis not present

## 2023-04-23 DIAGNOSIS — M6281 Muscle weakness (generalized): Secondary | ICD-10-CM | POA: Diagnosis not present

## 2023-04-23 DIAGNOSIS — R2681 Unsteadiness on feet: Secondary | ICD-10-CM | POA: Diagnosis not present

## 2023-04-23 DIAGNOSIS — E1142 Type 2 diabetes mellitus with diabetic polyneuropathy: Secondary | ICD-10-CM | POA: Diagnosis not present

## 2023-04-24 DIAGNOSIS — E1142 Type 2 diabetes mellitus with diabetic polyneuropathy: Secondary | ICD-10-CM | POA: Diagnosis not present

## 2023-04-24 DIAGNOSIS — R2681 Unsteadiness on feet: Secondary | ICD-10-CM | POA: Diagnosis not present

## 2023-04-24 DIAGNOSIS — M6281 Muscle weakness (generalized): Secondary | ICD-10-CM | POA: Diagnosis not present

## 2023-04-26 DIAGNOSIS — R2681 Unsteadiness on feet: Secondary | ICD-10-CM | POA: Diagnosis not present

## 2023-04-26 DIAGNOSIS — M6281 Muscle weakness (generalized): Secondary | ICD-10-CM | POA: Diagnosis not present

## 2023-04-26 DIAGNOSIS — E1142 Type 2 diabetes mellitus with diabetic polyneuropathy: Secondary | ICD-10-CM | POA: Diagnosis not present

## 2023-04-27 DIAGNOSIS — M6281 Muscle weakness (generalized): Secondary | ICD-10-CM | POA: Diagnosis not present

## 2023-04-27 DIAGNOSIS — R2681 Unsteadiness on feet: Secondary | ICD-10-CM | POA: Diagnosis not present

## 2023-04-27 DIAGNOSIS — E1142 Type 2 diabetes mellitus with diabetic polyneuropathy: Secondary | ICD-10-CM | POA: Diagnosis not present

## 2023-04-29 DIAGNOSIS — E1142 Type 2 diabetes mellitus with diabetic polyneuropathy: Secondary | ICD-10-CM | POA: Diagnosis not present

## 2023-04-29 DIAGNOSIS — M6281 Muscle weakness (generalized): Secondary | ICD-10-CM | POA: Diagnosis not present

## 2023-04-29 DIAGNOSIS — R2681 Unsteadiness on feet: Secondary | ICD-10-CM | POA: Diagnosis not present

## 2023-04-30 DIAGNOSIS — M6281 Muscle weakness (generalized): Secondary | ICD-10-CM | POA: Diagnosis not present

## 2023-04-30 DIAGNOSIS — E1142 Type 2 diabetes mellitus with diabetic polyneuropathy: Secondary | ICD-10-CM | POA: Diagnosis not present

## 2023-04-30 DIAGNOSIS — R2681 Unsteadiness on feet: Secondary | ICD-10-CM | POA: Diagnosis not present

## 2023-05-01 DIAGNOSIS — M6281 Muscle weakness (generalized): Secondary | ICD-10-CM | POA: Diagnosis not present

## 2023-05-01 DIAGNOSIS — R2681 Unsteadiness on feet: Secondary | ICD-10-CM | POA: Diagnosis not present

## 2023-05-01 DIAGNOSIS — E1142 Type 2 diabetes mellitus with diabetic polyneuropathy: Secondary | ICD-10-CM | POA: Diagnosis not present

## 2023-05-02 DIAGNOSIS — M6281 Muscle weakness (generalized): Secondary | ICD-10-CM | POA: Diagnosis not present

## 2023-05-02 DIAGNOSIS — R2681 Unsteadiness on feet: Secondary | ICD-10-CM | POA: Diagnosis not present

## 2023-05-02 DIAGNOSIS — E1142 Type 2 diabetes mellitus with diabetic polyneuropathy: Secondary | ICD-10-CM | POA: Diagnosis not present

## 2023-05-03 DIAGNOSIS — M6281 Muscle weakness (generalized): Secondary | ICD-10-CM | POA: Diagnosis not present

## 2023-05-03 DIAGNOSIS — E1142 Type 2 diabetes mellitus with diabetic polyneuropathy: Secondary | ICD-10-CM | POA: Diagnosis not present

## 2023-05-03 DIAGNOSIS — R2681 Unsteadiness on feet: Secondary | ICD-10-CM | POA: Diagnosis not present

## 2023-05-04 DIAGNOSIS — E1142 Type 2 diabetes mellitus with diabetic polyneuropathy: Secondary | ICD-10-CM | POA: Diagnosis not present

## 2023-05-04 DIAGNOSIS — M6281 Muscle weakness (generalized): Secondary | ICD-10-CM | POA: Diagnosis not present

## 2023-05-04 DIAGNOSIS — R2681 Unsteadiness on feet: Secondary | ICD-10-CM | POA: Diagnosis not present

## 2023-05-06 DIAGNOSIS — M6281 Muscle weakness (generalized): Secondary | ICD-10-CM | POA: Diagnosis not present

## 2023-05-06 DIAGNOSIS — E1142 Type 2 diabetes mellitus with diabetic polyneuropathy: Secondary | ICD-10-CM | POA: Diagnosis not present

## 2023-05-06 DIAGNOSIS — R2681 Unsteadiness on feet: Secondary | ICD-10-CM | POA: Diagnosis not present

## 2023-05-07 DIAGNOSIS — E1142 Type 2 diabetes mellitus with diabetic polyneuropathy: Secondary | ICD-10-CM | POA: Diagnosis not present

## 2023-05-07 DIAGNOSIS — M6281 Muscle weakness (generalized): Secondary | ICD-10-CM | POA: Diagnosis not present

## 2023-05-07 DIAGNOSIS — R2681 Unsteadiness on feet: Secondary | ICD-10-CM | POA: Diagnosis not present

## 2023-05-08 DIAGNOSIS — E1142 Type 2 diabetes mellitus with diabetic polyneuropathy: Secondary | ICD-10-CM | POA: Diagnosis not present

## 2023-05-08 DIAGNOSIS — M6281 Muscle weakness (generalized): Secondary | ICD-10-CM | POA: Diagnosis not present

## 2023-05-08 DIAGNOSIS — R2681 Unsteadiness on feet: Secondary | ICD-10-CM | POA: Diagnosis not present

## 2023-05-09 DIAGNOSIS — E1142 Type 2 diabetes mellitus with diabetic polyneuropathy: Secondary | ICD-10-CM | POA: Diagnosis not present

## 2023-05-09 DIAGNOSIS — M6281 Muscle weakness (generalized): Secondary | ICD-10-CM | POA: Diagnosis not present

## 2023-05-09 DIAGNOSIS — R2681 Unsteadiness on feet: Secondary | ICD-10-CM | POA: Diagnosis not present

## 2023-05-10 DIAGNOSIS — M6281 Muscle weakness (generalized): Secondary | ICD-10-CM | POA: Diagnosis not present

## 2023-05-10 DIAGNOSIS — E1142 Type 2 diabetes mellitus with diabetic polyneuropathy: Secondary | ICD-10-CM | POA: Diagnosis not present

## 2023-05-10 DIAGNOSIS — R2681 Unsteadiness on feet: Secondary | ICD-10-CM | POA: Diagnosis not present

## 2023-05-13 DIAGNOSIS — R2681 Unsteadiness on feet: Secondary | ICD-10-CM | POA: Diagnosis not present

## 2023-05-13 DIAGNOSIS — E1142 Type 2 diabetes mellitus with diabetic polyneuropathy: Secondary | ICD-10-CM | POA: Diagnosis not present

## 2023-05-13 DIAGNOSIS — M6281 Muscle weakness (generalized): Secondary | ICD-10-CM | POA: Diagnosis not present

## 2023-05-14 DIAGNOSIS — R2681 Unsteadiness on feet: Secondary | ICD-10-CM | POA: Diagnosis not present

## 2023-05-14 DIAGNOSIS — E1142 Type 2 diabetes mellitus with diabetic polyneuropathy: Secondary | ICD-10-CM | POA: Diagnosis not present

## 2023-05-14 DIAGNOSIS — M6281 Muscle weakness (generalized): Secondary | ICD-10-CM | POA: Diagnosis not present

## 2023-05-15 DIAGNOSIS — M6281 Muscle weakness (generalized): Secondary | ICD-10-CM | POA: Diagnosis not present

## 2023-05-15 DIAGNOSIS — E1142 Type 2 diabetes mellitus with diabetic polyneuropathy: Secondary | ICD-10-CM | POA: Diagnosis not present

## 2023-05-15 DIAGNOSIS — R2681 Unsteadiness on feet: Secondary | ICD-10-CM | POA: Diagnosis not present

## 2023-05-16 DIAGNOSIS — E1142 Type 2 diabetes mellitus with diabetic polyneuropathy: Secondary | ICD-10-CM | POA: Diagnosis not present

## 2023-05-16 DIAGNOSIS — R2681 Unsteadiness on feet: Secondary | ICD-10-CM | POA: Diagnosis not present

## 2023-05-16 DIAGNOSIS — M6281 Muscle weakness (generalized): Secondary | ICD-10-CM | POA: Diagnosis not present

## 2023-05-17 DIAGNOSIS — E1142 Type 2 diabetes mellitus with diabetic polyneuropathy: Secondary | ICD-10-CM | POA: Diagnosis not present

## 2023-05-17 DIAGNOSIS — M6281 Muscle weakness (generalized): Secondary | ICD-10-CM | POA: Diagnosis not present

## 2023-05-17 DIAGNOSIS — R2681 Unsteadiness on feet: Secondary | ICD-10-CM | POA: Diagnosis not present

## 2023-05-20 DIAGNOSIS — R2681 Unsteadiness on feet: Secondary | ICD-10-CM | POA: Diagnosis not present

## 2023-05-20 DIAGNOSIS — E1142 Type 2 diabetes mellitus with diabetic polyneuropathy: Secondary | ICD-10-CM | POA: Diagnosis not present

## 2023-05-20 DIAGNOSIS — M6281 Muscle weakness (generalized): Secondary | ICD-10-CM | POA: Diagnosis not present

## 2023-05-21 DIAGNOSIS — R2681 Unsteadiness on feet: Secondary | ICD-10-CM | POA: Diagnosis not present

## 2023-05-21 DIAGNOSIS — E1142 Type 2 diabetes mellitus with diabetic polyneuropathy: Secondary | ICD-10-CM | POA: Diagnosis not present

## 2023-05-21 DIAGNOSIS — M6281 Muscle weakness (generalized): Secondary | ICD-10-CM | POA: Diagnosis not present

## 2023-05-22 DIAGNOSIS — R2681 Unsteadiness on feet: Secondary | ICD-10-CM | POA: Diagnosis not present

## 2023-05-22 DIAGNOSIS — M6281 Muscle weakness (generalized): Secondary | ICD-10-CM | POA: Diagnosis not present

## 2023-05-22 DIAGNOSIS — E1142 Type 2 diabetes mellitus with diabetic polyneuropathy: Secondary | ICD-10-CM | POA: Diagnosis not present

## 2023-05-23 DIAGNOSIS — E1142 Type 2 diabetes mellitus with diabetic polyneuropathy: Secondary | ICD-10-CM | POA: Diagnosis not present

## 2023-05-23 DIAGNOSIS — M6281 Muscle weakness (generalized): Secondary | ICD-10-CM | POA: Diagnosis not present

## 2023-05-23 DIAGNOSIS — R2681 Unsteadiness on feet: Secondary | ICD-10-CM | POA: Diagnosis not present

## 2023-05-24 DIAGNOSIS — R2681 Unsteadiness on feet: Secondary | ICD-10-CM | POA: Diagnosis not present

## 2023-05-24 DIAGNOSIS — N183 Chronic kidney disease, stage 3 unspecified: Secondary | ICD-10-CM | POA: Diagnosis not present

## 2023-05-24 DIAGNOSIS — M79644 Pain in right finger(s): Secondary | ICD-10-CM | POA: Diagnosis not present

## 2023-05-24 DIAGNOSIS — Z7189 Other specified counseling: Secondary | ICD-10-CM | POA: Diagnosis not present

## 2023-05-24 DIAGNOSIS — E1142 Type 2 diabetes mellitus with diabetic polyneuropathy: Secondary | ICD-10-CM | POA: Diagnosis not present

## 2023-05-24 DIAGNOSIS — I110 Hypertensive heart disease with heart failure: Secondary | ICD-10-CM | POA: Diagnosis not present

## 2023-05-24 DIAGNOSIS — M6281 Muscle weakness (generalized): Secondary | ICD-10-CM | POA: Diagnosis not present

## 2023-05-24 DIAGNOSIS — I509 Heart failure, unspecified: Secondary | ICD-10-CM | POA: Diagnosis not present

## 2023-05-24 DIAGNOSIS — R5381 Other malaise: Secondary | ICD-10-CM | POA: Diagnosis not present

## 2023-05-24 DIAGNOSIS — M170 Bilateral primary osteoarthritis of knee: Secondary | ICD-10-CM | POA: Diagnosis not present

## 2023-05-24 DIAGNOSIS — R634 Abnormal weight loss: Secondary | ICD-10-CM | POA: Diagnosis not present

## 2023-05-24 DIAGNOSIS — E1122 Type 2 diabetes mellitus with diabetic chronic kidney disease: Secondary | ICD-10-CM | POA: Diagnosis not present

## 2023-05-24 DIAGNOSIS — H6123 Impacted cerumen, bilateral: Secondary | ICD-10-CM | POA: Diagnosis not present

## 2023-05-24 DIAGNOSIS — R29898 Other symptoms and signs involving the musculoskeletal system: Secondary | ICD-10-CM | POA: Diagnosis not present

## 2023-05-24 DIAGNOSIS — M79645 Pain in left finger(s): Secondary | ICD-10-CM | POA: Diagnosis not present

## 2023-05-27 DIAGNOSIS — E1142 Type 2 diabetes mellitus with diabetic polyneuropathy: Secondary | ICD-10-CM | POA: Diagnosis not present

## 2023-05-27 DIAGNOSIS — R2681 Unsteadiness on feet: Secondary | ICD-10-CM | POA: Diagnosis not present

## 2023-05-27 DIAGNOSIS — M6281 Muscle weakness (generalized): Secondary | ICD-10-CM | POA: Diagnosis not present

## 2023-05-28 DIAGNOSIS — R2681 Unsteadiness on feet: Secondary | ICD-10-CM | POA: Diagnosis not present

## 2023-05-28 DIAGNOSIS — E1142 Type 2 diabetes mellitus with diabetic polyneuropathy: Secondary | ICD-10-CM | POA: Diagnosis not present

## 2023-05-28 DIAGNOSIS — M6281 Muscle weakness (generalized): Secondary | ICD-10-CM | POA: Diagnosis not present

## 2023-05-29 DIAGNOSIS — E1142 Type 2 diabetes mellitus with diabetic polyneuropathy: Secondary | ICD-10-CM | POA: Diagnosis not present

## 2023-05-29 DIAGNOSIS — R2681 Unsteadiness on feet: Secondary | ICD-10-CM | POA: Diagnosis not present

## 2023-05-29 DIAGNOSIS — M6281 Muscle weakness (generalized): Secondary | ICD-10-CM | POA: Diagnosis not present

## 2023-05-30 DIAGNOSIS — M6281 Muscle weakness (generalized): Secondary | ICD-10-CM | POA: Diagnosis not present

## 2023-05-30 DIAGNOSIS — R2681 Unsteadiness on feet: Secondary | ICD-10-CM | POA: Diagnosis not present

## 2023-05-30 DIAGNOSIS — E1142 Type 2 diabetes mellitus with diabetic polyneuropathy: Secondary | ICD-10-CM | POA: Diagnosis not present

## 2023-05-31 DIAGNOSIS — E1142 Type 2 diabetes mellitus with diabetic polyneuropathy: Secondary | ICD-10-CM | POA: Diagnosis not present

## 2023-05-31 DIAGNOSIS — M6281 Muscle weakness (generalized): Secondary | ICD-10-CM | POA: Diagnosis not present

## 2023-05-31 DIAGNOSIS — R2681 Unsteadiness on feet: Secondary | ICD-10-CM | POA: Diagnosis not present

## 2023-06-03 DIAGNOSIS — E785 Hyperlipidemia, unspecified: Secondary | ICD-10-CM | POA: Diagnosis not present

## 2023-06-03 DIAGNOSIS — I509 Heart failure, unspecified: Secondary | ICD-10-CM | POA: Diagnosis not present

## 2023-06-03 DIAGNOSIS — R5381 Other malaise: Secondary | ICD-10-CM | POA: Diagnosis not present

## 2023-06-03 DIAGNOSIS — M170 Bilateral primary osteoarthritis of knee: Secondary | ICD-10-CM | POA: Diagnosis not present

## 2023-06-03 DIAGNOSIS — R2681 Unsteadiness on feet: Secondary | ICD-10-CM | POA: Diagnosis not present

## 2023-06-03 DIAGNOSIS — I110 Hypertensive heart disease with heart failure: Secondary | ICD-10-CM | POA: Diagnosis not present

## 2023-06-03 DIAGNOSIS — M6281 Muscle weakness (generalized): Secondary | ICD-10-CM | POA: Diagnosis not present

## 2023-06-03 DIAGNOSIS — R29898 Other symptoms and signs involving the musculoskeletal system: Secondary | ICD-10-CM | POA: Diagnosis not present

## 2023-06-03 DIAGNOSIS — N183 Chronic kidney disease, stage 3 unspecified: Secondary | ICD-10-CM | POA: Diagnosis not present

## 2023-06-03 DIAGNOSIS — E1142 Type 2 diabetes mellitus with diabetic polyneuropathy: Secondary | ICD-10-CM | POA: Diagnosis not present

## 2023-06-03 DIAGNOSIS — E1122 Type 2 diabetes mellitus with diabetic chronic kidney disease: Secondary | ICD-10-CM | POA: Diagnosis not present

## 2023-06-03 DIAGNOSIS — M519 Unspecified thoracic, thoracolumbar and lumbosacral intervertebral disc disorder: Secondary | ICD-10-CM | POA: Diagnosis not present

## 2023-06-03 DIAGNOSIS — M810 Age-related osteoporosis without current pathological fracture: Secondary | ICD-10-CM | POA: Diagnosis not present

## 2023-06-03 DIAGNOSIS — R634 Abnormal weight loss: Secondary | ICD-10-CM | POA: Diagnosis not present

## 2023-06-04 DIAGNOSIS — E1142 Type 2 diabetes mellitus with diabetic polyneuropathy: Secondary | ICD-10-CM | POA: Diagnosis not present

## 2023-06-04 DIAGNOSIS — M6281 Muscle weakness (generalized): Secondary | ICD-10-CM | POA: Diagnosis not present

## 2023-06-04 DIAGNOSIS — R2681 Unsteadiness on feet: Secondary | ICD-10-CM | POA: Diagnosis not present

## 2023-06-05 DIAGNOSIS — R2681 Unsteadiness on feet: Secondary | ICD-10-CM | POA: Diagnosis not present

## 2023-06-05 DIAGNOSIS — E1142 Type 2 diabetes mellitus with diabetic polyneuropathy: Secondary | ICD-10-CM | POA: Diagnosis not present

## 2023-06-05 DIAGNOSIS — M6281 Muscle weakness (generalized): Secondary | ICD-10-CM | POA: Diagnosis not present

## 2023-06-08 DIAGNOSIS — R2681 Unsteadiness on feet: Secondary | ICD-10-CM | POA: Diagnosis not present

## 2023-06-08 DIAGNOSIS — M6281 Muscle weakness (generalized): Secondary | ICD-10-CM | POA: Diagnosis not present

## 2023-06-08 DIAGNOSIS — E1142 Type 2 diabetes mellitus with diabetic polyneuropathy: Secondary | ICD-10-CM | POA: Diagnosis not present

## 2023-06-18 DIAGNOSIS — L603 Nail dystrophy: Secondary | ICD-10-CM | POA: Diagnosis not present

## 2023-06-18 DIAGNOSIS — L602 Onychogryphosis: Secondary | ICD-10-CM | POA: Diagnosis not present

## 2023-06-18 DIAGNOSIS — Z794 Long term (current) use of insulin: Secondary | ICD-10-CM | POA: Diagnosis not present

## 2023-06-18 DIAGNOSIS — E1151 Type 2 diabetes mellitus with diabetic peripheral angiopathy without gangrene: Secondary | ICD-10-CM | POA: Diagnosis not present

## 2023-06-21 DIAGNOSIS — E119 Type 2 diabetes mellitus without complications: Secondary | ICD-10-CM | POA: Diagnosis not present

## 2023-06-25 DIAGNOSIS — R5381 Other malaise: Secondary | ICD-10-CM | POA: Diagnosis not present

## 2023-06-25 DIAGNOSIS — R197 Diarrhea, unspecified: Secondary | ICD-10-CM | POA: Diagnosis not present

## 2023-06-27 DIAGNOSIS — R110 Nausea: Secondary | ICD-10-CM | POA: Diagnosis not present

## 2023-06-27 DIAGNOSIS — I959 Hypotension, unspecified: Secondary | ICD-10-CM | POA: Diagnosis not present

## 2023-07-08 DIAGNOSIS — M810 Age-related osteoporosis without current pathological fracture: Secondary | ICD-10-CM | POA: Diagnosis not present

## 2023-07-08 DIAGNOSIS — Z9181 History of falling: Secondary | ICD-10-CM | POA: Diagnosis not present

## 2023-07-08 DIAGNOSIS — M6259 Muscle wasting and atrophy, not elsewhere classified, multiple sites: Secondary | ICD-10-CM | POA: Diagnosis not present

## 2023-07-08 DIAGNOSIS — M545 Low back pain, unspecified: Secondary | ICD-10-CM | POA: Diagnosis not present

## 2023-07-09 DIAGNOSIS — Z9181 History of falling: Secondary | ICD-10-CM | POA: Diagnosis not present

## 2023-07-09 DIAGNOSIS — M810 Age-related osteoporosis without current pathological fracture: Secondary | ICD-10-CM | POA: Diagnosis not present

## 2023-07-09 DIAGNOSIS — M6259 Muscle wasting and atrophy, not elsewhere classified, multiple sites: Secondary | ICD-10-CM | POA: Diagnosis not present

## 2023-07-09 DIAGNOSIS — M545 Low back pain, unspecified: Secondary | ICD-10-CM | POA: Diagnosis not present

## 2023-07-10 DIAGNOSIS — M810 Age-related osteoporosis without current pathological fracture: Secondary | ICD-10-CM | POA: Diagnosis not present

## 2023-07-10 DIAGNOSIS — Z9181 History of falling: Secondary | ICD-10-CM | POA: Diagnosis not present

## 2023-07-10 DIAGNOSIS — M545 Low back pain, unspecified: Secondary | ICD-10-CM | POA: Diagnosis not present

## 2023-07-10 DIAGNOSIS — M6259 Muscle wasting and atrophy, not elsewhere classified, multiple sites: Secondary | ICD-10-CM | POA: Diagnosis not present

## 2023-07-11 DIAGNOSIS — M545 Low back pain, unspecified: Secondary | ICD-10-CM | POA: Diagnosis not present

## 2023-07-11 DIAGNOSIS — M6259 Muscle wasting and atrophy, not elsewhere classified, multiple sites: Secondary | ICD-10-CM | POA: Diagnosis not present

## 2023-07-11 DIAGNOSIS — Z9181 History of falling: Secondary | ICD-10-CM | POA: Diagnosis not present

## 2023-07-11 DIAGNOSIS — M810 Age-related osteoporosis without current pathological fracture: Secondary | ICD-10-CM | POA: Diagnosis not present

## 2023-07-12 DIAGNOSIS — Z9181 History of falling: Secondary | ICD-10-CM | POA: Diagnosis not present

## 2023-07-12 DIAGNOSIS — M810 Age-related osteoporosis without current pathological fracture: Secondary | ICD-10-CM | POA: Diagnosis not present

## 2023-07-12 DIAGNOSIS — M545 Low back pain, unspecified: Secondary | ICD-10-CM | POA: Diagnosis not present

## 2023-07-12 DIAGNOSIS — M6259 Muscle wasting and atrophy, not elsewhere classified, multiple sites: Secondary | ICD-10-CM | POA: Diagnosis not present

## 2023-07-15 DIAGNOSIS — M6259 Muscle wasting and atrophy, not elsewhere classified, multiple sites: Secondary | ICD-10-CM | POA: Diagnosis not present

## 2023-07-15 DIAGNOSIS — M545 Low back pain, unspecified: Secondary | ICD-10-CM | POA: Diagnosis not present

## 2023-07-15 DIAGNOSIS — M810 Age-related osteoporosis without current pathological fracture: Secondary | ICD-10-CM | POA: Diagnosis not present

## 2023-07-15 DIAGNOSIS — Z23 Encounter for immunization: Secondary | ICD-10-CM | POA: Diagnosis not present

## 2023-07-15 DIAGNOSIS — Z9181 History of falling: Secondary | ICD-10-CM | POA: Diagnosis not present

## 2023-07-16 DIAGNOSIS — M810 Age-related osteoporosis without current pathological fracture: Secondary | ICD-10-CM | POA: Diagnosis not present

## 2023-07-16 DIAGNOSIS — M6259 Muscle wasting and atrophy, not elsewhere classified, multiple sites: Secondary | ICD-10-CM | POA: Diagnosis not present

## 2023-07-16 DIAGNOSIS — Z9181 History of falling: Secondary | ICD-10-CM | POA: Diagnosis not present

## 2023-07-16 DIAGNOSIS — M545 Low back pain, unspecified: Secondary | ICD-10-CM | POA: Diagnosis not present

## 2023-07-17 DIAGNOSIS — Z9181 History of falling: Secondary | ICD-10-CM | POA: Diagnosis not present

## 2023-07-17 DIAGNOSIS — M545 Low back pain, unspecified: Secondary | ICD-10-CM | POA: Diagnosis not present

## 2023-07-17 DIAGNOSIS — M6259 Muscle wasting and atrophy, not elsewhere classified, multiple sites: Secondary | ICD-10-CM | POA: Diagnosis not present

## 2023-07-17 DIAGNOSIS — M810 Age-related osteoporosis without current pathological fracture: Secondary | ICD-10-CM | POA: Diagnosis not present

## 2023-07-18 DIAGNOSIS — Z9181 History of falling: Secondary | ICD-10-CM | POA: Diagnosis not present

## 2023-07-18 DIAGNOSIS — M545 Low back pain, unspecified: Secondary | ICD-10-CM | POA: Diagnosis not present

## 2023-07-18 DIAGNOSIS — M810 Age-related osteoporosis without current pathological fracture: Secondary | ICD-10-CM | POA: Diagnosis not present

## 2023-07-18 DIAGNOSIS — H47231 Glaucomatous optic atrophy, right eye: Secondary | ICD-10-CM | POA: Diagnosis not present

## 2023-07-18 DIAGNOSIS — M6259 Muscle wasting and atrophy, not elsewhere classified, multiple sites: Secondary | ICD-10-CM | POA: Diagnosis not present

## 2023-07-22 DIAGNOSIS — M6259 Muscle wasting and atrophy, not elsewhere classified, multiple sites: Secondary | ICD-10-CM | POA: Diagnosis not present

## 2023-07-22 DIAGNOSIS — M545 Low back pain, unspecified: Secondary | ICD-10-CM | POA: Diagnosis not present

## 2023-07-22 DIAGNOSIS — Z9181 History of falling: Secondary | ICD-10-CM | POA: Diagnosis not present

## 2023-07-22 DIAGNOSIS — M810 Age-related osteoporosis without current pathological fracture: Secondary | ICD-10-CM | POA: Diagnosis not present

## 2023-07-23 DIAGNOSIS — M810 Age-related osteoporosis without current pathological fracture: Secondary | ICD-10-CM | POA: Diagnosis not present

## 2023-07-23 DIAGNOSIS — M6259 Muscle wasting and atrophy, not elsewhere classified, multiple sites: Secondary | ICD-10-CM | POA: Diagnosis not present

## 2023-07-23 DIAGNOSIS — Z9181 History of falling: Secondary | ICD-10-CM | POA: Diagnosis not present

## 2023-07-23 DIAGNOSIS — M545 Low back pain, unspecified: Secondary | ICD-10-CM | POA: Diagnosis not present

## 2023-07-24 DIAGNOSIS — M810 Age-related osteoporosis without current pathological fracture: Secondary | ICD-10-CM | POA: Diagnosis not present

## 2023-07-24 DIAGNOSIS — Z9181 History of falling: Secondary | ICD-10-CM | POA: Diagnosis not present

## 2023-07-24 DIAGNOSIS — M6259 Muscle wasting and atrophy, not elsewhere classified, multiple sites: Secondary | ICD-10-CM | POA: Diagnosis not present

## 2023-07-24 DIAGNOSIS — M545 Low back pain, unspecified: Secondary | ICD-10-CM | POA: Diagnosis not present

## 2023-07-25 DIAGNOSIS — M810 Age-related osteoporosis without current pathological fracture: Secondary | ICD-10-CM | POA: Diagnosis not present

## 2023-07-25 DIAGNOSIS — Z9181 History of falling: Secondary | ICD-10-CM | POA: Diagnosis not present

## 2023-07-25 DIAGNOSIS — M545 Low back pain, unspecified: Secondary | ICD-10-CM | POA: Diagnosis not present

## 2023-07-25 DIAGNOSIS — M6259 Muscle wasting and atrophy, not elsewhere classified, multiple sites: Secondary | ICD-10-CM | POA: Diagnosis not present

## 2023-07-26 DIAGNOSIS — M810 Age-related osteoporosis without current pathological fracture: Secondary | ICD-10-CM | POA: Diagnosis not present

## 2023-07-26 DIAGNOSIS — M6259 Muscle wasting and atrophy, not elsewhere classified, multiple sites: Secondary | ICD-10-CM | POA: Diagnosis not present

## 2023-07-26 DIAGNOSIS — M545 Low back pain, unspecified: Secondary | ICD-10-CM | POA: Diagnosis not present

## 2023-07-26 DIAGNOSIS — Z9181 History of falling: Secondary | ICD-10-CM | POA: Diagnosis not present

## 2023-07-29 DIAGNOSIS — Z9181 History of falling: Secondary | ICD-10-CM | POA: Diagnosis not present

## 2023-07-29 DIAGNOSIS — M6259 Muscle wasting and atrophy, not elsewhere classified, multiple sites: Secondary | ICD-10-CM | POA: Diagnosis not present

## 2023-07-29 DIAGNOSIS — M810 Age-related osteoporosis without current pathological fracture: Secondary | ICD-10-CM | POA: Diagnosis not present

## 2023-07-29 DIAGNOSIS — M545 Low back pain, unspecified: Secondary | ICD-10-CM | POA: Diagnosis not present

## 2023-07-30 DIAGNOSIS — M810 Age-related osteoporosis without current pathological fracture: Secondary | ICD-10-CM | POA: Diagnosis not present

## 2023-07-30 DIAGNOSIS — Z9181 History of falling: Secondary | ICD-10-CM | POA: Diagnosis not present

## 2023-07-30 DIAGNOSIS — M545 Low back pain, unspecified: Secondary | ICD-10-CM | POA: Diagnosis not present

## 2023-07-30 DIAGNOSIS — M6259 Muscle wasting and atrophy, not elsewhere classified, multiple sites: Secondary | ICD-10-CM | POA: Diagnosis not present

## 2023-07-31 DIAGNOSIS — M6259 Muscle wasting and atrophy, not elsewhere classified, multiple sites: Secondary | ICD-10-CM | POA: Diagnosis not present

## 2023-07-31 DIAGNOSIS — M810 Age-related osteoporosis without current pathological fracture: Secondary | ICD-10-CM | POA: Diagnosis not present

## 2023-07-31 DIAGNOSIS — Z9181 History of falling: Secondary | ICD-10-CM | POA: Diagnosis not present

## 2023-07-31 DIAGNOSIS — M545 Low back pain, unspecified: Secondary | ICD-10-CM | POA: Diagnosis not present

## 2023-08-01 DIAGNOSIS — M810 Age-related osteoporosis without current pathological fracture: Secondary | ICD-10-CM | POA: Diagnosis not present

## 2023-08-01 DIAGNOSIS — M6259 Muscle wasting and atrophy, not elsewhere classified, multiple sites: Secondary | ICD-10-CM | POA: Diagnosis not present

## 2023-08-01 DIAGNOSIS — M545 Low back pain, unspecified: Secondary | ICD-10-CM | POA: Diagnosis not present

## 2023-08-01 DIAGNOSIS — Z9181 History of falling: Secondary | ICD-10-CM | POA: Diagnosis not present

## 2023-08-05 DIAGNOSIS — M6259 Muscle wasting and atrophy, not elsewhere classified, multiple sites: Secondary | ICD-10-CM | POA: Diagnosis not present

## 2023-08-05 DIAGNOSIS — Z9181 History of falling: Secondary | ICD-10-CM | POA: Diagnosis not present

## 2023-08-05 DIAGNOSIS — M545 Low back pain, unspecified: Secondary | ICD-10-CM | POA: Diagnosis not present

## 2023-08-05 DIAGNOSIS — M810 Age-related osteoporosis without current pathological fracture: Secondary | ICD-10-CM | POA: Diagnosis not present

## 2023-08-06 DIAGNOSIS — M545 Low back pain, unspecified: Secondary | ICD-10-CM | POA: Diagnosis not present

## 2023-08-06 DIAGNOSIS — M6259 Muscle wasting and atrophy, not elsewhere classified, multiple sites: Secondary | ICD-10-CM | POA: Diagnosis not present

## 2023-08-06 DIAGNOSIS — M810 Age-related osteoporosis without current pathological fracture: Secondary | ICD-10-CM | POA: Diagnosis not present

## 2023-08-06 DIAGNOSIS — Z9181 History of falling: Secondary | ICD-10-CM | POA: Diagnosis not present

## 2023-08-07 DIAGNOSIS — M545 Low back pain, unspecified: Secondary | ICD-10-CM | POA: Diagnosis not present

## 2023-08-07 DIAGNOSIS — M810 Age-related osteoporosis without current pathological fracture: Secondary | ICD-10-CM | POA: Diagnosis not present

## 2023-08-07 DIAGNOSIS — Z9181 History of falling: Secondary | ICD-10-CM | POA: Diagnosis not present

## 2023-08-07 DIAGNOSIS — M6259 Muscle wasting and atrophy, not elsewhere classified, multiple sites: Secondary | ICD-10-CM | POA: Diagnosis not present

## 2023-08-08 DIAGNOSIS — M545 Low back pain, unspecified: Secondary | ICD-10-CM | POA: Diagnosis not present

## 2023-08-08 DIAGNOSIS — M810 Age-related osteoporosis without current pathological fracture: Secondary | ICD-10-CM | POA: Diagnosis not present

## 2023-08-08 DIAGNOSIS — Z9181 History of falling: Secondary | ICD-10-CM | POA: Diagnosis not present

## 2023-08-08 DIAGNOSIS — M6259 Muscle wasting and atrophy, not elsewhere classified, multiple sites: Secondary | ICD-10-CM | POA: Diagnosis not present

## 2023-08-12 DIAGNOSIS — Z9181 History of falling: Secondary | ICD-10-CM | POA: Diagnosis not present

## 2023-08-12 DIAGNOSIS — M545 Low back pain, unspecified: Secondary | ICD-10-CM | POA: Diagnosis not present

## 2023-08-12 DIAGNOSIS — M6259 Muscle wasting and atrophy, not elsewhere classified, multiple sites: Secondary | ICD-10-CM | POA: Diagnosis not present

## 2023-08-12 DIAGNOSIS — M810 Age-related osteoporosis without current pathological fracture: Secondary | ICD-10-CM | POA: Diagnosis not present

## 2023-08-13 DIAGNOSIS — Z7984 Long term (current) use of oral hypoglycemic drugs: Secondary | ICD-10-CM | POA: Diagnosis not present

## 2023-08-13 DIAGNOSIS — E1122 Type 2 diabetes mellitus with diabetic chronic kidney disease: Secondary | ICD-10-CM | POA: Diagnosis not present

## 2023-08-13 DIAGNOSIS — E785 Hyperlipidemia, unspecified: Secondary | ICD-10-CM | POA: Diagnosis not present

## 2023-08-13 DIAGNOSIS — M545 Low back pain, unspecified: Secondary | ICD-10-CM | POA: Diagnosis not present

## 2023-08-13 DIAGNOSIS — I509 Heart failure, unspecified: Secondary | ICD-10-CM | POA: Diagnosis not present

## 2023-08-13 DIAGNOSIS — N189 Chronic kidney disease, unspecified: Secondary | ICD-10-CM | POA: Diagnosis not present

## 2023-08-13 DIAGNOSIS — M6259 Muscle wasting and atrophy, not elsewhere classified, multiple sites: Secondary | ICD-10-CM | POA: Diagnosis not present

## 2023-08-13 DIAGNOSIS — Z9181 History of falling: Secondary | ICD-10-CM | POA: Diagnosis not present

## 2023-08-13 DIAGNOSIS — M810 Age-related osteoporosis without current pathological fracture: Secondary | ICD-10-CM | POA: Diagnosis not present

## 2023-08-13 DIAGNOSIS — I110 Hypertensive heart disease with heart failure: Secondary | ICD-10-CM | POA: Diagnosis not present

## 2023-08-15 DIAGNOSIS — M810 Age-related osteoporosis without current pathological fracture: Secondary | ICD-10-CM | POA: Diagnosis not present

## 2023-08-15 DIAGNOSIS — M545 Low back pain, unspecified: Secondary | ICD-10-CM | POA: Diagnosis not present

## 2023-08-15 DIAGNOSIS — M6259 Muscle wasting and atrophy, not elsewhere classified, multiple sites: Secondary | ICD-10-CM | POA: Diagnosis not present

## 2023-08-15 DIAGNOSIS — Z9181 History of falling: Secondary | ICD-10-CM | POA: Diagnosis not present

## 2023-08-16 DIAGNOSIS — M6259 Muscle wasting and atrophy, not elsewhere classified, multiple sites: Secondary | ICD-10-CM | POA: Diagnosis not present

## 2023-08-16 DIAGNOSIS — M545 Low back pain, unspecified: Secondary | ICD-10-CM | POA: Diagnosis not present

## 2023-08-16 DIAGNOSIS — M810 Age-related osteoporosis without current pathological fracture: Secondary | ICD-10-CM | POA: Diagnosis not present

## 2023-08-16 DIAGNOSIS — Z9181 History of falling: Secondary | ICD-10-CM | POA: Diagnosis not present

## 2023-08-19 DIAGNOSIS — M6259 Muscle wasting and atrophy, not elsewhere classified, multiple sites: Secondary | ICD-10-CM | POA: Diagnosis not present

## 2023-08-19 DIAGNOSIS — M810 Age-related osteoporosis without current pathological fracture: Secondary | ICD-10-CM | POA: Diagnosis not present

## 2023-08-19 DIAGNOSIS — M545 Low back pain, unspecified: Secondary | ICD-10-CM | POA: Diagnosis not present

## 2023-08-19 DIAGNOSIS — Z9181 History of falling: Secondary | ICD-10-CM | POA: Diagnosis not present

## 2023-08-21 DIAGNOSIS — Z9181 History of falling: Secondary | ICD-10-CM | POA: Diagnosis not present

## 2023-08-21 DIAGNOSIS — M545 Low back pain, unspecified: Secondary | ICD-10-CM | POA: Diagnosis not present

## 2023-08-21 DIAGNOSIS — M6259 Muscle wasting and atrophy, not elsewhere classified, multiple sites: Secondary | ICD-10-CM | POA: Diagnosis not present

## 2023-08-21 DIAGNOSIS — M810 Age-related osteoporosis without current pathological fracture: Secondary | ICD-10-CM | POA: Diagnosis not present

## 2023-08-24 DIAGNOSIS — M6259 Muscle wasting and atrophy, not elsewhere classified, multiple sites: Secondary | ICD-10-CM | POA: Diagnosis not present

## 2023-08-24 DIAGNOSIS — Z9181 History of falling: Secondary | ICD-10-CM | POA: Diagnosis not present

## 2023-08-24 DIAGNOSIS — M545 Low back pain, unspecified: Secondary | ICD-10-CM | POA: Diagnosis not present

## 2023-08-24 DIAGNOSIS — M810 Age-related osteoporosis without current pathological fracture: Secondary | ICD-10-CM | POA: Diagnosis not present

## 2023-08-27 NOTE — Progress Notes (Signed)
 Ophthalmology Department Clinical Visit Note     CHIEF COMPLAINT Patient presents for Glaucoma   HISTORY OF PRESENT ILLNESS: Lindsey Baldwin is a 78 y.o. old female who presents to the clinic today for:   HPI     Glaucoma   The patient is here for a return visit.  The patient is currently taking glaucoma medications (Timolol  x2 OD Latanoprost  x1 OD Pilocarpine  1% x4 OD  ).  The patient always complies with treatment.  The patient reports they are tolerating drops well.  The patient does not need an eye medication refill.        Comments   POAG right eye (severe stage). Patient reports vision stable and unchanged since last exam. No discomfort.       Last edited by Starla GORMAN Larve, COA on 08/28/2023  3:25 PM.     POAG OD, severe.   H/o ROP OU - prosthesis OS  Had TRD and PPV OD in 1987   H/o DM Did not tolerate Brim or Dorz  Humphrey visual field interpretation 30-2 OD 10/30/2021: Good reliability OD OD: [place] - VFI 62; MD -17.2; GPA -0.8; stable last 3 fields   Humphrey visual field interpretation 30-2 OD 11/24/2020: Good reliability OD  OD: Superior near altitudinal defect-VFI 61; MD -14.3; GPA -1.0  Humphrey visual field interpretation  10-2 OD   12/17/2016: Good reliability OD OD: Normal within the central 10 degrees - MD -4.5  Humphrey visual field interpretation  01/06/2016: Good reliability OD OD: [place] only - VFI 31; MD -23.3; GPA -1.2; worse  Humphrey visual field interpretation  09/17/2014: Good reliability OD OD:  [place] peripheral defects in both fields - MD-16.3; stable  Humphrey visual field interpretation 09/08/2012:   Good reliability OD   OD: Nasal defect in both hemifields, superior > inferior; stable   OCT RNFL -  10/30/2021 Reliability:  Unusable, QI  17 OD OD:  Unusable due to artifact   CURRENT MEDICATIONS: Current Outpatient Medications on File Prior to Visit (Ophthalmic Drugs)  Medication Sig  .  latanoprost  (XALATAN ) 0.005 % ophthalmic solution Administer 1 drop into the right eye nightly.  . pilocarpine  (PILOCAR) 1 % ophthalmic solution Administer 1 drop into the right eye 4 (four) times a day.  . timolol  (TIMOPTIC ) 0.5 % ophthalmic solution Administer 1 drop into the right eye 2 (two) times a day.   Current Outpatient Medications on File Prior to Visit (Other)  Medication Sig  . aspirin  81 mg chewable tablet Take 81 mg by mouth Once Daily.  . atorvastatin  (LIPITOR) 40 mg tablet Take  by mouth.  . furosemide  (LASIX ) 20 mg tablet Take  by mouth Once Daily.  . metFORMIN  (GLUCOPHAGE ) 500 mg tablet Take  by mouth.  . mirabegron  (Myrbetriq ) 50 mg Tb24 Take  by mouth.  . quinapriL (ACCUPRIL) 40 mg tablet Take  by mouth.    Referring physician: No referring provider defined for this encounter.  ALLERGIES Allergies  Allergen Reactions  . Bimatoprost Itching  . Brimonidine Itching  . Dorzolamide-Timolol  Itching  . Penicillins Swelling  . Sulfamethoxazole Itching    PAST MEDICAL HISTORY Past Medical History:  Diagnosis Date  . Arthritis   . Cataract   . Dementia (CMD)   . Diabetes mellitus    (CMD)    Med Hx: Diabetes mellitus;   . Diabetic retinopathy    (CMD)   . Glaucoma, unspecified glaucoma type, unspecified laterality    Med Hx: Glaucoma;  Past Surgical History:  Procedure Laterality Date  . CATARACT EXTRACTION     Procedure: CATARACT EXTRACTION  . KNEE SURGERY Right    Procedure: KNEE SURGERY  . NECK SURGERY     Procedure: NECK CYST EXCISION    FAMILY HISTORY Family History  Problem Relation Name Age of Onset  . Cancer Mother    . Macular degeneration Mother    . Glaucoma Mother    . Cancer Father      SOCIAL HISTORY Social History   Tobacco Use  . Smoking status: Never  . Smokeless tobacco: Never  Substance Use Topics  . Alcohol  use: No  . 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 Primary open angle glaucoma of right eye, severe stage       Patient is tolerating and taking medication as prescribed. Patient denies any pain in prosthesis OS. IOP acceptable OD. Continue drops the same Recheck in 5 months  PLAN: Timolol  x2 OD Latanoprost  x1 OD Pilocarpine  1% x4 OD  Reviewed proper drop installation technique Return in about 5 months (around 01/28/2024).  There were no meds ordered this visit.  Patient Instructions   Drug EYE R = Right L = Left B = Both Breakfast Lunch Supper Bedtime Time Frame      Timolol   (Yellow top)  R X   X   Latanoprost  (Teal green top)  R    X   Pilocarpine  1%    R X X X X    HOW TO TAKE EYEDROPS 1. Make sure you understand when, and exactly how often, to take your eyedrop medicine. Preferably, learn the names of your medicines, or at least be able to identify your eyedrops by the color of the cap of the bottle (e.g., "I take the purple top 2 times a day in the right eye and the yellow top 1 time a day in both eyes."). 2. Always wash your hands prior to instilling your eyedrops.  3. If you are putting drops in both eyes,  then sit down or lie down to put in your drops. This is because it is important to close the eye getting drops for 2 minutes immediately after instilling the drop (this markedly improves absorption). If you are putting the drops in both eyes, then you will be closing both eyes for at least 2 minutes, therefore you should be sitting or lying down. If you must stand to put in the drops (need to look in mirror), then have a chair next to you so that you can sit just after instilling the drops and close your eyes for 2 minutes. If you are only instilling an eyedrop in one eye, then you only have to close that eye after the drop, and therefore you don't necessarily have to sit or lie. 4. If you are taking more than one drop at a time in the same eye, then you must separate the drops by at least 10 minutes. 5. To put in the drop, hold the bottle in your dominant hand between your forefinger and thumb and invert the bottle over the targeted eye. Your head should be tilted back and you should roll your eyes upward. 6. With your other hand, pull the lower eyelid down and try to instill the drop in the "pouch" made by pulling the lid down, or on the lower part of the eye ball. Position the bottle close to the eye, but do not touch the eye with the dropper tip. Squeeze the sides of the bottle and a drop of the medicine will come out. 7. You will feel the drop when it contacts the eye, and then you should immediately close the eye, or if you are putting the drop in both eyes, move your hands immediately to the other eye, and place the drop in it, and then close both eyes as recommended in #2 above. 8. While the eye/s are closed, use a tissue to wipe away any medication that remains on the lid or lashes. You may do this during the 2 minutes that the eye remains closed, just do not open the eye to wipe it. 9. Always remember to put the cap back on the bottle and store the bottle away from pets and not in direct sunlight.  The bottle does not have to be refrigerated, but it may be if that is preferred (some people prefer feeling the cooled eyedrop on the eye). 10. Never stop the drops without conferring with your doctor first, even if you feel that the drops are not helping your eye. If you believe you are experiencing side-effects from the eyedrops, then discuss that, or any issues or concerns, with your doctor.    This document serves as a record of services personally performed by J. Thresa Bracket. It was created on their behalf by My Rayleen, a trained medical scribe. The creation of this record is the provider's dictation and/or activities during the visit. I agree the documentation is accurate and complete.  Electronically signed by: JINNY Thresa Bracket, MD 09/01/2023 7:42 PM

## 2023-08-28 DIAGNOSIS — H401113 Primary open-angle glaucoma, right eye, severe stage: Secondary | ICD-10-CM | POA: Diagnosis not present

## 2023-08-29 DIAGNOSIS — M6259 Muscle wasting and atrophy, not elsewhere classified, multiple sites: Secondary | ICD-10-CM | POA: Diagnosis not present

## 2023-08-29 DIAGNOSIS — M810 Age-related osteoporosis without current pathological fracture: Secondary | ICD-10-CM | POA: Diagnosis not present

## 2023-08-29 DIAGNOSIS — M545 Low back pain, unspecified: Secondary | ICD-10-CM | POA: Diagnosis not present

## 2023-08-29 DIAGNOSIS — Z9181 History of falling: Secondary | ICD-10-CM | POA: Diagnosis not present

## 2023-08-30 DIAGNOSIS — M6259 Muscle wasting and atrophy, not elsewhere classified, multiple sites: Secondary | ICD-10-CM | POA: Diagnosis not present

## 2023-08-30 DIAGNOSIS — R29898 Other symptoms and signs involving the musculoskeletal system: Secondary | ICD-10-CM | POA: Diagnosis not present

## 2023-08-30 DIAGNOSIS — M545 Low back pain, unspecified: Secondary | ICD-10-CM | POA: Diagnosis not present

## 2023-08-30 DIAGNOSIS — M519 Unspecified thoracic, thoracolumbar and lumbosacral intervertebral disc disorder: Secondary | ICD-10-CM | POA: Diagnosis not present

## 2023-08-30 DIAGNOSIS — Z9181 History of falling: Secondary | ICD-10-CM | POA: Diagnosis not present

## 2023-08-30 DIAGNOSIS — N183 Chronic kidney disease, stage 3 unspecified: Secondary | ICD-10-CM | POA: Diagnosis not present

## 2023-08-30 DIAGNOSIS — M810 Age-related osteoporosis without current pathological fracture: Secondary | ICD-10-CM | POA: Diagnosis not present

## 2023-08-30 DIAGNOSIS — E871 Hypo-osmolality and hyponatremia: Secondary | ICD-10-CM | POA: Diagnosis not present

## 2023-08-30 DIAGNOSIS — K760 Fatty (change of) liver, not elsewhere classified: Secondary | ICD-10-CM | POA: Diagnosis not present

## 2023-08-30 DIAGNOSIS — R296 Repeated falls: Secondary | ICD-10-CM | POA: Diagnosis not present

## 2023-09-02 DIAGNOSIS — R1319 Other dysphagia: Secondary | ICD-10-CM | POA: Diagnosis not present

## 2023-09-02 DIAGNOSIS — M810 Age-related osteoporosis without current pathological fracture: Secondary | ICD-10-CM | POA: Diagnosis not present

## 2023-09-02 DIAGNOSIS — M545 Low back pain, unspecified: Secondary | ICD-10-CM | POA: Diagnosis not present

## 2023-09-02 DIAGNOSIS — R1311 Dysphagia, oral phase: Secondary | ICD-10-CM | POA: Diagnosis not present

## 2023-09-02 DIAGNOSIS — M6259 Muscle wasting and atrophy, not elsewhere classified, multiple sites: Secondary | ICD-10-CM | POA: Diagnosis not present

## 2023-09-02 DIAGNOSIS — Z9181 History of falling: Secondary | ICD-10-CM | POA: Diagnosis not present

## 2023-09-03 DIAGNOSIS — M810 Age-related osteoporosis without current pathological fracture: Secondary | ICD-10-CM | POA: Diagnosis not present

## 2023-09-03 DIAGNOSIS — R1311 Dysphagia, oral phase: Secondary | ICD-10-CM | POA: Diagnosis not present

## 2023-09-03 DIAGNOSIS — Z9181 History of falling: Secondary | ICD-10-CM | POA: Diagnosis not present

## 2023-09-03 DIAGNOSIS — R1319 Other dysphagia: Secondary | ICD-10-CM | POA: Diagnosis not present

## 2023-09-03 DIAGNOSIS — M6259 Muscle wasting and atrophy, not elsewhere classified, multiple sites: Secondary | ICD-10-CM | POA: Diagnosis not present

## 2023-09-03 DIAGNOSIS — M545 Low back pain, unspecified: Secondary | ICD-10-CM | POA: Diagnosis not present

## 2023-09-04 DIAGNOSIS — R1311 Dysphagia, oral phase: Secondary | ICD-10-CM | POA: Diagnosis not present

## 2023-09-04 DIAGNOSIS — M545 Low back pain, unspecified: Secondary | ICD-10-CM | POA: Diagnosis not present

## 2023-09-04 DIAGNOSIS — M6259 Muscle wasting and atrophy, not elsewhere classified, multiple sites: Secondary | ICD-10-CM | POA: Diagnosis not present

## 2023-09-04 DIAGNOSIS — Z9181 History of falling: Secondary | ICD-10-CM | POA: Diagnosis not present

## 2023-09-04 DIAGNOSIS — R1319 Other dysphagia: Secondary | ICD-10-CM | POA: Diagnosis not present

## 2023-09-04 DIAGNOSIS — M810 Age-related osteoporosis without current pathological fracture: Secondary | ICD-10-CM | POA: Diagnosis not present

## 2023-09-05 DIAGNOSIS — M6259 Muscle wasting and atrophy, not elsewhere classified, multiple sites: Secondary | ICD-10-CM | POA: Diagnosis not present

## 2023-09-05 DIAGNOSIS — Z9181 History of falling: Secondary | ICD-10-CM | POA: Diagnosis not present

## 2023-09-05 DIAGNOSIS — M810 Age-related osteoporosis without current pathological fracture: Secondary | ICD-10-CM | POA: Diagnosis not present

## 2023-09-05 DIAGNOSIS — R1311 Dysphagia, oral phase: Secondary | ICD-10-CM | POA: Diagnosis not present

## 2023-09-05 DIAGNOSIS — M545 Low back pain, unspecified: Secondary | ICD-10-CM | POA: Diagnosis not present

## 2023-09-05 DIAGNOSIS — R1319 Other dysphagia: Secondary | ICD-10-CM | POA: Diagnosis not present

## 2023-09-06 DIAGNOSIS — M810 Age-related osteoporosis without current pathological fracture: Secondary | ICD-10-CM | POA: Diagnosis not present

## 2023-09-06 DIAGNOSIS — R1311 Dysphagia, oral phase: Secondary | ICD-10-CM | POA: Diagnosis not present

## 2023-09-06 DIAGNOSIS — R1319 Other dysphagia: Secondary | ICD-10-CM | POA: Diagnosis not present

## 2023-09-06 DIAGNOSIS — Z9181 History of falling: Secondary | ICD-10-CM | POA: Diagnosis not present

## 2023-09-06 DIAGNOSIS — I10 Essential (primary) hypertension: Secondary | ICD-10-CM | POA: Diagnosis not present

## 2023-09-06 DIAGNOSIS — M545 Low back pain, unspecified: Secondary | ICD-10-CM | POA: Diagnosis not present

## 2023-09-06 DIAGNOSIS — M6259 Muscle wasting and atrophy, not elsewhere classified, multiple sites: Secondary | ICD-10-CM | POA: Diagnosis not present

## 2023-09-10 DIAGNOSIS — R1311 Dysphagia, oral phase: Secondary | ICD-10-CM | POA: Diagnosis not present

## 2023-09-10 DIAGNOSIS — Z9181 History of falling: Secondary | ICD-10-CM | POA: Diagnosis not present

## 2023-09-10 DIAGNOSIS — M810 Age-related osteoporosis without current pathological fracture: Secondary | ICD-10-CM | POA: Diagnosis not present

## 2023-09-10 DIAGNOSIS — M6259 Muscle wasting and atrophy, not elsewhere classified, multiple sites: Secondary | ICD-10-CM | POA: Diagnosis not present

## 2023-09-10 DIAGNOSIS — R1319 Other dysphagia: Secondary | ICD-10-CM | POA: Diagnosis not present

## 2023-09-10 DIAGNOSIS — M545 Low back pain, unspecified: Secondary | ICD-10-CM | POA: Diagnosis not present

## 2023-09-11 DIAGNOSIS — R1311 Dysphagia, oral phase: Secondary | ICD-10-CM | POA: Diagnosis not present

## 2023-09-11 DIAGNOSIS — M545 Low back pain, unspecified: Secondary | ICD-10-CM | POA: Diagnosis not present

## 2023-09-11 DIAGNOSIS — M810 Age-related osteoporosis without current pathological fracture: Secondary | ICD-10-CM | POA: Diagnosis not present

## 2023-09-11 DIAGNOSIS — Z9181 History of falling: Secondary | ICD-10-CM | POA: Diagnosis not present

## 2023-09-11 DIAGNOSIS — M6259 Muscle wasting and atrophy, not elsewhere classified, multiple sites: Secondary | ICD-10-CM | POA: Diagnosis not present

## 2023-09-11 DIAGNOSIS — R1319 Other dysphagia: Secondary | ICD-10-CM | POA: Diagnosis not present

## 2023-09-12 DIAGNOSIS — M545 Low back pain, unspecified: Secondary | ICD-10-CM | POA: Diagnosis not present

## 2023-09-12 DIAGNOSIS — M6259 Muscle wasting and atrophy, not elsewhere classified, multiple sites: Secondary | ICD-10-CM | POA: Diagnosis not present

## 2023-09-12 DIAGNOSIS — R1311 Dysphagia, oral phase: Secondary | ICD-10-CM | POA: Diagnosis not present

## 2023-09-12 DIAGNOSIS — Z9181 History of falling: Secondary | ICD-10-CM | POA: Diagnosis not present

## 2023-09-12 DIAGNOSIS — M810 Age-related osteoporosis without current pathological fracture: Secondary | ICD-10-CM | POA: Diagnosis not present

## 2023-09-12 DIAGNOSIS — R1319 Other dysphagia: Secondary | ICD-10-CM | POA: Diagnosis not present

## 2023-09-13 DIAGNOSIS — R1319 Other dysphagia: Secondary | ICD-10-CM | POA: Diagnosis not present

## 2023-09-13 DIAGNOSIS — M545 Low back pain, unspecified: Secondary | ICD-10-CM | POA: Diagnosis not present

## 2023-09-13 DIAGNOSIS — R1311 Dysphagia, oral phase: Secondary | ICD-10-CM | POA: Diagnosis not present

## 2023-09-13 DIAGNOSIS — M6259 Muscle wasting and atrophy, not elsewhere classified, multiple sites: Secondary | ICD-10-CM | POA: Diagnosis not present

## 2023-09-13 DIAGNOSIS — M810 Age-related osteoporosis without current pathological fracture: Secondary | ICD-10-CM | POA: Diagnosis not present

## 2023-09-13 DIAGNOSIS — Z9181 History of falling: Secondary | ICD-10-CM | POA: Diagnosis not present

## 2023-09-16 DIAGNOSIS — R1311 Dysphagia, oral phase: Secondary | ICD-10-CM | POA: Diagnosis not present

## 2023-09-16 DIAGNOSIS — M810 Age-related osteoporosis without current pathological fracture: Secondary | ICD-10-CM | POA: Diagnosis not present

## 2023-09-16 DIAGNOSIS — M545 Low back pain, unspecified: Secondary | ICD-10-CM | POA: Diagnosis not present

## 2023-09-16 DIAGNOSIS — Z9181 History of falling: Secondary | ICD-10-CM | POA: Diagnosis not present

## 2023-09-16 DIAGNOSIS — M6259 Muscle wasting and atrophy, not elsewhere classified, multiple sites: Secondary | ICD-10-CM | POA: Diagnosis not present

## 2023-09-16 DIAGNOSIS — R1319 Other dysphagia: Secondary | ICD-10-CM | POA: Diagnosis not present

## 2023-09-17 DIAGNOSIS — R1311 Dysphagia, oral phase: Secondary | ICD-10-CM | POA: Diagnosis not present

## 2023-09-17 DIAGNOSIS — M545 Low back pain, unspecified: Secondary | ICD-10-CM | POA: Diagnosis not present

## 2023-09-17 DIAGNOSIS — Z9181 History of falling: Secondary | ICD-10-CM | POA: Diagnosis not present

## 2023-09-17 DIAGNOSIS — M810 Age-related osteoporosis without current pathological fracture: Secondary | ICD-10-CM | POA: Diagnosis not present

## 2023-09-17 DIAGNOSIS — M6259 Muscle wasting and atrophy, not elsewhere classified, multiple sites: Secondary | ICD-10-CM | POA: Diagnosis not present

## 2023-09-17 DIAGNOSIS — R1319 Other dysphagia: Secondary | ICD-10-CM | POA: Diagnosis not present

## 2023-09-18 DIAGNOSIS — R1311 Dysphagia, oral phase: Secondary | ICD-10-CM | POA: Diagnosis not present

## 2023-09-18 DIAGNOSIS — M6259 Muscle wasting and atrophy, not elsewhere classified, multiple sites: Secondary | ICD-10-CM | POA: Diagnosis not present

## 2023-09-18 DIAGNOSIS — M810 Age-related osteoporosis without current pathological fracture: Secondary | ICD-10-CM | POA: Diagnosis not present

## 2023-09-18 DIAGNOSIS — Z9181 History of falling: Secondary | ICD-10-CM | POA: Diagnosis not present

## 2023-09-18 DIAGNOSIS — R1319 Other dysphagia: Secondary | ICD-10-CM | POA: Diagnosis not present

## 2023-09-18 DIAGNOSIS — M545 Low back pain, unspecified: Secondary | ICD-10-CM | POA: Diagnosis not present

## 2023-09-19 DIAGNOSIS — R1311 Dysphagia, oral phase: Secondary | ICD-10-CM | POA: Diagnosis not present

## 2023-09-19 DIAGNOSIS — M545 Low back pain, unspecified: Secondary | ICD-10-CM | POA: Diagnosis not present

## 2023-09-19 DIAGNOSIS — R1319 Other dysphagia: Secondary | ICD-10-CM | POA: Diagnosis not present

## 2023-09-19 DIAGNOSIS — M6259 Muscle wasting and atrophy, not elsewhere classified, multiple sites: Secondary | ICD-10-CM | POA: Diagnosis not present

## 2023-09-19 DIAGNOSIS — M810 Age-related osteoporosis without current pathological fracture: Secondary | ICD-10-CM | POA: Diagnosis not present

## 2023-09-19 DIAGNOSIS — Z9181 History of falling: Secondary | ICD-10-CM | POA: Diagnosis not present

## 2023-09-20 DIAGNOSIS — M6259 Muscle wasting and atrophy, not elsewhere classified, multiple sites: Secondary | ICD-10-CM | POA: Diagnosis not present

## 2023-09-20 DIAGNOSIS — R1311 Dysphagia, oral phase: Secondary | ICD-10-CM | POA: Diagnosis not present

## 2023-09-20 DIAGNOSIS — M545 Low back pain, unspecified: Secondary | ICD-10-CM | POA: Diagnosis not present

## 2023-09-20 DIAGNOSIS — Z9181 History of falling: Secondary | ICD-10-CM | POA: Diagnosis not present

## 2023-09-20 DIAGNOSIS — R1319 Other dysphagia: Secondary | ICD-10-CM | POA: Diagnosis not present

## 2023-09-20 DIAGNOSIS — M810 Age-related osteoporosis without current pathological fracture: Secondary | ICD-10-CM | POA: Diagnosis not present

## 2023-09-23 DIAGNOSIS — M810 Age-related osteoporosis without current pathological fracture: Secondary | ICD-10-CM | POA: Diagnosis not present

## 2023-09-23 DIAGNOSIS — Z9181 History of falling: Secondary | ICD-10-CM | POA: Diagnosis not present

## 2023-09-23 DIAGNOSIS — M545 Low back pain, unspecified: Secondary | ICD-10-CM | POA: Diagnosis not present

## 2023-09-23 DIAGNOSIS — M6259 Muscle wasting and atrophy, not elsewhere classified, multiple sites: Secondary | ICD-10-CM | POA: Diagnosis not present

## 2023-09-23 DIAGNOSIS — R1319 Other dysphagia: Secondary | ICD-10-CM | POA: Diagnosis not present

## 2023-09-23 DIAGNOSIS — R1311 Dysphagia, oral phase: Secondary | ICD-10-CM | POA: Diagnosis not present

## 2023-09-24 DIAGNOSIS — R1319 Other dysphagia: Secondary | ICD-10-CM | POA: Diagnosis not present

## 2023-09-24 DIAGNOSIS — E1142 Type 2 diabetes mellitus with diabetic polyneuropathy: Secondary | ICD-10-CM | POA: Diagnosis not present

## 2023-09-24 DIAGNOSIS — M810 Age-related osteoporosis without current pathological fracture: Secondary | ICD-10-CM | POA: Diagnosis not present

## 2023-09-24 DIAGNOSIS — R1311 Dysphagia, oral phase: Secondary | ICD-10-CM | POA: Diagnosis not present

## 2023-09-24 DIAGNOSIS — Z9181 History of falling: Secondary | ICD-10-CM | POA: Diagnosis not present

## 2023-09-24 DIAGNOSIS — M6259 Muscle wasting and atrophy, not elsewhere classified, multiple sites: Secondary | ICD-10-CM | POA: Diagnosis not present

## 2023-09-24 DIAGNOSIS — M545 Low back pain, unspecified: Secondary | ICD-10-CM | POA: Diagnosis not present

## 2023-09-25 DIAGNOSIS — Z9181 History of falling: Secondary | ICD-10-CM | POA: Diagnosis not present

## 2023-09-25 DIAGNOSIS — M810 Age-related osteoporosis without current pathological fracture: Secondary | ICD-10-CM | POA: Diagnosis not present

## 2023-09-25 DIAGNOSIS — R1319 Other dysphagia: Secondary | ICD-10-CM | POA: Diagnosis not present

## 2023-09-25 DIAGNOSIS — M545 Low back pain, unspecified: Secondary | ICD-10-CM | POA: Diagnosis not present

## 2023-09-25 DIAGNOSIS — M6259 Muscle wasting and atrophy, not elsewhere classified, multiple sites: Secondary | ICD-10-CM | POA: Diagnosis not present

## 2023-09-25 DIAGNOSIS — R1311 Dysphagia, oral phase: Secondary | ICD-10-CM | POA: Diagnosis not present

## 2023-09-26 DIAGNOSIS — M6259 Muscle wasting and atrophy, not elsewhere classified, multiple sites: Secondary | ICD-10-CM | POA: Diagnosis not present

## 2023-09-26 DIAGNOSIS — M810 Age-related osteoporosis without current pathological fracture: Secondary | ICD-10-CM | POA: Diagnosis not present

## 2023-09-26 DIAGNOSIS — M545 Low back pain, unspecified: Secondary | ICD-10-CM | POA: Diagnosis not present

## 2023-09-26 DIAGNOSIS — Z9181 History of falling: Secondary | ICD-10-CM | POA: Diagnosis not present

## 2023-09-26 DIAGNOSIS — R1311 Dysphagia, oral phase: Secondary | ICD-10-CM | POA: Diagnosis not present

## 2023-09-26 DIAGNOSIS — R1319 Other dysphagia: Secondary | ICD-10-CM | POA: Diagnosis not present

## 2023-09-27 DIAGNOSIS — Z9001 Acquired absence of eye: Secondary | ICD-10-CM | POA: Diagnosis not present

## 2023-09-27 DIAGNOSIS — M810 Age-related osteoporosis without current pathological fracture: Secondary | ICD-10-CM | POA: Diagnosis not present

## 2023-09-27 DIAGNOSIS — R1311 Dysphagia, oral phase: Secondary | ICD-10-CM | POA: Diagnosis not present

## 2023-09-27 DIAGNOSIS — L539 Erythematous condition, unspecified: Secondary | ICD-10-CM | POA: Diagnosis not present

## 2023-09-27 DIAGNOSIS — M545 Low back pain, unspecified: Secondary | ICD-10-CM | POA: Diagnosis not present

## 2023-09-27 DIAGNOSIS — R1319 Other dysphagia: Secondary | ICD-10-CM | POA: Diagnosis not present

## 2023-09-27 DIAGNOSIS — H401190 Primary open-angle glaucoma, unspecified eye, stage unspecified: Secondary | ICD-10-CM | POA: Diagnosis not present

## 2023-09-27 DIAGNOSIS — M6259 Muscle wasting and atrophy, not elsewhere classified, multiple sites: Secondary | ICD-10-CM | POA: Diagnosis not present

## 2023-09-27 DIAGNOSIS — Z9181 History of falling: Secondary | ICD-10-CM | POA: Diagnosis not present

## 2023-09-27 DIAGNOSIS — N183 Chronic kidney disease, stage 3 unspecified: Secondary | ICD-10-CM | POA: Diagnosis not present

## 2023-09-27 DIAGNOSIS — Z7984 Long term (current) use of oral hypoglycemic drugs: Secondary | ICD-10-CM | POA: Diagnosis not present

## 2023-09-27 DIAGNOSIS — E1122 Type 2 diabetes mellitus with diabetic chronic kidney disease: Secondary | ICD-10-CM | POA: Diagnosis not present

## 2023-09-30 DIAGNOSIS — M519 Unspecified thoracic, thoracolumbar and lumbosacral intervertebral disc disorder: Secondary | ICD-10-CM | POA: Diagnosis not present

## 2023-09-30 DIAGNOSIS — M6259 Muscle wasting and atrophy, not elsewhere classified, multiple sites: Secondary | ICD-10-CM | POA: Diagnosis not present

## 2023-09-30 DIAGNOSIS — M170 Bilateral primary osteoarthritis of knee: Secondary | ICD-10-CM | POA: Diagnosis not present

## 2023-09-30 DIAGNOSIS — H409 Unspecified glaucoma: Secondary | ICD-10-CM | POA: Diagnosis not present

## 2023-09-30 DIAGNOSIS — M810 Age-related osteoporosis without current pathological fracture: Secondary | ICD-10-CM | POA: Diagnosis not present

## 2023-09-30 DIAGNOSIS — E1142 Type 2 diabetes mellitus with diabetic polyneuropathy: Secondary | ICD-10-CM | POA: Diagnosis not present

## 2023-09-30 DIAGNOSIS — G4733 Obstructive sleep apnea (adult) (pediatric): Secondary | ICD-10-CM | POA: Diagnosis not present

## 2023-09-30 DIAGNOSIS — N3281 Overactive bladder: Secondary | ICD-10-CM | POA: Diagnosis not present

## 2023-09-30 DIAGNOSIS — J302 Other seasonal allergic rhinitis: Secondary | ICD-10-CM | POA: Diagnosis not present

## 2023-09-30 DIAGNOSIS — R1311 Dysphagia, oral phase: Secondary | ICD-10-CM | POA: Diagnosis not present

## 2023-09-30 DIAGNOSIS — N183 Chronic kidney disease, stage 3 unspecified: Secondary | ICD-10-CM | POA: Diagnosis not present

## 2023-09-30 DIAGNOSIS — I110 Hypertensive heart disease with heart failure: Secondary | ICD-10-CM | POA: Diagnosis not present

## 2023-09-30 DIAGNOSIS — R29898 Other symptoms and signs involving the musculoskeletal system: Secondary | ICD-10-CM | POA: Diagnosis not present

## 2023-09-30 DIAGNOSIS — M545 Low back pain, unspecified: Secondary | ICD-10-CM | POA: Diagnosis not present

## 2023-09-30 DIAGNOSIS — Z9181 History of falling: Secondary | ICD-10-CM | POA: Diagnosis not present

## 2023-09-30 DIAGNOSIS — R1319 Other dysphagia: Secondary | ICD-10-CM | POA: Diagnosis not present

## 2023-09-30 DIAGNOSIS — E1122 Type 2 diabetes mellitus with diabetic chronic kidney disease: Secondary | ICD-10-CM | POA: Diagnosis not present

## 2023-10-01 DIAGNOSIS — R1311 Dysphagia, oral phase: Secondary | ICD-10-CM | POA: Diagnosis not present

## 2023-10-01 DIAGNOSIS — M810 Age-related osteoporosis without current pathological fracture: Secondary | ICD-10-CM | POA: Diagnosis not present

## 2023-10-01 DIAGNOSIS — Z9181 History of falling: Secondary | ICD-10-CM | POA: Diagnosis not present

## 2023-10-01 DIAGNOSIS — M6259 Muscle wasting and atrophy, not elsewhere classified, multiple sites: Secondary | ICD-10-CM | POA: Diagnosis not present

## 2023-10-01 DIAGNOSIS — R1319 Other dysphagia: Secondary | ICD-10-CM | POA: Diagnosis not present

## 2023-10-01 DIAGNOSIS — M545 Low back pain, unspecified: Secondary | ICD-10-CM | POA: Diagnosis not present

## 2023-10-02 DIAGNOSIS — M810 Age-related osteoporosis without current pathological fracture: Secondary | ICD-10-CM | POA: Diagnosis not present

## 2023-10-02 DIAGNOSIS — R1319 Other dysphagia: Secondary | ICD-10-CM | POA: Diagnosis not present

## 2023-10-02 DIAGNOSIS — M545 Low back pain, unspecified: Secondary | ICD-10-CM | POA: Diagnosis not present

## 2023-10-02 DIAGNOSIS — M6259 Muscle wasting and atrophy, not elsewhere classified, multiple sites: Secondary | ICD-10-CM | POA: Diagnosis not present

## 2023-10-02 DIAGNOSIS — Z9181 History of falling: Secondary | ICD-10-CM | POA: Diagnosis not present

## 2023-10-02 DIAGNOSIS — R1311 Dysphagia, oral phase: Secondary | ICD-10-CM | POA: Diagnosis not present

## 2023-10-03 DIAGNOSIS — R1311 Dysphagia, oral phase: Secondary | ICD-10-CM | POA: Diagnosis not present

## 2023-10-03 DIAGNOSIS — M545 Low back pain, unspecified: Secondary | ICD-10-CM | POA: Diagnosis not present

## 2023-10-03 DIAGNOSIS — Z9181 History of falling: Secondary | ICD-10-CM | POA: Diagnosis not present

## 2023-10-03 DIAGNOSIS — R1319 Other dysphagia: Secondary | ICD-10-CM | POA: Diagnosis not present

## 2023-10-03 DIAGNOSIS — M6259 Muscle wasting and atrophy, not elsewhere classified, multiple sites: Secondary | ICD-10-CM | POA: Diagnosis not present

## 2023-10-03 DIAGNOSIS — M810 Age-related osteoporosis without current pathological fracture: Secondary | ICD-10-CM | POA: Diagnosis not present

## 2023-10-04 DIAGNOSIS — Z9181 History of falling: Secondary | ICD-10-CM | POA: Diagnosis not present

## 2023-10-04 DIAGNOSIS — M6259 Muscle wasting and atrophy, not elsewhere classified, multiple sites: Secondary | ICD-10-CM | POA: Diagnosis not present

## 2023-10-04 DIAGNOSIS — M810 Age-related osteoporosis without current pathological fracture: Secondary | ICD-10-CM | POA: Diagnosis not present

## 2023-10-04 DIAGNOSIS — R1319 Other dysphagia: Secondary | ICD-10-CM | POA: Diagnosis not present

## 2023-10-04 DIAGNOSIS — M545 Low back pain, unspecified: Secondary | ICD-10-CM | POA: Diagnosis not present

## 2023-10-04 DIAGNOSIS — R1311 Dysphagia, oral phase: Secondary | ICD-10-CM | POA: Diagnosis not present

## 2023-10-07 DIAGNOSIS — M810 Age-related osteoporosis without current pathological fracture: Secondary | ICD-10-CM | POA: Diagnosis not present

## 2023-10-07 DIAGNOSIS — H05011 Cellulitis of right orbit: Secondary | ICD-10-CM | POA: Diagnosis not present

## 2023-10-07 DIAGNOSIS — Z9181 History of falling: Secondary | ICD-10-CM | POA: Diagnosis not present

## 2023-10-07 DIAGNOSIS — H409 Unspecified glaucoma: Secondary | ICD-10-CM | POA: Diagnosis not present

## 2023-10-07 DIAGNOSIS — Z9001 Acquired absence of eye: Secondary | ICD-10-CM | POA: Diagnosis not present

## 2023-10-07 DIAGNOSIS — R1311 Dysphagia, oral phase: Secondary | ICD-10-CM | POA: Diagnosis not present

## 2023-10-07 DIAGNOSIS — R1319 Other dysphagia: Secondary | ICD-10-CM | POA: Diagnosis not present

## 2023-10-07 DIAGNOSIS — M545 Low back pain, unspecified: Secondary | ICD-10-CM | POA: Diagnosis not present

## 2023-10-07 DIAGNOSIS — L539 Erythematous condition, unspecified: Secondary | ICD-10-CM | POA: Diagnosis not present

## 2023-10-07 DIAGNOSIS — J302 Other seasonal allergic rhinitis: Secondary | ICD-10-CM | POA: Diagnosis not present

## 2023-10-07 DIAGNOSIS — M6259 Muscle wasting and atrophy, not elsewhere classified, multiple sites: Secondary | ICD-10-CM | POA: Diagnosis not present

## 2023-10-08 DIAGNOSIS — M6259 Muscle wasting and atrophy, not elsewhere classified, multiple sites: Secondary | ICD-10-CM | POA: Diagnosis not present

## 2023-10-08 DIAGNOSIS — M810 Age-related osteoporosis without current pathological fracture: Secondary | ICD-10-CM | POA: Diagnosis not present

## 2023-10-08 DIAGNOSIS — M545 Low back pain, unspecified: Secondary | ICD-10-CM | POA: Diagnosis not present

## 2023-10-08 DIAGNOSIS — R1319 Other dysphagia: Secondary | ICD-10-CM | POA: Diagnosis not present

## 2023-10-08 DIAGNOSIS — R1311 Dysphagia, oral phase: Secondary | ICD-10-CM | POA: Diagnosis not present

## 2023-10-08 DIAGNOSIS — Z9181 History of falling: Secondary | ICD-10-CM | POA: Diagnosis not present

## 2023-10-09 DIAGNOSIS — L602 Onychogryphosis: Secondary | ICD-10-CM | POA: Diagnosis not present

## 2023-10-09 DIAGNOSIS — M810 Age-related osteoporosis without current pathological fracture: Secondary | ICD-10-CM | POA: Diagnosis not present

## 2023-10-09 DIAGNOSIS — R1319 Other dysphagia: Secondary | ICD-10-CM | POA: Diagnosis not present

## 2023-10-09 DIAGNOSIS — Z9181 History of falling: Secondary | ICD-10-CM | POA: Diagnosis not present

## 2023-10-09 DIAGNOSIS — M6259 Muscle wasting and atrophy, not elsewhere classified, multiple sites: Secondary | ICD-10-CM | POA: Diagnosis not present

## 2023-10-09 DIAGNOSIS — L603 Nail dystrophy: Secondary | ICD-10-CM | POA: Diagnosis not present

## 2023-10-09 DIAGNOSIS — Z794 Long term (current) use of insulin: Secondary | ICD-10-CM | POA: Diagnosis not present

## 2023-10-09 DIAGNOSIS — M545 Low back pain, unspecified: Secondary | ICD-10-CM | POA: Diagnosis not present

## 2023-10-09 DIAGNOSIS — E1151 Type 2 diabetes mellitus with diabetic peripheral angiopathy without gangrene: Secondary | ICD-10-CM | POA: Diagnosis not present

## 2023-10-09 DIAGNOSIS — R1311 Dysphagia, oral phase: Secondary | ICD-10-CM | POA: Diagnosis not present

## 2023-10-10 DIAGNOSIS — Z9181 History of falling: Secondary | ICD-10-CM | POA: Diagnosis not present

## 2023-10-10 DIAGNOSIS — R1311 Dysphagia, oral phase: Secondary | ICD-10-CM | POA: Diagnosis not present

## 2023-10-10 DIAGNOSIS — M545 Low back pain, unspecified: Secondary | ICD-10-CM | POA: Diagnosis not present

## 2023-10-10 DIAGNOSIS — R1319 Other dysphagia: Secondary | ICD-10-CM | POA: Diagnosis not present

## 2023-10-10 DIAGNOSIS — M810 Age-related osteoporosis without current pathological fracture: Secondary | ICD-10-CM | POA: Diagnosis not present

## 2023-10-10 DIAGNOSIS — M6259 Muscle wasting and atrophy, not elsewhere classified, multiple sites: Secondary | ICD-10-CM | POA: Diagnosis not present

## 2023-10-11 DIAGNOSIS — R1311 Dysphagia, oral phase: Secondary | ICD-10-CM | POA: Diagnosis not present

## 2023-10-11 DIAGNOSIS — M810 Age-related osteoporosis without current pathological fracture: Secondary | ICD-10-CM | POA: Diagnosis not present

## 2023-10-11 DIAGNOSIS — M6259 Muscle wasting and atrophy, not elsewhere classified, multiple sites: Secondary | ICD-10-CM | POA: Diagnosis not present

## 2023-10-11 DIAGNOSIS — M545 Low back pain, unspecified: Secondary | ICD-10-CM | POA: Diagnosis not present

## 2023-10-11 DIAGNOSIS — R1319 Other dysphagia: Secondary | ICD-10-CM | POA: Diagnosis not present

## 2023-10-11 DIAGNOSIS — Z9181 History of falling: Secondary | ICD-10-CM | POA: Diagnosis not present

## 2023-10-14 DIAGNOSIS — Z9181 History of falling: Secondary | ICD-10-CM | POA: Diagnosis not present

## 2023-10-14 DIAGNOSIS — M810 Age-related osteoporosis without current pathological fracture: Secondary | ICD-10-CM | POA: Diagnosis not present

## 2023-10-14 DIAGNOSIS — R1319 Other dysphagia: Secondary | ICD-10-CM | POA: Diagnosis not present

## 2023-10-14 DIAGNOSIS — R1311 Dysphagia, oral phase: Secondary | ICD-10-CM | POA: Diagnosis not present

## 2023-10-14 DIAGNOSIS — M6259 Muscle wasting and atrophy, not elsewhere classified, multiple sites: Secondary | ICD-10-CM | POA: Diagnosis not present

## 2023-10-14 DIAGNOSIS — M545 Low back pain, unspecified: Secondary | ICD-10-CM | POA: Diagnosis not present

## 2023-10-15 DIAGNOSIS — M810 Age-related osteoporosis without current pathological fracture: Secondary | ICD-10-CM | POA: Diagnosis not present

## 2023-10-15 DIAGNOSIS — R0982 Postnasal drip: Secondary | ICD-10-CM | POA: Diagnosis not present

## 2023-10-15 DIAGNOSIS — M545 Low back pain, unspecified: Secondary | ICD-10-CM | POA: Diagnosis not present

## 2023-10-15 DIAGNOSIS — Z9181 History of falling: Secondary | ICD-10-CM | POA: Diagnosis not present

## 2023-10-15 DIAGNOSIS — Z9001 Acquired absence of eye: Secondary | ICD-10-CM | POA: Diagnosis not present

## 2023-10-15 DIAGNOSIS — J302 Other seasonal allergic rhinitis: Secondary | ICD-10-CM | POA: Diagnosis not present

## 2023-10-15 DIAGNOSIS — R1311 Dysphagia, oral phase: Secondary | ICD-10-CM | POA: Diagnosis not present

## 2023-10-15 DIAGNOSIS — M150 Primary generalized (osteo)arthritis: Secondary | ICD-10-CM | POA: Diagnosis not present

## 2023-10-15 DIAGNOSIS — R42 Dizziness and giddiness: Secondary | ICD-10-CM | POA: Diagnosis not present

## 2023-10-15 DIAGNOSIS — H05011 Cellulitis of right orbit: Secondary | ICD-10-CM | POA: Diagnosis not present

## 2023-10-15 DIAGNOSIS — H401190 Primary open-angle glaucoma, unspecified eye, stage unspecified: Secondary | ICD-10-CM | POA: Diagnosis not present

## 2023-10-15 DIAGNOSIS — R1319 Other dysphagia: Secondary | ICD-10-CM | POA: Diagnosis not present

## 2023-10-15 DIAGNOSIS — K219 Gastro-esophageal reflux disease without esophagitis: Secondary | ICD-10-CM | POA: Diagnosis not present

## 2023-10-15 DIAGNOSIS — M6259 Muscle wasting and atrophy, not elsewhere classified, multiple sites: Secondary | ICD-10-CM | POA: Diagnosis not present

## 2023-10-16 DIAGNOSIS — M545 Low back pain, unspecified: Secondary | ICD-10-CM | POA: Diagnosis not present

## 2023-10-16 DIAGNOSIS — R1311 Dysphagia, oral phase: Secondary | ICD-10-CM | POA: Diagnosis not present

## 2023-10-16 DIAGNOSIS — M6259 Muscle wasting and atrophy, not elsewhere classified, multiple sites: Secondary | ICD-10-CM | POA: Diagnosis not present

## 2023-10-16 DIAGNOSIS — Z9181 History of falling: Secondary | ICD-10-CM | POA: Diagnosis not present

## 2023-10-16 DIAGNOSIS — R1319 Other dysphagia: Secondary | ICD-10-CM | POA: Diagnosis not present

## 2023-10-16 DIAGNOSIS — M810 Age-related osteoporosis without current pathological fracture: Secondary | ICD-10-CM | POA: Diagnosis not present

## 2023-10-17 DIAGNOSIS — M545 Low back pain, unspecified: Secondary | ICD-10-CM | POA: Diagnosis not present

## 2023-10-17 DIAGNOSIS — M810 Age-related osteoporosis without current pathological fracture: Secondary | ICD-10-CM | POA: Diagnosis not present

## 2023-10-17 DIAGNOSIS — Z9181 History of falling: Secondary | ICD-10-CM | POA: Diagnosis not present

## 2023-10-17 DIAGNOSIS — M6259 Muscle wasting and atrophy, not elsewhere classified, multiple sites: Secondary | ICD-10-CM | POA: Diagnosis not present

## 2023-10-17 DIAGNOSIS — R1311 Dysphagia, oral phase: Secondary | ICD-10-CM | POA: Diagnosis not present

## 2023-10-17 DIAGNOSIS — R1319 Other dysphagia: Secondary | ICD-10-CM | POA: Diagnosis not present

## 2023-10-18 DIAGNOSIS — M6259 Muscle wasting and atrophy, not elsewhere classified, multiple sites: Secondary | ICD-10-CM | POA: Diagnosis not present

## 2023-10-18 DIAGNOSIS — R1319 Other dysphagia: Secondary | ICD-10-CM | POA: Diagnosis not present

## 2023-10-18 DIAGNOSIS — M810 Age-related osteoporosis without current pathological fracture: Secondary | ICD-10-CM | POA: Diagnosis not present

## 2023-10-18 DIAGNOSIS — Z9181 History of falling: Secondary | ICD-10-CM | POA: Diagnosis not present

## 2023-10-18 DIAGNOSIS — M545 Low back pain, unspecified: Secondary | ICD-10-CM | POA: Diagnosis not present

## 2023-10-18 DIAGNOSIS — R1311 Dysphagia, oral phase: Secondary | ICD-10-CM | POA: Diagnosis not present

## 2023-10-21 DIAGNOSIS — Z9181 History of falling: Secondary | ICD-10-CM | POA: Diagnosis not present

## 2023-10-21 DIAGNOSIS — M545 Low back pain, unspecified: Secondary | ICD-10-CM | POA: Diagnosis not present

## 2023-10-21 DIAGNOSIS — M810 Age-related osteoporosis without current pathological fracture: Secondary | ICD-10-CM | POA: Diagnosis not present

## 2023-10-21 DIAGNOSIS — M6259 Muscle wasting and atrophy, not elsewhere classified, multiple sites: Secondary | ICD-10-CM | POA: Diagnosis not present

## 2023-10-21 DIAGNOSIS — R1311 Dysphagia, oral phase: Secondary | ICD-10-CM | POA: Diagnosis not present

## 2023-10-21 DIAGNOSIS — R1319 Other dysphagia: Secondary | ICD-10-CM | POA: Diagnosis not present

## 2023-10-22 DIAGNOSIS — R1319 Other dysphagia: Secondary | ICD-10-CM | POA: Diagnosis not present

## 2023-10-22 DIAGNOSIS — M545 Low back pain, unspecified: Secondary | ICD-10-CM | POA: Diagnosis not present

## 2023-10-22 DIAGNOSIS — Z9181 History of falling: Secondary | ICD-10-CM | POA: Diagnosis not present

## 2023-10-22 DIAGNOSIS — R1311 Dysphagia, oral phase: Secondary | ICD-10-CM | POA: Diagnosis not present

## 2023-10-22 DIAGNOSIS — M810 Age-related osteoporosis without current pathological fracture: Secondary | ICD-10-CM | POA: Diagnosis not present

## 2023-10-22 DIAGNOSIS — M6259 Muscle wasting and atrophy, not elsewhere classified, multiple sites: Secondary | ICD-10-CM | POA: Diagnosis not present

## 2023-10-23 DIAGNOSIS — M810 Age-related osteoporosis without current pathological fracture: Secondary | ICD-10-CM | POA: Diagnosis not present

## 2023-10-23 DIAGNOSIS — Z9181 History of falling: Secondary | ICD-10-CM | POA: Diagnosis not present

## 2023-10-23 DIAGNOSIS — M545 Low back pain, unspecified: Secondary | ICD-10-CM | POA: Diagnosis not present

## 2023-10-23 DIAGNOSIS — M6259 Muscle wasting and atrophy, not elsewhere classified, multiple sites: Secondary | ICD-10-CM | POA: Diagnosis not present

## 2023-10-23 DIAGNOSIS — R1319 Other dysphagia: Secondary | ICD-10-CM | POA: Diagnosis not present

## 2023-10-23 DIAGNOSIS — R1311 Dysphagia, oral phase: Secondary | ICD-10-CM | POA: Diagnosis not present

## 2023-10-24 DIAGNOSIS — M810 Age-related osteoporosis without current pathological fracture: Secondary | ICD-10-CM | POA: Diagnosis not present

## 2023-10-24 DIAGNOSIS — Z9181 History of falling: Secondary | ICD-10-CM | POA: Diagnosis not present

## 2023-10-24 DIAGNOSIS — R1319 Other dysphagia: Secondary | ICD-10-CM | POA: Diagnosis not present

## 2023-10-24 DIAGNOSIS — M6259 Muscle wasting and atrophy, not elsewhere classified, multiple sites: Secondary | ICD-10-CM | POA: Diagnosis not present

## 2023-10-24 DIAGNOSIS — M545 Low back pain, unspecified: Secondary | ICD-10-CM | POA: Diagnosis not present

## 2023-10-24 DIAGNOSIS — R1311 Dysphagia, oral phase: Secondary | ICD-10-CM | POA: Diagnosis not present

## 2023-10-25 DIAGNOSIS — R1319 Other dysphagia: Secondary | ICD-10-CM | POA: Diagnosis not present

## 2023-10-25 DIAGNOSIS — Z9181 History of falling: Secondary | ICD-10-CM | POA: Diagnosis not present

## 2023-10-25 DIAGNOSIS — M6259 Muscle wasting and atrophy, not elsewhere classified, multiple sites: Secondary | ICD-10-CM | POA: Diagnosis not present

## 2023-10-25 DIAGNOSIS — R1311 Dysphagia, oral phase: Secondary | ICD-10-CM | POA: Diagnosis not present

## 2023-10-25 DIAGNOSIS — M545 Low back pain, unspecified: Secondary | ICD-10-CM | POA: Diagnosis not present

## 2023-10-25 DIAGNOSIS — M810 Age-related osteoporosis without current pathological fracture: Secondary | ICD-10-CM | POA: Diagnosis not present

## 2023-10-28 DIAGNOSIS — M6259 Muscle wasting and atrophy, not elsewhere classified, multiple sites: Secondary | ICD-10-CM | POA: Diagnosis not present

## 2023-10-28 DIAGNOSIS — M545 Low back pain, unspecified: Secondary | ICD-10-CM | POA: Diagnosis not present

## 2023-10-28 DIAGNOSIS — R1319 Other dysphagia: Secondary | ICD-10-CM | POA: Diagnosis not present

## 2023-10-28 DIAGNOSIS — R1311 Dysphagia, oral phase: Secondary | ICD-10-CM | POA: Diagnosis not present

## 2023-10-28 DIAGNOSIS — Z9181 History of falling: Secondary | ICD-10-CM | POA: Diagnosis not present

## 2023-10-28 DIAGNOSIS — M810 Age-related osteoporosis without current pathological fracture: Secondary | ICD-10-CM | POA: Diagnosis not present

## 2023-10-29 DIAGNOSIS — M6259 Muscle wasting and atrophy, not elsewhere classified, multiple sites: Secondary | ICD-10-CM | POA: Diagnosis not present

## 2023-10-29 DIAGNOSIS — R1319 Other dysphagia: Secondary | ICD-10-CM | POA: Diagnosis not present

## 2023-10-29 DIAGNOSIS — M810 Age-related osteoporosis without current pathological fracture: Secondary | ICD-10-CM | POA: Diagnosis not present

## 2023-10-29 DIAGNOSIS — R1311 Dysphagia, oral phase: Secondary | ICD-10-CM | POA: Diagnosis not present

## 2023-10-29 DIAGNOSIS — Z9181 History of falling: Secondary | ICD-10-CM | POA: Diagnosis not present

## 2023-10-29 DIAGNOSIS — M545 Low back pain, unspecified: Secondary | ICD-10-CM | POA: Diagnosis not present

## 2023-10-30 DIAGNOSIS — M545 Low back pain, unspecified: Secondary | ICD-10-CM | POA: Diagnosis not present

## 2023-10-30 DIAGNOSIS — Z9181 History of falling: Secondary | ICD-10-CM | POA: Diagnosis not present

## 2023-10-30 DIAGNOSIS — R1311 Dysphagia, oral phase: Secondary | ICD-10-CM | POA: Diagnosis not present

## 2023-10-30 DIAGNOSIS — M810 Age-related osteoporosis without current pathological fracture: Secondary | ICD-10-CM | POA: Diagnosis not present

## 2023-10-30 DIAGNOSIS — R1319 Other dysphagia: Secondary | ICD-10-CM | POA: Diagnosis not present

## 2023-10-30 DIAGNOSIS — M6259 Muscle wasting and atrophy, not elsewhere classified, multiple sites: Secondary | ICD-10-CM | POA: Diagnosis not present

## 2023-10-31 DIAGNOSIS — M545 Low back pain, unspecified: Secondary | ICD-10-CM | POA: Diagnosis not present

## 2023-10-31 DIAGNOSIS — M810 Age-related osteoporosis without current pathological fracture: Secondary | ICD-10-CM | POA: Diagnosis not present

## 2023-10-31 DIAGNOSIS — R1319 Other dysphagia: Secondary | ICD-10-CM | POA: Diagnosis not present

## 2023-10-31 DIAGNOSIS — R1311 Dysphagia, oral phase: Secondary | ICD-10-CM | POA: Diagnosis not present

## 2023-10-31 DIAGNOSIS — Z9181 History of falling: Secondary | ICD-10-CM | POA: Diagnosis not present

## 2023-10-31 DIAGNOSIS — M6259 Muscle wasting and atrophy, not elsewhere classified, multiple sites: Secondary | ICD-10-CM | POA: Diagnosis not present

## 2023-11-01 DIAGNOSIS — Z9181 History of falling: Secondary | ICD-10-CM | POA: Diagnosis not present

## 2023-11-01 DIAGNOSIS — M810 Age-related osteoporosis without current pathological fracture: Secondary | ICD-10-CM | POA: Diagnosis not present

## 2023-11-01 DIAGNOSIS — M6259 Muscle wasting and atrophy, not elsewhere classified, multiple sites: Secondary | ICD-10-CM | POA: Diagnosis not present

## 2023-11-01 DIAGNOSIS — R1311 Dysphagia, oral phase: Secondary | ICD-10-CM | POA: Diagnosis not present

## 2023-11-01 DIAGNOSIS — R1319 Other dysphagia: Secondary | ICD-10-CM | POA: Diagnosis not present

## 2023-11-04 DIAGNOSIS — M6259 Muscle wasting and atrophy, not elsewhere classified, multiple sites: Secondary | ICD-10-CM | POA: Diagnosis not present

## 2023-11-04 DIAGNOSIS — M810 Age-related osteoporosis without current pathological fracture: Secondary | ICD-10-CM | POA: Diagnosis not present

## 2023-11-04 DIAGNOSIS — Z9181 History of falling: Secondary | ICD-10-CM | POA: Diagnosis not present

## 2023-11-04 DIAGNOSIS — R1319 Other dysphagia: Secondary | ICD-10-CM | POA: Diagnosis not present

## 2023-11-04 DIAGNOSIS — R1311 Dysphagia, oral phase: Secondary | ICD-10-CM | POA: Diagnosis not present

## 2023-11-05 DIAGNOSIS — R1319 Other dysphagia: Secondary | ICD-10-CM | POA: Diagnosis not present

## 2023-11-05 DIAGNOSIS — Z9181 History of falling: Secondary | ICD-10-CM | POA: Diagnosis not present

## 2023-11-05 DIAGNOSIS — M810 Age-related osteoporosis without current pathological fracture: Secondary | ICD-10-CM | POA: Diagnosis not present

## 2023-11-05 DIAGNOSIS — R1311 Dysphagia, oral phase: Secondary | ICD-10-CM | POA: Diagnosis not present

## 2023-11-05 DIAGNOSIS — M6259 Muscle wasting and atrophy, not elsewhere classified, multiple sites: Secondary | ICD-10-CM | POA: Diagnosis not present

## 2023-11-06 DIAGNOSIS — R1311 Dysphagia, oral phase: Secondary | ICD-10-CM | POA: Diagnosis not present

## 2023-11-06 DIAGNOSIS — M6259 Muscle wasting and atrophy, not elsewhere classified, multiple sites: Secondary | ICD-10-CM | POA: Diagnosis not present

## 2023-11-06 DIAGNOSIS — M810 Age-related osteoporosis without current pathological fracture: Secondary | ICD-10-CM | POA: Diagnosis not present

## 2023-11-06 DIAGNOSIS — M545 Low back pain, unspecified: Secondary | ICD-10-CM | POA: Diagnosis not present

## 2023-11-06 DIAGNOSIS — Z9181 History of falling: Secondary | ICD-10-CM | POA: Diagnosis not present

## 2023-11-06 DIAGNOSIS — R1319 Other dysphagia: Secondary | ICD-10-CM | POA: Diagnosis not present

## 2023-11-07 DIAGNOSIS — M810 Age-related osteoporosis without current pathological fracture: Secondary | ICD-10-CM | POA: Diagnosis not present

## 2023-11-07 DIAGNOSIS — R1319 Other dysphagia: Secondary | ICD-10-CM | POA: Diagnosis not present

## 2023-11-07 DIAGNOSIS — Z9181 History of falling: Secondary | ICD-10-CM | POA: Diagnosis not present

## 2023-11-07 DIAGNOSIS — M6259 Muscle wasting and atrophy, not elsewhere classified, multiple sites: Secondary | ICD-10-CM | POA: Diagnosis not present

## 2023-11-07 DIAGNOSIS — R1311 Dysphagia, oral phase: Secondary | ICD-10-CM | POA: Diagnosis not present

## 2023-11-08 DIAGNOSIS — Z9181 History of falling: Secondary | ICD-10-CM | POA: Diagnosis not present

## 2023-11-08 DIAGNOSIS — R1319 Other dysphagia: Secondary | ICD-10-CM | POA: Diagnosis not present

## 2023-11-08 DIAGNOSIS — M6259 Muscle wasting and atrophy, not elsewhere classified, multiple sites: Secondary | ICD-10-CM | POA: Diagnosis not present

## 2023-11-08 DIAGNOSIS — R1311 Dysphagia, oral phase: Secondary | ICD-10-CM | POA: Diagnosis not present

## 2023-11-08 DIAGNOSIS — M810 Age-related osteoporosis without current pathological fracture: Secondary | ICD-10-CM | POA: Diagnosis not present

## 2023-11-13 DIAGNOSIS — H47231 Glaucomatous optic atrophy, right eye: Secondary | ICD-10-CM | POA: Diagnosis not present

## 2023-12-03 DIAGNOSIS — R2681 Unsteadiness on feet: Secondary | ICD-10-CM | POA: Diagnosis not present

## 2023-12-03 DIAGNOSIS — M6281 Muscle weakness (generalized): Secondary | ICD-10-CM | POA: Diagnosis not present

## 2023-12-04 DIAGNOSIS — M6281 Muscle weakness (generalized): Secondary | ICD-10-CM | POA: Diagnosis not present

## 2023-12-04 DIAGNOSIS — E1142 Type 2 diabetes mellitus with diabetic polyneuropathy: Secondary | ICD-10-CM | POA: Diagnosis not present

## 2023-12-04 DIAGNOSIS — R2681 Unsteadiness on feet: Secondary | ICD-10-CM | POA: Diagnosis not present

## 2023-12-05 DIAGNOSIS — K219 Gastro-esophageal reflux disease without esophagitis: Secondary | ICD-10-CM | POA: Diagnosis not present

## 2023-12-05 DIAGNOSIS — E538 Deficiency of other specified B group vitamins: Secondary | ICD-10-CM | POA: Diagnosis not present

## 2023-12-05 DIAGNOSIS — R2681 Unsteadiness on feet: Secondary | ICD-10-CM | POA: Diagnosis not present

## 2023-12-05 DIAGNOSIS — I130 Hypertensive heart and chronic kidney disease with heart failure and stage 1 through stage 4 chronic kidney disease, or unspecified chronic kidney disease: Secondary | ICD-10-CM | POA: Diagnosis not present

## 2023-12-05 DIAGNOSIS — I509 Heart failure, unspecified: Secondary | ICD-10-CM | POA: Diagnosis not present

## 2023-12-05 DIAGNOSIS — N183 Chronic kidney disease, stage 3 unspecified: Secondary | ICD-10-CM | POA: Diagnosis not present

## 2023-12-05 DIAGNOSIS — E559 Vitamin D deficiency, unspecified: Secondary | ICD-10-CM | POA: Diagnosis not present

## 2023-12-05 DIAGNOSIS — M6281 Muscle weakness (generalized): Secondary | ICD-10-CM | POA: Diagnosis not present

## 2023-12-06 DIAGNOSIS — R2681 Unsteadiness on feet: Secondary | ICD-10-CM | POA: Diagnosis not present

## 2023-12-06 DIAGNOSIS — M6281 Muscle weakness (generalized): Secondary | ICD-10-CM | POA: Diagnosis not present

## 2023-12-09 DIAGNOSIS — R2681 Unsteadiness on feet: Secondary | ICD-10-CM | POA: Diagnosis not present

## 2023-12-09 DIAGNOSIS — M6281 Muscle weakness (generalized): Secondary | ICD-10-CM | POA: Diagnosis not present

## 2023-12-10 DIAGNOSIS — R2681 Unsteadiness on feet: Secondary | ICD-10-CM | POA: Diagnosis not present

## 2023-12-10 DIAGNOSIS — M6281 Muscle weakness (generalized): Secondary | ICD-10-CM | POA: Diagnosis not present

## 2023-12-11 DIAGNOSIS — R2681 Unsteadiness on feet: Secondary | ICD-10-CM | POA: Diagnosis not present

## 2023-12-11 DIAGNOSIS — E1142 Type 2 diabetes mellitus with diabetic polyneuropathy: Secondary | ICD-10-CM | POA: Diagnosis not present

## 2023-12-11 DIAGNOSIS — E559 Vitamin D deficiency, unspecified: Secondary | ICD-10-CM | POA: Diagnosis not present

## 2023-12-11 DIAGNOSIS — D510 Vitamin B12 deficiency anemia due to intrinsic factor deficiency: Secondary | ICD-10-CM | POA: Diagnosis not present

## 2023-12-11 DIAGNOSIS — M6281 Muscle weakness (generalized): Secondary | ICD-10-CM | POA: Diagnosis not present

## 2023-12-12 DIAGNOSIS — M6281 Muscle weakness (generalized): Secondary | ICD-10-CM | POA: Diagnosis not present

## 2023-12-12 DIAGNOSIS — R2681 Unsteadiness on feet: Secondary | ICD-10-CM | POA: Diagnosis not present

## 2023-12-14 DIAGNOSIS — E1142 Type 2 diabetes mellitus with diabetic polyneuropathy: Secondary | ICD-10-CM | POA: Diagnosis not present

## 2023-12-14 DIAGNOSIS — R2681 Unsteadiness on feet: Secondary | ICD-10-CM | POA: Diagnosis not present

## 2023-12-14 DIAGNOSIS — M6281 Muscle weakness (generalized): Secondary | ICD-10-CM | POA: Diagnosis not present

## 2023-12-16 DIAGNOSIS — R2681 Unsteadiness on feet: Secondary | ICD-10-CM | POA: Diagnosis not present

## 2023-12-16 DIAGNOSIS — M6281 Muscle weakness (generalized): Secondary | ICD-10-CM | POA: Diagnosis not present

## 2023-12-16 DIAGNOSIS — E1142 Type 2 diabetes mellitus with diabetic polyneuropathy: Secondary | ICD-10-CM | POA: Diagnosis not present

## 2023-12-17 DIAGNOSIS — R2681 Unsteadiness on feet: Secondary | ICD-10-CM | POA: Diagnosis not present

## 2023-12-17 DIAGNOSIS — M6281 Muscle weakness (generalized): Secondary | ICD-10-CM | POA: Diagnosis not present

## 2023-12-18 DIAGNOSIS — M6281 Muscle weakness (generalized): Secondary | ICD-10-CM | POA: Diagnosis not present

## 2023-12-18 DIAGNOSIS — R2681 Unsteadiness on feet: Secondary | ICD-10-CM | POA: Diagnosis not present

## 2023-12-19 DIAGNOSIS — M6281 Muscle weakness (generalized): Secondary | ICD-10-CM | POA: Diagnosis not present

## 2023-12-19 DIAGNOSIS — R2681 Unsteadiness on feet: Secondary | ICD-10-CM | POA: Diagnosis not present

## 2023-12-20 DIAGNOSIS — R413 Other amnesia: Secondary | ICD-10-CM | POA: Diagnosis not present

## 2023-12-20 DIAGNOSIS — M6281 Muscle weakness (generalized): Secondary | ICD-10-CM | POA: Diagnosis not present

## 2023-12-20 DIAGNOSIS — R2681 Unsteadiness on feet: Secondary | ICD-10-CM | POA: Diagnosis not present

## 2023-12-25 DIAGNOSIS — R2681 Unsteadiness on feet: Secondary | ICD-10-CM | POA: Diagnosis not present

## 2023-12-25 DIAGNOSIS — M6281 Muscle weakness (generalized): Secondary | ICD-10-CM | POA: Diagnosis not present

## 2023-12-26 DIAGNOSIS — R2681 Unsteadiness on feet: Secondary | ICD-10-CM | POA: Diagnosis not present

## 2023-12-26 DIAGNOSIS — M6281 Muscle weakness (generalized): Secondary | ICD-10-CM | POA: Diagnosis not present

## 2023-12-26 DIAGNOSIS — E1142 Type 2 diabetes mellitus with diabetic polyneuropathy: Secondary | ICD-10-CM | POA: Diagnosis not present

## 2023-12-27 DIAGNOSIS — R2681 Unsteadiness on feet: Secondary | ICD-10-CM | POA: Diagnosis not present

## 2023-12-27 DIAGNOSIS — M6281 Muscle weakness (generalized): Secondary | ICD-10-CM | POA: Diagnosis not present

## 2023-12-27 DIAGNOSIS — E1142 Type 2 diabetes mellitus with diabetic polyneuropathy: Secondary | ICD-10-CM | POA: Diagnosis not present

## 2023-12-28 ENCOUNTER — Emergency Department (HOSPITAL_COMMUNITY)

## 2023-12-28 ENCOUNTER — Other Ambulatory Visit: Payer: Self-pay

## 2023-12-28 ENCOUNTER — Observation Stay (HOSPITAL_COMMUNITY)
Admission: EM | Admit: 2023-12-28 | Discharge: 2023-12-29 | Disposition: A | Discharge status: Skilled Nursing Facility | Attending: Family Medicine | Admitting: Family Medicine

## 2023-12-28 DIAGNOSIS — I639 Cerebral infarction, unspecified: Secondary | ICD-10-CM

## 2023-12-28 DIAGNOSIS — Z79899 Other long term (current) drug therapy: Secondary | ICD-10-CM | POA: Diagnosis not present

## 2023-12-28 DIAGNOSIS — G309 Alzheimer's disease, unspecified: Secondary | ICD-10-CM | POA: Diagnosis not present

## 2023-12-28 DIAGNOSIS — Z794 Long term (current) use of insulin: Secondary | ICD-10-CM | POA: Insufficient documentation

## 2023-12-28 DIAGNOSIS — R112 Nausea with vomiting, unspecified: Secondary | ICD-10-CM | POA: Diagnosis not present

## 2023-12-28 DIAGNOSIS — E871 Hypo-osmolality and hyponatremia: Secondary | ICD-10-CM | POA: Insufficient documentation

## 2023-12-28 DIAGNOSIS — R531 Weakness: Secondary | ICD-10-CM | POA: Diagnosis not present

## 2023-12-28 DIAGNOSIS — F39 Unspecified mood [affective] disorder: Secondary | ICD-10-CM | POA: Diagnosis not present

## 2023-12-28 DIAGNOSIS — R4701 Aphasia: Secondary | ICD-10-CM | POA: Diagnosis not present

## 2023-12-28 DIAGNOSIS — R410 Disorientation, unspecified: Principal | ICD-10-CM

## 2023-12-28 DIAGNOSIS — L03211 Cellulitis of face: Secondary | ICD-10-CM

## 2023-12-28 DIAGNOSIS — R911 Solitary pulmonary nodule: Secondary | ICD-10-CM | POA: Insufficient documentation

## 2023-12-28 DIAGNOSIS — Z7982 Long term (current) use of aspirin: Secondary | ICD-10-CM | POA: Diagnosis not present

## 2023-12-28 DIAGNOSIS — Z993 Dependence on wheelchair: Secondary | ICD-10-CM | POA: Diagnosis not present

## 2023-12-28 DIAGNOSIS — I10 Essential (primary) hypertension: Secondary | ICD-10-CM | POA: Insufficient documentation

## 2023-12-28 DIAGNOSIS — I6782 Cerebral ischemia: Secondary | ICD-10-CM | POA: Diagnosis not present

## 2023-12-28 DIAGNOSIS — H409 Unspecified glaucoma: Secondary | ICD-10-CM | POA: Insufficient documentation

## 2023-12-28 DIAGNOSIS — R918 Other nonspecific abnormal finding of lung field: Secondary | ICD-10-CM | POA: Insufficient documentation

## 2023-12-28 DIAGNOSIS — R29712 NIHSS score 12: Secondary | ICD-10-CM

## 2023-12-28 DIAGNOSIS — I868 Varicose veins of other specified sites: Secondary | ICD-10-CM | POA: Diagnosis not present

## 2023-12-28 DIAGNOSIS — G459 Transient cerebral ischemic attack, unspecified: Secondary | ICD-10-CM | POA: Diagnosis not present

## 2023-12-28 DIAGNOSIS — R519 Headache, unspecified: Secondary | ICD-10-CM

## 2023-12-28 DIAGNOSIS — E119 Type 2 diabetes mellitus without complications: Secondary | ICD-10-CM | POA: Insufficient documentation

## 2023-12-28 DIAGNOSIS — G9340 Encephalopathy, unspecified: Secondary | ICD-10-CM

## 2023-12-28 DIAGNOSIS — K219 Gastro-esophageal reflux disease without esophagitis: Secondary | ICD-10-CM | POA: Diagnosis not present

## 2023-12-28 DIAGNOSIS — Z0389 Encounter for observation for other suspected diseases and conditions ruled out: Secondary | ICD-10-CM | POA: Diagnosis not present

## 2023-12-28 DIAGNOSIS — R4781 Slurred speech: Secondary | ICD-10-CM | POA: Diagnosis not present

## 2023-12-28 DIAGNOSIS — G8192 Hemiplegia, unspecified affecting left dominant side: Secondary | ICD-10-CM | POA: Diagnosis present

## 2023-12-28 DIAGNOSIS — R29810 Facial weakness: Secondary | ICD-10-CM | POA: Diagnosis not present

## 2023-12-28 DIAGNOSIS — E785 Hyperlipidemia, unspecified: Secondary | ICD-10-CM | POA: Diagnosis not present

## 2023-12-28 LAB — CBC
HCT: 38.1 % (ref 36.0–46.0)
Hemoglobin: 12.9 g/dL (ref 12.0–15.0)
MCH: 30.9 pg (ref 26.0–34.0)
MCHC: 33.9 g/dL (ref 30.0–36.0)
MCV: 91.4 fL (ref 80.0–100.0)
Platelets: 153 K/uL (ref 150–400)
RBC: 4.17 MIL/uL (ref 3.87–5.11)
RDW: 12.6 % (ref 11.5–15.5)
WBC: 7.2 K/uL (ref 4.0–10.5)
nRBC: 0 % (ref 0.0–0.2)

## 2023-12-28 LAB — DIFFERENTIAL
Abs Immature Granulocytes: 0.02 K/uL (ref 0.00–0.07)
Basophils Absolute: 0 K/uL (ref 0.0–0.1)
Basophils Relative: 0 %
Eosinophils Absolute: 0.2 K/uL (ref 0.0–0.5)
Eosinophils Relative: 2 %
Immature Granulocytes: 0 %
Lymphocytes Relative: 25 %
Lymphs Abs: 1.8 K/uL (ref 0.7–4.0)
Monocytes Absolute: 0.6 K/uL (ref 0.1–1.0)
Monocytes Relative: 8 %
Neutro Abs: 4.6 K/uL (ref 1.7–7.7)
Neutrophils Relative %: 65 %

## 2023-12-28 LAB — COMPREHENSIVE METABOLIC PANEL WITH GFR
ALT: 15 U/L (ref 0–44)
AST: 24 U/L (ref 15–41)
Albumin: 3.5 g/dL (ref 3.5–5.0)
Alkaline Phosphatase: 54 U/L (ref 38–126)
Anion gap: 16 — ABNORMAL HIGH (ref 5–15)
BUN: 10 mg/dL (ref 8–23)
CO2: 19 mmol/L — ABNORMAL LOW (ref 22–32)
Calcium: 9 mg/dL (ref 8.9–10.3)
Chloride: 97 mmol/L — ABNORMAL LOW (ref 98–111)
Creatinine, Ser: 0.67 mg/dL (ref 0.44–1.00)
GFR, Estimated: >60 mL/min (ref >60)
Glucose, Bld: 152 mg/dL — ABNORMAL HIGH (ref 70–99)
Potassium: 3.7 mmol/L (ref 3.5–5.1)
Sodium: 132 mmol/L — ABNORMAL LOW (ref 135–145)
Total Bilirubin: 0.8 mg/dL (ref 0.0–1.2)
Total Protein: 6.1 g/dL — ABNORMAL LOW (ref 6.5–8.1)

## 2023-12-28 LAB — PROTIME-INR
INR: 1 (ref 0.8–1.2)
Prothrombin Time: 13.8 s (ref 11.4–15.2)

## 2023-12-28 LAB — URINALYSIS, ROUTINE W REFLEX MICROSCOPIC
Bilirubin Urine: NEGATIVE
Glucose, UA: 50 mg/dL — AB
Hgb urine dipstick: NEGATIVE
Ketones, ur: 20 mg/dL — AB
Nitrite: NEGATIVE
Protein, ur: NEGATIVE mg/dL
Specific Gravity, Urine: 1.028 (ref 1.005–1.030)
pH: 7 (ref 5.0–8.0)

## 2023-12-28 LAB — I-STAT CHEM 8, ED
BUN: 10 mg/dL (ref 8–23)
Calcium, Ion: 1.05 mmol/L — ABNORMAL LOW (ref 1.15–1.40)
Chloride: 99 mmol/L (ref 98–111)
Creatinine, Ser: 0.6 mg/dL (ref 0.44–1.00)
Glucose, Bld: 148 mg/dL — ABNORMAL HIGH (ref 70–99)
HCT: 38 % (ref 36.0–46.0)
Hemoglobin: 12.9 g/dL (ref 12.0–15.0)
Potassium: 3.9 mmol/L (ref 3.5–5.1)
Sodium: 134 mmol/L — ABNORMAL LOW (ref 135–145)
TCO2: 21 mmol/L — ABNORMAL LOW (ref 22–32)

## 2023-12-28 LAB — RESP PANEL BY RT-PCR (RSV, FLU A&B, COVID)  RVPGX2
Influenza A by PCR: NEGATIVE
Influenza B by PCR: NEGATIVE
Resp Syncytial Virus by PCR: NEGATIVE
SARS Coronavirus 2 by RT PCR: NEGATIVE

## 2023-12-28 LAB — RAPID URINE DRUG SCREEN, HOSP PERFORMED
Amphetamines: NOT DETECTED
Barbiturates: NOT DETECTED
Benzodiazepines: NOT DETECTED
Cocaine: NOT DETECTED
Opiates: NOT DETECTED
Tetrahydrocannabinol: NOT DETECTED

## 2023-12-28 LAB — APTT: aPTT: 34 s (ref 24–36)

## 2023-12-28 LAB — ETHANOL: Alcohol, Ethyl (B): <15 mg/dL (ref <15)

## 2023-12-28 LAB — CBG MONITORING, ED: Glucose-Capillary: 141 mg/dL — ABNORMAL HIGH (ref 70–99)

## 2023-12-28 MED ORDER — KETOROLAC TROMETHAMINE 15 MG/ML IJ SOLN
15.0000 mg | Freq: Once | INTRAMUSCULAR | Status: AC
  Administered 2023-12-28: 15 mg via INTRAVENOUS
  Filled 2023-12-28: qty 1

## 2023-12-28 MED ORDER — METOCLOPRAMIDE HCL 5 MG/ML IJ SOLN
5.0000 mg | Freq: Once | INTRAMUSCULAR | Status: AC
  Administered 2023-12-28: 5 mg via INTRAVENOUS
  Filled 2023-12-28: qty 2

## 2023-12-28 MED ORDER — ONDANSETRON HCL 4 MG/2ML IJ SOLN: 4.0000 mg | Freq: Four times a day (QID) | INTRAMUSCULAR | Status: DC | PRN

## 2023-12-28 MED ORDER — TETRACAINE HCL 0.5 % OP SOLN
2.0000 [drp] | Freq: Once | OPHTHALMIC | Status: AC
  Administered 2023-12-28: 2 [drp] via OPHTHALMIC
  Filled 2023-12-28: qty 4

## 2023-12-28 MED ORDER — SODIUM CHLORIDE 0.9 % IV SOLN
1.0000 g | Freq: Once | INTRAVENOUS | Status: AC
  Administered 2023-12-28: 1 g via INTRAVENOUS
  Filled 2023-12-28: qty 10

## 2023-12-28 MED ORDER — IOHEXOL 350 MG/ML SOLN
75.0000 mL | Freq: Once | INTRAVENOUS | Status: AC | PRN
Start: 2023-12-28 — End: 2023-12-28
  Administered 2023-12-28: 75 mL via INTRAVENOUS

## 2023-12-28 NOTE — Code Documentation (Signed)
 Stroke Response Nurse Documentation Code Documentation  Lindsey Baldwin is a 78 y.o. female arriving to [facility]  via [facility] on 9/27 with past medical hx of DM2, OSA, HTN, Osteoporosis, GERD, bronchitis, L eye blindness, alzheimers type dementia . On aspirin  325 mg daily. Code stroke was activated by EMS.   Patient from [facility] where she was [facility]/27 and now complaining of Headache, AMS, Left sided weakness.   Stroke team at the bedside on patient arrival. Labs drawn and patient cleared for CT by Dr. Cottie. Patient to CT with team. NIHSS 11, see documentation for details and code stroke times. Patient with decreased LOC, disoriented, left hemianopia, left arm weakness, left leg weakness, Global aphasia , and dysarthria  on exam. The following imaging was completed:  CT Head and CTA. Patient is not a candidate for IV Thrombolytic due to Outside of window. Patient is not a candidate for IR due to No LVO.   Care Plan: Neuro checks q2 hours.    Bedside handoff with ED RN Beryl.    Griselda Alm ORN  Rapid Response RN

## 2023-12-28 NOTE — H&P (Signed)
 History and Physical    Lindsey Baldwin FMW:[id number] DOB: 10-29-1945 DOA: 12/28/2023  PCP: Sun, Vyvyan, MD  Patient coming from: {Blank single:19197::Home,SNF,ALF/ILF,Group Home,BHH,CIR,Hospice,Homeless,[facility] ED,[facility] ED,Outside Hospital,***}  Chief Complaint: ***  HPI: Lindsey Baldwin is a 78 y.o. female with medical history significant of ***   Patient is not able to give much history due to expressive aphasia.  She is oriented to self.  Complaining of headache and nausea.  History provided mostly by her friend/caregiver Alfonso at bedside who states that patient stays at a nursing facility.  She was doing well until today when staff called her to inform her that patient was complaining of headaches and nausea.  When she spoke to the patient over the phone she noticed that she was having difficulty with her speech so she asked the staff at her nursing facility to call EMS.  Patient had an episode of vomiting on the way to the hospital but also had a bowel movement on arrival.  Patient denies abdominal pain.  Denies shortness of breath or chest pain.  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   Bronchitis    GERD (gastroesophageal reflux disease)    Glaucoma, right eye    CLOSED ANGLE   History of kidney stones    Hypertension    OA (osteoarthritis)    KNEES , HANDS   OAB (overactive bladder)    OSA on CPAP    Osteoporosis    Type 2 diabetes mellitus (HCC)    FOLLOWED BY PCP   Wears glasses     Past Surgical History:  Procedure Laterality Date   CHOLECYSTECTOMY     CYST EXCISION  08/2017   POSTERIOR NECK   CYSTOSCOPY WITH RETROGRADE PYELOGRAM, URETEROSCOPY AND STENT PLACEMENT Left 06/16/2018   Procedure: diagnostic URETEROSCOPY AND STENT PLACEMENT;  Surgeon: Sherrilee Belvie CROME, MD;  Location: [facility];  Service: Urology;  Laterality: Left;  1 HR   EXCISION MASS UPPER  EXTREMETIES Right 11/15/2021   Procedure: EXCISION OF SUBCUTANEOUS MASS RIGHT SHOULDER;  Surgeon: Signe Mitzie LABOR, MD;  Location: [facility];  Service: General;  Laterality: Right;   EYE SURGERY Left 1992   REMOVE EYE AND PLACEMENT PROSTHESIS   KNEE ARTHROPLASTY Right 12/12/2017   Procedure: RIGHT TOTAL KNEE ARTHROPLASTY WITH COMPUTER NAVIGATION;  Surgeon: Fidel Rogue, MD;  Location: [facility];  Service: Orthopedics;  Laterality: Right;  Needs RNFA   TONSILLECTOMY  AGE 74     reports that she has never smoked. She has never used smokeless tobacco. She reports that she does not drink alcohol  and does not use drugs.  Allergies  Allergen Reactions   Apraclonidine  Hcl Other (See Comments)    Unknown reaction Pt does not recall **not listed on the MAR**   Penicillins Swelling and Other (See Comments)    Has patient had a PCN reaction causing immediate rash, facial/tongue/throat swelling, SOB or lightheadedness with hypotension: YES Has patient had a PCN reaction causing severe rash involving mucus membranes or skin necrosis: NO Has patient had a PCN reaction that required hospitalization: NO Has patient had a PCN reaction occurring within the last 10 years: NO If all of the above answers are NO, then may proceed with Cephalosporin use.    Sulfa Antibiotics Rash    Family History  Problem Relation Age of Onset   Cancer Mother    Cancer Father  Breast cancer Maternal Aunt     Prior to Admission medications   Medication Sig Start Date End Date Taking? Authorizing Provider  acetaminophen  (TYLENOL ) 500 MG tablet Take 1,000 mg by mouth in the morning, at noon, and at bedtime.   Yes [provider]  alendronate (FOSAMAX) 70 MG tablet Take 70 mg by mouth every Wednesday.   Yes [provider]  alum & mag hydroxide-simeth (MAALOX PLUS) 400-400-40 MG/5ML suspension Take 15 mLs by mouth every 8 (eight) hours as needed for indigestion.   Yes [provider]  aspirin   EC 81 MG tablet Take 81 mg by mouth daily.   Yes [provider]  atorvastatin  (LIPITOR) 10 MG tablet Take 10 mg by mouth at bedtime.   Yes [provider]  calcium  carbonate (TUMS - DOSED IN MG ELEMENTAL CALCIUM ) 500 MG chewable tablet Chew 1,000 mg by mouth 2 (two) times daily.   Yes [provider]  Cholecalciferol (VITAMIN D3 MAXIMUM STRENGTH) 125 MCG (5000 UT) capsule Take 5,000 Units by mouth daily.   Yes [provider]  cyanocobalamin  (VITAMIN B12) 1000 MCG tablet Take 1 tablet (1,000 mcg total) by mouth daily. 02/06/22  Yes Camara, Amadou, MD  DULoxetine (CYMBALTA) 60 MG capsule Take 60 mg by mouth daily. 01/17/23  Yes [provider]  enalapril  (VASOTEC ) 10 MG tablet Take 10 mg by mouth 2 (two) times daily.   Yes [provider]  feeding supplement, GLUCERNA SHAKE, (GLUCERNA SHAKE) LIQD Take 237 mLs by mouth 2 (two) times daily between meals.   Yes [provider]  fluticasone (FLONASE) 50 MCG/ACT nasal spray Place 1 spray into both nostrils daily.   Yes [provider]  furosemide  (LASIX ) 20 MG tablet Take 20 mg by mouth every other day.   Yes [provider]  latanoprost  (XALATAN ) 0.005 % ophthalmic solution Place 1 drop into the right eye at bedtime.   Yes [provider]  Lidocaine  HCl 4 % PTCH Apply 2 patches topically daily. Apply to lower back   Yes [provider]  loratadine (CLARITIN) 10 MG tablet Take 10 mg by mouth daily.   Yes [provider]  Menthol , Topical Analgesic, (BIOFREEZE) 10 % CREA Apply 1 Application topically in the morning and at bedtime. Apply to wrist and any other affected areas   Yes [provider]  metFORMIN  (GLUCOPHAGE -XR) 500 MG 24 hr tablet Take 1,000 mg by mouth 2 (two) times daily with a meal. 04/21/18  Yes [provider]  metoprolol  tartrate (LOPRESSOR ) 25 MG tablet Take 0.5 tablets (12.5 mg total) by mouth 2 (two) times daily.  03/13/19  Yes Ghimire, Donalda HERO, MD  Multiple Vitamins-Minerals (MULTIVITAMIN WITH MINERALS) tablet Take 1 tablet by mouth daily.   Yes [provider]  pantoprazole (PROTONIX) 40 MG tablet Take 40 mg by mouth daily.   Yes [provider]  pilocarpine  (PILOCAR) 1 % ophthalmic solution Place 1 drop into the right eye 4 (four) times daily.   Yes [provider]  rivastigmine  (EXELON ) 4.5 MG capsule Take 1 capsule (4.5 mg total) by mouth 2 (two) times daily. 02/11/23 02/06/24 Yes Camara, Amadou, MD  sitaGLIPtin (JANUVIA) 100 MG tablet Take 100 mg by mouth daily.   Yes [provider]  solifenacin (VESICARE) 10 MG tablet Take 10 mg by mouth daily.   Yes [provider]  tamsulosin  (FLOMAX ) 0.4 MG CAPS capsule TAKE 1 CAPSULE BY MOUTH EVERY DAY 07/15/19  Yes McKenzie, Belvie CROME, MD  timolol  (TIMOPTIC ) 0.5 % ophthalmic solution Place 1 drop into the right eye 2 (two) times daily.   Yes [provider]    Physical Exam: Vitals:   12/28/23 2200 12/28/23 2215 12/28/23 2230 12/28/23 2245  BP: (!) 170/82 (!) 168/73 (!) 160/78 (!) 162/81  Pulse: 88 89 91 95  Resp: 19 17 (!) 24 (!) 22  Temp:      TempSrc:      SpO2: 100% 100% 98% 98%  Weight:        Physical Exam Vitals reviewed.  Constitutional:      General: She is not in acute distress. Eyes:     Conjunctiva/sclera:     Right eye: Right conjunctiva is not injected. No exudate.    Comments: Prosthetic left eye Right eye extraocular movements intact  Cardiovascular:     Rate and Rhythm: Normal rate and regular rhythm.     Heart sounds: Normal heart sounds.  Pulmonary:     Effort: Pulmonary effort is normal. No respiratory distress.     Breath sounds: Normal breath sounds.  Abdominal:     General: Bowel sounds are normal. There is no distension.     Palpations: Abdomen is soft.     Tenderness: There is no abdominal tenderness. There is no guarding.  Musculoskeletal:     Cervical back:  Normal range of motion.     Right lower leg: No edema.     Left lower leg: No edema.  Skin:    General: Skin is warm and dry.  Neurological:     Motor: Weakness present.     Comments: Left upper and lower extremity weakness noted     Labs on Admission: I have personally reviewed following labs and imaging studies  CBC: Recent Labs  Lab 12/28/23 2112 12/28/23 2119  WBC 7.2  --   NEUTROABS 4.6  --   HGB 12.9 12.9  HCT 38.1 38.0  MCV 91.4  --   PLT 153  --    Basic Metabolic Panel: Recent Labs  Lab 12/28/23 2112 12/28/23 2119  NA 132* 134*  K 3.7 3.9  CL 97* 99  CO2 19*  --   GLUCOSE 152* 148*  BUN 10 10  CREATININE 0.67 0.60  CALCIUM  9.0  --    GFR: CrCl cannot be calculated (Unknown ideal weight.). Liver Function Tests: Recent Labs  Lab 12/28/23 2112  AST 24  ALT 15  ALKPHOS 54  BILITOT 0.8  PROT 6.1*  ALBUMIN  3.5   No results for input(s): LIPASE, AMYLASE in the last 168 hours. No results for input(s): AMMONIA in the last 168 hours. Coagulation Profile: Recent Labs  Lab 12/28/23 2112  INR 1.0   Cardiac Enzymes: No results for input(s): CKTOTAL, CKMB, CKMBINDEX, TROPONINI in the last 168 hours. BNP (last 3 results) No results for input(s): PROBNP in the last 8760 hours. HbA1C: No results for input(s): HGBA1C in the last 72 hours. CBG: Recent Labs  Lab 12/28/23 2112  GLUCAP 141*   Lipid Profile: No results for input(s): CHOL, HDL, LDLCALC, TRIG, CHOLHDL, LDLDIRECT in the last 72 hours. Thyroid  Function Tests: No results for input(s): TSH, T4TOTAL, FREET4, T3FREE, THYROIDAB in the last 72 hours. Anemia Panel: No results for input(s): VITAMINB12, FOLATE, FERRITIN, TIBC, IRON, RETICCTPCT in the last 72 hours. Urine analysis:    Component Value Date/Time   COLORURINE STRAW (A) 12/28/2023 2152   APPEARANCEUR CLEAR 12/28/2023 2152   LABSPEC 1.028 12/28/2023 2152   PHURINE 7.0 12/28/2023  2152   GLUCOSEU 50 (A) 12/28/2023 2152   HGBUR NEGATIVE 12/28/2023 2152   BILIRUBINUR NEGATIVE 12/28/2023 2152   KETONESUR 20 (A) 12/28/2023 2152   PROTEINUR NEGATIVE 12/28/2023 2152   NITRITE NEGATIVE 12/28/2023 2152   LEUKOCYTESUR TRACE (A) 12/28/2023 2152    Radiological Exams on Admission: CT ANGIO HEAD NECK W WO CM (CODE STROKE) Result Date: 12/28/2023 CLINICAL DATA:  Initial evaluation for acute neuro deficit, stroke. EXAM: CT ANGIOGRAPHY HEAD AND NECK WITH AND WITHOUT CONTRAST TECHNIQUE: Multidetector CT imaging of the head and neck was performed using the standard protocol during bolus administration of intravenous contrast. Multiplanar CT image reconstructions and MIPs were obtained to evaluate the vascular anatomy. Carotid stenosis measurements (when applicable) are obtained utilizing NASCET criteria, using the distal internal carotid diameter as the denominator. RADIATION DOSE REDUCTION: This exam was performed according to the departmental dose-optimization program which includes automated exposure control, adjustment of the mA and/or kV according to patient size and/or use of iterative reconstruction technique. CONTRAST:  75mL OMNIPAQUE  IOHEXOL  350 MG/ML SOLN COMPARISON:  CT from earlier the same day. FINDINGS: CTA NECK FINDINGS Aortic arch: Standard branching. Imaged portion shows no evidence of aneurysm or dissection. No significant stenosis of the major arch vessel origins. Right carotid system: No evidence of dissection, stenosis (50% or greater), or occlusion. Left carotid system: No evidence of dissection, stenosis (50% or greater), or occlusion. Vertebral arteries: No evidence of dissection, stenosis (50% or greater), or occlusion. Skeleton: No worrisome osseous lesions. Moderate to advanced spondylosis at C4-5 through C6-7. Degenerative changes noted about the TMJs bilaterally. Other neck: No other acute finding. Upper chest: 1.3 cm irregular nodular density present along the right  major fissure (series 7, image 160), indeterminate. 1.3 cm pulmonary nodule within the right upper lobe. Review of the MIP images confirms the above findings CTA HEAD FINDINGS Anterior circulation: Both internal carotid arteries widely patent to the termini without stenosis. A1 segments widely patent. Normal anterior communicating artery complex. Both anterior cerebral arteries widely patent to their distal aspects without stenosis. No M1 stenosis or occlusion. Normal MCA bifurcations. Distal MCA branches well perfused and symmetric. Posterior circulation: Both V4 segments patent without stenosis. Right vertebral artery strongly dominant. Both PICA patent. Basilar patent without stenosis. Superior cerebellar and posterior cerebral arteries widely patent bilaterally. Venous sinuses: Not well assessed due to timing the contrast bolus. Anatomic variants: Dominant left vertebral artery.  No aneurysm. Review of the MIP images confirms the above findings IMPRESSION: 1. Negative CTA of the head and neck. No large vessel occlusion or other emergent finding. No hemodynamically significant or correctable stenosis. 2. 1.3 cm irregular nodular density along the right major fissure, detected on incomplete chest CT. Per Fleischner Society guidelines, recommend prompt noncontrast chest CT for further evaluation. Electronically Signed   By: Morene Hoard M.D.   On: 12/28/2023 21:51   CT HEAD CODE STROKE WO CONTRAST Result Date: 12/28/2023 CLINICAL DATA:  Code stroke. Initial evaluation for acute neuro deficit, stroke suspected, left facial droop. EXAM: CT HEAD WITHOUT CONTRAST TECHNIQUE: Contiguous axial images were obtained from the base of the skull through the vertex without intravenous contrast. RADIATION DOSE REDUCTION: This exam was performed according to the departmental dose-optimization program which includes automated exposure control, adjustment of the mA and/or kV according to patient size and/or use of  iterative reconstruction technique. COMPARISON:  CT from 01/09/2022 FINDINGS: Brain: Cerebral volume within normal limits. Patchy hypodensity involving the supratentorial cerebral white matter, consistent chronic small vessel ischemic  disease, moderate in nature. No acute intracranial hemorrhage. No acute large vessel territory infarct. No mass lesion or midline shift. No hydrocephalus or extra-axial fluid collection. Vascular: No abnormal hyperdense vessel. Scattered vascular calcifications noted within the carotid siphons. Skull: Scalp soft tissues within normal limits for calvarium intact. Sinuses/Orbits: Postoperative changes noted at the left lobe. Paranasal sinuses are largely clear. No mastoid effusion. Other: None. ASPECTS [facility] Stroke Program Early CT Score) - Ganglionic level infarction (caudate, lentiform nuclei, internal capsule, insula, M1-M3 cortex): 7 - Supraganglionic infarction (M4-M6 cortex): 3 Total score (0-10 with 10 being normal): 10 IMPRESSION: 1. No acute intracranial abnormality. 2. ASPECTS is 10. 3. Moderate chronic microvascular ischemic disease. These results were communicated to Dr. Vanessa at 9:28 pm on 12/28/2023 by text page via the [facility] messaging system. Electronically Signed   By: Morene Hoard M.D.   On: 12/28/2023 21:29    EKG: Independently reviewed. ***  Assessment and Plan  DVT prophylaxis: {Blank single:19197::Lovenox ,SQ Heparin,IV heparin gtt,Xarelto,Eliquis,Coumadin,SCDs,***} Code Status: {Blank single:19197::Full Code,DNR,DNR/DNI,Comfort Care,***} Family Communication: ***  Consults called: ***  Level of care: {Blank single:19197::Med-Surg,Telemetry bed,Progressive Care Unit,Step Down Unit} Admission status: *** Time Spent: 75+ minutes***  Editha Ram MD [facility]  If 7PM-7AM, please contact night-coverage [email or web address]  12/28/2023, 11:48 PM

## 2023-12-28 NOTE — ED Triage Notes (Signed)
 Patient arrived with EMS from [facility] , LSN 12 midnight last night , reports headache with left facial droop and left arm weakness and confusion , cleared by EDP at arrival and transported to CT scan with neurologist/rapid response RN .

## 2023-12-28 NOTE — ED Notes (Signed)
 Attempted to perform visual acuity screening on pt. Pt unable to read chart, even with chart moved directly to bedside.

## 2023-12-28 NOTE — Consult Note (Addendum)
 NEUROLOGY CONSULT NOTE   Date of service: December 28, 2023 Patient Name: Lindsey Baldwin MRN:  [id number] DOB:  1945/08/07 Chief Complaint: headache, vomiting, L sided slight weakness Requesting Provider: No att. providers found  History of Present Illness  Lindsey Baldwin is a 78 y.o. female who is DNR/DNI with hx of DM2, OSA, HTN, Osteoporosis, GERD, bronchitis, L eye blindness, alzheimers type dementia who presents with headache, vomiting and L sided weakness.  LSN is apparently midnight on 12/28/23. Staff at facility apparently noted weakness in AM but it does not seem that they called EMS.  Patient vomited enroute and was given zofran  4mg  IV.  Legal guardian Alfonso is at the bedside and reports that she is very involved in her care and knows the patient really well.  She mentions that patient has been complaining of headaches since about Saturday last week.  Her speech seemed to be slowed and had delayed responses but not anything close to how she is today.  She reports that today, could not understand her speech at all over the phone.  She had been complaining of headache and the staff was supposed to give her something for her nausea/vomiting.  At baseline, patient is wheelchair dependent and needs assistance with all ADLs.  LKW: 0001 Modified rankin score: 5-Severe disability-bedridden, incontinent, needs constant attention IV Thrombolysis: not offered, outside window EVT: Not offered, no LVO.  NIHSS components Score: Comment  1a Level of Conscious 0[]  1[x]  2[]  3[]      1b LOC Questions 0[]  1[]  2[x]     Thinks she is 79, did not know the month.  1c LOC Commands 0[x]  1[]  2[]       2 Best Gaze 0[x]  1[]  2[]       3 Visual 0[]  1[x]  2[]  3[]      4 Facial Palsy 0[x]  1[]  2[]  3[]      5a Motor Arm - left 0[]  1[x]  2[]  3[]  4[]  UN[]    5b Motor Arm - Right 0[x]  1[]  2[]  3[]  4[]  UN[]    6a Motor Leg - Left 0[]  1[]  2[]  3[x]  4[]  UN[]    6b Motor Leg - Right 0[]  1[x]  2[]  3[]  4[]  UN[]    7 Limb  Ataxia 0[x]  1[]  2[]  UN[]      8 Sensory 0[x]  1[]  2[]  UN[]      9 Best Language 0[]  1[]  2[x]  3[]      10 Dysarthria 0[]  1[x]  2[]  UN[]      11 Extinct. and Inattention 0[x]  1[]  2[]       TOTAL: 12      ROS  Unable to ascertain due to drowsiness and limited speech output.  Past History   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   Bronchitis    GERD (gastroesophageal reflux disease)    Glaucoma, right eye    CLOSED ANGLE   History of kidney stones    Hypertension    OA (osteoarthritis)    KNEES , HANDS   OAB (overactive bladder)    OSA on CPAP    Osteoporosis    Type 2 diabetes mellitus (HCC)    FOLLOWED BY PCP   Wears glasses     Past Surgical History:  Procedure Laterality Date   CHOLECYSTECTOMY     CYST EXCISION  08/2017   POSTERIOR NECK   CYSTOSCOPY WITH RETROGRADE PYELOGRAM, URETEROSCOPY AND STENT PLACEMENT Left 06/16/2018   Procedure: diagnostic URETEROSCOPY AND STENT PLACEMENT;  Surgeon: Sherrilee Belvie CROME, MD;  Location: [facility];  Service: Urology;  Laterality: Left;  1 HR   EXCISION MASS UPPER EXTREMETIES Right 11/15/2021   Procedure: EXCISION OF SUBCUTANEOUS MASS RIGHT SHOULDER;  Surgeon: Signe Mitzie LABOR, MD;  Location: [facility];  Service: General;  Laterality: Right;   EYE SURGERY Left 1992   REMOVE EYE AND PLACEMENT PROSTHESIS   KNEE ARTHROPLASTY Right 12/12/2017   Procedure: RIGHT TOTAL KNEE ARTHROPLASTY WITH COMPUTER NAVIGATION;  Surgeon: Fidel Rogue, MD;  Location: [facility];  Service: Orthopedics;  Laterality: Right;  Needs RNFA   TONSILLECTOMY  AGE 78    Family History: Family History  Problem Relation Age of Onset   Cancer Mother    Cancer Father    Breast cancer Maternal Aunt     Social History  reports that she has never smoked. She has never used smokeless tobacco. She reports that she does not drink alcohol  and does not use drugs.  Allergies  Allergen Reactions   Apraclonidine  Hcl Other (See Comments)     Unknown reaction Pt does not recall   Penicillins Swelling and Other (See Comments)    Has patient had a PCN reaction causing immediate rash, facial/tongue/throat swelling, SOB or lightheadedness with hypotension: YES Has patient had a PCN reaction causing severe rash involving mucus membranes or skin necrosis: NO Has patient had a PCN reaction that required hospitalization: NO Has patient had a PCN reaction occurring within the last 10 years: NO If all of the above answers are NO, then may proceed with Cephalosporin use.    Sulfa Antibiotics Rash    Medications  No current facility-administered medications for this encounter.  Current Outpatient Medications:    acetaminophen  (TYLENOL ) 500 MG tablet, Take 500 mg by mouth every 6 (six) hours as needed for moderate pain., Disp: , Rfl:    alendronate (FOSAMAX) 70 MG tablet, Take 70 mg by mouth every Monday., Disp: , Rfl:    ASPIRIN  81 PO, Take 81 mg by mouth daily., Disp: , Rfl:    atorvastatin  (LIPITOR) 40 MG tablet, Take 40 mg by mouth in the morning., Disp: , Rfl:    calcium  carbonate (TUMS - DOSED IN MG ELEMENTAL CALCIUM ) 500 MG chewable tablet, Chew 500 mg by mouth 4 (four) times daily as needed for heartburn., Disp: , Rfl:    Cholecalciferol (D3 2000) 50 MCG (2000 UT) CAPS, Take 2,000 Units by mouth daily., Disp: , Rfl:    cyanocobalamin  (VITAMIN B12) 1000 MCG tablet, Take 1 tablet (1,000 mcg total) by mouth daily., Disp: 90 tablet, Rfl: 2   doxycycline (VIBRAMYCIN) 100 MG capsule, Take 100 mg by mouth 2 (two) times daily., Disp: , Rfl:    DULoxetine (CYMBALTA) 60 MG capsule, Take 60 mg by mouth daily., Disp: , Rfl:    enalapril  (VASOTEC ) 10 MG tablet, Take 10 mg by mouth daily., Disp: , Rfl:    famotidine  (PEPCID ) 20 MG tablet, Take 1 tablet (20 mg total) by mouth daily. (Patient taking differently: Take 20 mg by mouth daily as needed for heartburn or indigestion.), Disp:  , Rfl:    furosemide  (LASIX ) 20 MG tablet, Take 20 mg by  mouth every other day., Disp: , Rfl:    guaiFENesin  (MUCINEX ) 600 MG 12 hr tablet, Take 600 mg by mouth 2 (two) times daily as needed., Disp: , Rfl:    ibuprofen (ADVIL) 200 MG tablet, Take 400 mg by mouth daily as needed for headache or mild pain., Disp: , Rfl:    insulin  aspart (NOVOLOG ) 100 UNIT/ML injection, 0-15 Units,  Subcutaneous, 3 times daily with meals CBG < 70: Implement Hypoglycemia protocol CBG 70 - 120: 0 units CBG 121 - 150: 2 units CBG 151 - 200: 3 units CBG 201 - 250: 5 units CBG 251 - 300: 8 units CBG 301 - 350: 11 units CBG 351 - 400: 15 units CBG > 400: call MD, Disp: 10 mL, Rfl: 11   latanoprost  (XALATAN ) 0.005 % ophthalmic solution, Place 1 drop into the right eye at bedtime., Disp: , Rfl:    lidocaine  (XYLOCAINE ) 4 % external solution, Apply topically as needed., Disp: , Rfl:    loratadine (CLARITIN) 10 MG tablet, Take 10 mg by mouth daily as needed for allergies., Disp: , Rfl:    Menthol , Topical Analgesic, (BIOFREEZE) 10 % CREA, Apply 1 Application topically daily as needed (pain)., Disp: , Rfl:    metFORMIN  (GLUCOPHAGE -XR) 500 MG 24 hr tablet, Take 1,000 mg by mouth at bedtime., Disp: , Rfl:    metoprolol  tartrate (LOPRESSOR ) 25 MG tablet, Take 0.5 tablets (12.5 mg total) by mouth 2 (two) times daily., Disp:  , Rfl:    miconazole (MICOTIN) 2 % cream, Apply 1 Application topically 2 (two) times daily., Disp: , Rfl:    Multiple Vitamins-Minerals (MULTIVITAMIN WITH MINERALS) tablet, Take 1 tablet by mouth daily., Disp: , Rfl:    MYRBETRIQ  50 MG TB24 tablet, TAKE 1 TABLET EACH DAY., Disp: 30 tablet, Rfl: PRN   naproxen sodium (ALEVE) 220 MG tablet, Take 440 mg by mouth daily as needed (pain)., Disp: , Rfl:    pilocarpine  (PILOCAR) 1 % ophthalmic solution, Place 1 drop into the right eye 4 (four) times daily., Disp: , Rfl:    rivastigmine  (EXELON ) 4.5 MG capsule, Take 1 capsule (4.5 mg total) by mouth 2 (two) times daily., Disp: 60 capsule, Rfl: 11   sitaGLIPtin (JANUVIA) 100 MG  tablet, Take 100 mg by mouth daily., Disp: , Rfl:    solifenacin (VESICARE) 10 MG tablet, Take 10 mg by mouth daily., Disp: , Rfl:    tamsulosin  (FLOMAX ) 0.4 MG CAPS capsule, TAKE 1 CAPSULE BY MOUTH EVERY DAY, Disp: 30 capsule, Rfl: 3   traMADol  (ULTRAM ) 50 MG tablet, Take 1 tablet (50 mg total) by mouth every 6 (six) hours as needed for severe pain (Pain not controlled by Tylenol , ibuprofen, ice, rest, etc.)., Disp: 5 tablet, Rfl: 0  Vitals   There were no vitals filed for this visit.  There is no height or weight on file to calculate BMI.   Physical Exam   General: Laying comfortably in bed; in no acute distress.  HENT: Normal oropharynx and mucosa. Normal external appearance of ears and nose.  Neck: Supple, no pain or tenderness  CV: No JVD. No peripheral edema.  Pulmonary: Symmetric Chest rise. Normal respiratory effort.  Abdomen: Soft to touch, non-tender.  Ext: No cyanosis, edema, or deformity  Skin: No rash. Normal palpation of skin.   Musculoskeletal: Normal digits and nails by inspection. No clubbing.   Neurologic Examination  Mental status/Cognition: Drowsy, eyes are closed, partially opens eyes to loud voice and is oriented to self only.  Poor attention, continues to fall back to sleep.   Speech/language: Dysarthric speech, limited speech evaluation secondary to drowsiness.  Is oriented to self.  Does not identify objects.  Is able to follow some simple commands.   Cranial nerves:   CN II Prosthetic left eye, right pupil is 4 mm and sluggish reaction to light.   CN III,IV,VI Makes eye contact on left  and right.  No gaze deviation or preference noted.  No nystagmus.   CN V normal sensation in V1, V2, and V3 segments bilaterally   CN VII Symmetric facial smile.   CN VIII Made very brief eye contact to repeated speech.  Does seem to be hard of hearing.   CN IX & X Protecting her airway.    CN XI Head is midline.   CN XII midline tongue protrusion   Motor:  Muscle bulk:  Poor, tone normal Antigravity movement noted in both left upper extremity and right upper extremity.  No drift noted in right upper extremity, left upper extremity drifts down when held about the bed.  Preferentially moves right upper extremity more so the left upper extremity. Wiggles toes on command, antigravity movement noted in right lower extremity.  Some spontaneous movement noted in left lower extremity but no antigravity movement.  Sensation:  Light touch    Pin prick Localizes to proximal pinch in all extremities.   Temperature    Vibration   Proprioception    Coordination/Complex Motor:  Unable to assess.  No obvious ataxia or incoordination noted though.  Labs/Imaging/Neurodiagnostic studies   CBC: No results for input(s): WBC, NEUTROABS, HGB, HCT, MCV, PLT in the last 168 hours. Basic Metabolic Panel:  Lab Results  Component Value Date   NA 138 01/09/2022   K 4.1 01/09/2022   CO2 20 (L) 01/09/2022   GLUCOSE 176 (H) 01/09/2022   BUN 15 01/09/2022   CREATININE 0.84 01/09/2022   CALCIUM  9.8 01/09/2022   GFRNONAA >60 01/09/2022   GFRAA >60 03/10/2019   Lipid Panel: No results found for: LDLCALC HgbA1c:  Lab Results  Component Value Date   HGBA1C 8.0 (H) 11/07/2021   Urine Drug Screen: No results found for: LABOPIA, COCAINSCRNUR, LABBENZ, AMPHETMU, THCU, LABBARB  Alcohol  Level No results found for: ETH INR No results found for: INR APTT No results found for: APTT AED levels: No results found for: PHENYTOIN, ZONISAMIDE, LAMOTRIGINE, LEVETIRACETA  CT Head without contrast(Personally reviewed): CTH was negative for a large hypodensity concerning for a large territory infarct or hyperdensity concerning for an ICH  CT angio Head and Neck with contrast(Personally reviewed): No LVO.  MRI Brain(Personally reviewed): Pending  ASSESSMENT   SUZETTA TIMKO is a 78 y.o. female who is DNR/DNI with hx of DM2, OSA, HTN,  Osteoporosis, GERD, bronchitis, L eye blindness, alzheimers type dementia who presents with headache, vomiting and L sided weakness.  Per legal guardian at the bedside, been reporting headache behind the right eye for the last week with some redness in her eye and complaining of blurred vision.  Had delayed responses to speech on Saturday last week which would be 7 days ago.  However, much worse today and barely able to understand her speech over the phone.  Staff called EMS and she was brought into the ED as a code stroke for concern for left-sided weakness, confusion.  I do see on my exam the patient prefers to move right side more so the left side which does make me concerned about a possible small stroke.  Patient is not a candidate for TNKase as she is outside the window, she is not a candidate for thrombectomy as she has significantly poor baseline and there is no LVO.  RECOMMENDATIONS  - Recommend brain imaging with MRI Brain without contrast and if that shows an acute infarct, recommend admission for full stroke workup. - Recommend measuring IOP given her history of right eye  glaucoma and prosthetic left eye.  Legal guardian mentions headache behind the right eye along with redness and blurriness of her vision. - Recommend CBC, chemistry and infectious screen given significant encephalopathy. ______________________________________________________________________  Plan was discussed with her legal guardian Alfonso at the bedside, plan also discussed with Dr. Cottie with the ED team.  Signed, Brailyn Killion, MD [facility]

## 2023-12-28 NOTE — ED Provider Notes (Signed)
 [place] EMERGENCY DEPARTMENT AT [facility] Provider Note   CSN: [id number] Arrival date & time: 12/28/23  2110  An emergency department physician performed an initial assessment on this suspected stroke patient at 2110.  Patient presents with: Code Stroke / LSN 12 midnight yesterday   Lindsey Baldwin is a 78 y.o. female with a history of dementia, prosthetic left eye, diabetes, presenting to the ED from her nursing facility with concern for altered mental status and left-sided weakness.  Patient reportedly was last seen well at midnight last night into today.  Today she was reportedly complaining of a headache, according to her power of attorney present at the bedside, and conversation with staff from the nursing facility.  There was concern for left-sided weakness for EMS and she was activated as a code stroke.  The patient is level 5 caveat for dementia cannot provide further history.  However her family member who identifies as his next of kin and power of attorney with paperwork at the bedside, reports that the patient has been mumbling and not making sense, was confused about who she was, which is very atypical for her.   HPI     Prior to Admission medications   Medication Sig Start Date End Date Taking? Authorizing Provider  acetaminophen  (TYLENOL ) 500 MG tablet Take 1,000 mg by mouth in the morning, at noon, and at bedtime.   Yes [provider]  alendronate (FOSAMAX) 70 MG tablet Take 70 mg by mouth every Wednesday.   Yes [provider]  alum & mag hydroxide-simeth (MAALOX PLUS) 400-400-40 MG/5ML suspension Take 15 mLs by mouth every 8 (eight) hours as needed for indigestion.   Yes [provider]  aspirin  EC 81 MG tablet Take 81 mg by mouth daily.   Yes [provider]  atorvastatin  (LIPITOR) 10 MG tablet Take 10 mg by mouth at bedtime.   Yes [provider]  calcium  carbonate (TUMS - DOSED IN MG ELEMENTAL CALCIUM ) 500 MG  chewable tablet Chew 1,000 mg by mouth 2 (two) times daily.   Yes [provider]  Cholecalciferol (VITAMIN D3 MAXIMUM STRENGTH) 125 MCG (5000 UT) capsule Take 5,000 Units by mouth daily.   Yes [provider]  cyanocobalamin  (VITAMIN B12) 1000 MCG tablet Take 1 tablet (1,000 mcg total) by mouth daily. 02/06/22  Yes Camara, Amadou, MD  DULoxetine (CYMBALTA) 60 MG capsule Take 60 mg by mouth daily. 01/17/23  Yes [provider]  enalapril  (VASOTEC ) 10 MG tablet Take 10 mg by mouth 2 (two) times daily.   Yes [provider]  feeding supplement, GLUCERNA SHAKE, (GLUCERNA SHAKE) LIQD Take 237 mLs by mouth 2 (two) times daily between meals.   Yes [provider]  fluticasone (FLONASE) 50 MCG/ACT nasal spray Place 1 spray into both nostrils daily.   Yes [provider]  furosemide  (LASIX ) 20 MG tablet Take 20 mg by mouth every other day.   Yes [provider]  latanoprost  (XALATAN ) 0.005 % ophthalmic solution Place 1 drop into the right eye at bedtime.   Yes [provider]  Lidocaine  HCl 4 % PTCH Apply 2 patches topically daily. Apply to lower back   Yes [provider]  loratadine (CLARITIN) 10 MG tablet Take 10 mg by mouth daily.   Yes [provider]  Menthol , Topical Analgesic, (BIOFREEZE) 10 % CREA Apply 1 Application topically in the morning and at bedtime. Apply to wrist and any other affected areas   Yes [provider]  metFORMIN  (GLUCOPHAGE -XR) 500 MG 24 hr tablet Take 1,000 mg by mouth 2 (two) times daily with a meal. 04/21/18  Yes [provider]  metoprolol  tartrate (LOPRESSOR ) 25 MG tablet Take 0.5 tablets (12.5 mg total) by mouth 2 (two) times daily. 03/13/19  Yes Ghimire, Donalda HERO, MD  Multiple Vitamins-Minerals (MULTIVITAMIN WITH MINERALS) tablet Take 1 tablet by mouth daily.   Yes [provider]  pantoprazole (PROTONIX) 40 MG tablet Take 40 mg by mouth daily.   Yes  [provider]  pilocarpine  (PILOCAR) 1 % ophthalmic solution Place 1 drop into the right eye 4 (four) times daily.   Yes [provider]  rivastigmine  (EXELON ) 4.5 MG capsule Take 1 capsule (4.5 mg total) by mouth 2 (two) times daily. 02/11/23 02/06/24 Yes Camara, Amadou, MD  sitaGLIPtin (JANUVIA) 100 MG tablet Take 100 mg by mouth daily.   Yes [provider]  solifenacin (VESICARE) 10 MG tablet Take 10 mg by mouth daily.   Yes [provider]  tamsulosin  (FLOMAX ) 0.4 MG CAPS capsule TAKE 1 CAPSULE BY MOUTH EVERY DAY 07/15/19  Yes McKenzie, Belvie CROME, MD  timolol  (TIMOPTIC ) 0.5 % ophthalmic solution Place 1 drop into the right eye 2 (two) times daily.   Yes [provider]  miconazole (MICOTIN) 2 % cream Apply 1 Application topically 2 (two) times daily.    [provider]  MYRBETRIQ  50 MG TB24 tablet TAKE 1 TABLET EACH DAY. 11/23/20   McKenzie, Belvie CROME, MD  naproxen sodium (ALEVE) 220 MG tablet Take 440 mg by mouth daily as needed (pain).    [provider]  traMADol  (ULTRAM ) 50 MG tablet Take 1 tablet (50 mg total) by mouth every 6 (six) hours as needed for severe pain (Pain not controlled by Tylenol , ibuprofen, ice, rest, etc.). 11/15/21   Signe Mitzie LABOR, MD    Allergies: Apraclonidine  hcl, Penicillins, and Sulfa antibiotics    Review of Systems  Updated Vital Signs BP (!) 162/81   Pulse 95   Temp (!) 96.9 F (36.1 C) (Oral)   Resp (!) 22   Wt 66.4 kg   SpO2 98%   BMI 25.93 kg/m   Physical Exam Constitutional:      Comments: Eyes closed, mumbling  HENT:     Head: Normocephalic and atraumatic.     Comments: Prosthetic left eye Eyes:     Conjunctiva/sclera: Conjunctivae normal.     Comments: Prosthetic left eye, right eye does have erythema surrounding preseptal region, no proptosis of the eye  Cardiovascular:     Rate and Rhythm: Normal rate and regular rhythm.  Pulmonary:     Effort: Pulmonary effort is  normal. No respiratory distress.  Abdominal:     General: There is no distension.     Tenderness: There is no abdominal tenderness.  Skin:    General: Skin is warm and dry.  Neurological:     Mental Status: She is alert.     Comments: Patient does not follow commands and neurological exam.  She is able to move all extremities but left side does appear weak with motor testing.  She is not able to answer questions or vocalize clearly.     (all labs ordered are listed, but only abnormal results are displayed) Labs Reviewed  COMPREHENSIVE METABOLIC PANEL WITH GFR - Abnormal; Notable for the following components:      Result Value   Sodium 132 (*)    Chloride 97 (*)  CO2 19 (*)    Glucose, Bld 152 (*)    Total Protein 6.1 (*)    Anion gap 16 (*)    All other components within normal limits  URINALYSIS, ROUTINE W REFLEX MICROSCOPIC - Abnormal; Notable for the following components:   Color, Urine STRAW (*)    Glucose, UA 50 (*)    Ketones, ur 20 (*)    Leukocytes,Ua TRACE (*)    Bacteria, UA RARE (*)    All other components within normal limits  I-STAT CHEM 8, ED - Abnormal; Notable for the following components:   Sodium 134 (*)    Glucose, Bld 148 (*)    Calcium , Ion 1.05 (*)    TCO2 21 (*)    All other components within normal limits  CBG MONITORING, ED - Abnormal; Notable for the following components:   Glucose-Capillary 141 (*)    All other components within normal limits  RESP PANEL BY RT-PCR (RSV, FLU A&B, COVID)  RVPGX2  URINE CULTURE  ETHANOL  PROTIME-INR  APTT  CBC  DIFFERENTIAL  RAPID URINE DRUG SCREEN, HOSP PERFORMED  AMMONIA    EKG: EKG Interpretation Date/Time:  Saturday December 28 2023 21:36:36 EDT Ventricular Rate:  89 PR Interval:    QRS Duration:  92 QT Interval:  382 QTC Calculation: 465 R Axis:   62  Text Interpretation: Sinus rhythm Confirmed by Cottie Cough [phone]) on 12/28/2023 10:22:10 PM  Radiology: CT ANGIO HEAD NECK W WO CM (CODE  STROKE) Result Date: 12/28/2023 CLINICAL DATA:  Initial evaluation for acute neuro deficit, stroke. EXAM: CT ANGIOGRAPHY HEAD AND NECK WITH AND WITHOUT CONTRAST TECHNIQUE: Multidetector CT imaging of the head and neck was performed using the standard protocol during bolus administration of intravenous contrast. Multiplanar CT image reconstructions and MIPs were obtained to evaluate the vascular anatomy. Carotid stenosis measurements (when applicable) are obtained utilizing NASCET criteria, using the distal internal carotid diameter as the denominator. RADIATION DOSE REDUCTION: This exam was performed according to the departmental dose-optimization program which includes automated exposure control, adjustment of the mA and/or kV according to patient size and/or use of iterative reconstruction technique. CONTRAST:  75mL OMNIPAQUE  IOHEXOL  350 MG/ML SOLN COMPARISON:  CT from earlier the same day. FINDINGS: CTA NECK FINDINGS Aortic arch: Standard branching. Imaged portion shows no evidence of aneurysm or dissection. No significant stenosis of the major arch vessel origins. Right carotid system: No evidence of dissection, stenosis (50% or greater), or occlusion. Left carotid system: No evidence of dissection, stenosis (50% or greater), or occlusion. Vertebral arteries: No evidence of dissection, stenosis (50% or greater), or occlusion. Skeleton: No worrisome osseous lesions. Moderate to advanced spondylosis at C4-5 through C6-7. Degenerative changes noted about the TMJs bilaterally. Other neck: No other acute finding. Upper chest: 1.3 cm irregular nodular density present along the right major fissure (series 7, image 160), indeterminate. 1.3 cm pulmonary nodule within the right upper lobe. Review of the MIP images confirms the above findings CTA HEAD FINDINGS Anterior circulation: Both internal carotid arteries widely patent to the termini without stenosis. A1 segments widely patent. Normal anterior communicating artery  complex. Both anterior cerebral arteries widely patent to their distal aspects without stenosis. No M1 stenosis or occlusion. Normal MCA bifurcations. Distal MCA branches well perfused and symmetric. Posterior circulation: Both V4 segments patent without stenosis. Right vertebral artery strongly dominant. Both PICA patent. Basilar patent without stenosis. Superior cerebellar and posterior cerebral arteries widely patent bilaterally. Venous sinuses: Not well assessed due to timing the  contrast bolus. Anatomic variants: Dominant left vertebral artery.  No aneurysm. Review of the MIP images confirms the above findings IMPRESSION: 1. Negative CTA of the head and neck. No large vessel occlusion or other emergent finding. No hemodynamically significant or correctable stenosis. 2. 1.3 cm irregular nodular density along the right major fissure, detected on incomplete chest CT. Per Fleischner Society guidelines, recommend prompt noncontrast chest CT for further evaluation. Electronically Signed   By: Morene Hoard M.D.   On: 12/28/2023 21:51   CT HEAD CODE STROKE WO CONTRAST Result Date: 12/28/2023 CLINICAL DATA:  Code stroke. Initial evaluation for acute neuro deficit, stroke suspected, left facial droop. EXAM: CT HEAD WITHOUT CONTRAST TECHNIQUE: Contiguous axial images were obtained from the base of the skull through the vertex without intravenous contrast. RADIATION DOSE REDUCTION: This exam was performed according to the departmental dose-optimization program which includes automated exposure control, adjustment of the mA and/or kV according to patient size and/or use of iterative reconstruction technique. COMPARISON:  CT from 01/09/2022 FINDINGS: Brain: Cerebral volume within normal limits. Patchy hypodensity involving the supratentorial cerebral white matter, consistent chronic small vessel ischemic disease, moderate in nature. No acute intracranial hemorrhage. No acute large vessel territory infarct. No mass  lesion or midline shift. No hydrocephalus or extra-axial fluid collection. Vascular: No abnormal hyperdense vessel. Scattered vascular calcifications noted within the carotid siphons. Skull: Scalp soft tissues within normal limits for calvarium intact. Sinuses/Orbits: Postoperative changes noted at the left lobe. Paranasal sinuses are largely clear. No mastoid effusion. Other: None. ASPECTS [facility] Stroke Program Early CT Score) - Ganglionic level infarction (caudate, lentiform nuclei, internal capsule, insula, M1-M3 cortex): 7 - Supraganglionic infarction (M4-M6 cortex): 3 Total score (0-10 with 10 being normal): 10 IMPRESSION: 1. No acute intracranial abnormality. 2. ASPECTS is 10. 3. Moderate chronic microvascular ischemic disease. These results were communicated to Dr. Vanessa at 9:28 pm on 12/28/2023 by text page via the [facility] messaging system. Electronically Signed   By: Morene Hoard M.D.   On: 12/28/2023 21:29     Procedures   Medications Ordered in the ED  iohexol  (OMNIPAQUE ) 350 MG/ML injection 75 mL (75 mLs Intravenous Contrast Given 12/28/23 2129)  metoCLOPramide  (REGLAN ) injection 5 mg (5 mg Intravenous Given 12/28/23 2159)  ketorolac  (TORADOL ) 15 MG/ML injection 15 mg (15 mg Intravenous Given 12/28/23 2157)  tetracaine (PONTOCAINE) 0.5 % ophthalmic solution 2 drop (2 drops Right Eye Given 12/28/23 2203)  cefTRIAXone  (ROCEPHIN ) 1 g in sodium chloride  0.9 % 100 mL IVPB (1 g Intravenous New Bag/Given 12/28/23 2252)    Clinical Course as of 12/28/23 2324  Sat Dec 28, 2023  2233 IOP 25 on affected eye - this is not consistent with acute angle closure glaucoma.  Pt noncompliant or too confused to perform visual acuity testing. [MT]  2323 Admitted to hospitalist [MT]    Clinical Course User Index [MT] Aniqua Briere, Donnice PARAS, MD                                 Medical Decision Making Amount and/or Complexity of Data Reviewed Labs: ordered. Radiology: ordered.  Risk Prescription  drug management. Decision regarding hospitalization.   This patient presents to the ED with concern for confusion, headache, left-sided weakness. This involves an extensive number of treatment options, and is a complaint that carries with it a high risk of complications and morbidity.  The differential diagnosis includes CVA versus brain bleed versus complex migraine  versus metabolic derangement versus other  Viral illness including COVID infection would also be on the differential for the patient's sudden confusion complaint of headache.  Co-morbidities that complicate the patient evaluation: Advanced age, diabetes or cardiovascular and stroke risk factors  Additional history obtained from EMS and power of attorney at bedside   I ordered and personally interpreted labs.  The pertinent results include: No emergent findings  I ordered imaging studies including CT head, CT angio I independently visualized and interpreted imaging which showed no emergent findings I agree with the radiologist interpretation  The patient was maintained on a cardiac monitor.  I personally viewed and interpreted the cardiac monitored which showed an underlying rhythm of: Sinus rhythm  Per my interpretation the patient's ECG shows no acute ischemia  I ordered medication including Toradol , Reglan  for migraine and headache  I have reviewed the patients home medicines and have made adjustments as needed  Test Considered: At this time the presentation does not appear consistent with infectious etiology and I did not feel the patient required emergent lumbar puncture.  I requested consultation with the neurology,  and discussed lab and imaging findings as well as pertinent plan - they recommend: Admission for encephalopathy and further MRI imaging of the brain  After the interventions noted above, I reevaluated the patient and found that they have: improved  Social Determinants of Health: patient power of  attorney present at the bedside for these discussions  Patient does have some erythema external to the right eye, suggestive of preseptal cellulitis.  It is difficult to ascertain whether she has pain with extraocular movements.  I think that MRI of the right orbit would be reasonable to evaluate for potential orbital cellulitis or posterior infection, in addition to MRI imaging of the brain for her stroke evaluation.  In the meantime, have ordered IV Rocephin  to cover for preseptal cellulitis.  The patient has small leukocytes in her UA with a few bacteria, no clear or convincing evidence otherwise of urinary tract infection.  Disposition:  After consideration of the diagnostic results and the patients response to treatment, I feel that the patient would benefit from admission      Final diagnoses:  Confusion  Left-sided weakness  Aphasia  Cellulitis, face    ED Discharge Orders     None          Addalee Kavanagh, Donnice PARAS, MD 12/28/23 2324

## 2023-12-29 ENCOUNTER — Observation Stay (HOSPITAL_COMMUNITY)

## 2023-12-29 ENCOUNTER — Other Ambulatory Visit (HOSPITAL_COMMUNITY): Payer: Self-pay

## 2023-12-29 ENCOUNTER — Encounter (HOSPITAL_COMMUNITY): Payer: Self-pay | Admitting: Internal Medicine

## 2023-12-29 DIAGNOSIS — I69391 Dysphagia following cerebral infarction: Secondary | ICD-10-CM

## 2023-12-29 DIAGNOSIS — R29818 Other symptoms and signs involving the nervous system: Secondary | ICD-10-CM | POA: Diagnosis not present

## 2023-12-29 DIAGNOSIS — R410 Disorientation, unspecified: Secondary | ICD-10-CM | POA: Diagnosis not present

## 2023-12-29 DIAGNOSIS — G459 Transient cerebral ischemic attack, unspecified: Secondary | ICD-10-CM | POA: Insufficient documentation

## 2023-12-29 DIAGNOSIS — G4489 Other headache syndrome: Secondary | ICD-10-CM | POA: Diagnosis not present

## 2023-12-29 DIAGNOSIS — Z7401 Bed confinement status: Secondary | ICD-10-CM | POA: Diagnosis not present

## 2023-12-29 DIAGNOSIS — R112 Nausea with vomiting, unspecified: Secondary | ICD-10-CM

## 2023-12-29 DIAGNOSIS — R911 Solitary pulmonary nodule: Secondary | ICD-10-CM | POA: Diagnosis not present

## 2023-12-29 DIAGNOSIS — G9340 Encephalopathy, unspecified: Secondary | ICD-10-CM

## 2023-12-29 DIAGNOSIS — R4701 Aphasia: Secondary | ICD-10-CM | POA: Diagnosis not present

## 2023-12-29 DIAGNOSIS — R531 Weakness: Secondary | ICD-10-CM | POA: Diagnosis not present

## 2023-12-29 DIAGNOSIS — R519 Headache, unspecified: Secondary | ICD-10-CM

## 2023-12-29 LAB — CBG MONITORING, ED
Glucose-Capillary: 130 mg/dL — ABNORMAL HIGH (ref 70–99)
Glucose-Capillary: 98 mg/dL (ref 70–99)
Glucose-Capillary: 100 mg/dL — ABNORMAL HIGH (ref 70–99)

## 2023-12-29 LAB — BASIC METABOLIC PANEL WITH GFR
Anion gap: 14 (ref 5–15)
BUN: 9 mg/dL (ref 8–23)
CO2: 20 mmol/L — ABNORMAL LOW (ref 22–32)
Calcium: 8.8 mg/dL — ABNORMAL LOW (ref 8.9–10.3)
Chloride: 98 mmol/L (ref 98–111)
Creatinine, Ser: 0.72 mg/dL (ref 0.44–1.00)
GFR, Estimated: >60 mL/min (ref >60)
Glucose, Bld: 126 mg/dL — ABNORMAL HIGH (ref 70–99)
Potassium: 4 mmol/L (ref 3.5–5.1)
Sodium: 132 mmol/L — ABNORMAL LOW (ref 135–145)

## 2023-12-29 LAB — LIPASE, BLOOD: Lipase: 14 U/L (ref 11–51)

## 2023-12-29 LAB — ECHOCARDIOGRAM COMPLETE
Area-P 1/2: 3.42 cm2
Est EF: >75
Height: 63 in
S' Lateral: 3 cm
Weight: 2342.17 [oz_av]

## 2023-12-29 LAB — LIPID PANEL
Cholesterol: 94 mg/dL (ref 0–200)
HDL: 33 mg/dL — ABNORMAL LOW (ref >40)
LDL Cholesterol: 46 mg/dL (ref 0–99)
Total CHOL/HDL Ratio: 2.8 ratio
Triglycerides: 77 mg/dL (ref <150)
VLDL: 15 mg/dL (ref 0–40)

## 2023-12-29 LAB — AMMONIA: Ammonia: <13 umol/L (ref 9–35)

## 2023-12-29 LAB — HEMOGLOBIN A1C
Hgb A1c MFr Bld: 5.2 % (ref 4.8–5.6)
Mean Plasma Glucose: 102.54 mg/dL

## 2023-12-29 MED ORDER — ACETAMINOPHEN 325 MG PO TABS
650.0000 mg | ORAL_TABLET | Freq: Four times a day (QID) | ORAL | Status: DC | PRN
  Administered 2023-12-29: 650 mg via ORAL
  Filled 2023-12-29: qty 2

## 2023-12-29 MED ORDER — SODIUM CHLORIDE 0.9 % IV SOLN: 1.0000 g | INTRAVENOUS | Status: DC

## 2023-12-29 MED ORDER — ASPIRIN 81 MG PO TBEC: 81.0000 mg | DELAYED_RELEASE_TABLET | Freq: Every day | ORAL | 0 refills | Status: AC

## 2023-12-29 MED ORDER — ASPIRIN 300 MG RE SUPP
300.0000 mg | Freq: Every day | RECTAL | Status: DC
  Administered 2023-12-29: 300 mg via RECTAL
  Filled 2023-12-29: qty 1

## 2023-12-29 MED ORDER — INSULIN ASPART 100 UNIT/ML IJ SOLN
0.0000 [IU] | INTRAMUSCULAR | Status: DC
  Administered 2023-12-29: 1 [IU] via SUBCUTANEOUS

## 2023-12-29 MED ORDER — VANCOMYCIN HCL 1250 MG/250ML IV SOLN
1250.0000 mg | Freq: Once | INTRAVENOUS | Status: AC
  Administered 2023-12-29: 1250 mg via INTRAVENOUS
  Filled 2023-12-29: qty 250

## 2023-12-29 MED ORDER — LATANOPROST 0.005 % OP SOLN
1.0000 [drp] | Freq: Every day | OPHTHALMIC | Status: DC
  Filled 2023-12-29: qty 2.5

## 2023-12-29 MED ORDER — ENOXAPARIN SODIUM 40 MG/0.4ML IJ SOSY: 40.0000 mg | PREFILLED_SYRINGE | INTRAMUSCULAR | Status: DC

## 2023-12-29 MED ORDER — CLOPIDOGREL BISULFATE 75 MG PO TABS: 75.0000 mg | ORAL_TABLET | Freq: Every day | ORAL | Status: DC

## 2023-12-29 MED ORDER — TIMOLOL MALEATE 0.5 % OP SOLN
1.0000 [drp] | Freq: Two times a day (BID) | OPHTHALMIC | Status: DC
  Administered 2023-12-29: 1 [drp] via OPHTHALMIC
  Filled 2023-12-29: qty 5

## 2023-12-29 MED ORDER — VANCOMYCIN HCL IN DEXTROSE 1-5 GM/200ML-% IV SOLN: 1000.0000 mg | INTRAVENOUS | Status: DC

## 2023-12-29 MED ORDER — GADOBUTROL 1 MMOL/ML IV SOLN
6.6000 mL | Freq: Once | INTRAVENOUS | Status: AC | PRN
  Administered 2023-12-29: 6.6 mL via INTRAVENOUS

## 2023-12-29 MED ORDER — SODIUM CHLORIDE 0.9 % IV SOLN: INTRAVENOUS | Status: AC

## 2023-12-29 MED ORDER — ASPIRIN 81 MG PO TBEC: 81.0000 mg | DELAYED_RELEASE_TABLET | Freq: Every day | ORAL | Status: DC

## 2023-12-29 MED ORDER — CLOPIDOGREL BISULFATE 75 MG PO TABS: 75.0000 mg | ORAL_TABLET | Freq: Every day | ORAL | 0 refills | Status: AC

## 2023-12-29 MED ORDER — ACETAMINOPHEN 650 MG RE SUPP: 650.0000 mg | Freq: Four times a day (QID) | RECTAL | Status: DC | PRN

## 2023-12-29 MED ORDER — LORAZEPAM 2 MG/ML IJ SOLN
1.0000 mg | Freq: Once | INTRAMUSCULAR | Status: AC
Start: 2023-12-29 — End: 2023-12-29
  Administered 2023-12-29: 1 mg via INTRAVENOUS
  Filled 2023-12-29: qty 1

## 2023-12-29 MED ORDER — PILOCARPINE HCL 1 % OP SOLN
1.0000 [drp] | Freq: Four times a day (QID) | OPHTHALMIC | Status: DC
  Administered 2023-12-29: 1 [drp] via OPHTHALMIC
  Filled 2023-12-29: qty 15

## 2023-12-29 NOTE — Evaluation (Signed)
 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   Bronchitis    GERD (gastroesophageal reflux disease)    Glaucoma, right eye    CLOSED ANGLE   History of kidney stones    Hypertension    Nausea and vomiting 12/29/2023   OA (osteoarthritis)    KNEES , HANDS   OAB (overactive bladder)    OSA on CPAP    Osteoporosis    Type 2 diabetes mellitus (HCC)    FOLLOWED BY PCP   Wears glasses    Past Surgical History:  Past Surgical History:  Procedure Laterality Date   CHOLECYSTECTOMY     CYST EXCISION  08/2017   POSTERIOR NECK   CYSTOSCOPY WITH RETROGRADE PYELOGRAM, URETEROSCOPY AND STENT PLACEMENT Left 06/16/2018   Procedure: diagnostic URETEROSCOPY AND STENT PLACEMENT;  Surgeon: Sherrilee Belvie CROME, MD;  Location: [facility];  Service: Urology;  Laterality: Left;  1 HR   EXCISION MASS UPPER EXTREMETIES Right 11/15/2021   Procedure: EXCISION OF SUBCUTANEOUS MASS RIGHT SHOULDER;  Surgeon: Signe Mitzie LABOR, MD;  Location: [facility];  Service: General;  Laterality: Right;   EYE SURGERY Left 1992   REMOVE EYE AND PLACEMENT PROSTHESIS   KNEE ARTHROPLASTY Right 12/12/2017   Procedure: RIGHT TOTAL KNEE ARTHROPLASTY WITH COMPUTER NAVIGATION;  Surgeon: Fidel Rogue, MD;  Location: [facility];  Service: Orthopedics;  Laterality: Right;  Needs RNFA   TONSILLECTOMY  AGE 10   HPI:  78 y.o. female presents to [facility] hospital on 12/28/2023 with HA, vomiting, AMS and L weakness. CT head negative. PMH includes dementia, HTN, HLD, DMII, OSA, glaucoma, GERD, mood disorder. RN reports that she passed one yale, but failed another.    Assessment / Plan / Recommendation  Clinical Impression   Pt seen  for skilled ST evaluation to determine PO readiness. Currently NPO, was assessed with thins, purees, and regular solids. OME seemingly WNL, somewhat limited due to inconsistent direction following. Referred to speech after she passed her initial yale then failed a secondary yale. She given full assist with feeding, she consumed all consistencies with no overt s/s of aspiration. She did exhibit difficulty with mastication, with prolonged oral holding, tongue pumping, delayed swallow initiation, and a double once with puree. She had mild oral residue after normal solids which was adequately cleared after a liquid wash. The features of her swallow are common for dementia related oropharyngeal deficits, but overall her swallow was functional. Recommend a diet of dys 2/thin with STRICT aspiration precautions (small bites/sips, eat/drink slowly, cued liquid wash, sit upright and stay upright) with FREQUENT oral care. SLP to f/u for diet toleration/management and pt and family education of compensatory strategies.   SLP Visit Diagnosis: Dysphagia, unspecified (R13.10)    Aspiration Risk  No limitations    Diet Recommendation Dysphagia 2 (Fine chop);Thin liquid    Liquid Administration via: Straw Medication Administration: Whole meds with liquid Supervision: Full supervision/cueing for compensatory strategies;Staff to assist with self feeding Compensations: Minimize environmental distractions;Small sips/bites;Slow rate;Follow solids with liquid Postural Changes: Seated upright at 90 degrees;Remain upright for at least 30 minutes after po intake    Other  Recommendations Oral Care Recommendations: Oral care QID  Assistance Recommended at Discharge    Functional Status Assessment Patient has had a recent decline in their functional status and demonstrates the ability to make significant improvements in function in a reasonable and predictable amount of time.  Frequency and Duration min 2x/week  1  week       Prognosis Prognosis for improved oropharyngeal function: Good Barriers to Reach Goals: Cognitive deficits      Swallow Study   General Date of Onset: 12/28/23 HPI: 78 y.o. female presents to [facility] hospital on 12/28/2023 with HA, vomiting, AMS and L weakness. CT head negative. PMH includes dementia, HTN, HLD, DMII, OSA, glaucoma, GERD, mood disorder. RN reports that she passed one yale, but failed another. Type of Study: Bedside Swallow Evaluation Diet Prior to this Study: NPO Respiratory Status: Room air History of Recent Intubation: No Behavior/Cognition: Cooperative;Pleasant mood;Confused;Distractible Oral Cavity Assessment: Within Functional Limits Oral Care Completed by SLP: No Oral Cavity - Dentition: Adequate natural dentition;Poor condition Vision: Impaired for self-feeding Self-Feeding Abilities: Total assist Patient Positioning: Upright in bed Baseline Vocal Quality: Low vocal intensity Volitional Cough: Weak    Oral/Motor/Sensory Function     Ice Chips     Thin Liquid Thin Liquid: Within functional limits    Nectar Thick     Honey Thick     Puree Puree: Within functional limits   Solid     Solid: Within functional limits     Lindsey Baldwin M.S. CCC-SLP

## 2023-12-29 NOTE — Progress Notes (Signed)
 Pharmacy Antibiotic Note  Lindsey Baldwin is a 78 y.o. female admitted on 12/28/2023 with cellulitis.  Pharmacy has been consulted for vancomycin  dosing.  Plan: Vancomycin  1250mg  x1 then 1000mg  IV Q24H. Goal AUC 400-550.  Expected AUC 460.  SCr used 0.8.  Height: 5' 3 (160 cm) Weight: 66.4 kg (146 lb 6.2 oz) IBW/kg (Calculated) : 52.4  Temp (24hrs), Avg:97.9 F (36.6 C), Min:96.9 F (36.1 C), Max:98.9 F (37.2 C)  Recent Labs  Lab 12/28/23 2112 12/28/23 2119  WBC 7.2  --   CREATININE 0.67 0.60    Estimated Creatinine Clearance: 54.8 mL/min (by C-G formula based on SCr of 0.6 mg/dL).    Allergies  Allergen Reactions   Apraclonidine  Hcl Other (See Comments)    Unknown reaction Pt does not recall **not listed on the MAR**   Penicillins Swelling and Other (See Comments)    Has patient had a PCN reaction causing immediate rash, facial/tongue/throat swelling, SOB or lightheadedness with hypotension: YES Has patient had a PCN reaction causing severe rash involving mucus membranes or skin necrosis: NO Has patient had a PCN reaction that required hospitalization: NO Has patient had a PCN reaction occurring within the last 10 years: NO If all of the above answers are NO, then may proceed with Cephalosporin use.    Sulfa Antibiotics Rash    Thank you for allowing pharmacy to be a part of this patient's care.  Marvetta Dauphin, PharmD, BCPS  12/29/2023 1:46 AM

## 2023-12-29 NOTE — Progress Notes (Signed)
*  PRELIMINARY RESULTS* Echocardiogram 2D Echocardiogram has been performed.  Lindsey Baldwin 12/29/2023, 1:33 PM

## 2023-12-29 NOTE — Evaluation (Signed)
 Occupational Therapy Evaluation Patient Details Name: Lindsey Baldwin MRN: [id number] DOB: 1946/02/21 Today's Date: 12/29/2023   History of Present Illness   78 y.o. female presents to [facility] hospital on 12/28/2023 with HA, vomiting, AMS and L weakness. CT and MRI head negative. PMH includes dementia, HTN, HLD, DMII, OSA, glaucoma, GERD, mood disorder.     Clinical Impressions Lindsey Baldwin was evaluated s/p the above admission list. She resides at [facility] at baseline, she reports WC use and assist for most ADLs. Upon evaluation the pt was limited by baseline dementia, fear of falling, weakness, L inattention, unsteady balance and poor activity tolerance. Overall she needed min A for bed mobility and mod A to stand and take some side steps at the EOB with HHA. Due to the deficits listed below the pt also needs max A for LB ADLs and min A for UB ADLs with cues for all functional tasks. Pt will benefit from continued acute OT services and skilled inpatient follow up therapy, <3 hours/day.      If plan is discharge home, recommend the following:   A lot of help with bathing/dressing/bathroom;Assistance with cooking/housework;A lot of help with walking and/or transfers;Direct supervision/assist for medications management;Direct supervision/assist for financial management;Assist for transportation;Help with stairs or ramp for entrance;Supervision due to cognitive status     Functional Status Assessment   Patient has had a recent decline in their functional status and demonstrates the ability to make significant improvements in function in a reasonable and predictable amount of time.     Equipment Recommendations   None recommended by OT      Precautions/Restrictions   Precautions Precautions: Fall Recall of Precautions/Restrictions: Impaired Restrictions Weight Bearing Restrictions Per Provider Order: No     Mobility Bed Mobility Overal bed mobility: Needs Assistance Bed Mobility:  Rolling, Sidelying to Sit, Sit to Supine Rolling: Mod assist Sidelying to sit: Min assist, +2 for physical assistance   Sit to supine: +2 for physical assistance, Max assist        Transfers Overall transfer level: Needs assistance Equipment used: 2 person hand held assist Transfers: Sit to/from Stand Sit to Stand: Mod assist, +2 physical assistance, From elevated surface           General transfer comment: posterior lean with initial standing      Balance Overall balance assessment: Needs assistance Sitting-balance support: Single extremity supported, Feet unsupported Sitting balance-Leahy Scale: Poor Sitting balance - Comments: minA-CGA, posterior lean Postural control: Posterior lean Standing balance support: Bilateral upper extremity supported Standing balance-Leahy Scale: Poor Standing balance comment: min-modA, posterior lean                           ADL either performed or assessed with clinical judgement   ADL Overall ADL's : Needs assistance/impaired Eating/Feeding: Set up;Sitting   Grooming: Minimal assistance;Sitting   Upper Body Bathing: Minimal assistance;Sitting   Lower Body Bathing: Maximal assistance;Sit to/from stand   Upper Body Dressing : Minimal assistance;Sitting   Lower Body Dressing: Maximal assistance;Sit to/from stand   Toilet Transfer: Moderate assistance;+2 for physical assistance;+2 for safety/equipment;Stand-pivot;BSC/3in1   Toileting- Clothing Manipulation and Hygiene: Total assistance;Sit to/from stand       Functional mobility during ADLs: Moderate assistance;+2 for physical assistance;+2 for safety/equipment General ADL Comments: Pt requires simple 1 step cues for all tasks     Vision Baseline Vision/History: 2 Legally blind;3 Glaucoma Patient Visual Report: No change from baseline Additional Comments: difficult to assess -  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 She is able to see number of fingers  and read badge     Perception Perception: Impaired Preception Impairment Details: Inattention/Neglect Perception-Other Comments: L inattention   Praxis Praxis: Not tested       Pertinent Vitals/Pain Pain Assessment Pain Assessment: No/denies pain     Extremity/Trunk Assessment Upper Extremity Assessment Upper Extremity Assessment: LUE deficits/detail;RUE deficits/detail RUE Deficits / Details: generalized weakness but using WFL, hand is wrapped due to an old injury per pt LUE Deficits / Details: ROM is WFL, strength is grossly 4/5. Pt is inattentive to the LUE. She holds it in a flexed posture guarded against her body until cued otherwise. FM and dexterity were difficult to assess. Pt denies paraesthesias but then later stated that her 2nd digit is numb LUE Sensation: decreased light touch;decreased proprioception LUE Coordination: decreased fine motor   Lower Extremity Assessment Lower Extremity Assessment: Defer to PT evaluation   Cervical / Trunk Assessment Cervical / Trunk Assessment: Kyphotic   Communication Communication Communication: Impaired Factors Affecting Communication: Reduced clarity of speech   Cognition Arousal: Alert Behavior During Therapy: WFL for tasks assessed/performed Cognition: History of cognitive impairments             OT - Cognition Comments: dementia hx, pt follow most simple 1 step commands and answered all PLOF questions. She is pleasantly confused at times. Significant fear of falling. L inattention                 Following commands: Impaired Following commands impaired: Follows one step commands with increased time, Follows multi-step commands inconsistently     Cueing  General Comments   Cueing Techniques: Verbal cues;Tactile cues;Gestural cues  VSS           Home Living Family/patient expects to be discharged to:: Skilled nursing facility             Additional Comments: residing at [facility] place for ~1 year  per the pt      Prior Functioning/Environment Prior Level of Function : Needs assist;Patient poor historian/Family not available             Mobility Comments: pt reports she had been ambulatory with a rollator until a few weeks ago, has not been able to ambulate since then and has been having staff assistance transfers and supervision for wheelchair mobility ADLs Comments: assistance for bathing, toileting. pt reports set up for dressing and feeding    OT Problem List: Decreased strength;Decreased range of motion;Decreased activity tolerance;Impaired balance (sitting and/or standing);Decreased safety awareness;Decreased knowledge of use of DME or AE;Decreased knowledge of precautions   OT Treatment/Interventions: Self-care/ADL training;Therapeutic exercise;DME and/or AE instruction;Therapeutic activities;Balance training;Patient/family education      OT Goals(Current goals can be found in the care plan section)   Acute Rehab OT Goals Patient Stated Goal: to eat OT Goal Formulation: With patient Time For Goal Achievement: 01/12/24 Potential to Achieve Goals: Good ADL Goals Pt Will Perform Grooming: standing;with supervision Pt Will Perform Upper Body Dressing: with set-up Pt Will Perform Lower Body Dressing: with min assist;sit to/from stand Additional ADL Goal #1: Pt will use LUE as a functional assist during ADLs and mobility independently   OT Frequency:  Min 2X/week    Co-evaluation PT/OT/SLP Co-Evaluation/Treatment: Yes Reason for Co-Treatment: Complexity of the patient's impairments (multi-system involvement);To address functional/ADL transfers;For patient/therapist safety          AM-PAC OT 6 Clicks Daily Activity  Outcome Measure Help from another person eating meals?: A Little Help from another person taking care of personal grooming?: A Little Help from another person toileting, which includes using toliet, bedpan, or urinal?: A Lot Help from another  person bathing (including washing, rinsing, drying)?: A Lot Help from another person to put on and taking off regular upper body clothing?: A Little Help from another person to put on and taking off regular lower body clothing?: A Lot 6 Click Score: 15   End of Session Equipment Utilized During Treatment: Gait belt Nurse Communication: Mobility status  Activity Tolerance: Patient tolerated treatment well Patient left: in bed;with call bell/phone within reach  OT Visit Diagnosis: Unsteadiness on feet (R26.81);Other abnormalities of gait and mobility (R26.89);Muscle weakness (generalized) (M62.81)                Time: 8873-8854 OT Time Calculation (min): 19 min Charges:  OT General Charges $OT Visit: 1 Visit OT Evaluation $OT Eval Moderate Complexity: 1 Mod  Lucie Kendall, OTR/L Acute Rehabilitation Services Office [phone] Secure Chat Communication Preferred   Lucie JONETTA Kendall 12/29/2023, 12:47 PM

## 2023-12-29 NOTE — Evaluation (Signed)
 Physical Therapy Evaluation Patient Details Name: Lindsey Baldwin MRN: [id number] DOB: Feb 08, 1946 Today's Date: 12/29/2023  History of Present Illness  78 y.o. female presents to [facility] hospital on 12/28/2023 with HA, vomiting, AMS and L weakness. CT head negative. PMH includes dementia, HTN, HLD, DMII, OSA, glaucoma, GERD, mood disorder.  Clinical Impression  Pt presents to PT with deficits in functional mobility, strength, power, balance. Pt demonstrates generalized weakness and impaired attention to and utilization of LUE. Pt requires physical assistance for all functional mobility and demonstrates a tendency for a posterior lean in sitting and standing. Pt remains at a high risk for falls at this time. Patient will benefit from continued inpatient follow up therapy, <3 hours/day.        If plan is discharge home, recommend the following: A lot of help with walking and/or transfers;A lot of help with bathing/dressing/bathroom;Assistance with cooking/housework;Direct supervision/assist for medications management;Direct supervision/assist for financial management;Assistance with feeding;Assist for transportation;Help with stairs or ramp for entrance;Supervision due to cognitive status   Can travel by private vehicle   No    Equipment Recommendations  (defer to post-acute setting)  Recommendations for Other Services       Functional Status Assessment Patient has had a recent decline in their functional status and demonstrates the ability to make significant improvements in function in a reasonable and predictable amount of time.     Precautions / Restrictions Precautions Precautions: Fall Recall of Precautions/Restrictions: Impaired Restrictions Weight Bearing Restrictions Per Provider Order: No      Mobility  Bed Mobility Overal bed mobility: Needs Assistance Bed Mobility: Rolling, Sidelying to Sit, Sit to Supine Rolling: Mod assist Sidelying to sit: Min assist, +2 for physical  assistance   Sit to supine: +2 for physical assistance, Max assist        Transfers Overall transfer level: Needs assistance Equipment used: 2 person hand held assist Transfers: Sit to/from Stand Sit to Stand: Mod assist, +2 physical assistance, From elevated surface           General transfer comment: posterior lean with initial standing    Ambulation/Gait Ambulation/Gait assistance: Mod assist, +2 physical assistance Gait Distance (Feet): 2 Feet Assistive device: 2 person hand held assist Gait Pattern/deviations: Shuffle Gait velocity: reduced Gait velocity interpretation: <1.31 ft/sec, indicative of household ambulator   General Gait Details: shuffling to right side at edge of bed, assistance provided at trunk to initiate weight shift laterally  Stairs            Wheelchair Mobility     Tilt Bed    Modified Rankin (Stroke Patients Only) Modified Rankin (Stroke Patients Only) Pre-Morbid Rankin Score: Moderately severe disability Modified Rankin: Moderately severe disability     Balance Overall balance assessment: Needs assistance Sitting-balance support: Single extremity supported, Feet unsupported Sitting balance-Leahy Scale: Poor Sitting balance - Comments: minA-CGA, posterior lean Postural control: Posterior lean Standing balance support: Bilateral upper extremity supported Standing balance-Leahy Scale: Poor Standing balance comment: min-modA, posterior lean                             Pertinent Vitals/Pain Pain Assessment Pain Assessment: No/denies pain    Home Living Family/patient expects to be discharged to:: Skilled nursing facility                   Additional Comments: residing at [facility] place for ~1 year per the pt    Prior Function Prior Level of  Function : Needs assist             Mobility Comments: pt reports she had been ambulatory with a rollator until a few weeks ago, has not been able to ambulate since  then and has been having staff assistance transfers and supervision for wheelchair mobility ADLs Comments: assistance for bathing, toileting. pt reports set up for dressing and feeding     Extremity/Trunk Assessment   Upper Extremity Assessment Upper Extremity Assessment: Defer to OT evaluation    Lower Extremity Assessment Lower Extremity Assessment: Generalized weakness    Cervical / Trunk Assessment Cervical / Trunk Assessment: Kyphotic  Communication   Communication Communication: Impaired Factors Affecting Communication: Reduced clarity of speech    Cognition Arousal: Alert Behavior During Therapy: WFL for tasks assessed/performed   PT - Cognitive impairments: No family/caregiver present to determine baseline, Awareness, Memory, Problem solving, Safety/Judgement, Attention                         Following commands: Impaired Following commands impaired: Follows one step commands with increased time, Follows multi-step commands inconsistently     Cueing Cueing Techniques: Verbal cues, Tactile cues, Gestural cues     General Comments General comments (skin integrity, edema, etc.): VSS on RA. 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    Exercises     Assessment/Plan    PT Assessment Patient needs continued PT services  PT Problem List Decreased strength;Decreased activity tolerance;Decreased balance;Decreased mobility;Decreased cognition;Decreased knowledge of use of DME;Decreased safety awareness;Decreased knowledge of precautions       PT Treatment Interventions DME instruction;Gait training;Functional mobility training;Therapeutic activities;Therapeutic exercise;Balance training;Neuromuscular re-education;Cognitive remediation;Patient/family education;Wheelchair mobility training    PT Goals (Current goals can be found in the Care Plan section)  Acute Rehab PT Goals Patient Stated Goal: to walk again PT Goal  Formulation: With patient Time For Goal Achievement: 01/12/24 Potential to Achieve Goals: Poor    Frequency Min 2X/week     Co-evaluation               AM-PAC PT 6 Clicks Mobility  Outcome Measure Help needed turning from your back to your side while in a flat bed without using bedrails?: A Lot Help needed moving from lying on your back to sitting on the side of a flat bed without using bedrails?: A Lot Help needed moving to and from a bed to a chair (including a wheelchair)?: A Lot Help needed standing up from a chair using your arms (e.g., wheelchair or bedside chair)?: A Lot Help needed to walk in hospital room?: Total Help needed climbing 3-5 steps with a railing? : Total 6 Click Score: 10    End of Session Equipment Utilized During Treatment: Gait belt Activity Tolerance: Patient tolerated treatment well Patient left: in bed;with call bell/phone within reach;with bed alarm set Nurse Communication: Mobility status PT Visit Diagnosis: Other abnormalities of gait and mobility (R26.89);Muscle weakness (generalized) (M62.81);History of falling (Z91.81)    Time: 8873-8855 PT Time Calculation (min) (ACUTE ONLY): 18 min   Charges:   PT Evaluation $PT Eval Low Complexity: 1 Low   PT General Charges $$ ACUTE PT VISIT: 1 Visit         Bernardino JINNY Ruth, PT, DPT Acute Rehabilitation Office [phone]   Bernardino JINNY Ruth 12/29/2023, 12:16 PM

## 2023-12-29 NOTE — Progress Notes (Incomplete)
 Patient gets nauseous when we try to lay her flat. Please let us  know when she has premeds ready and we will try her again.

## 2023-12-29 NOTE — Progress Notes (Addendum)
 STROKE TEAM PROGRESS NOTE    INTERIM HISTORY/SUBJECTIVE  Patient has remained hemodynamically stable and afebrile overnight.  Speech has improved and is close to baseline per her power of attorney.  OBJECTIVE  CBC    Component Value Date/Time   WBC 7.2 12/28/2023 2112   RBC 4.17 12/28/2023 2112   HGB 12.9 12/28/2023 2119   HCT 38.0 12/28/2023 2119   PLT 153 12/28/2023 2112   MCV 91.4 12/28/2023 2112   MCH 30.9 12/28/2023 2112   MCHC 33.9 12/28/2023 2112   RDW 12.6 12/28/2023 2112   LYMPHSABS 1.8 12/28/2023 2112   MONOABS 0.6 12/28/2023 2112   EOSABS 0.2 12/28/2023 2112   BASOSABS 0.0 12/28/2023 2112    BMET    Component Value Date/Time   NA 132 (L) 12/29/2023 0424   K 4.0 12/29/2023 0424   CL 98 12/29/2023 0424   CO2 20 (L) 12/29/2023 0424   GLUCOSE 126 (H) 12/29/2023 0424   BUN 9 12/29/2023 0424   CREATININE 0.72 12/29/2023 0424   CALCIUM  8.8 (L) 12/29/2023 0424   GFRNONAA >60 12/29/2023 0424    IMAGING past 24 hours ECHOCARDIOGRAM COMPLETE Result Date: 12/29/2023    ECHOCARDIOGRAM REPORT   Patient Name:   Lindsey Baldwin Date of Exam: 12/29/2023 Medical Rec #:  [id number]       Height:       63.0 in Accession #:    [id number]      Weight:       146.4 lb Date of Birth:  [birth year]       BSA:          1.693 m Patient Age:    78 years        BP:           134/77 mmHg Patient Gender: F               HR:           92 bpm. Exam Location:  Inpatient Procedure: 2D Echo, Cardiac Doppler, Color Doppler and Strain Analysis (Both            Spectral and Color Flow Doppler were utilized during procedure). Indications:    Stroke l63.9  History:        Patient has no prior history of Echocardiogram examinations.                 Risk Factors:Hypertension, Diabetes and Dyslipidemia. Hx of                 COVID-19 and OSA on CPAP.  Sonographer:    Aida Pizza RCS Referring Phys: [id number] [facility] XU  Sonographer Comments: Global longitudinal strain was attempted. IMPRESSIONS  1. Left  ventricular ejection fraction, by estimation, is >75%. The left ventricle has hyperdynamic function. The left ventricle has no regional wall motion abnormalities. Left ventricular diastolic parameters are consistent with Grade I diastolic dysfunction (impaired relaxation).  2. Right ventricular systolic function is normal. The right ventricular size is normal.  3. The mitral valve is normal in structure. No evidence of mitral valve regurgitation. No evidence of mitral stenosis.  4. The aortic valve is tricuspid. Aortic valve regurgitation is trivial. Aortic valve sclerosis is present, with no evidence of aortic valve stenosis.  5. The inferior vena cava is normal in size with greater than 50% respiratory variability, suggesting right atrial pressure of 3 mmHg. Comparison(s): No prior Echocardiogram. FINDINGS  Left Ventricle: Left ventricular ejection fraction, by estimation, is >75%. The left  ventricle has hyperdynamic function. The left ventricle has no regional wall motion abnormalities. Strain was performed and the global longitudinal strain is indeterminate. The left ventricular internal cavity size was normal in size. There is no left ventricular hypertrophy. Left ventricular diastolic parameters are consistent with Grade I diastolic dysfunction (impaired relaxation). Right Ventricle: The right ventricular size is normal. Right ventricular systolic function is normal. Left Atrium: Left atrial size was normal in size. Right Atrium: Right atrial size was normal in size. Pericardium: There is no evidence of pericardial effusion. Presence of epicardial fat layer. Mitral Valve: The mitral valve is normal in structure. Mild mitral annular calcification. No evidence of mitral valve regurgitation. No evidence of mitral valve stenosis. Tricuspid Valve: The tricuspid valve is normal in structure. Tricuspid valve regurgitation is trivial. No evidence of tricuspid stenosis. Aortic Valve: The aortic valve is tricuspid.  Aortic valve regurgitation is trivial. Aortic valve sclerosis is present, with no evidence of aortic valve stenosis. Pulmonic Valve: The pulmonic valve was normal in structure. Pulmonic valve regurgitation is trivial. No evidence of pulmonic stenosis. Aorta: The aortic root is normal in size and structure. Venous: The inferior vena cava is normal in size with greater than 50% respiratory variability, suggesting right atrial pressure of 3 mmHg. IAS/Shunts: No atrial level shunt detected by color flow Doppler.  LEFT VENTRICLE PLAX 2D LVIDd:         3.80 cm   Diastology LVIDs:         3.00 cm   LV e' medial:    7.94 cm/s LV PW:         0.90 cm   LV E/e' medial:  10.7 LV IVS:        0.90 cm   LV e' lateral:   9.79 cm/s LVOT diam:     1.80 cm   LV E/e' lateral: 8.6 LV SV:         67 LV SV Index:   39 LVOT Area:     2.54 cm  RIGHT VENTRICLE RV S prime:     12.40 cm/s TAPSE (M-mode): 2.0 cm LEFT ATRIUM             Index        RIGHT ATRIUM           Index LA diam:        2.60 cm 1.54 cm/m   RA Area:     13.50 cm LA Vol (A2C):   41.3 ml 24.39 ml/m  RA Volume:   30.30 ml  17.89 ml/m LA Vol (A4C):   68.5 ml 40.45 ml/m LA Biplane Vol: 54.2 ml 32.01 ml/m  AORTIC VALVE LVOT Vmax:   125.00 cm/s LVOT Vmean:  85.800 cm/s LVOT VTI:    0.262 m  AORTA Ao Root diam: 3.30 cm MITRAL VALVE MV Area (PHT): 3.42 cm    SHUNTS MV Decel Time: 222 msec    Systemic VTI:  0.26 m MV E velocity: 84.60 cm/s  Systemic Diam: 1.80 cm MV A velocity: 96.20 cm/s MV E/A ratio:  0.88 Redell Shallow MD Electronically signed by Redell Shallow MD Signature Date/Time: 12/29/2023/1:38:41 PM    Final    MR BRAIN WO CONTRAST Result Date: 12/29/2023 EXAM: MRI BRAIN WITHOUT CONTRAST 12/29/2023 11:36:01 AM TECHNIQUE: Multiplanar multisequence MRI of the head/brain was performed without the administration of intravenous contrast. COMPARISON: None available. CLINICAL HISTORY: Neuro deficit, acute, stroke suspected. FINDINGS: BRAIN AND VENTRICLES: No acute  infarct. No intracranial hemorrhage. No mass. No  midline shift. No hydrocephalus. Periventricular and scattered subcortical T2 hyperintensities are mildly advanced for age. Those treatment changes of the left lobe are noted. The sella is unremarkable. Normal flow voids. Cavernous sinus is normal. No other signs of increased intracranial pressures are present. ORBITS: A weighted lid is present. Right lens replacement is present. Mild prominence of the superior ophthalmic vein is present bilaterally. SINUSES AND MASTOIDS: A small left mastoid effusion is present. No obstructing nasopharyngeal lesion is present. BONES AND SOFT TISSUES: Normal marrow signal. No acute soft tissue abnormality. IMPRESSION: 1. No acute infarct or intracranial hemorrhage. 2. Mildly advanced periventricular and scattered subcortical T2 hyperintensities for age. 3. Small left mastoid effusion. Electronically signed by: Lonni Necessary MD 12/29/2023 12:00 PM EDT RP Workstation: [id number]   MR ORBITS W WO CONTRAST Result Date: 12/29/2023 EXAM: MRI ORBITS WITHOUT AND WITH CONTRAST 12/29/2023 10:59:08 AM TECHNIQUE: Multiplanar, multisequence MRI of the orbits was performed without and with intravenous contrast. 6.6 mL gadobutrol (GADAVIST) 1 MMOL/ML injection. 6.6 mL GADOBUTROL 1 MMOL/ML IV SOLN. COMPARISON: None available CLINICAL HISTORY: Periorbital cellulitis; pt has right preseptal erythema - evaluation for orbital cellulitis. FINDINGS: ORBITS: The left globe demonstrates previous injection. Right lens replacement is present. Lenses are normally located. Symmetric caliber and signal of the optic nerves. Symmetric extraocular muscles. The superior ophthalmic veins are mildly dilated bilaterally, right greater than left. Cavernous is normal in size bilaterally. No significant periorbital edema or enhancement is present. No intraorbital inflammatory changes are present. No orbital mass. No abnormal enhancement. SINUSES AND MASTOIDS:  Clear. BRAIN: No acute abnormality. BONES AND SOFT TISSUES: Normal bone marrow signal. No acute soft tissue abnormality. IMPRESSION: 1. No evidence of orbital cellulitis. 2. Mildly dilated superior ophthalmic veins bilaterally, right greater than left. Electronically signed by: Lonni Necessary MD 12/29/2023 11:13 AM EDT RP Workstation: [id number]   CT CHEST WO CONTRAST Result Date: 12/29/2023 EXAM: CT CHEST WITHOUT CONTRAST 12/29/2023 02:12:47 AM TECHNIQUE: CT of the chest was performed without the administration of intravenous contrast. Multiplanar reformatted images are provided for review. Automated exposure control, iterative reconstruction, and/or weight based adjustment of the mA/kV was utilized to reduce the radiation dose to as low as reasonably achievable. COMPARISON: CTA head and neck 12/28/2023. CLINICAL HISTORY: Abnormal xray - lung nodule, >= 1 cm. Abnormal xray. FINDINGS: MEDIASTINUM: Heart and pericardium are unremarkable. The central airways are clear. LYMPH NODES: No mediastinal, hilar or axillary lymphadenopathy. LUNGS AND PLEURA: Respiratory motion obscures detail. 0.6 x 1.3 cm nodule along the right major fissure (series 4, image 55). According to the Fleischner Society pulmonary nodule recommendations, the finding is consistent with a solid nodule >8 mm and the recommendation is to consider CT at 3 months, PET-CT, or tissue sampling. There are a few additional small pulmonary nodules. For example, a 5 mm nodule in the right upper lobe on series 4, image 66 and a 5 mm nodule in the left lower lobe on series 4, image 77. No focal consolidation or pulmonary edema. No pleural effusion or pneumothorax. SOFT TISSUES/BONES: No acute abnormality of the bones or soft tissues. UPPER ABDOMEN: Limited images of the upper abdomen demonstrates no acute abnormality. IMPRESSION: 1. Solid pulmonary nodule along the right major fissure measuring 1.3 cm. Per Fleischner Society guidelines, consider CT at 3  months, PET-CT, or tissue sampling. Electronically signed by: Norman Gatlin MD 12/29/2023 02:24 AM EDT RP Workstation: [id number]   CT ANGIO HEAD NECK W WO CM (CODE STROKE) Result Date: 12/28/2023 CLINICAL DATA:  Initial evaluation for  acute neuro deficit, stroke. EXAM: CT ANGIOGRAPHY HEAD AND NECK WITH AND WITHOUT CONTRAST TECHNIQUE: Multidetector CT imaging of the head and neck was performed using the standard protocol during bolus administration of intravenous contrast. Multiplanar CT image reconstructions and MIPs were obtained to evaluate the vascular anatomy. Carotid stenosis measurements (when applicable) are obtained utilizing NASCET criteria, using the distal internal carotid diameter as the denominator. RADIATION DOSE REDUCTION: This exam was performed according to the departmental dose-optimization program which includes automated exposure control, adjustment of the mA and/or kV according to patient size and/or use of iterative reconstruction technique. CONTRAST:  75mL OMNIPAQUE  IOHEXOL  350 MG/ML SOLN COMPARISON:  CT from earlier the same day. FINDINGS: CTA NECK FINDINGS Aortic arch: Standard branching. Imaged portion shows no evidence of aneurysm or dissection. No significant stenosis of the major arch vessel origins. Right carotid system: No evidence of dissection, stenosis (50% or greater), or occlusion. Left carotid system: No evidence of dissection, stenosis (50% or greater), or occlusion. Vertebral arteries: No evidence of dissection, stenosis (50% or greater), or occlusion. Skeleton: No worrisome osseous lesions. Moderate to advanced spondylosis at C4-5 through C6-7. Degenerative changes noted about the TMJs bilaterally. Other neck: No other acute finding. Upper chest: 1.3 cm irregular nodular density present along the right major fissure (series 7, image 160), indeterminate. 1.3 cm pulmonary nodule within the right upper lobe. Review of the MIP images confirms the above findings CTA HEAD  FINDINGS Anterior circulation: Both internal carotid arteries widely patent to the termini without stenosis. A1 segments widely patent. Normal anterior communicating artery complex. Both anterior cerebral arteries widely patent to their distal aspects without stenosis. No M1 stenosis or occlusion. Normal MCA bifurcations. Distal MCA branches well perfused and symmetric. Posterior circulation: Both V4 segments patent without stenosis. Right vertebral artery strongly dominant. Both PICA patent. Basilar patent without stenosis. Superior cerebellar and posterior cerebral arteries widely patent bilaterally. Venous sinuses: Not well assessed due to timing the contrast bolus. Anatomic variants: Dominant left vertebral artery.  No aneurysm. Review of the MIP images confirms the above findings IMPRESSION: 1. Negative CTA of the head and neck. No large vessel occlusion or other emergent finding. No hemodynamically significant or correctable stenosis. 2. 1.3 cm irregular nodular density along the right major fissure, detected on incomplete chest CT. Per Fleischner Society guidelines, recommend prompt noncontrast chest CT for further evaluation. Electronically Signed   By: Morene Hoard M.D.   On: 12/28/2023 21:51   CT HEAD CODE STROKE WO CONTRAST Result Date: 12/28/2023 CLINICAL DATA:  Code stroke. Initial evaluation for acute neuro deficit, stroke suspected, left facial droop. EXAM: CT HEAD WITHOUT CONTRAST TECHNIQUE: Contiguous axial images were obtained from the base of the skull through the vertex without intravenous contrast. RADIATION DOSE REDUCTION: This exam was performed according to the departmental dose-optimization program which includes automated exposure control, adjustment of the mA and/or kV according to patient size and/or use of iterative reconstruction technique. COMPARISON:  CT from 01/09/2022 FINDINGS: Brain: Cerebral volume within normal limits. Patchy hypodensity involving the supratentorial  cerebral white matter, consistent chronic small vessel ischemic disease, moderate in nature. No acute intracranial hemorrhage. No acute large vessel territory infarct. No mass lesion or midline shift. No hydrocephalus or extra-axial fluid collection. Vascular: No abnormal hyperdense vessel. Scattered vascular calcifications noted within the carotid siphons. Skull: Scalp soft tissues within normal limits for calvarium intact. Sinuses/Orbits: Postoperative changes noted at the left lobe. Paranasal sinuses are largely clear. No mastoid effusion. Other: None. ASPECTS [facility] Stroke Program  Early CT Score) - Ganglionic level infarction (caudate, lentiform nuclei, internal capsule, insula, M1-M3 cortex): 7 - Supraganglionic infarction (M4-M6 cortex): 3 Total score (0-10 with 10 being normal): 10 IMPRESSION: 1. No acute intracranial abnormality. 2. ASPECTS is 10. 3. Moderate chronic microvascular ischemic disease. These results were communicated to Dr. Vanessa at 9:28 pm on 12/28/2023 by text page via the [facility] messaging system. Electronically Signed   By: Morene Hoard M.D.   On: 12/28/2023 21:29    Vitals:   12/29/23 0645 12/29/23 1117 12/29/23 1200 12/29/23 1300  BP: 134/77 (!) 137/102 (!) 130/57 (!) 148/77  Pulse: 92 91 85 87  Resp: 20 13 19 11   Temp: 98.3 F (36.8 C) 98.4 F (36.9 C)    TempSrc: Oral Oral    SpO2: 95% 100% 95% 99%  Weight:      Height:         PHYSICAL EXAM General: Chronically ill-appearing elderly patient in no acute distress Psych:  Mood and affect appropriate for situation CV: Regular rate and rhythm on monitor Respiratory:  Regular, unlabored respirations on room air   NEURO:  Mental Status: AA&Ox3 but unable to give history of current illness Speech/Language: speech is with very mild dysarthria but no aphasia.  Naming, repetition, fluency, and comprehension intact.  Cranial Nerves:  II: Right pupil round and reactive to light, left eye prosthetic III, IV,  VI: Able to track examiner with right eye V: Sensation is intact to light touch and symmetrical to face.  VII: Face is symmetrical resting and smiling VIII: hearing intact to voice. IX, X: Phonation is normal.  XII: tongue is midline without fasciculations. Motor: Able to move all 4 extremities with antigravity strength, but some tremor noted in right arm and drift noted in left arm Tone: is normal and bulk is normal Sensation- Intact to light touch bilaterally.  Coordination: FTN intact bilaterally Gait- deferred  Most Recent NIH  1a Level of Conscious.: 0 1b LOC Questions: 0 1c LOC Commands: 0 2 Best Gaze: 0 3 Visual: 0 4 Facial Palsy: 0 5a Motor Arm - left: 1 5b Motor Arm - Right: 0 6a Motor Leg - Left: 1 6b Motor Leg - Right: 1 7 Limb Ataxia: 0 8 Sensory: 0 9 Best Language: 0 10 Dysarthria: 1 11 Extinct. and Inatten.: 0 TOTAL: 4   ASSESSMENT/PLAN  Lindsey Baldwin is a 78 y.o. female with history of diabetes, sleep apnea, hypertension, osteoporosis, GERD, left prosthetic eye and dementia admitted for headache, vomiting, left-sided weakness, aphasia and dysarthria.  Patient's power of attorney stated that she was speaking with her yesterday at her facility and that she was unable to speak properly with both aphasia and dysarthria as well as right-sided weakness.  She also had been complaining of headache behind her right eye.  She was brought to the emergency department and found to have left-sided weakness, aphasia and dysarthria.  Symptoms did improve, and MRI was negative for stroke.  She was noted to have a mild UTI and has been treated appropriately for this.  NIH on Admission 12  TIA: Left-sided TIA with reported right-sided weakness, dysarthria and aphasia  Code Stroke CT head No acute abnormality. Small vessel disease. ASPECTS 10.    CTA head & neck no LVO or hemodynamically significant stenosis, 1.3 cm nodular density in right lung MRI no acute abnormality 2D  Echo EF greater than 75%, grade 1 diastolic dysfunction, normal left atrial size, no atrial level shunt LDL 46 HgbA1c  5.2 VTE prophylaxis -SCDs aspirin  81 mg daily prior to admission, now on aspirin  81 mg daily and clopidogrel 75 mg daily for 3 weeks and then Plavix alone. Therapy recommendations:  SNF Disposition: Return to SNF  Hypertension Home meds: Enalapril  10 mg twice daily Stable Maintain normotension  Hyperlipidemia Home meds: Atorvastatin  10 mg daily LDL 46, goal < 70 High intensity statin not indicated as LDL below goal Continue statin at discharge  Diabetes type II Controlled Home meds: Metformin  1000 mg twice daily, Januvia 100 mg daily HgbA1c 5.2, goal < 7.0 CBGs SSI Recommend close follow-up with PCP for better DM control  Dysphagia Patient has post-stroke dysphagia, SLP consulted    Diet   DIET DYS 2 Fluid consistency: Thin   Advance diet as tolerated  Other Stroke Risk Factors Obstructive sleep apnea   Other Active Problems UTI-antibiotics per primary team  Hospital day # 0  Patient seen by NP with MD, MD to edit note as needed. Temple Ewart E Everitt Clint Kill , MSN, AGACNP-BC [facility] See Amion for schedule and pager information 12/29/2023 2:29 PM    To contact Stroke Continuity provider, please refer to [email or web address]. After hours, contact General Neurology

## 2023-12-29 NOTE — ED Notes (Signed)
 MRI reports pt is agitated in MRI, requested MD to order something to help her relax.

## 2023-12-29 NOTE — Progress Notes (Signed)
 OT Cancellation Note  Patient Details Name: NENA HAMPE MRN: [id number] DOB: 03-24-1946   Cancelled Treatment:    Reason Eval/Treat Not Completed: Patient at procedure or test/ unavailable (MRI - OT evaluation to return when pt is back.)  Lucie JONETTA Kendall 12/29/2023, 10:33 AM

## 2023-12-29 NOTE — ED Notes (Signed)
 Legal guardian Lindsey Baldwin [phone] would like an update asap and the results of her MRI

## 2023-12-29 NOTE — ED Notes (Addendum)
 Phone and jewlery taken back to nursing home by visitor.

## 2023-12-29 NOTE — Care Management (Signed)
 Transition of Care [facility]) - Inpatient Brief Assessment   Patient Details  Name: Lindsey Baldwin MRN: [id number] Date of Birth: [birth year]  Transition of Care [facility] CM/SW Contact:    Corean JAYSON Canary, RN Phone Number: 12/29/2023, 2:05 PM   Clinical Narrative:  78 Year old resides at [facility], she is wheelchair bound. There and has a MPOA that is involved in her care.  She will go back to [place], Met with POA at bedside Ms. Maranda. She would like the patient to be at or about the same function with assistance from Therapies while in hospital She was active wheeling around [place] and enjoyed being in the company of others.    IP Cre management will continue to follow for further needs  Transition of Care Asessment: Insurance and Status: Insurance coverage has been reviewed Patient has primary care physician: Yes Home environment has been reviewed: Lives at [place] Prior level of function:: Needs assistance   Social Drivers of Health Review: SDOH reviewed no interventions necessary Readmission risk has been reviewed: Yes Transition of care needs: transition of care needs identified, TOC will continue to follow

## 2023-12-29 NOTE — Care Management Obs Status (Signed)
 MEDICARE OBSERVATION STATUS NOTIFICATION   Patient Details  Name: Lindsey Baldwin MRN: [id number] Date of Birth: 1945/04/15   Medicare Observation Status Notification Given:  Yes    Corean JAYSON Canary, RN 12/29/2023, 1:25 PM

## 2023-12-29 NOTE — ED Notes (Signed)
 Ptar called

## 2023-12-29 NOTE — ED Notes (Signed)
 Pt had 2 swallow evaluations. Patient passed the first and failed the second evaluation. SLP order placed due to patient not passing 2nd screening.

## 2023-12-29 NOTE — Discharge Summary (Signed)
 Physician Discharge Summary  Lindsey Baldwin FMW:[id number] DOB: 12/03/1945 DOA: 12/28/2023  PCP: Sun, Vyvyan, MD  Admit date: 12/28/2023 Discharge date: 12/29/2023    Admitted From: SNF Disposition: SNF  Recommendations for Outpatient Follow-up:  Follow up with PCP in 1-2 weeks Please obtain BMP/CBC in one week Please follow up with your PCP on the following pending results: Unresulted Labs (From admission, onward)     Start     Ordered   12/28/23 2234  Urine Culture  Add-on,   AD       Question:  Indication  Answer:  Dysuria   12/28/23 2233              Home Health: None Equipment/Devices: None  Discharge Condition: Stable CODE STATUS: DNR Diet recommendation:  Diet Order             DIET DYS 2 Fluid consistency: Thin  Diet effective now                 Due to brief hospitalization, have copied admitting hospitalist HPI and ED course.  HPI: Lindsey Baldwin is a 78 y.o. female with medical history significant of Alzheimer's type dementia, hypertension, hyperlipidemia, type 2 diabetes, OSA on CPAP, primary open-angle glaucoma of right eye, prosthetic left eye, GERD, mood disorder presents to the ED for evaluation of headache, vomiting, difficulty with speech, confusion, and left-sided weakness.  LKW at midnight on 12/28/2023.  Staff at her nursing facility apparently noted weakness in the morning but did not call EMS.  At baseline, patient is wheelchair dependent and needs assistance with all ADLs.  Patient vomited en route and was given IV Zofran  by EMS.  CBC unremarkable.  CMP notable for sodium 132, chloride 97, bicarb 19, anion gap 16, glucose 152, and total protein 6.1.  COVID/influenza/RSV PCR negative.  UDS negative.  Ethanol level <15.  UA with negative nitrite, trace leukocytes, and microscopy showing 11-20 WBCs and rare bacteria.  Urine culture pending.  Ammonia level pending.  A1c and lipid panel pending.  CT head showing no acute intracranial abnormality.  CTA  head and neck negative for LVO.  Neurology felt that she is not a candidate for TNKase due to being outside the window and also not a candidate for thrombectomy given significantly poor baseline and no evidence of LVO.  Recommended brain MRI and if positive for an acute infarct, then pursue full stroke workup.  Patient's legal guardian had reported to neurology that patient was having headache behind her right eye along with redness and blurriness of her vision.  Due to concern for possible acute angle-closure glaucoma, neurology had recommended checking intraocular pressure which was done by EDP and reportedly IOP was normal.  Instead, patient noted to have some erythema external to the right eye suggestive of possible preseptal cellulitis.  MRI of right eye orbit ordered to evaluate for potential orbital cellulitis or posterior infection.  Patient was given a dose of Rocephin  to cover for preseptal cellulitis.  She was also given Toradol  and Reglan  for headache.  [facility] called to admit.   Patient is not able to give much history due to confusion and expressive aphasia.  Complaining of headache and nausea.  History provided mostly by her friend/legal guardian Alfonso at bedside who states that patient stays at a nursing facility.  She was doing well until today when staff called her to inform her that patient was complaining of headaches and nausea.  When she spoke to the patient over  the phone she noticed that she was having difficulty with her speech so she asked the staff at her nursing facility to call EMS.  Patient had an episode of vomiting on the way to the hospital but also had a bowel movement on arrival.  Patient denies abdominal pain.  Denies shortness of breath or chest pain.  Patient's legal guardian states that patient recently had redness of her right eye for which she was prescribed Systane by her doctor as it was felt that this was due to dryness of the eye.  Patient denies right eye blurry  vision.  Subjective: Patient seen and examined earlier today, patient's overall symptoms have resolved.  She denied any nausea vomiting, abdominal pain, fever, chest pain, shortness of breath, any problem with urination or with bowel movements.  She was back to baseline.  Brief/Interim Summary: Patient presented with left-sided weakness and expressive aphasia.  Stroke ruled out with negative MRI and no LVO on the CTA.  Patient's acute encephalopathy has resolved, she is alert and oriented as well.  She has no more headache, nausea or vomiting.  She does have very minimal weakness of the left upper extremity which is chronic for her. She has mild and stable hyponatremia.  Of note, admitting hospitalist was concerned about possible periorbital cellulitis.  Neurology was concerned about orbital cellulitis.  MRI orbits ruled out orbital cellulitis.  On my examination, there are no signs of cellulitis around periorbital area and MRI did not show any inflammatory signs either.  No need for antibiotics either.LDL is 46, neurology recommended continuing statin.  Hemoglobin A1c 5.2.  UA was unremarkable, patient afebrile with no leukocytosis and no urinary complaints, do not suspect UTI either.  She was on aspirin  PTA, she has been started on Plavix and neurology recommended continuing DAPT for 3 weeks followed by Plavix alone.  They have cleared her for discharge, she is being discharged to SNF.  Solid pulmonary nodule along the right major fissure measuring 1.3 cm. Per Fleischner Society guidelines, consider CT at 3 months, PET-CT, or tissue sampling.  Defer to PCP.  Discharge plan was discussed with patient and/or family member and they verbalized understanding and agreed with it.  Discharge Diagnoses:  Principal Problem:   Left-sided weakness Active Problems:   Glaucoma   Acute encephalopathy   Headache   Nausea and vomiting   TIA (transient ischemic attack)    Discharge  Instructions   Allergies as of 12/29/2023       Reactions   Apraclonidine  Hcl Other (See Comments)   Unknown reaction Pt does not recall **not listed on the MAR**   Penicillins Swelling, Other (See Comments)   Has patient had a PCN reaction causing immediate rash, facial/tongue/throat swelling, SOB or lightheadedness with hypotension: YES Has patient had a PCN reaction causing severe rash involving mucus membranes or skin necrosis: NO Has patient had a PCN reaction that required hospitalization: NO Has patient had a PCN reaction occurring within the last 10 years: NO If all of the above answers are NO, then may proceed with Cephalosporin use.   Sulfa Antibiotics Rash        Medication List     TAKE these medications    acetaminophen  500 MG tablet Commonly known as: TYLENOL  Take 1,000 mg by mouth in the morning, at noon, and at bedtime.   alendronate 70 MG tablet Commonly known as: FOSAMAX Take 70 mg by mouth every Wednesday.   alum & mag hydroxide-simeth 400-400-40 MG/5ML suspension Commonly  known as: MAALOX PLUS Take 15 mLs by mouth every 8 (eight) hours as needed for indigestion.   aspirin  EC 81 MG tablet Take 1 tablet (81 mg total) by mouth daily.   atorvastatin  10 MG tablet Commonly known as: LIPITOR Take 10 mg by mouth at bedtime.   Biofreeze 10 % Crea Generic drug: Menthol  (Topical Analgesic) Apply 1 Application topically in the morning and at bedtime. Apply to wrist and any other affected areas   calcium  carbonate 500 MG chewable tablet Commonly known as: TUMS - dosed in mg elemental calcium  Chew 1,000 mg by mouth 2 (two) times daily.   clopidogrel 75 MG tablet Commonly known as: PLAVIX Take 1 tablet (75 mg total) by mouth daily.   cyanocobalamin  1000 MCG tablet Commonly known as: VITAMIN B12 Take 1 tablet (1,000 mcg total) by mouth daily.   DULoxetine 60 MG capsule Commonly known as: CYMBALTA Take 60 mg by mouth daily.   enalapril  10 MG  tablet Commonly known as: VASOTEC  Take 10 mg by mouth 2 (two) times daily.   feeding supplement (GLUCERNA SHAKE) Liqd Take 237 mLs by mouth 2 (two) times daily between meals.   fluticasone 50 MCG/ACT nasal spray Commonly known as: FLONASE Place 1 spray into both nostrils daily.   furosemide  20 MG tablet Commonly known as: LASIX  Take 20 mg by mouth every other day.   latanoprost  0.005 % ophthalmic solution Commonly known as: XALATAN  Place 1 drop into the right eye at bedtime.   Lidocaine  HCl 4 % Ptch Apply 2 patches topically daily. Apply to lower back   loratadine 10 MG tablet Commonly known as: CLARITIN Take 10 mg by mouth daily.   metFORMIN  500 MG 24 hr tablet Commonly known as: GLUCOPHAGE -XR Take 1,000 mg by mouth 2 (two) times daily with a meal.   metoprolol  tartrate 25 MG tablet Commonly known as: LOPRESSOR  Take 0.5 tablets (12.5 mg total) by mouth 2 (two) times daily.   multivitamin with minerals tablet Take 1 tablet by mouth daily.   pantoprazole 40 MG tablet Commonly known as: PROTONIX Take 40 mg by mouth daily.   pilocarpine  1 % ophthalmic solution Commonly known as: PILOCAR Place 1 drop into the right eye 4 (four) times daily.   rivastigmine  4.5 MG capsule Commonly known as: EXELON  Take 1 capsule (4.5 mg total) by mouth 2 (two) times daily.   sitaGLIPtin 100 MG tablet Commonly known as: JANUVIA Take 100 mg by mouth daily.   solifenacin 10 MG tablet Commonly known as: VESICARE Take 10 mg by mouth daily.   tamsulosin  0.4 MG Caps capsule Commonly known as: FLOMAX  TAKE 1 CAPSULE BY MOUTH EVERY DAY   timolol  0.5 % ophthalmic solution Commonly known as: TIMOPTIC  Place 1 drop into the right eye 2 (two) times daily.   Vitamin D3 Maximum Strength 125 MCG (5000 UT) capsule Generic drug: Cholecalciferol Take 5,000 Units by mouth daily.        Follow-up Information     Sun, Vyvyan, MD Follow up in 1 week(s).   Specialty: Family  Medicine Contact information: [phone] [address] [place] [address] [phone]                Allergies  Allergen Reactions   Apraclonidine  Hcl Other (See Comments)    Unknown reaction Pt does not recall **not listed on the MAR**   Penicillins Swelling and Other (See Comments)    Has patient had a PCN reaction causing immediate rash, facial/tongue/throat swelling, SOB  or lightheadedness with hypotension: YES Has patient had a PCN reaction causing severe rash involving mucus membranes or skin necrosis: NO Has patient had a PCN reaction that required hospitalization: NO Has patient had a PCN reaction occurring within the last 10 years: NO If all of the above answers are NO, then may proceed with Cephalosporin use.    Sulfa Antibiotics Rash    Consultations: Neurology   Procedures/Studies: ECHOCARDIOGRAM COMPLETE Result Date: 12/29/2023    ECHOCARDIOGRAM REPORT   Patient Name:   JILLIANE KAZANJIAN Date of Exam: 12/29/2023 Medical Rec #:  [id number]       Height:       63.0 in Accession #:    [id number]      Weight:       146.4 lb Date of Birth:  Mar 26, 1946       BSA:          1.693 m Patient Age:    78 years        BP:           134/77 mmHg Patient Gender: F               HR:           92 bpm. Exam Location:  Inpatient Procedure: 2D Echo, Cardiac Doppler, Color Doppler and Strain Analysis (Both            Spectral and Color Flow Doppler were utilized during procedure). Indications:    Stroke l63.9  History:        Patient has no prior history of Echocardiogram examinations.                 Risk Factors:Hypertension, Diabetes and Dyslipidemia. Hx of                 COVID-19 and OSA on CPAP.  Sonographer:    Aida Pizza RCS Referring Phys: [id number] [facility] XU  Sonographer Comments: Global longitudinal strain was attempted. IMPRESSIONS  1. Left ventricular ejection fraction, by estimation, is >75%. The left ventricle has hyperdynamic function. The left ventricle has no  regional wall motion abnormalities. Left ventricular diastolic parameters are consistent with Grade I diastolic dysfunction (impaired relaxation).  2. Right ventricular systolic function is normal. The right ventricular size is normal.  3. The mitral valve is normal in structure. No evidence of mitral valve regurgitation. No evidence of mitral stenosis.  4. The aortic valve is tricuspid. Aortic valve regurgitation is trivial. Aortic valve sclerosis is present, with no evidence of aortic valve stenosis.  5. The inferior vena cava is normal in size with greater than 50% respiratory variability, suggesting right atrial pressure of 3 mmHg. Comparison(s): No prior Echocardiogram. FINDINGS  Left Ventricle: Left ventricular ejection fraction, by estimation, is >75%. The left ventricle has hyperdynamic function. The left ventricle has no regional wall motion abnormalities. Strain was performed and the global longitudinal strain is indeterminate. The left ventricular internal cavity size was normal in size. There is no left ventricular hypertrophy. Left ventricular diastolic parameters are consistent with Grade I diastolic dysfunction (impaired relaxation). Right Ventricle: The right ventricular size is normal. Right ventricular systolic function is normal. Left Atrium: Left atrial size was normal in size. Right Atrium: Right atrial size was normal in size. Pericardium: There is no evidence of pericardial effusion. Presence of epicardial fat layer. Mitral Valve: The mitral valve is normal in structure. Mild mitral annular calcification. No evidence of mitral valve regurgitation. No evidence of mitral valve stenosis. Tricuspid  Valve: The tricuspid valve is normal in structure. Tricuspid valve regurgitation is trivial. No evidence of tricuspid stenosis. Aortic Valve: The aortic valve is tricuspid. Aortic valve regurgitation is trivial. Aortic valve sclerosis is present, with no evidence of aortic valve stenosis. Pulmonic  Valve: The pulmonic valve was normal in structure. Pulmonic valve regurgitation is trivial. No evidence of pulmonic stenosis. Aorta: The aortic root is normal in size and structure. Venous: The inferior vena cava is normal in size with greater than 50% respiratory variability, suggesting right atrial pressure of 3 mmHg. IAS/Shunts: No atrial level shunt detected by color flow Doppler.  LEFT VENTRICLE PLAX 2D LVIDd:         3.80 cm   Diastology LVIDs:         3.00 cm   LV e' medial:    7.94 cm/s LV PW:         0.90 cm   LV E/e' medial:  10.7 LV IVS:        0.90 cm   LV e' lateral:   9.79 cm/s LVOT diam:     1.80 cm   LV E/e' lateral: 8.6 LV SV:         67 LV SV Index:   39 LVOT Area:     2.54 cm  RIGHT VENTRICLE RV S prime:     12.40 cm/s TAPSE (M-mode): 2.0 cm LEFT ATRIUM             Index        RIGHT ATRIUM           Index LA diam:        2.60 cm 1.54 cm/m   RA Area:     13.50 cm LA Vol (A2C):   41.3 ml 24.39 ml/m  RA Volume:   30.30 ml  17.89 ml/m LA Vol (A4C):   68.5 ml 40.45 ml/m LA Biplane Vol: 54.2 ml 32.01 ml/m  AORTIC VALVE LVOT Vmax:   125.00 cm/s LVOT Vmean:  85.800 cm/s LVOT VTI:    0.262 m  AORTA Ao Root diam: 3.30 cm MITRAL VALVE MV Area (PHT): 3.42 cm    SHUNTS MV Decel Time: 222 msec    Systemic VTI:  0.26 m MV E velocity: 84.60 cm/s  Systemic Diam: 1.80 cm MV A velocity: 96.20 cm/s MV E/A ratio:  0.88 Redell Shallow MD Electronically signed by Redell Shallow MD Signature Date/Time: 12/29/2023/1:38:41 PM    Final    MR BRAIN WO CONTRAST Result Date: 12/29/2023 EXAM: MRI BRAIN WITHOUT CONTRAST 12/29/2023 11:36:01 AM TECHNIQUE: Multiplanar multisequence MRI of the head/brain was performed without the administration of intravenous contrast. COMPARISON: None available. CLINICAL HISTORY: Neuro deficit, acute, stroke suspected. FINDINGS: BRAIN AND VENTRICLES: No acute infarct. No intracranial hemorrhage. No mass. No midline shift. No hydrocephalus. Periventricular and scattered subcortical T2  hyperintensities are mildly advanced for age. Those treatment changes of the left lobe are noted. The sella is unremarkable. Normal flow voids. Cavernous sinus is normal. No other signs of increased intracranial pressures are present. ORBITS: A weighted lid is present. Right lens replacement is present. Mild prominence of the superior ophthalmic vein is present bilaterally. SINUSES AND MASTOIDS: A small left mastoid effusion is present. No obstructing nasopharyngeal lesion is present. BONES AND SOFT TISSUES: Normal marrow signal. No acute soft tissue abnormality. IMPRESSION: 1. No acute infarct or intracranial hemorrhage. 2. Mildly advanced periventricular and scattered subcortical T2 hyperintensities for age. 3. Small left mastoid effusion. Electronically signed by: Lonni Necessary MD  12/29/2023 12:00 PM EDT RP Workstation: [id number]   MR ORBITS W WO CONTRAST Result Date: 12/29/2023 EXAM: MRI ORBITS WITHOUT AND WITH CONTRAST 12/29/2023 10:59:08 AM TECHNIQUE: Multiplanar, multisequence MRI of the orbits was performed without and with intravenous contrast. 6.6 mL gadobutrol (GADAVIST) 1 MMOL/ML injection. 6.6 mL GADOBUTROL 1 MMOL/ML IV SOLN. COMPARISON: None available CLINICAL HISTORY: Periorbital cellulitis; pt has right preseptal erythema - evaluation for orbital cellulitis. FINDINGS: ORBITS: The left globe demonstrates previous injection. Right lens replacement is present. Lenses are normally located. Symmetric caliber and signal of the optic nerves. Symmetric extraocular muscles. The superior ophthalmic veins are mildly dilated bilaterally, right greater than left. Cavernous is normal in size bilaterally. No significant periorbital edema or enhancement is present. No intraorbital inflammatory changes are present. No orbital mass. No abnormal enhancement. SINUSES AND MASTOIDS: Clear. BRAIN: No acute abnormality. BONES AND SOFT TISSUES: Normal bone marrow signal. No acute soft tissue abnormality.  IMPRESSION: 1. No evidence of orbital cellulitis. 2. Mildly dilated superior ophthalmic veins bilaterally, right greater than left. Electronically signed by: Lonni Necessary MD 12/29/2023 11:13 AM EDT RP Workstation: [id number]   CT CHEST WO CONTRAST Result Date: 12/29/2023 EXAM: CT CHEST WITHOUT CONTRAST 12/29/2023 02:12:47 AM TECHNIQUE: CT of the chest was performed without the administration of intravenous contrast. Multiplanar reformatted images are provided for review. Automated exposure control, iterative reconstruction, and/or weight based adjustment of the mA/kV was utilized to reduce the radiation dose to as low as reasonably achievable. COMPARISON: CTA head and neck 12/28/2023. CLINICAL HISTORY: Abnormal xray - lung nodule, >= 1 cm. Abnormal xray. FINDINGS: MEDIASTINUM: Heart and pericardium are unremarkable. The central airways are clear. LYMPH NODES: No mediastinal, hilar or axillary lymphadenopathy. LUNGS AND PLEURA: Respiratory motion obscures detail. 0.6 x 1.3 cm nodule along the right major fissure (series 4, image 55). According to the Fleischner Society pulmonary nodule recommendations, the finding is consistent with a solid nodule >8 mm and the recommendation is to consider CT at 3 months, PET-CT, or tissue sampling. There are a few additional small pulmonary nodules. For example, a 5 mm nodule in the right upper lobe on series 4, image 66 and a 5 mm nodule in the left lower lobe on series 4, image 77. No focal consolidation or pulmonary edema. No pleural effusion or pneumothorax. SOFT TISSUES/BONES: No acute abnormality of the bones or soft tissues. UPPER ABDOMEN: Limited images of the upper abdomen demonstrates no acute abnormality. IMPRESSION: 1. Solid pulmonary nodule along the right major fissure measuring 1.3 cm. Per Fleischner Society guidelines, consider CT at 3 months, PET-CT, or tissue sampling. Electronically signed by: Norman Gatlin MD 12/29/2023 02:24 AM EDT RP Workstation:  [id number]   CT ANGIO HEAD NECK W WO CM (CODE STROKE) Result Date: 12/28/2023 CLINICAL DATA:  Initial evaluation for acute neuro deficit, stroke. EXAM: CT ANGIOGRAPHY HEAD AND NECK WITH AND WITHOUT CONTRAST TECHNIQUE: Multidetector CT imaging of the head and neck was performed using the standard protocol during bolus administration of intravenous contrast. Multiplanar CT image reconstructions and MIPs were obtained to evaluate the vascular anatomy. Carotid stenosis measurements (when applicable) are obtained utilizing NASCET criteria, using the distal internal carotid diameter as the denominator. RADIATION DOSE REDUCTION: This exam was performed according to the departmental dose-optimization program which includes automated exposure control, adjustment of the mA and/or kV according to patient size and/or use of iterative reconstruction technique. CONTRAST:  75mL OMNIPAQUE  IOHEXOL  350 MG/ML SOLN COMPARISON:  CT from earlier the same day. FINDINGS: CTA NECK FINDINGS Aortic arch:  Standard branching. Imaged portion shows no evidence of aneurysm or dissection. No significant stenosis of the major arch vessel origins. Right carotid system: No evidence of dissection, stenosis (50% or greater), or occlusion. Left carotid system: No evidence of dissection, stenosis (50% or greater), or occlusion. Vertebral arteries: No evidence of dissection, stenosis (50% or greater), or occlusion. Skeleton: No worrisome osseous lesions. Moderate to advanced spondylosis at C4-5 through C6-7. Degenerative changes noted about the TMJs bilaterally. Other neck: No other acute finding. Upper chest: 1.3 cm irregular nodular density present along the right major fissure (series 7, image 160), indeterminate. 1.3 cm pulmonary nodule within the right upper lobe. Review of the MIP images confirms the above findings CTA HEAD FINDINGS Anterior circulation: Both internal carotid arteries widely patent to the termini without stenosis. A1 segments  widely patent. Normal anterior communicating artery complex. Both anterior cerebral arteries widely patent to their distal aspects without stenosis. No M1 stenosis or occlusion. Normal MCA bifurcations. Distal MCA branches well perfused and symmetric. Posterior circulation: Both V4 segments patent without stenosis. Right vertebral artery strongly dominant. Both PICA patent. Basilar patent without stenosis. Superior cerebellar and posterior cerebral arteries widely patent bilaterally. Venous sinuses: Not well assessed due to timing the contrast bolus. Anatomic variants: Dominant left vertebral artery.  No aneurysm. Review of the MIP images confirms the above findings IMPRESSION: 1. Negative CTA of the head and neck. No large vessel occlusion or other emergent finding. No hemodynamically significant or correctable stenosis. 2. 1.3 cm irregular nodular density along the right major fissure, detected on incomplete chest CT. Per Fleischner Society guidelines, recommend prompt noncontrast chest CT for further evaluation. Electronically Signed   By: Morene Hoard M.D.   On: 12/28/2023 21:51   CT HEAD CODE STROKE WO CONTRAST Result Date: 12/28/2023 CLINICAL DATA:  Code stroke. Initial evaluation for acute neuro deficit, stroke suspected, left facial droop. EXAM: CT HEAD WITHOUT CONTRAST TECHNIQUE: Contiguous axial images were obtained from the base of the skull through the vertex without intravenous contrast. RADIATION DOSE REDUCTION: This exam was performed according to the departmental dose-optimization program which includes automated exposure control, adjustment of the mA and/or kV according to patient size and/or use of iterative reconstruction technique. COMPARISON:  CT from 01/09/2022 FINDINGS: Brain: Cerebral volume within normal limits. Patchy hypodensity involving the supratentorial cerebral white matter, consistent chronic small vessel ischemic disease, moderate in nature. No acute intracranial  hemorrhage. No acute large vessel territory infarct. No mass lesion or midline shift. No hydrocephalus or extra-axial fluid collection. Vascular: No abnormal hyperdense vessel. Scattered vascular calcifications noted within the carotid siphons. Skull: Scalp soft tissues within normal limits for calvarium intact. Sinuses/Orbits: Postoperative changes noted at the left lobe. Paranasal sinuses are largely clear. No mastoid effusion. Other: None. ASPECTS [facility] Stroke Program Early CT Score) - Ganglionic level infarction (caudate, lentiform nuclei, internal capsule, insula, M1-M3 cortex): 7 - Supraganglionic infarction (M4-M6 cortex): 3 Total score (0-10 with 10 being normal): 10 IMPRESSION: 1. No acute intracranial abnormality. 2. ASPECTS is 10. 3. Moderate chronic microvascular ischemic disease. These results were communicated to Dr. Vanessa at 9:28 pm on 12/28/2023 by text page via the [facility] messaging system. Electronically Signed   By: Morene Hoard M.D.   On: 12/28/2023 21:29     Discharge Exam: Vitals:   12/29/23 1200 12/29/23 1300  BP: (!) 130/57 (!) 148/77  Pulse: 85 87  Resp: 19 11  Temp:    SpO2: 95% 99%   Vitals:   12/29/23 0645 12/29/23  1117 12/29/23 1200 12/29/23 1300  BP: 134/77 (!) 137/102 (!) 130/57 (!) 148/77  Pulse: 92 91 85 87  Resp: 20 13 19 11   Temp: 98.3 F (36.8 C) 98.4 F (36.9 C)    TempSrc: Oral Oral    SpO2: 95% 100% 95% 99%  Weight:      Height:        General: Pt is alert, awake, not in acute distress Cardiovascular: RRR, S1/S2 +, no rubs, no gallops Respiratory: CTA bilaterally, no wheezing, no rhonchi Abdominal: Soft, NT, ND, bowel sounds + Extremities: no edema, no cyanosis    The results of significant diagnostics from this hospitalization (including imaging, microbiology, ancillary and laboratory) are listed below for reference.     Microbiology: Recent Results (from the past 240 hours)  Resp panel by RT-PCR (RSV, Flu A&B, Covid)  Anterior Nasal Swab     Status: None   Collection Time: 12/28/23  9:34 PM   Specimen: Anterior Nasal Swab  Result Value Ref Range Status   SARS Coronavirus 2 by RT PCR NEGATIVE NEGATIVE Final   Influenza A by PCR NEGATIVE NEGATIVE Final   Influenza B by PCR NEGATIVE NEGATIVE Final    Comment: (NOTE) The Xpert Xpress SARS-CoV-2/FLU/RSV plus assay is intended as an aid in the diagnosis of influenza from Nasopharyngeal swab specimens and should not be used as a sole basis for treatment. Nasal washings and aspirates are unacceptable for Xpert Xpress SARS-CoV-2/FLU/RSV testing.  Fact Sheet for Patients: BloggerCourse.com  Fact Sheet for Healthcare Providers: SeriousBroker.it  This test is not yet approved or cleared by the [place]  FDA and has been authorized for detection and/or diagnosis of SARS-CoV-2 by FDA under an Emergency Use Authorization (EUA). This EUA will remain in effect (meaning this test can be used) for the duration of the COVID-19 declaration under Section 564(b)(1) of the Act, 21 U.S.C. section 360bbb-3(b)(1), unless the authorization is terminated or revoked.     Resp Syncytial Virus by PCR NEGATIVE NEGATIVE Final    Comment: (NOTE) Fact Sheet for Patients: BloggerCourse.com  Fact Sheet for Healthcare Providers: SeriousBroker.it  This test is not yet approved or cleared by the [place]  FDA and has been authorized for detection and/or diagnosis of SARS-CoV-2 by FDA under an Emergency Use Authorization (EUA). This EUA will remain in effect (meaning this test can be used) for the duration of the COVID-19 declaration under Section 564(b)(1) of the Act, 21 U.S.C. section 360bbb-3(b)(1), unless the authorization is terminated or revoked.  Performed at [facility], [address] [place], [place] [address]      Labs: BNP (last 3 results) No  results for input(s): BNP in the last 8760 hours. Basic Metabolic Panel: Recent Labs  Lab 12/28/23 2112 12/28/23 2119 12/29/23 0424  NA 132* 134* 132*  K 3.7 3.9 4.0  CL 97* 99 98  CO2 19*  --  20*  GLUCOSE 152* 148* 126*  BUN 10 10 9   CREATININE 0.67 0.60 0.72  CALCIUM  9.0  --  8.8*   Liver Function Tests: Recent Labs  Lab 12/28/23 2112  AST 24  ALT 15  ALKPHOS 54  BILITOT 0.8  PROT 6.1*  ALBUMIN  3.5   Recent Labs  Lab 12/29/23 0424  LIPASE 14   Recent Labs  Lab 12/29/23 0858  AMMONIA <13   CBC: Recent Labs  Lab 12/28/23 2112 12/28/23 2119  WBC 7.2  --   NEUTROABS 4.6  --   HGB 12.9 12.9  HCT  38.1 38.0  MCV 91.4  --   PLT 153  --    Cardiac Enzymes: No results for input(s): CKTOTAL, CKMB, CKMBINDEX, TROPONINI in the last 168 hours. BNP: Invalid input(s): POCBNP CBG: Recent Labs  Lab 12/28/23 2112 12/29/23 0408 12/29/23 0854 12/29/23 1144  GLUCAP 141* 130* 98 100*   D-Dimer No results for input(s): DDIMER in the last 72 hours. Hgb A1c Recent Labs    12/29/23 0424  HGBA1C 5.2   Lipid Profile Recent Labs    12/29/23 0424  CHOL 94  HDL 33*  LDLCALC 46  TRIG 77  CHOLHDL 2.8   Thyroid  function studies No results for input(s): TSH, T4TOTAL, T3FREE, THYROIDAB in the last 72 hours.  Invalid input(s): FREET3 Anemia work up No results for input(s): VITAMINB12, FOLATE, FERRITIN, TIBC, IRON, RETICCTPCT in the last 72 hours. Urinalysis    Component Value Date/Time   COLORURINE STRAW (A) 12/28/2023 2152   APPEARANCEUR CLEAR 12/28/2023 2152   LABSPEC 1.028 12/28/2023 2152   PHURINE 7.0 12/28/2023 2152   GLUCOSEU 50 (A) 12/28/2023 2152   HGBUR NEGATIVE 12/28/2023 2152   BILIRUBINUR NEGATIVE 12/28/2023 2152   KETONESUR 20 (A) 12/28/2023 2152   PROTEINUR NEGATIVE 12/28/2023 2152   NITRITE NEGATIVE 12/28/2023 2152   LEUKOCYTESUR TRACE (A) 12/28/2023 2152   Sepsis Labs Recent Labs  Lab  12/28/23 2112  WBC 7.2   Microbiology Recent Results (from the past 240 hours)  Resp panel by RT-PCR (RSV, Flu A&B, Covid) Anterior Nasal Swab     Status: None   Collection Time: 12/28/23  9:34 PM   Specimen: Anterior Nasal Swab  Result Value Ref Range Status   SARS Coronavirus 2 by RT PCR NEGATIVE NEGATIVE Final   Influenza A by PCR NEGATIVE NEGATIVE Final   Influenza B by PCR NEGATIVE NEGATIVE Final    Comment: (NOTE) The Xpert Xpress SARS-CoV-2/FLU/RSV plus assay is intended as an aid in the diagnosis of influenza from Nasopharyngeal swab specimens and should not be used as a sole basis for treatment. Nasal washings and aspirates are unacceptable for Xpert Xpress SARS-CoV-2/FLU/RSV testing.  Fact Sheet for Patients: BloggerCourse.com  Fact Sheet for Healthcare Providers: SeriousBroker.it  This test is not yet approved or cleared by the [place]  FDA and has been authorized for detection and/or diagnosis of SARS-CoV-2 by FDA under an Emergency Use Authorization (EUA). This EUA will remain in effect (meaning this test can be used) for the duration of the COVID-19 declaration under Section 564(b)(1) of the Act, 21 U.S.C. section 360bbb-3(b)(1), unless the authorization is terminated or revoked.     Resp Syncytial Virus by PCR NEGATIVE NEGATIVE Final    Comment: (NOTE) Fact Sheet for Patients: BloggerCourse.com  Fact Sheet for Healthcare Providers: SeriousBroker.it  This test is not yet approved or cleared by the [place]  FDA and has been authorized for detection and/or diagnosis of SARS-CoV-2 by FDA under an Emergency Use Authorization (EUA). This EUA will remain in effect (meaning this test can be used) for the duration of the COVID-19 declaration under Section 564(b)(1) of the Act, 21 U.S.C. section 360bbb-3(b)(1), unless the authorization is terminated  or revoked.  Performed at [facility], [address], [place], [place] [address]     FURTHER DISCHARGE INSTRUCTIONS:   Get Medicines reviewed and adjusted: Please take all your medications with you for your next visit with your Primary MD   Laboratory/radiological data: Please request your Primary MD to go over all hospital tests and procedure/radiological results  at the follow up, please ask your Primary MD to get all Hospital records sent to his/her office.   In some cases, they will be blood work, cultures and biopsy results pending at the time of your discharge. Please request that your primary care M.D. goes through all the records of your hospital data and follows up on these results.   Also Note the following: If you experience worsening of your admission symptoms, develop shortness of breath, life threatening emergency, suicidal or homicidal thoughts you must seek medical attention immediately by calling 911 or calling your MD immediately  if symptoms less severe.   You must read complete instructions/literature along with all the possible adverse reactions/side effects for all the Medicines you take and that have been prescribed to you. Take any new Medicines after you have completely understood and accpet all the possible adverse reactions/side effects.    patient was instructed, not to drive, operate heavy machinery, perform activities at heights, swimming or participation in water  activities or provide baby-sitting services while on Pain, Sleep and Anxiety Medications; until their outpatient Physician has advised to do so again. Also recommended to not to take more than prescribed Pain, Sleep and Anxiety Medications.  It is not advisable to combine anxiety, sleep and pain medications without talking with your primary care provider.     Wear Seat belts while driving.   Please note: You were cared for by a hospitalist during your hospital stay. Once you are discharged,  your primary care physician will handle any further medical issues. Please note that NO REFILLS for any discharge medications will be authorized once you are discharged, as it is imperative that you return to your primary care physician (or establish a relationship with a primary care physician if you do not have one) for your post hospital discharge needs so that they can reassess your need for medications and monitor your lab values  Time coordinating discharge: Over 30 minutes  SIGNED:   Fredia Skeeter, MD  [facility] 12/29/2023, 2:35 PM *Please note that this is a verbal dictation therefore any spelling or grammatical errors are due to the Dragon Medical One system interpretation. If 7PM-7AM, please contact night-coverage [email or web address]

## 2023-12-29 NOTE — ED Notes (Signed)
 Pt transported to MRI

## 2023-12-30 DIAGNOSIS — I110 Hypertensive heart disease with heart failure: Secondary | ICD-10-CM | POA: Diagnosis not present

## 2023-12-30 DIAGNOSIS — M6281 Muscle weakness (generalized): Secondary | ICD-10-CM | POA: Diagnosis not present

## 2023-12-30 DIAGNOSIS — N3281 Overactive bladder: Secondary | ICD-10-CM | POA: Diagnosis not present

## 2023-12-30 DIAGNOSIS — N390 Urinary tract infection, site not specified: Secondary | ICD-10-CM | POA: Diagnosis not present

## 2023-12-30 DIAGNOSIS — H401190 Primary open-angle glaucoma, unspecified eye, stage unspecified: Secondary | ICD-10-CM | POA: Diagnosis not present

## 2023-12-30 DIAGNOSIS — Z7984 Long term (current) use of oral hypoglycemic drugs: Secondary | ICD-10-CM | POA: Diagnosis not present

## 2023-12-30 DIAGNOSIS — I509 Heart failure, unspecified: Secondary | ICD-10-CM | POA: Diagnosis not present

## 2023-12-30 DIAGNOSIS — G459 Transient cerebral ischemic attack, unspecified: Secondary | ICD-10-CM | POA: Diagnosis not present

## 2023-12-30 DIAGNOSIS — R2681 Unsteadiness on feet: Secondary | ICD-10-CM | POA: Diagnosis not present

## 2023-12-30 DIAGNOSIS — N183 Chronic kidney disease, stage 3 unspecified: Secondary | ICD-10-CM | POA: Diagnosis not present

## 2023-12-30 DIAGNOSIS — E1122 Type 2 diabetes mellitus with diabetic chronic kidney disease: Secondary | ICD-10-CM | POA: Diagnosis not present

## 2023-12-31 DIAGNOSIS — R2681 Unsteadiness on feet: Secondary | ICD-10-CM | POA: Diagnosis not present

## 2023-12-31 DIAGNOSIS — M6281 Muscle weakness (generalized): Secondary | ICD-10-CM | POA: Diagnosis not present

## 2024-01-01 LAB — URINE CULTURE: Culture: >=100000 — AB

## 2024-02-11 ENCOUNTER — Telehealth: Payer: Self-pay | Admitting: Neurology

## 2024-02-11 ENCOUNTER — Ambulatory Visit: Payer: 59 | Admitting: Neurology

## 2024-02-11 ENCOUNTER — Encounter: Payer: Self-pay | Admitting: Neurology

## 2024-02-11 VITALS — BP 130/82 | HR 75

## 2024-02-11 DIAGNOSIS — G301 Alzheimer's disease with late onset: Secondary | ICD-10-CM

## 2024-02-11 DIAGNOSIS — F02A0 Dementia in other diseases classified elsewhere, mild, without behavioral disturbance, psychotic disturbance, mood disturbance, and anxiety: Secondary | ICD-10-CM | POA: Diagnosis not present

## 2024-02-11 MED ORDER — MEMANTINE HCL 10 MG PO TABS: 10.0000 mg | ORAL_TABLET | Freq: Two times a day (BID) | ORAL | 3 refills | Status: AC

## 2024-02-11 MED ORDER — RIVASTIGMINE TARTRATE 4.5 MG PO CAPS: 4.5000 mg | ORAL_CAPSULE | Freq: Two times a day (BID) | ORAL | 0 refills | Status: AC

## 2024-02-11 NOTE — Telephone Encounter (Signed)
 Appointment details confirmed

## 2024-02-11 NOTE — Progress Notes (Signed)
 [facility]  PATIENT: Lindsey Baldwin DOB: 1945-05-04  REQUESTING CLINICIAN: Sun, Vyvyan, MD HISTORY FROM: Patient and friend Alfonso REASON FOR VISIT: Dementia follow up   HISTORICAL  CHIEF COMPLAINT:  Chief Complaint  Patient presents with   Follow-up    Pt in room 12. Alyse Alfonso in room. Here for Alzheimer follow up. MOCA:11   INTERVAL HISTORY 02/11/2024 Patient presents for follow-up, his she is accompanied by her friend).  Last visit was a year ago since then, she is has been stable, she was recently admitted to the hospital in September for confusion.  Her workup at that time was negative for any acute stroke but her urinalysis showed bacteria and leukocyte.  Urine culture showed E. Coli.  They tell me that they were not sent home with antibiotic.  She is still resident at the [facility]   INTERVAL HISTORY 02/11/2023:  Patient presents today for follow-up, she is accompanied by her legal guardian Alfonso.  Last visit was a year ago since then she has been stable.  She does live at an assisted living facility, on Exelon  4.5 mg twice daily.  They reported her memory is stable, no new concerns.  At 1 point in September she was off the Exelon  but the facility put her back.    INTERVAL HISTORY 02/05/2022:  Patient presents today for follow-up, she is accompanied by a friend Alfonso.  Since last visit she continues to improve.  Currently she is a resident at [facility] and is doing well.  She likes the facility and said that she is getting physical therapy, she is eating well, she is sleeping well and currently does not have any complaints.  Alfonso is now taking care of her finances.   HISTORY OF PRESENT ILLNESS:  This is a 78 year old woman with medical history including hypertension, hyperlipidemia, diabetes mellitus, overactive bladder who is presenting with her longtime friend Alfonso for memory deficit.  Per Alfonso who is now at the power of  attorney, she reports patient has been suffering with memory for the past 2 years.  She has known patient for more than 30 years.  Memory deficits started by patient repeating herself, being forgetful asking the same questions over and over.  She also mismanaged her money.  She sold her house and her financial advisor set her up for the next 10 years but she mismanaged the money and ran out of money in 4 years.  Currently she is unable to pay her own rent.  She cannot explain how or where the money is gone.  On top of that she forgets to take her medications, there are bottles of medications all over her apartment and she is also forgetting doctors appointments.  There is also concern for patient on self-care.  Currently she does live in independent living facility and Alfonso is trying to take her to a assisted living facility.  TBI:   No past history of TBI Stroke:   no past history of stroke Seizures:   no past history of seizures Sleep:  Yes, uses a CPAP machine.  Mood:  patient denies anxiety and depression  Functional status: dependent in some ADLs and IADLs Patient lives independent living.  Cooking: Meals are provided Cleaning: No Shopping: Yes  Bathing: self  Toileting: self Driving: Not driving  Bills: Need helps with her bills, cannot manage her own affair anymore   Ever left the stove on by accident?: Does not cook  Forget how  to use items around the house?: No  Getting lost going to familiar places?: No  Forgetting loved ones names?: Yes  Word finding difficulty? No  Sleep: 6 to 7 hrs    OTHER MEDICAL CONDITIONS: Hypertension, hyperlipidemia, diabetes mellitus type 2, overactive bladder   REVIEW OF SYSTEMS: Full 14 system review of systems performed and negative with exception of: as noted in the HPI   ALLERGIES: Allergies  Allergen Reactions   Apraclonidine  Hcl Other (See Comments)    Unknown reaction Pt does not recall **not listed on the MAR**   Penicillins  Swelling and Other (See Comments)    Has patient had a PCN reaction causing immediate rash, facial/tongue/throat swelling, SOB or lightheadedness with hypotension: YES Has patient had a PCN reaction causing severe rash involving mucus membranes or skin necrosis: NO Has patient had a PCN reaction that required hospitalization: NO Has patient had a PCN reaction occurring within the last 10 years: NO If all of the above answers are NO, then may proceed with Cephalosporin use.    Sulfa Antibiotics Rash    HOME MEDICATIONS: Outpatient Medications Prior to Visit  Medication Sig Dispense Refill   acetaminophen  (TYLENOL ) 500 MG tablet Take 1,000 mg by mouth in the morning, at noon, and at bedtime.     alendronate (FOSAMAX) 70 MG tablet Take 70 mg by mouth every Wednesday.     alum & mag hydroxide-simeth (MAALOX PLUS) 400-400-40 MG/5ML suspension Take 15 mLs by mouth every 8 (eight) hours as needed for indigestion.     aspirin  EC 81 MG tablet Take 1 tablet (81 mg total) by mouth daily. 21 tablet 0   atorvastatin  (LIPITOR) 10 MG tablet Take 10 mg by mouth at bedtime.     calcium  carbonate (TUMS - DOSED IN MG ELEMENTAL CALCIUM ) 500 MG chewable tablet Chew 1,000 mg by mouth 2 (two) times daily.     Cholecalciferol (VITAMIN D3 MAXIMUM STRENGTH) 125 MCG (5000 UT) capsule Take 5,000 Units by mouth daily.     clopidogrel (PLAVIX) 75 MG tablet Take 1 tablet (75 mg total) by mouth daily. 90 tablet 0   cyanocobalamin  (VITAMIN B12) 1000 MCG tablet Take 1 tablet (1,000 mcg total) by mouth daily. 90 tablet 2   DULoxetine (CYMBALTA) 60 MG capsule Take 60 mg by mouth daily.     enalapril  (VASOTEC ) 10 MG tablet Take 10 mg by mouth 2 (two) times daily.     feeding supplement, GLUCERNA SHAKE, (GLUCERNA SHAKE) LIQD Take 237 mLs by mouth 2 (two) times daily between meals.     fluticasone (FLONASE) 50 MCG/ACT nasal spray Place 1 spray into both nostrils daily.     furosemide  (LASIX ) 20 MG tablet Take 20 mg by mouth  every other day.     latanoprost  (XALATAN ) 0.005 % ophthalmic solution Place 1 drop into the right eye at bedtime.     Lidocaine  HCl 4 % PTCH Apply 2 patches topically daily. Apply to lower back     loratadine (CLARITIN) 10 MG tablet Take 10 mg by mouth daily.     Menthol , Topical Analgesic, (BIOFREEZE) 10 % CREA Apply 1 Application topically in the morning and at bedtime. Apply to wrist and any other affected areas     metFORMIN  (GLUCOPHAGE -XR) 500 MG 24 hr tablet Take 1,000 mg by mouth 2 (two) times daily with a meal.     metoprolol  tartrate (LOPRESSOR ) 25 MG tablet Take 0.5 tablets (12.5 mg total) by mouth 2 (two) times daily.  Multiple Vitamins-Minerals (MULTIVITAMIN WITH MINERALS) tablet Take 1 tablet by mouth daily.     pantoprazole (PROTONIX) 40 MG tablet Take 40 mg by mouth daily.     pilocarpine  (PILOCAR) 1 % ophthalmic solution Place 1 drop into the right eye 4 (four) times daily.     sitaGLIPtin (JANUVIA) 100 MG tablet Take 100 mg by mouth daily.     tamsulosin  (FLOMAX ) 0.4 MG CAPS capsule TAKE 1 CAPSULE BY MOUTH EVERY DAY 30 capsule 3   timolol  (TIMOPTIC ) 0.5 % ophthalmic solution Place 1 drop into the right eye 2 (two) times daily.     solifenacin (VESICARE) 10 MG tablet Take 10 mg by mouth daily. (Patient not taking: Reported on 02/11/2024)     rivastigmine  (EXELON ) 4.5 MG capsule Take 1 capsule (4.5 mg total) by mouth 2 (two) times daily. 60 capsule 11   No facility-administered medications prior to visit.    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   Bronchitis    GERD (gastroesophageal reflux disease)    Glaucoma, right eye    CLOSED ANGLE   History of kidney stones    Hypertension    Nausea and vomiting 12/29/2023   OA (osteoarthritis)    KNEES , HANDS   OAB (overactive bladder)    OSA on CPAP    Osteoporosis    Type 2 diabetes mellitus (HCC)    FOLLOWED BY PCP   Wears glasses     PAST SURGICAL  HISTORY: Past Surgical History:  Procedure Laterality Date   CHOLECYSTECTOMY     CYST EXCISION  08/2017   POSTERIOR NECK   CYSTOSCOPY WITH RETROGRADE PYELOGRAM, URETEROSCOPY AND STENT PLACEMENT Left 06/16/2018   Procedure: diagnostic URETEROSCOPY AND STENT PLACEMENT;  Surgeon: Sherrilee Belvie CROME, MD;  Location: [facility];  Service: Urology;  Laterality: Left;  1 HR   EXCISION MASS UPPER EXTREMETIES Right 11/15/2021   Procedure: EXCISION OF SUBCUTANEOUS MASS RIGHT SHOULDER;  Surgeon: Signe Mitzie LABOR, MD;  Location: [facility];  Service: General;  Laterality: Right;   EYE SURGERY Left 1992   REMOVE EYE AND PLACEMENT PROSTHESIS   KNEE ARTHROPLASTY Right 12/12/2017   Procedure: RIGHT TOTAL KNEE ARTHROPLASTY WITH COMPUTER NAVIGATION;  Surgeon: Fidel Rogue, MD;  Location: [facility];  Service: Orthopedics;  Laterality: Right;  Needs RNFA   TONSILLECTOMY  AGE 55    FAMILY HISTORY: Family History  Problem Relation Age of Onset   Cancer Mother    Cancer Father    Breast cancer Maternal Aunt     SOCIAL HISTORY: Social History   Socioeconomic History   Marital status: Single    Spouse name: Not on file   Number of children: Not on file   Years of education: Not on file   Highest education level: Not on file  Occupational History   Not on file  Tobacco Use   Smoking status: Never   Smokeless tobacco: Never  Vaping Use   Vaping status: Never Used  Substance and Sexual Activity   Alcohol  use: Never   Drug use: Never   Sexual activity: Yes    Birth control/protection: Post-menopausal  Other Topics Concern   Not on file  Social History Narrative   Not on file   Social Drivers of Health   Financial Resource Strain: Not on file  Food Insecurity: No Food Insecurity (12/29/2023)   Hunger Vital Sign    Worried About Running Out of  Food in the Last Year: Never true    Ran Out of Food in the Last Year: Never true  Transportation Needs: No Transportation Needs (12/29/2023)   PRAPARE -  Administrator, Civil Service (Medical): No    Lack of Transportation (Non-Medical): No  Physical Activity: Not on file  Stress: Not on file  Social Connections: Socially Isolated (12/29/2023)   Social Connection and Isolation Panel    Frequency of Communication with Friends and Family: Once a week    Frequency of Social Gatherings with Friends and Family: Once a week    Attends Religious Services: More than 4 times per year    Active Member of [employer] or Organizations: No    Attends Banker Meetings: Never    Marital Status: Never married  Intimate Partner Violence: Not At Risk (12/29/2023)   Humiliation, Afraid, Rape, and Kick questionnaire    Fear of Current or Ex-Partner: No    Emotionally Abused: No    Physically Abused: No    Sexually Abused: No    PHYSICAL EXAM  GENERAL EXAM/CONSTITUTIONAL: Vitals:  Vitals:   02/11/24 1352  BP: 130/82  Pulse: 75  SpO2: 97%     There is no height or weight on file to calculate BMI. Wt Readings from Last 3 Encounters:  12/29/23 146 lb 6.2 oz (66.4 kg)  02/11/23 170 lb (77.1 kg)  11/07/21 167 lb (75.8 kg)   Patient is in no distress; well developed, nourished and groomed; neck is supple  MUSCULOSKELETAL: Gait, strength, tone, movements noted in Neurologic exam below  NEUROLOGIC: MENTAL STATUS:      No data to display            02/11/2024    1:54 PM 02/11/2023    2:49 PM 02/05/2022    2:12 PM 08/01/2021    3:11 PM  Montreal Cognitive Assessment   Visuospatial/ Executive (0/5) 2 2 3 3   Naming (0/3) 2 3 2 3   Attention: Read list of digits (0/2) 1 2 2 1   Attention: Read list of letters (0/1) 0 1 1 1   Attention: Serial 7 subtraction starting at 100 (0/3) 0 1 2 1   Language: Repeat phrase (0/2) 1 0 1 1  Language : Fluency (0/1) 0 1 0 0  Abstraction (0/2) 1 0 1 1  Delayed Recall (0/5) 0 5 4 3   Orientation (0/6) 4 6 6 5   Total 11 21 22 19   Adjusted Score (based on education)   23 20    CRANIAL  NERVE:  5th - facial sensation symmetric 7th - facial strength symmetric 8th - hearing intact 9th - palate elevates symmetrically, uvula midline 11th - shoulder shrug symmetric 12th - tongue protrusion midline  MOTOR:  normal bulk and tone, at least antigravity. No resting or action tremors noted   SENSORY:  normal and symmetric to light touch  GAIT/STATION:  Deferred, in a wheelchair    DIAGNOSTIC DATA (LABS, IMAGING, TESTING) - I reviewed patient records, labs, notes, testing and imaging myself where available.  Lab Results  Component Value Date   WBC 7.2 12/28/2023   HGB 12.9 12/28/2023   HCT 38.0 12/28/2023   MCV 91.4 12/28/2023   PLT 153 12/28/2023      Component Value Date/Time   NA 132 (L) 12/29/2023 0424   K 4.0 12/29/2023 0424   CL 98 12/29/2023 0424   CO2 20 (L) 12/29/2023 0424   GLUCOSE 126 (H) 12/29/2023 0424   BUN 9  12/29/2023 0424   CREATININE 0.72 12/29/2023 0424   CALCIUM  8.8 (L) 12/29/2023 0424   PROT 6.1 (L) 12/28/2023 2112   ALBUMIN  3.5 12/28/2023 2112   AST 24 12/28/2023 2112   ALT 15 12/28/2023 2112   ALKPHOS 54 12/28/2023 2112   BILITOT 0.8 12/28/2023 2112   GFRNONAA >60 12/29/2023 0424   GFRAA >60 03/10/2019 0245   Lab Results  Component Value Date   CHOL 94 12/29/2023   HDL 33 (L) 12/29/2023   LDLCALC 46 12/29/2023   TRIG 77 12/29/2023   CHOLHDL 2.8 12/29/2023   Lab Results  Component Value Date   HGBA1C 5.2 12/29/2023   Lab Results  Component Value Date   VITAMINB12 309 08/01/2021   Lab Results  Component Value Date   TSH 2.890 08/01/2021   Head CT 01/09/2022 No CT evidence of intracranial injury.   ASSESSMENT AND PLAN  78 y.o. year old female with hypertension, hyperlipidemia, diabetes mellitus who is presenting for Demential follow up.  Overall she is stable, there is no report of worsening memory, she is still a resident of [facility] where she enjoys the facility, doing physical therapy.  She tolerating the medication  very well.  Her MoCA score today is 11 compared to 22 last year.  Plan will be to continue with Exelon  4.5 mg twice daily but I will add Namenda 10 mg twice daily.   We will repeat her urine analysis and culture today.  I will see her in 1 year for follow-up or sooner if worse    1. Mild late onset Alzheimer's dementia without behavioral disturbance, psychotic disturbance, mood disturbance, or anxiety (HCC)     Patient Instructions  Continue with Exelon  4.5 mg twice daily  Start Namenda 10 mg twice daily  Will recheck urinalysis and urine culture today Continue with your other medications Continue follow-up with doctors Return in a year or sooner if worse  Orders Placed This Encounter  Procedures   Urinalysis with Reflex Microscopic    Meds ordered this encounter  Medications   memantine (NAMENDA) 10 MG tablet    Sig: Take 1 tablet (10 mg total) by mouth 2 (two) times daily.    Dispense:  180 tablet    Refill:  3   rivastigmine  (EXELON ) 4.5 MG capsule    Sig: Take 1 capsule (4.5 mg total) by mouth 2 (two) times daily.    Dispense:  720 capsule    Refill:  0    Return in about 1 year (around 02/10/2025).  I personally spent a total of 40 minutes in the care of the patient today including preparing to see the patient, getting/reviewing separately obtained history, performing a medically appropriate exam/evaluation, counseling and educating, placing orders, and documenting clinical information in the EHR.   Pastor Falling, MD 02/11/2024, 4:57 PM  [facility] [address] [place], [place] [address] [phone]

## 2024-02-11 NOTE — Patient Instructions (Addendum)
 Continue with Exelon  4.5 mg twice daily  Start Namenda 10 mg twice daily  Will recheck urinalysis and urine culture today Continue with your other medications Continue follow-up with doctors Return in a year or sooner if worse

## 2025-02-10 ENCOUNTER — Ambulatory Visit: Admitting: Neurology
